# Patient Record
Sex: Male | Born: 1970 | Race: White | Hispanic: No | Marital: Single | Smoking: Current every day smoker
Health system: Southern US, Community
[De-identification: ages and names within clinical notes are randomized; demographics above are authoritative.]

## PROBLEM LIST (undated history)

## (undated) VITALS — BP 95/66 | HR 97 | Temp 97.3°F | Resp 16 | Ht 65.0 in | Wt 171.0 lb

## (undated) VITALS — BP 110/77 | HR 77 | Temp 98.2°F | Resp 16 | Ht 67.0 in | Wt 150.0 lb

## (undated) DIAGNOSIS — E349 Endocrine disorder, unspecified: Secondary | ICD-10-CM

## (undated) DIAGNOSIS — L03119 Cellulitis of unspecified part of limb: Secondary | ICD-10-CM

## (undated) DIAGNOSIS — F329 Major depressive disorder, single episode, unspecified: Secondary | ICD-10-CM

## (undated) DIAGNOSIS — F32A Depression, unspecified: Secondary | ICD-10-CM

## (undated) DIAGNOSIS — B1920 Unspecified viral hepatitis C without hepatic coma: Secondary | ICD-10-CM

## (undated) DIAGNOSIS — F640 Transsexualism: Secondary | ICD-10-CM

## (undated) DIAGNOSIS — L02419 Cutaneous abscess of limb, unspecified: Secondary | ICD-10-CM

---

## 1999-12-03 ENCOUNTER — Emergency Department (HOSPITAL_COMMUNITY): Admission: EM | Admit: 1999-12-03 | Discharge: 1999-12-03 | Payer: Self-pay | Admitting: Emergency Medicine

## 2006-01-12 ENCOUNTER — Ambulatory Visit: Payer: Self-pay | Admitting: *Deleted

## 2006-01-12 ENCOUNTER — Inpatient Hospital Stay (HOSPITAL_COMMUNITY): Admission: EM | Admit: 2006-01-12 | Discharge: 2006-01-18 | Payer: Self-pay | Admitting: *Deleted

## 2006-02-24 ENCOUNTER — Ambulatory Visit (HOSPITAL_COMMUNITY): Payer: Self-pay | Admitting: Psychiatry

## 2006-03-31 ENCOUNTER — Ambulatory Visit (HOSPITAL_COMMUNITY): Payer: Self-pay | Admitting: Psychiatry

## 2006-04-28 ENCOUNTER — Ambulatory Visit (HOSPITAL_COMMUNITY): Payer: Self-pay | Admitting: Psychiatry

## 2006-05-19 ENCOUNTER — Ambulatory Visit (HOSPITAL_COMMUNITY): Payer: Self-pay | Admitting: Psychiatry

## 2006-06-02 ENCOUNTER — Ambulatory Visit (HOSPITAL_COMMUNITY): Payer: Self-pay | Admitting: Psychiatry

## 2006-06-16 ENCOUNTER — Ambulatory Visit (HOSPITAL_COMMUNITY): Payer: Self-pay | Admitting: Psychiatry

## 2006-06-19 ENCOUNTER — Emergency Department (HOSPITAL_COMMUNITY): Admission: EM | Admit: 2006-06-19 | Discharge: 2006-06-19 | Payer: Self-pay | Admitting: Emergency Medicine

## 2006-06-23 ENCOUNTER — Ambulatory Visit (HOSPITAL_COMMUNITY): Payer: Self-pay | Admitting: Psychiatry

## 2006-07-01 ENCOUNTER — Inpatient Hospital Stay (HOSPITAL_COMMUNITY): Admission: AD | Admit: 2006-07-01 | Discharge: 2006-07-09 | Payer: Self-pay | Admitting: Psychiatry

## 2006-07-01 ENCOUNTER — Ambulatory Visit: Payer: Self-pay | Admitting: Psychiatry

## 2006-07-12 ENCOUNTER — Emergency Department (HOSPITAL_COMMUNITY): Admission: EM | Admit: 2006-07-12 | Discharge: 2006-07-13 | Payer: Self-pay | Admitting: Emergency Medicine

## 2006-07-14 ENCOUNTER — Ambulatory Visit (HOSPITAL_COMMUNITY): Payer: Self-pay | Admitting: Psychiatry

## 2006-07-28 ENCOUNTER — Ambulatory Visit (HOSPITAL_COMMUNITY): Payer: Self-pay | Admitting: Psychiatry

## 2006-09-01 ENCOUNTER — Ambulatory Visit (HOSPITAL_COMMUNITY): Payer: Self-pay | Admitting: Psychiatry

## 2006-12-08 ENCOUNTER — Ambulatory Visit (HOSPITAL_COMMUNITY): Payer: Self-pay | Admitting: Psychiatry

## 2007-02-23 ENCOUNTER — Ambulatory Visit (HOSPITAL_COMMUNITY): Payer: Self-pay | Admitting: Psychiatry

## 2007-03-16 ENCOUNTER — Ambulatory Visit (HOSPITAL_COMMUNITY): Payer: Self-pay | Admitting: Psychiatry

## 2007-04-09 ENCOUNTER — Inpatient Hospital Stay (HOSPITAL_COMMUNITY): Admission: EM | Admit: 2007-04-09 | Discharge: 2007-04-14 | Payer: Self-pay | Admitting: Psychiatry

## 2007-04-11 ENCOUNTER — Ambulatory Visit: Payer: Self-pay | Admitting: Psychiatry

## 2007-07-06 ENCOUNTER — Ambulatory Visit (HOSPITAL_COMMUNITY): Payer: Self-pay | Admitting: Psychiatry

## 2007-09-07 ENCOUNTER — Ambulatory Visit (HOSPITAL_COMMUNITY): Payer: Self-pay | Admitting: Psychiatry

## 2007-10-26 ENCOUNTER — Ambulatory Visit (HOSPITAL_COMMUNITY): Payer: Self-pay | Admitting: Psychiatry

## 2007-11-04 ENCOUNTER — Emergency Department (HOSPITAL_COMMUNITY): Admission: EM | Admit: 2007-11-04 | Discharge: 2007-11-05 | Payer: Self-pay | Admitting: Emergency Medicine

## 2008-02-22 ENCOUNTER — Ambulatory Visit (HOSPITAL_COMMUNITY): Payer: Self-pay | Admitting: Psychiatry

## 2008-05-16 ENCOUNTER — Ambulatory Visit (HOSPITAL_COMMUNITY): Payer: Self-pay | Admitting: Psychiatry

## 2008-07-18 ENCOUNTER — Ambulatory Visit (HOSPITAL_COMMUNITY): Payer: Self-pay | Admitting: Psychiatry

## 2008-09-12 ENCOUNTER — Ambulatory Visit (HOSPITAL_COMMUNITY): Payer: Self-pay | Admitting: Psychiatry

## 2008-11-16 ENCOUNTER — Emergency Department (HOSPITAL_COMMUNITY): Admission: EM | Admit: 2008-11-16 | Discharge: 2008-11-17 | Payer: Self-pay | Admitting: Emergency Medicine

## 2008-12-12 ENCOUNTER — Ambulatory Visit (HOSPITAL_COMMUNITY): Payer: Self-pay | Admitting: Psychiatry

## 2009-05-29 ENCOUNTER — Ambulatory Visit (HOSPITAL_COMMUNITY): Payer: Self-pay | Admitting: Psychiatry

## 2009-07-06 ENCOUNTER — Emergency Department (HOSPITAL_COMMUNITY)
Admission: EM | Admit: 2009-07-06 | Discharge: 2009-07-07 | Payer: Self-pay | Source: Home / Self Care | Admitting: Emergency Medicine

## 2009-08-03 ENCOUNTER — Emergency Department (HOSPITAL_COMMUNITY): Admission: EM | Admit: 2009-08-03 | Discharge: 2009-08-03 | Payer: Self-pay | Admitting: Family Medicine

## 2009-09-11 ENCOUNTER — Ambulatory Visit (HOSPITAL_COMMUNITY): Payer: Self-pay | Admitting: Psychiatry

## 2009-09-20 ENCOUNTER — Ambulatory Visit (HOSPITAL_COMMUNITY): Admission: RE | Admit: 2009-09-20 | Discharge: 2009-09-20 | Payer: Self-pay | Admitting: Psychiatry

## 2009-09-20 ENCOUNTER — Other Ambulatory Visit: Payer: Self-pay | Admitting: Emergency Medicine

## 2009-09-23 ENCOUNTER — Ambulatory Visit: Payer: Self-pay | Admitting: Psychiatry

## 2009-09-23 ENCOUNTER — Inpatient Hospital Stay (HOSPITAL_COMMUNITY)
Admission: AD | Admit: 2009-09-23 | Discharge: 2009-09-26 | Payer: Self-pay | Source: Home / Self Care | Admitting: Psychiatry

## 2009-10-09 ENCOUNTER — Ambulatory Visit (HOSPITAL_COMMUNITY): Payer: Self-pay | Admitting: Psychiatry

## 2009-11-14 ENCOUNTER — Other Ambulatory Visit: Payer: Self-pay

## 2009-11-14 ENCOUNTER — Inpatient Hospital Stay (HOSPITAL_COMMUNITY): Admission: RE | Admit: 2009-11-14 | Discharge: 2009-11-18 | Payer: Self-pay | Admitting: Psychiatry

## 2010-07-27 LAB — CBC
MCV: 93.9 fL (ref 78.0–100.0)
Platelets: 177 10*3/uL (ref 150–400)
RBC: 4.41 MIL/uL (ref 3.87–5.11)
RDW: 13.9 % (ref 11.5–15.5)
WBC: 10.3 10*3/uL (ref 4.0–10.5)

## 2010-07-27 LAB — COMPREHENSIVE METABOLIC PANEL
BUN: 7 mg/dL (ref 6–23)
CO2: 24 mEq/L (ref 19–32)
Calcium: 9 mg/dL (ref 8.4–10.5)
Creatinine, Ser: 1.08 mg/dL (ref 0.4–1.2)
GFR calc Af Amer: >60 mL/min (ref >60)
GFR calc non Af Amer: >60 mL/min (ref >60)
Glucose, Bld: 101 mg/dL — ABNORMAL HIGH (ref 70–99)
Sodium: 140 mEq/L (ref 135–145)

## 2010-07-27 LAB — DIFFERENTIAL
Eosinophils Absolute: 0.2 10*3/uL (ref 0.0–0.7)
Lymphocytes Relative: 22 % (ref 12–46)
Lymphs Abs: 2.2 10*3/uL (ref 0.7–4.0)
Neutro Abs: 7.6 10*3/uL (ref 1.7–7.7)
Neutrophils Relative %: 74 % (ref 43–77)

## 2010-07-27 LAB — RAPID URINE DRUG SCREEN, HOSP PERFORMED
Amphetamines: NOT DETECTED
Barbiturates: NOT DETECTED
Tetrahydrocannabinol: NOT DETECTED

## 2010-07-27 LAB — ETHANOL: Alcohol, Ethyl (B): <5 mg/dL (ref 0–10)

## 2010-07-29 LAB — BASIC METABOLIC PANEL
GFR calc non Af Amer: >60 mL/min (ref >60)
Glucose, Bld: 103 mg/dL — ABNORMAL HIGH (ref 70–99)
Potassium: 4.1 mEq/L (ref 3.5–5.1)
Sodium: 142 mEq/L (ref 135–145)

## 2010-07-29 LAB — URINALYSIS, ROUTINE W REFLEX MICROSCOPIC
Bilirubin Urine: NEGATIVE
Hgb urine dipstick: NEGATIVE
Specific Gravity, Urine: 1.016 (ref 1.005–1.030)
Urobilinogen, UA: 0.2 mg/dL (ref 0.0–1.0)

## 2010-07-29 LAB — DIFFERENTIAL
Eosinophils Relative: 2 % (ref 0–5)
Lymphocytes Relative: 26 % (ref 12–46)
Lymphs Abs: 2.1 10*3/uL (ref 0.7–4.0)
Monocytes Absolute: 0.4 10*3/uL (ref 0.1–1.0)
Monocytes Relative: 4 % (ref 3–12)

## 2010-07-29 LAB — ETHANOL: Alcohol, Ethyl (B): <5 mg/dL (ref 0–10)

## 2010-07-29 LAB — URINE MICROSCOPIC-ADD ON

## 2010-07-29 LAB — CBC
HCT: 43.2 % (ref 36.0–46.0)
Hemoglobin: 14.4 g/dL (ref 12.0–15.0)
RBC: 4.57 MIL/uL (ref 3.87–5.11)
WBC: 8.3 10*3/uL (ref 4.0–10.5)

## 2010-07-29 LAB — RAPID URINE DRUG SCREEN, HOSP PERFORMED
Amphetamines: NOT DETECTED
Barbiturates: NOT DETECTED
Opiates: NOT DETECTED

## 2010-08-01 LAB — HERPES SIMPLEX VIRUS CULTURE: Culture: NOT DETECTED

## 2010-08-01 LAB — GC/CHLAMYDIA PROBE AMP, URINE
Chlamydia, Swab/Urine, PCR: NEGATIVE
GC Probe Amp, Urine: NEGATIVE

## 2010-08-17 LAB — POCT I-STAT, CHEM 8
BUN: 8 mg/dL (ref 6–23)
HCT: 49 % — ABNORMAL HIGH (ref 36.0–46.0)
Sodium: 141 mEq/L (ref 135–145)
TCO2: 23 mmol/L (ref 0–100)

## 2010-08-17 LAB — DIFFERENTIAL
Basophils Absolute: 0.3 10*3/uL — ABNORMAL HIGH (ref 0.0–0.1)
Basophils Relative: 2 % — ABNORMAL HIGH (ref 0–1)
Eosinophils Relative: 0 % (ref 0–5)
Lymphocytes Relative: 8 % — ABNORMAL LOW (ref 12–46)
Neutro Abs: 16.4 10*3/uL — ABNORMAL HIGH (ref 1.7–7.7)

## 2010-08-17 LAB — RAPID URINE DRUG SCREEN, HOSP PERFORMED
Amphetamines: NOT DETECTED
Barbiturates: NOT DETECTED
Opiates: NOT DETECTED

## 2010-08-17 LAB — CBC
Platelets: 162 10*3/uL (ref 150–400)
RDW: 13.9 % (ref 11.5–15.5)

## 2010-09-23 NOTE — H&P (Signed)
Randy [name], Randy [name]                 ACCOUNT NO.:  [id number]   MEDICAL RECORD NO.:  [id number]          PATIENT TYPE:  IPS   LOCATION:  0407                          FACILITY:  [facility]   PHYSICIAN:  Anselm Jungling, MD  DATE OF BIRTH:  [birth year]   DATE OF ADMISSION:  04/09/2007  DATE OF DISCHARGE:                       PSYCHIATRIC ADMISSION ASSESSMENT   IDENTIFYING INFORMATION:  This is a voluntary admission to the services  of Dr. Geralyn Flash.  This is a 40 year old transgendered male.  Jurgen  presented as a walk-in yesterday.  Jie reported having come off  medications at least 2 weeks ago, except for Wellbutrin, on her own, due  to financial reasons.  Her father died this past 2022/10/16.  She came back  to [place] last night.  She reported having suicidal ideation with  plans to cut herself.  She had come home from visiting family for the  holiday and then became upset about family conflict.  She states that  she is also off of her estradiol.  She is followed by Dr. Elizabeth Sauer  in [place].  She reports having relapsed on drugs and alcohol after she  came back to town.  There is an upcoming memorial service this coming  Saturday in [place] for her father, however she has not been offered a  plane ticket.  She is pretty sure she will not be attending.  Marland Kitchen   PAST PSYCHIATRIC HISTORY:  The patient began psychiatric care around  40 as an inpatient in [place], [place], also that was her first  time at [facility] on the then-5000 unit.  She was also a patient in  1998 at [facility], 1999 at [facility], and she has had several  admissions this past year at [facility].  She  is followed as an outpatient by Dr. Geoffery Lyons.   SOCIAL HISTORY:  She reports having started SSDI in 2001.  She received  her B.S. from [employer] in 2006.  She has never married, has no children.  She is in a subsidized apartment and is on the Section 8 waiting list.   FAMILY  HISTORY:  She states her father had a lung transplant last year  and unfortunately he has just passed away.   MEDICAL HISTORY AND PRIMARY CARE Jeanne Diefendorf:  She was under hormonal  therapy for transgender with Dr. Elizabeth Sauer.  She states that she  stopped this a couple of months ago, and she also made mention of the  fact that Medicare cannot pay for the surgery.  Medical problems:  There  are no known medical problems.   MEDICATIONS:  She was prescribed Wellbutrin XL 450 mg p.o. daily,  Lexapro 30 mg p.o. daily, Abilify 5 mg p.o. daily, and Klonopin 0.75 mg  p.o. b.i.d.   ALLERGIES:  PENICILLIN.   POSITIVE PHYSICAL FINDINGS:  PHYSICAL EXAMINATION:  She has very thin  hair, although it is long, and her features are losing their  esterification.  Vital signs on admission show she is 66 inches tall and  weighs 192, temperature is 97.6, blood pressure is  117/79 to 116/74.  Pulse is 78-90, respirations are 20.  Unfortunately, labs are pending.  I do not have those results yet.   MENTAL STATUS EXAM:  She was easily aroused and brought down to the exam  room.  She is casually groomed and dressed, appears to be adequately  nourished.  Her speech is slow, it is a normal rate and volume.  Mood is  very depressed, affect is flat.  Thought processes are slow, poor eye  contact, did not seem to be having thought blocking however.  Judgment  and insight are fair.  Concentration and memory are fair.  Intelligence  is at least average.  She  is passively suicidal.  She is not sure if  she would be safe if outside of the current environment.  She is not  homicidal, does not have any auditory or visual hallucinations.   ADMISSION DIAGNOSES:  AXIS I:  Major depressive disorder, recurrent,  severe due to noncompliance with medications, relapse on alcohol and  crack cocaine.  AXIS II:  Deferred.  AXIS III:  Transgender process has been stopped, has discontinued  estradiol.  AXIS IV:  Problems with  primary support group, housing, economic issues.  AXIS V:  Global assessment of function is 30.   PLAN:  Plan is to admit for safety and stabilization, to restart her  medications.  We will get her hooked up with [facility] to ensure compliance, and also to help her with financial  planning, etc.   ESTIMATED LENGTH OF STAY:  3-4 days.      Mickie Leonarda Salon, P.A.-C.      Anselm Jungling, MD  Electronically Signed    MD/MEDQ  D:  04/10/2007  T:  04/10/2007  Job:  [id number]
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
## 2010-09-26 NOTE — Discharge Summary (Signed)
Randy [name], Randy [name]                 ACCOUNT NO.:  [id number]   MEDICAL RECORD NO.:  [id number]          PATIENT TYPE:  IPS   LOCATION:  0505                          FACILITY:  [facility]   PHYSICIAN:  Anselm Jungling, MD  DATE OF BIRTH:  06-21-1970   DATE OF ADMISSION:  04/09/2007  DATE OF DISCHARGE:  04/14/2007                               DISCHARGE SUMMARY   IDENTIFYING DATA AND REASON FOR ADMISSION:  This was an inpatient  psychiatric admission for Randy [name], a 40 year old transgender, male to  male.  She presented as a walk-in, and reported having come off of her  psychotropic medications 2 weeks prior to admission due to financial  reasons.  Also her father had died in the past week.  She reported  suicidal ideation.  Please refer to the admission note for further  details pertaining to the symptoms, circumstances and history that led  to her hospitalization.  She was given initial Axis I diagnoses of major  depressive disorder, recurrent, severe, alcohol and cocaine abuse, and  bereavement.   MEDICAL AND LABORATORY:  The patient came to [place] with a history of no  known medical problems.  She was undergoing hormonal therapy for  transgender transformation, under the care of Dr. Yetta Barre.   HOSPITAL COURSE:  The patient was admitted to the adult inpatient  psychiatric service.  She presented as a well-nourished, well-developed  individual, with overall male gestalt.  She was alert, fully oriented,  pleasant and cooperative.  Her mood was depressed with sad affect.  Her  thoughts and speech were normally organized.  There was nothing to  suggest any underlying formal thought disorder or psychosis.   The patient was initially seen by Dr. Milford Cage.  The undersigned  assumed her care on the fourth hospital day.  On that date, I met with  the patient individually and reviewed the chart.  At time she appeared  tired, disheveled, but had no new or specific complaints.  She was still  quite depressed, but made no suicidal statements.  She had not been  participating very well in the program.   The following day, however, she reported that she was feeling a lot  better.  She was being treated with a regimen of Lexapro, Abilify,  Wellbutrin XL, and Klonopin.  As referenced above, she had stopped her  psychotropic regimen  approximately 2 weeks prior to admission due to  financial reasons.   On the sixth hospital day, the patient requested discharge.  She was  visibly less depressed, was smiling appropriately, and much more  hopeful.  She had indicated that she did not have anyone in her life to  invite in for a family support meeting.  She agreed to the following  aftercare plan.   AFTERCARE:  The patient was to follow-up at the [facility] outpatient  clinic with an appointment on January 7.   DISCHARGE MEDICATIONS:  Lexapro 30 mg daily, Abilify 5 mg daily,  Wellbutrin XL 450 mg daily, Klonopin 0.5 mg, 1-1/2 tablets b.i.d., and  Tylenol 650 mg q.4 h as needed  for pain.  The patient was also referred  to [facility] for individual psychotherapy and given the number of the  [facility] for an additional mental health services.   DISCHARGE DIAGNOSES:  AXIS I: Bipolar disorder, type 2, most recently  depressed without psychotic features, history of alcohol and cocaine  abuse, and bereavement.  AXIS II: Deferred.  AXIS III: No acute or chronic illnesses, transgender transformative  process, medical in progress.  AXIS IV: Stressors severe.  AXIS V: GAF on discharge 55.      Anselm Jungling, MD  Electronically Signed     SPB/MEDQ  D:  04/28/2007  T:  04/29/2007  Job:  [id number]

## 2011-02-17 LAB — CBC
HCT: 46.6 — ABNORMAL HIGH
Hemoglobin: 16.3 — ABNORMAL HIGH
MCHC: 35
MCV: 93.9
RDW: 13.9

## 2011-02-17 LAB — COMPREHENSIVE METABOLIC PANEL
BUN: 16
CO2: 23
Calcium: 9.2
Chloride: 108
Creatinine, Ser: 1.17
GFR calc Af Amer: >60
GFR calc non Af Amer: 53 — ABNORMAL LOW
Glucose, Bld: 90
Total Bilirubin: 0.8

## 2011-05-24 ENCOUNTER — Ambulatory Visit (HOSPITAL_COMMUNITY)
Admission: RE | Admit: 2011-05-24 | Discharge: 2011-05-24 | Disposition: A | Discharge status: Home / Self Care | Payer: Medicare Other | Source: Ambulatory Visit | Attending: Psychiatry | Admitting: Psychiatry

## 2011-05-24 ENCOUNTER — Encounter (HOSPITAL_COMMUNITY): Payer: Self-pay | Admitting: Licensed Clinical Social Worker

## 2011-05-24 ENCOUNTER — Emergency Department (HOSPITAL_COMMUNITY)
Admission: EM | Admit: 2011-05-24 | Discharge: 2011-05-26 | Disposition: A | Discharge status: Other Acute Inpatient Hospital | Payer: Medicare Other | Attending: Internal Medicine | Admitting: Internal Medicine

## 2011-05-24 DIAGNOSIS — M7989 Other specified soft tissue disorders: Secondary | ICD-10-CM | POA: Diagnosis not present

## 2011-05-24 DIAGNOSIS — R45851 Suicidal ideations: Secondary | ICD-10-CM | POA: Diagnosis not present

## 2011-05-24 DIAGNOSIS — F1210 Cannabis abuse, uncomplicated: Secondary | ICD-10-CM | POA: Insufficient documentation

## 2011-05-24 DIAGNOSIS — F1010 Alcohol abuse, uncomplicated: Secondary | ICD-10-CM | POA: Insufficient documentation

## 2011-05-24 DIAGNOSIS — F329 Major depressive disorder, single episode, unspecified: Secondary | ICD-10-CM | POA: Insufficient documentation

## 2011-05-24 DIAGNOSIS — Z79899 Other long term (current) drug therapy: Secondary | ICD-10-CM | POA: Insufficient documentation

## 2011-05-24 DIAGNOSIS — F1410 Cocaine abuse, uncomplicated: Secondary | ICD-10-CM | POA: Insufficient documentation

## 2011-05-24 DIAGNOSIS — M79609 Pain in unspecified limb: Secondary | ICD-10-CM | POA: Diagnosis not present

## 2011-05-24 DIAGNOSIS — S9030XA Contusion of unspecified foot, initial encounter: Secondary | ICD-10-CM | POA: Diagnosis not present

## 2011-05-24 DIAGNOSIS — F3289 Other specified depressive episodes: Secondary | ICD-10-CM | POA: Insufficient documentation

## 2011-05-24 DIAGNOSIS — F17200 Nicotine dependence, unspecified, uncomplicated: Secondary | ICD-10-CM | POA: Insufficient documentation

## 2011-05-24 DIAGNOSIS — F332 Major depressive disorder, recurrent severe without psychotic features: Secondary | ICD-10-CM | POA: Insufficient documentation

## 2011-05-24 DIAGNOSIS — S9031XA Contusion of right foot, initial encounter: Secondary | ICD-10-CM

## 2011-05-24 DIAGNOSIS — Z7721 Contact with and (suspected) exposure to potentially hazardous body fluids: Secondary | ICD-10-CM | POA: Insufficient documentation

## 2011-05-24 HISTORY — DX: Major depressive disorder, single episode, unspecified: F32.9

## 2011-05-24 HISTORY — DX: Depression, unspecified: F32.A
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
## 2011-05-25 ENCOUNTER — Emergency Department (HOSPITAL_COMMUNITY): Payer: Medicare Other

## 2011-05-25 ENCOUNTER — Encounter (HOSPITAL_COMMUNITY): Payer: Self-pay

## 2011-05-25 DIAGNOSIS — M79609 Pain in unspecified limb: Secondary | ICD-10-CM | POA: Diagnosis not present

## 2011-05-25 DIAGNOSIS — M7989 Other specified soft tissue disorders: Secondary | ICD-10-CM | POA: Diagnosis not present

## 2011-05-25 LAB — COMPREHENSIVE METABOLIC PANEL
Albumin: 3.5 g/dL (ref 3.5–5.2)
Alkaline Phosphatase: 101 U/L (ref 39–117)
BUN: 10 mg/dL (ref 6–23)
CO2: 25 mEq/L (ref 19–32)
Chloride: 102 mEq/L (ref 96–112)
Creatinine, Ser: 1.28 mg/dL (ref 0.50–1.35)
GFR calc Af Amer: 80 mL/min — ABNORMAL LOW (ref >90)
GFR calc non Af Amer: 69 mL/min — ABNORMAL LOW (ref >90)
Glucose, Bld: 88 mg/dL (ref 70–99)
Potassium: 4.1 mEq/L (ref 3.5–5.1)
Total Bilirubin: 0.4 mg/dL (ref 0.3–1.2)

## 2011-05-25 LAB — CBC
HCT: 49.5 % (ref 39.0–52.0)
Hemoglobin: 16.6 g/dL (ref 13.0–17.0)
MCV: 94.1 fL (ref 78.0–100.0)
RDW: 15.1 % (ref 11.5–15.5)
WBC: 8.2 10*3/uL (ref 4.0–10.5)

## 2011-05-25 LAB — PROTIME-INR
INR: 1 (ref 0.00–1.49)
Prothrombin Time: 13.4 seconds (ref 11.6–15.2)

## 2011-05-25 LAB — ETHANOL: Alcohol, Ethyl (B): <11 mg/dL (ref 0–11)

## 2011-05-25 LAB — HEPATITIS PANEL, ACUTE
HCV Ab: REACTIVE — AB
Hep A IgM: NEGATIVE
Hep B C IgM: NEGATIVE
Hepatitis B Surface Ag: NEGATIVE

## 2011-05-25 LAB — RAPID HIV SCREEN (WH-MAU): Rapid HIV Screen: NONREACTIVE

## 2011-05-25 LAB — APTT: aPTT: 32 seconds (ref 24–37)

## 2011-05-25 MED ORDER — ACETAMINOPHEN 325 MG PO TABS: 650.0000 mg | ORAL_TABLET | ORAL | Status: DC | PRN

## 2011-05-25 MED ORDER — ONDANSETRON HCL 4 MG PO TABS: 4.0000 mg | ORAL_TABLET | Freq: Three times a day (TID) | ORAL | Status: DC | PRN

## 2011-05-25 MED ORDER — ALUM & MAG HYDROXIDE-SIMETH 200-200-20 MG/5ML PO SUSP: 30.0000 mL | ORAL | Status: DC | PRN

## 2011-05-25 MED ORDER — NICOTINE 21 MG/24HR TD PT24
21.0000 mg | MEDICATED_PATCH | Freq: Every day | TRANSDERMAL | Status: DC
  Administered 2011-05-25 – 2011-05-26 (×2): 21 mg via TRANSDERMAL
  Filled 2011-05-25 (×2): qty 1

## 2011-05-25 MED ORDER — IBUPROFEN 600 MG PO TABS: 600.0000 mg | ORAL_TABLET | Freq: Three times a day (TID) | ORAL | Status: DC | PRN

## 2011-05-25 NOTE — ED Provider Notes (Signed)
Patient's LFTs noted to be significantly elevated. Last LFTs documented for July 2011 and were normal. Discussed this with patient admits to drinking daily alcohol and using cocaine and marijuana. Also states that he recently ended a relationship with someone but admitted to being hepatitis C positive, so patient is certain he's been at least exposed to hepatitis C. Discussed these findings with patient and the fact that we would be doing further blood tests to further evaluate this finding. Patient request HIV screening as well. Will transfer patient to TCU for further lab testing and ultimate evaluation back pain for his suicidal ideations.  Randy Kayser [name], NP 05/25/11 [phone]
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
## 2011-05-25 NOTE — ED Provider Notes (Signed)
I have discussed this patient with "PAige" w/ Act Team who is aware the patient will need evaluation for suicidal ideations. I have also discussed patient with Dr. Hyacinth Meeker who is aware of plan and is agreeable.  Roma Kayser Jakiah Bienaime, NP 05/25/11 [phone]

## 2011-05-25 NOTE — ED Provider Notes (Addendum)
History     CSN: [id number]  Arrival date & time 05/24/11  2330   First MD Initiated Contact with Patient 05/25/11 0011      Chief Complaint  Patient presents with  . Foot Pain    right  . Medical Clearance  . Suicidal    (Consider location/radiation/quality/duration/timing/severity/associated sxs/prior treatment) Patient is a 41 y.o. male presenting with lower extremity pain. The history is provided by the patient.  Foot Pain This is a new problem. The current episode started in the past 7 days. The problem occurs constantly. The problem has been unchanged. Associated symptoms include joint swelling. Pertinent negatives include no abdominal pain, chest pain, chills, coughing, fever, headaches, nausea, neck pain, numbness, rash, sore throat, vomiting or weakness. Associated symptoms comments: Swelling. The symptoms are aggravated by walking. He has tried nothing for the symptoms.  Kicked a sofa on Friday, pain to right mid and hindfoot since that time with reported swelling and pain with ambulation.   Pt also reports hx of depression with SI for the last few weeks after ending an intimate relationship. Does have a plan to cut himself. Denies HI or hallucinations. Denies any overdose attempt. Admits to use of multiple recreational drugs. Admits to alcohol use of approx 4-5 beers daily recently- denies any hx of alcohol withdrawal or tremors. Last ETOH yesterday afternoon. Reports taking all medications (including psych meds) as directed.  Past Medical History  Diagnosis Date  . Depression     No past surgical history on file.  No family history on file.  History  Substance Use Topics  . Smoking status: Current Everyday Smoker -- 1.5 packs/day for 20 years  . Smokeless tobacco: Not on file  . Alcohol Use: 24.0 oz/week    40 Cans of beer per week      Review of Systems  Constitutional: Negative for fever and chills.  HENT: Negative for ear pain, sore throat, neck pain and  neck stiffness.   Eyes: Negative for pain and visual disturbance.  Respiratory: Negative for cough and shortness of breath.   Cardiovascular: Negative for chest pain.  Gastrointestinal: Negative for nausea, vomiting and abdominal pain.  Genitourinary: Negative for dysuria, hematuria and flank pain.  Musculoskeletal: Positive for joint swelling and gait problem. Negative for back pain.  Skin: Negative for rash and wound.  Neurological: Negative for dizziness, syncope, weakness, numbness and headaches.  Psychiatric/Behavioral: Positive for suicidal ideas and sleep disturbance. Negative for hallucinations, self-injury and agitation.    Allergies  Penicillins  Home Medications   Current Outpatient Rx  Name Route Sig Dispense Refill  . ESTRADIOL 2 MG PO TABS Oral Take 2 mg by mouth 2 (two) times daily.    . ARIPIPRAZOLE 5 MG PO TABS Oral Take 5 mg by mouth daily.    . BUPROPION HCL ER (XL) 300 MG PO TB24 Oral Take 300 mg by mouth daily.    Marland Kitchen CLONAZEPAM 1 MG PO TABS Oral Take 1 mg by mouth 2 (two) times daily as needed.    Marland Kitchen ESCITALOPRAM OXALATE 20 MG PO TABS Oral Take 20 mg by mouth daily.    Marland Kitchen FINASTERIDE 5 MG PO TABS Oral Take 5 mg by mouth daily.    Marland Kitchen SPIRONOLACTONE 50 MG PO TABS Oral Take 50 mg by mouth daily.      BP 104/62  Pulse 86  Temp(Src) 99 F (37.2 C) (Oral)  Resp 16  Ht 5\' 6"  (1.676 m)  Wt 150 lb 11 oz (68.351  kg)  BMI 24.32 kg/m2  SpO2 99%  Physical Exam  Nursing note and vitals reviewed. Constitutional: He is oriented to person, place, and time. He appears well-developed and well-nourished.       Disheveled   HENT:  Head: Normocephalic and atraumatic.  Right Ear: External ear normal.  Left Ear: External ear normal.  Nose: Nose normal.       Mucous membranes moist  Eyes: Pupils are equal, round, and reactive to light.  Neck: Normal range of motion. Neck supple.  Cardiovascular: Normal rate and regular rhythm.   Pulmonary/Chest: Effort normal and breath  sounds normal. No respiratory distress.  Abdominal: Soft. Bowel sounds are normal. He exhibits no distension. There is no tenderness.  Musculoskeletal: Normal range of motion. He exhibits no tenderness.       Mild swelling to right medial mid-foot without TTP.   Neurological: He is alert and oriented to person, place, and time. No cranial nerve deficit.  Skin: Skin is warm and dry. No rash noted.  Psychiatric: He is withdrawn. Thought content is not paranoid. He exhibits a depressed mood. He expresses suicidal ideation. He expresses no homicidal ideation. He expresses suicidal plans. He expresses no homicidal plans.    ED Course  Procedures (including critical care time)   Labs Reviewed  CBC  COMPREHENSIVE METABOLIC PANEL  ETHANOL  URINE RAPID DRUG SCREEN (HOSP PERFORMED)   No results found.     MDM  SI with plan. Awaiting Med clearance labs.   Right foot pain. Awaiting x-ray.  Care discussed with NP Schorr who will follow-up with x-ray and lab results and consult ACT team.        Shaaron Adler, PA 05/25/11 0131  Shaaron Adler, [place] 05/27/11 [phone]

## 2011-05-25 NOTE — ED Notes (Signed)
CSW spoke with CeCe at [facility] who confirmed pt information received, however there are no beds available at this time. Pt information to be reviewed again at [facility] on 05/26/11. 

## 2011-05-25 NOTE — Discharge Planning (Signed)
CSW faxed patient's information to [facility] for review. Pending disposition.  Ileene Hutchinson , MSW, LCSWA 05/25/2011 1:19 PM [phone]

## 2011-05-25 NOTE — ED Notes (Signed)
Report received from night RN. First contact with patient. Pt is sleeping in room, woke up to meet this RN and went back to sleep. Pt reports the SI still present, no specific plan at the moment. Denied previous attempt. Pt is waiting to be seem by ACT team.

## 2011-05-25 NOTE — ED Notes (Signed)
IVC PAPERS SERVED BY [employer] OFFICER, [facility]

## 2011-05-25 NOTE — ED Notes (Signed)
Offered patient a shower. Patient declining at this time. Encouraged patient to let me know when he changed his mind.

## 2011-05-25 NOTE — ED Notes (Signed)
Pt was declined at [facility] by Dr. Les Pou due to acuity.

## 2011-05-26 LAB — RAPID URINE DRUG SCREEN, HOSP PERFORMED
Amphetamines: NOT DETECTED
Benzodiazepines: NOT DETECTED
Opiates: NOT DETECTED

## 2011-05-26 NOTE — ED Notes (Signed)
Up to the desk on the phone 

## 2011-05-26 NOTE — ED Provider Notes (Signed)
Medical screening examination/treatment/procedure(s) were performed by non-physician practitioner and as supervising physician I was immediately available for consultation/collaboration.   Daisi Kentner D Pasqualina Colasurdo, MD 05/26/11 0447 

## 2011-05-26 NOTE — ED Notes (Signed)
Pt is aware that we need  A urine specimen

## 2011-05-26 NOTE — BH Assessment (Signed)
Pt referred to [facility] for in-pt treatment. Patient accepted to [facility] per Lynn-staff. The accepting physician is Dr. Chauncey Cruel. The call report # is [phone]. Pt's nurse-Janie was made aware of patients disposition and called report. Patient will need transport by [employer] and Wille Celeste will make the appropriate arrangements.   **Prior to patient discharging from [facility] she needs a UDS completed. The results must be faxed to [phone] prior to patient discharging from [facility]. Also, a phone call needs to be made to [facility] making them aware that the results have been completed and faxed. Their contact number is [phone] opt1 and extension [phone].

## 2011-05-26 NOTE — ED Provider Notes (Signed)
The patient has been accepted at [facility] by Dr. Jeannine Kitten.  Dione Booze, MD 05/26/11 1325

## 2011-05-26 NOTE — ED Provider Notes (Signed)
Medical screening examination/treatment/procedure(s) were performed by non-physician practitioner and as supervising physician I was immediately available for consultation/collaboration.   Willella Harding D Keia Rask, MD 05/26/11 0447 

## 2011-05-26 NOTE — BH Assessment (Signed)
Writer contacted [facility] and made them aware that pt's UDS was completed and a copy was faxed to their facility.

## 2011-05-26 NOTE — ED Provider Notes (Signed)
Currently sleeping and without complaints. Awaiting psychiatric placement.  Dione Booze, MD 05/26/11 [phone]

## 2011-05-26 NOTE — ED Notes (Signed)
High pt unable to take report yet- will call back

## 2011-05-26 NOTE — ED Provider Notes (Signed)
Medical screening examination/treatment/procedure(s) were performed by non-physician practitioner and as supervising physician I was immediately available for consultation/collaboration.   Vida Roller, MD 05/26/11 [phone]

## 2011-05-26 NOTE — ED Notes (Signed)
Patient up to restroom at this time

## 2011-05-26 NOTE — ED Notes (Signed)
[employer] here to transport-Ruthie RN at [facility] updated w/ results and pending transfer.  1 Off white shoulder bag, 1 pt belongings bag sent w/ pt

## 2011-05-26 NOTE — ED Notes (Signed)
[employer] contacted for transport 

## 2011-05-27 DIAGNOSIS — F332 Major depressive disorder, recurrent severe without psychotic features: Secondary | ICD-10-CM | POA: Diagnosis not present

## 2011-05-27 NOTE — ED Notes (Signed)
+   Hep AB. [employer] faxed.

## 2011-05-28 DIAGNOSIS — F332 Major depressive disorder, recurrent severe without psychotic features: Secondary | ICD-10-CM | POA: Diagnosis not present

## 2011-05-29 DIAGNOSIS — F332 Major depressive disorder, recurrent severe without psychotic features: Secondary | ICD-10-CM | POA: Diagnosis not present

## 2011-06-01 NOTE — ED Provider Notes (Signed)
Medical screening examination/treatment/procedure(s) were performed by non-physician practitioner and as supervising physician I was immediately available for consultation/collaboration.   Vida Roller, MD 06/01/11 0000

## 2011-06-23 DIAGNOSIS — Z23 Encounter for immunization: Secondary | ICD-10-CM | POA: Diagnosis not present

## 2011-06-23 DIAGNOSIS — B1710 Acute hepatitis C without hepatic coma: Secondary | ICD-10-CM | POA: Diagnosis not present

## 2011-07-14 DIAGNOSIS — F0630 Mood disorder due to known physiological condition, unspecified: Secondary | ICD-10-CM | POA: Diagnosis not present

## 2011-07-21 DIAGNOSIS — Z23 Encounter for immunization: Secondary | ICD-10-CM | POA: Diagnosis not present

## 2011-07-21 DIAGNOSIS — B1920 Unspecified viral hepatitis C without hepatic coma: Secondary | ICD-10-CM | POA: Diagnosis not present

## 2011-08-13 ENCOUNTER — Emergency Department (HOSPITAL_COMMUNITY)
Admission: EM | Admit: 2011-08-13 | Discharge: 2011-08-13 | Disposition: A | Discharge status: Other Acute Inpatient Hospital | Payer: Medicare Other | Source: Home / Self Care | Attending: Emergency Medicine | Admitting: Emergency Medicine

## 2011-08-13 ENCOUNTER — Encounter (HOSPITAL_COMMUNITY): Payer: Self-pay

## 2011-08-13 ENCOUNTER — Inpatient Hospital Stay (HOSPITAL_COMMUNITY)
Admission: RE | Admit: 2011-08-13 | Discharge: 2011-08-17 | DRG: 885 | Disposition: A | Discharge status: Home / Self Care | Payer: Medicare Other | Attending: Psychiatry | Admitting: Psychiatry

## 2011-08-13 DIAGNOSIS — F419 Anxiety disorder, unspecified: Secondary | ICD-10-CM | POA: Diagnosis present

## 2011-08-13 DIAGNOSIS — F3289 Other specified depressive episodes: Secondary | ICD-10-CM | POA: Insufficient documentation

## 2011-08-13 DIAGNOSIS — F1410 Cocaine abuse, uncomplicated: Secondary | ICD-10-CM | POA: Diagnosis present

## 2011-08-13 DIAGNOSIS — B1920 Unspecified viral hepatitis C without hepatic coma: Secondary | ICD-10-CM | POA: Diagnosis present

## 2011-08-13 DIAGNOSIS — F329 Major depressive disorder, single episode, unspecified: Secondary | ICD-10-CM | POA: Insufficient documentation

## 2011-08-13 DIAGNOSIS — F332 Major depressive disorder, recurrent severe without psychotic features: Principal | ICD-10-CM | POA: Diagnosis present

## 2011-08-13 DIAGNOSIS — F32A Depression, unspecified: Secondary | ICD-10-CM

## 2011-08-13 DIAGNOSIS — G4710 Hypersomnia, unspecified: Secondary | ICD-10-CM | POA: Diagnosis present

## 2011-08-13 DIAGNOSIS — R45851 Suicidal ideations: Secondary | ICD-10-CM

## 2011-08-13 DIAGNOSIS — F411 Generalized anxiety disorder: Secondary | ICD-10-CM | POA: Diagnosis present

## 2011-08-13 DIAGNOSIS — Z8619 Personal history of other infectious and parasitic diseases: Secondary | ICD-10-CM | POA: Insufficient documentation

## 2011-08-13 HISTORY — DX: Unspecified viral hepatitis C without hepatic coma: B19.20

## 2011-08-13 LAB — COMPREHENSIVE METABOLIC PANEL
ALT: 275 U/L — ABNORMAL HIGH (ref 0–53)
Albumin: 3.8 g/dL (ref 3.5–5.2)
Alkaline Phosphatase: 96 U/L (ref 39–117)
Calcium: 9.8 mg/dL (ref 8.4–10.5)
GFR calc Af Amer: >90 mL/min (ref >90)
Glucose, Bld: 110 mg/dL — ABNORMAL HIGH (ref 70–99)
Potassium: 3.5 mEq/L (ref 3.5–5.1)
Sodium: 138 mEq/L (ref 135–145)
Total Protein: 7.2 g/dL (ref 6.0–8.3)

## 2011-08-13 LAB — RAPID URINE DRUG SCREEN, HOSP PERFORMED
Amphetamines: NOT DETECTED
Barbiturates: NOT DETECTED
Benzodiazepines: NOT DETECTED
Cocaine: POSITIVE — AB

## 2011-08-13 LAB — CBC
Hemoglobin: 16.1 g/dL (ref 13.0–17.0)
MCH: 31.8 pg (ref 26.0–34.0)
MCHC: 33.8 g/dL (ref 30.0–36.0)
Platelets: 234 10*3/uL (ref 150–400)
RDW: 13.2 % (ref 11.5–15.5)

## 2011-08-13 MED ORDER — ESCITALOPRAM OXALATE 20 MG PO TABS
20.0000 mg | ORAL_TABLET | Freq: Every day | ORAL | Status: DC
  Administered 2011-08-14 – 2011-08-17 (×4): 20 mg via ORAL
  Filled 2011-08-13: qty 14
  Filled 2011-08-13 (×5): qty 1

## 2011-08-13 MED ORDER — LORAZEPAM 1 MG PO TABS: 1.0000 mg | ORAL_TABLET | Freq: Three times a day (TID) | ORAL | Status: DC | PRN

## 2011-08-13 MED ORDER — ESTRADIOL 1 MG PO TABS
2.0000 mg | ORAL_TABLET | Freq: Two times a day (BID) | ORAL | Status: DC
  Administered 2011-08-14 – 2011-08-17 (×8): 2 mg via ORAL
  Filled 2011-08-13 (×5): qty 1
  Filled 2011-08-13 (×2): qty 14
  Filled 2011-08-13 (×4): qty 1

## 2011-08-13 MED ORDER — SPIRONOLACTONE 100 MG PO TABS
50.0000 mg | ORAL_TABLET | Freq: Every day | ORAL | Status: DC
  Administered 2011-08-14 – 2011-08-17 (×4): 50 mg via ORAL
  Filled 2011-08-13: qty 4
  Filled 2011-08-13 (×5): qty 1

## 2011-08-13 MED ORDER — FINASTERIDE 5 MG PO TABS
5.0000 mg | ORAL_TABLET | Freq: Every day | ORAL | Status: DC
  Administered 2011-08-14 – 2011-08-17 (×4): 5 mg via ORAL
  Filled 2011-08-13: qty 7
  Filled 2011-08-13 (×5): qty 1

## 2011-08-13 MED ORDER — BUPROPION HCL ER (XL) 300 MG PO TB24
300.0000 mg | ORAL_TABLET | Freq: Every day | ORAL | Status: DC
  Administered 2011-08-14 – 2011-08-17 (×4): 300 mg via ORAL
  Filled 2011-08-13: qty 1
  Filled 2011-08-13: qty 14
  Filled 2011-08-13 (×4): qty 1

## 2011-08-13 MED ORDER — ARIPIPRAZOLE 5 MG PO TABS
5.0000 mg | ORAL_TABLET | Freq: Every day | ORAL | Status: DC
  Administered 2011-08-14 – 2011-08-17 (×4): 5 mg via ORAL
  Filled 2011-08-13 (×3): qty 1
  Filled 2011-08-13: qty 14
  Filled 2011-08-13 (×2): qty 1

## 2011-08-13 MED ORDER — ALUM & MAG HYDROXIDE-SIMETH 200-200-20 MG/5ML PO SUSP: 30.0000 mL | ORAL | Status: DC | PRN

## 2011-08-13 MED ORDER — HYDROXYZINE HCL 25 MG PO TABS: 25.0000 mg | ORAL_TABLET | Freq: Three times a day (TID) | ORAL | Status: DC | PRN

## 2011-08-13 MED ORDER — ACETAMINOPHEN 325 MG PO TABS
650.0000 mg | ORAL_TABLET | Freq: Four times a day (QID) | ORAL | Status: DC | PRN
  Administered 2011-08-15: 650 mg via ORAL

## 2011-08-13 MED ORDER — IBUPROFEN 600 MG PO TABS: 600.0000 mg | ORAL_TABLET | Freq: Three times a day (TID) | ORAL | Status: DC | PRN

## 2011-08-13 MED ORDER — HYDROXYZINE HCL 50 MG PO TABS: 50.0000 mg | ORAL_TABLET | Freq: Every evening | ORAL | Status: DC | PRN

## 2011-08-13 MED ORDER — MAGNESIUM HYDROXIDE 400 MG/5ML PO SUSP: 30.0000 mL | Freq: Every day | ORAL | Status: DC | PRN

## 2011-08-13 MED ORDER — HYDROXYZINE HCL 25 MG PO TABS
25.0000 mg | ORAL_TABLET | ORAL | Status: DC | PRN
  Administered 2011-08-15: 25 mg via ORAL

## 2011-08-13 MED ORDER — NICOTINE 14 MG/24HR TD PT24
14.0000 mg | MEDICATED_PATCH | Freq: Every day | TRANSDERMAL | Status: DC
  Administered 2011-08-14 – 2011-08-16 (×3): 14 mg via TRANSDERMAL
  Filled 2011-08-13 (×7): qty 1

## 2011-08-13 NOTE — ED Provider Notes (Addendum)
History     CSN: [id number]  Arrival date & time 08/13/11  0506   First MD Initiated Contact with Patient 08/13/11 [phone]      Chief Complaint  Patient presents with  . Depression    (Consider location/radiation/quality/duration/timing/severity/associated sxs/prior treatment) HPI Comments: Patient reports a long-standing history of depression. He reports some as well as worsening of his depression about one week ago which coincides with him and cocaine again yesterday. Reports he is having increased thoughts of self-harm as well as suicide without any concrete plan. He has no prior history of suicide. Patient is also transgender but has not yet had any surgical procedures. He reports that his significant other is currently in rehabilitation but has not seen him in a while. He reports he went to behavioral health earlier this morning and they sent him here for medical clearance. The patient's nurse here in emergency Department who informs me that behavioral health and had informs the triage nurse that there were no beds available over there currently. Denies any physical symptoms, no chest pain, shortness of breath, fevers, vomiting or diarrhea. He denies any abdominal or back pain. He apparently decreased appetite over the last week.  The history is provided by the patient and medical records.    Past Medical History  Diagnosis Date  . Depression   . Hepatitis C     History reviewed. No pertinent past surgical history.  History reviewed. No pertinent family history.  History  Substance Use Topics  . Smoking status: Current Everyday Smoker -- 1.5 packs/day for 20 years  . Smokeless tobacco: Not on file  . Alcohol Use: 24.0 oz/week    40 Cans of beer per week      Review of Systems  Constitutional: Positive for appetite change.  Respiratory: Negative for cough and shortness of breath.   Cardiovascular: Negative for chest pain.  Gastrointestinal: Negative for nausea, vomiting,  abdominal pain and diarrhea.  Psychiatric/Behavioral: Positive for behavioral problems and self-injury.  All other systems reviewed and are negative.    Allergies  Penicillins  Home Medications   Current Outpatient Rx  Name Route Sig Dispense Refill  . ARIPIPRAZOLE 5 MG PO TABS Oral Take 5 mg by mouth daily.    . BUPROPION HCL ER (XL) 150 MG PO TB24 Oral Take 300 mg by mouth daily.    Marland Kitchen CLONAZEPAM 0.5 MG PO TABS Oral Take 1.5 mg by mouth 2 (two) times daily.    Marland Kitchen ESCITALOPRAM OXALATE 20 MG PO TABS Oral Take 20 mg by mouth daily.    Marland Kitchen ESTRADIOL 2 MG PO TABS Oral Take 2 mg by mouth 2 (two) times daily.    Marland Kitchen FINASTERIDE 5 MG PO TABS Oral Take 5 mg by mouth daily.    Marland Kitchen SPIRONOLACTONE 100 MG PO TABS Oral Take 50 mg by mouth daily.      BP 126/78  Pulse 86  Temp(Src) 98.4 F (36.9 C) (Oral)  Resp 18  SpO2 99%  Physical Exam  Nursing note and vitals reviewed. Constitutional: He is oriented to person, place, and time. He appears well-developed and well-nourished.  HENT:  Head: Normocephalic and atraumatic.  Eyes: Conjunctivae are normal. Pupils are equal, round, and reactive to light.  Abdominal: Soft. Normal appearance and bowel sounds are normal. He exhibits no distension. There is no tenderness.  Neurological: He is alert and oriented to person, place, and time.  Skin: Skin is warm and dry.  Psychiatric: His speech is delayed. He is  slowed. He is not agitated, not aggressive, not actively hallucinating and not combative. Cognition and memory are not impaired. He does not express impulsivity or inappropriate judgment. He exhibits a depressed mood. He expresses suicidal ideation. He expresses no suicidal plans. He is attentive.    ED Course  Procedures (including critical care time)  Labs Reviewed  CBC - Abnormal; Notable for the following:    WBC 10.6 (*)    All other components within normal limits  COMPREHENSIVE METABOLIC PANEL - Abnormal; Notable for the following:     Glucose, Bld 110 (*)    AST 113 (*)    ALT 275 (*)    GFR calc non Af Amer 79 (*)    All other components within normal limits  URINE RAPID DRUG SCREEN (HOSP PERFORMED) - Abnormal; Notable for the following:    Cocaine POSITIVE (*)    All other components within normal limits  ETHANOL   No results found.   1. Cocaine abuse   2. Depression     8:12 AM Spoke to [facility] with ACT who will see pt.  9:26 AM Pt accepted at [facility] by Dr. Bryson Corona  MDM  Will get telepsych since no beds available at [facility].  Will ask for medication recommendations to get him started.  Will also consult ACT for follow up recommendations.  Medically cleared.  Depression, drug abuse, some SI without discrete plan.  He came here voluntarily.          Gavin Pound. Oletta Lamas, MD 08/13/11 1610  Gavin Pound. Oletta Lamas, MD 08/13/11 [phone]

## 2011-08-13 NOTE — BH Assessment (Signed)
Assessment Note   Randy [name] is an 41 y.o. male who presented to this facility as a walk-in complaining of depression and suicidal thoughts along with substance abuse. Patient reports that he has been seeking treatment for his depression, was here several times in the past and was recently discharged from [facility] for the same problem. Patient feels like hurting himself. Patient reports that his main stressor is the fact that her boyfriend left her and told her that he does not want to be with her anymore. 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  Patient admits that he has not been compliant with medications. Was supposed to be on Abilify but never took it. Pt is also on Welbutrin and Lexapro. This is  a male-looking patient who has gone through trans-gender process and prefers to be considered as a lady. Randy [name] was referred to [facility] for medical clearance per Jorje Guild, PA. Will potentially be admitted to [facility] after clearance.   Axis I: Major Depression, Recurrent severe Axis II: Deferred Axis III: Hepatitis C  Axis IV: economic problems, occupational problems, problems related to social environment and problems with primary support group Axis V: 11-20 some danger of hurting self or others possible OR occasionally fails to maintain minimal personal hygiene OR gross impairment in communication  Past Medical History:  Past Medical History  Diagnosis Date  . Depression     History reviewed. No pertinent past surgical history.  Family History: History reviewed. No pertinent family history.  Social History:  reports that he has been smoking.  He does not have any smokeless tobacco history on file. He reports that he drinks about 24 ounces of alcohol per week. He reports that he uses illicit drugs (Cocaine and Marijuana) about 7 times per week.  Additional Social History:  Patient has a history of  drug abuse particularly smoking marijuana Allergies:  Allergies  Allergen Reactions  . Penicillins     Swollen joints     Home Medications:  No current facility-administered medications on file as of 08/13/2011.   Medications Prior to Admission  Medication Sig Dispense Refill  . ARIPiprazole (ABILIFY) 5 MG tablet Take 5 mg by mouth daily.      Marland Kitchen buPROPion (WELLBUTRIN XL) 150 MG 24 hr tablet Take 300 mg by mouth daily.      . clonazePAM (KLONOPIN) 0.5 MG tablet Take 1.5 mg by mouth 2 (two) times daily.      Marland Kitchen escitalopram (LEXAPRO) 20 MG tablet Take 20 mg by mouth daily.      Marland Kitchen estradiol (ESTRACE) 2 MG tablet Take 2 mg by mouth 2 (two) times daily.      . finasteride (PROSCAR) 5 MG tablet Take 5 mg by mouth daily.      Marland Kitchen spironolactone (ALDACTONE) 100 MG tablet Take 50 mg by mouth daily.        OB/GYN Status:  No LMP for male patient.  General Assessment Data Location of Assessment: [facility] Living Arrangements: Alone Can pt return to current living arrangement?: Yes Admission Status: Voluntary Is patient capable of signing voluntary admission?: Yes Transfer from: Home Referral Source: Self/Family/Friend  Education Status Is patient currently in school?: No Current Grade: na Highest grade of school patient has completed: unknown Name of school: na Contact person: Not listed  Risk to self Suicidal Ideation: Yes-Currently Present Suicidal Intent: Yes-Currently Present Is patient at risk for suicide?:  Yes Suicidal Plan?: Yes-Currently Present Specify Current Suicidal Plan: see comment (pt currently does not know how to kill self but wants to) Access to Means: No What has been your use of drugs/alcohol within the last 12 months?: actively use (pt admits usind drugs) Previous Attempts/Gestures: Yes How many times?: 3  Other Self Harm Risks: no Triggers for Past Attempts: Other (Comment) (conflicts) Intentional Self Injurious Behavior: None Family Suicide  History: Unknown Recent stressful life event(s): Conflict (Comment) Persecutory voices/beliefs?: No Depression: Yes Depression Symptoms: Isolating;Fatigue;Loss of interest in usual pleasures;Feeling worthless/self pity Substance abuse history and/or treatment for substance abuse?: Yes Suicide prevention information given to non-admitted patients: Not applicable  Risk to Others Homicidal Ideation: No Thoughts of Harm to Others: No Current Homicidal Intent: No Current Homicidal Plan: No Access to Homicidal Means: No Identified Victim: na History of harm to others?: No Assessment of Violence: None Noted Violent Behavior Description: na Does patient have access to weapons?: No Criminal Charges Pending?: No Does patient have a court date: No  Psychosis Hallucinations: None noted Delusions: None noted  Mental Status Report Appear/Hygiene: Body odor Eye Contact: Poor Motor Activity: Unsteady Speech: Soft;Slow Level of Consciousness: Alert Mood: Depressed;Sad Affect: Anxious;Depressed Anxiety Level: Moderate Thought Processes: Coherent Judgement: Impaired Orientation: Person;Place;Time;Situation Obsessive Compulsive Thoughts/Behaviors: Minimal  Cognitive Functioning Concentration: Decreased Memory: Recent Intact;Remote Intact IQ: Average Insight: Fair Impulse Control: Fair Appetite: Fair Weight Loss: 0  Weight Gain: 0  Sleep: Decreased Total Hours of Sleep: 3  Vegetative Symptoms: Not bathing;Decreased grooming  Prior Inpatient Therapy Prior Inpatient Therapy: Yes Prior Therapy Dates: 2011 Prior Therapy Facilty/Provider(s): [facility] Reason for Treatment: depression, SI and substance abuse  Prior Outpatient Therapy Prior Outpatient Therapy: Yes Prior Therapy Dates: unknown Prior Therapy Facilty/Provider(s): unknown Reason for Treatment: depression/anxiety            Values / Beliefs Cultural Requests During Hospitalization: None Spiritual Requests During  Hospitalization: None        Additional Information 1:1 In Past 12 Months?: No CIRT Risk: No Elopement Risk: No Does patient have medical clearance?: No     Disposition:  Disposition Disposition of Patient: Referred to Patient referred to: Other (Comment) (sent to ED for med clearance pr MD order.)  On Site Evaluation by:   Reviewed with Physician:     Olin Pia 08/13/2011 6:48 AM

## 2011-08-13 NOTE — [facility] Suicide Risk Assessment (Signed)
Suicide Risk Assessment  Admission Assessment     Demographic factors:  Assessment Details Time of Assessment: Admission Information Obtained From: Patient Current Mental Status:  Current Mental Status:  (Denies SI/HI) Loss Factors:  Loss Factors: Loss of significant relationship Historical Factors:  Historical Factors:  (N/A) Risk Reduction Factors:  Risk Reduction Factors: Positive social support  CLINICAL FACTORS:   Depression:   Anhedonia Hopelessness  COGNITIVE FEATURES THAT CONTRIBUTE TO RISK:  Thought constriction (tunnel vision)    SUICIDE RISK:   Moderate:  Frequent suicidal ideation with limited intensity, and duration, some specificity in terms of plans, no associated intent, good self-control, limited dysphoria/symptomatology, some risk factors present, and identifiable protective factors, including available and accessible social support.  Reason for hospitalization: .Just got worn out off Abilify [facility] MD Progress Note  08/13/2011 10:00 PM  Diagnosis:  Axis I: Major Depression, Recurrent severe  ADL's:  Intact  Sleep: Good  Appetite:  Fair  Suicidal Ideation:  Pt has suicidal thoughts, but contracts for safety  Homicidal Ideation:  Denies adamantly any homicidal thoughts.  Mental Status Examination/Evaluation: Objective:  Appearance: Casual, some beard growth, with pierced earrings and dress on while reclining in bed  Eye Contact::  Fair  Speech:  Clear and Coherent  Volume:  Normal  Mood:  Depressed  Affect:  Congruent  Thought Process:  Coherent  Orientation:  Full  Thought Content:  WDL  Suicidal Thoughts:  Yes.  without intent/plan  Homicidal Thoughts:  No  Memory:  Immediate;   Good  Judgement:  Fair  Insight:  Fair  Psychomotor Activity:  Normal  Concentration:  Fair  Recall:  Fair  Akathisia:  No  Handed:  Right  AIMS (if indicated):     Assets:  Communication Skills Desire for Improvement  Sleep:       Vital Signs: Blood pressure  109/72, pulse 92, temperature 98.1 F (36.7 C), temperature source Oral, resp. rate 20, height 5\' 5"  (1.651 m), weight 77.565 kg (171 lb). Current Medications:  Current Facility-Administered Medications  Medication Dose Route Frequency Provider Last Rate Last Dose  . acetaminophen (TYLENOL) tablet 650 mg  650 mg Oral Q6H PRN Sanjuana Kava, NP      . alum & mag hydroxide-simeth (MAALOX/MYLANTA) 200-200-20 MG/5ML suspension 30 mL  30 mL Oral Q4H PRN Sanjuana Kava, NP      . hydrOXYzine (ATARAX/VISTARIL) tablet 25 mg  25 mg Oral Q4H PRN Sanjuana Kava, NP      . magnesium hydroxide (MILK OF MAGNESIA) suspension 30 mL  30 mL Oral Daily PRN Sanjuana Kava, NP      . nicotine (NICODERM CQ - dosed in mg/24 hours) patch 14 mg  14 mg Transdermal Q0600 Sanjuana Kava, NP       Facility-Administered Medications Ordered in Other Encounters  Medication Dose Route Frequency Provider Last Rate Last Dose  . DISCONTD: ibuprofen (ADVIL,MOTRIN) tablet 600 mg  600 mg Oral Q8H PRN Gavin Pound. Ghim, MD      . DISCONTD: LORazepam (ATIVAN) tablet 1 mg  1 mg Oral Q8H PRN Gavin Pound. Ghim, MD        Lab Results:  Results for orders placed during the hospital encounter of 08/13/11 (from the past 48 hour(s))  CBC     Status: Abnormal   Collection Time   08/13/11  5:22 AM      Component Value Range Comment   WBC 10.6 (*) 4.0 - 10.5 (K/uL)    RBC  5.07  4.22 - 5.81 (MIL/uL)    Hemoglobin 16.1  13.0 - 17.0 (g/dL)    HCT 16.1  09.6 - 04.5 (%)    MCV 93.9  78.0 - 100.0 (fL)    MCH 31.8  26.0 - 34.0 (pg)    MCHC 33.8  30.0 - 36.0 (g/dL)    RDW 40.9  81.1 - 91.4 (%)    Platelets 234  150 - 400 (K/uL)   COMPREHENSIVE METABOLIC PANEL     Status: Abnormal   Collection Time   08/13/11  5:22 AM      Component Value Range Comment   Sodium 138  135 - 145 (mEq/L)    Potassium 3.5  3.5 - 5.1 (mEq/L)    Chloride 102  96 - 112 (mEq/L)    CO2 26  19 - 32 (mEq/L)    Glucose, Bld 110 (*) 70 - 99 (mg/dL)    BUN 13  6 - 23 (mg/dL)     Creatinine, Ser 7.82  0.50 - 1.35 (mg/dL)    Calcium 9.8  8.4 - 10.5 (mg/dL)    Total Protein 7.2  6.0 - 8.3 (g/dL)    Albumin 3.8  3.5 - 5.2 (g/dL)    AST 956 (*) 0 - 37 (U/L)    ALT 275 (*) 0 - 53 (U/L)    Alkaline Phosphatase 96  39 - 117 (U/L)    Total Bilirubin 0.3  0.3 - 1.2 (mg/dL)    GFR calc non Af Amer 79 (*) >90 (mL/min)    GFR calc Af Amer >90  >90 (mL/min)   ETHANOL     Status: Normal   Collection Time   08/13/11  5:22 AM      Component Value Range Comment   Alcohol, Ethyl (B) <11  0 - 11 (mg/dL)   URINE RAPID DRUG SCREEN (HOSP PERFORMED)     Status: Abnormal   Collection Time   08/13/11  6:08 AM      Component Value Range Comment   Opiates NONE DETECTED  NONE DETECTED     Cocaine POSITIVE (*) NONE DETECTED     Benzodiazepines NONE DETECTED  NONE DETECTED     Amphetamines NONE DETECTED  NONE DETECTED     Tetrahydrocannabinol NONE DETECTED  NONE DETECTED     Barbiturates NONE DETECTED  NONE DETECTED     Physical Findings: AIMS:   CIWA:     COWS:      Treatment Plan Summary: Daily contact with patient to assess and evaluate symptoms and progress in treatment Medication management  Risk of harm to self is elevated by his/her thoughts and past attempts and diagnosis of depression as well as gender issues, contracts for safety.  Risk of harm to others is minimal in that he/she has not been involved in fights or had any legal charges filed on him/her.  Plan: We will admit the patient for crisis stabilization and treatment. I talked to pt about restarting Abilify.  Pt was agreeable with that. I explained the risks and benefits of medication in detail.  We will continue on q. 15 checks the unit protocol. At this time there is no clinical indication for one-to-one observation as patient contract for safety and presents little risk to harm themself and others.  We will increase collateral information. I encourage patient to participate in group milieu therapy. Pt will be  seen in treatment team meeting tomorrow morning for further treatment and appropriate discharge planning. Please see history and  physical note for more detailed information ELOS: 3 to 5 days.   [name] 08/13/2011, 9:59 PM

## 2011-08-13 NOTE — ED Notes (Signed)
Pt was a walk in at [facility] and sent here for medical clearance, hx of depression. Pt is also a transgender, already had surgery and prefers to be called a woman. Pt states that she's not suicidal at this time but needs to get back on her abilify

## 2011-08-13 NOTE — BH Assessment (Signed)
Patient accepted to [facility] by Dr. Wallis Mart to Dr. Dan Humphreys Room 507-1. Informed EDP-Dr. Oletta Lamas and made him aware of patients disposition. He agreed to discharge patient to [facility]. Patient nurse-Mike made aware of disposition and will make the appropriate arrangements to transport patient to [facility] for in-pt treatment.

## 2011-08-13 NOTE — Progress Notes (Addendum)
Patient ID: Randy [name], male   DOB: 1970-10-28, 41 y.o.   MRN: [id number] Pt voluntary admit. Pt came to facility as a walk in due to increasing depression and suicidal thoughts. Then this last week started doing drugs again and it made the depression worse. Pt also admits to being off of his medications. Pt came in because he felt he would not be safe if left by himself and knew he needed help. Pt stressors are related to his recent brake-up with his boyfriend and adjusting to being single again. Pt stated he has just been doing a lot of stupid stuff since the brake up and just knows he needs help. Pt is trans-gender. Pt denies SI/HI. Pt is pleasant and cooperative. Pt was given red socks due to at admission he seemed a little unstable when walking.

## 2011-08-13 NOTE — ED Notes (Signed)
Pt states he has been off his abilify for a couple of months and feels depressed and needs to go back on it.  Feels suicidal with no plan.  Denies HI.

## 2011-08-14 DIAGNOSIS — F419 Anxiety disorder, unspecified: Secondary | ICD-10-CM | POA: Diagnosis present

## 2011-08-14 DIAGNOSIS — F332 Major depressive disorder, recurrent severe without psychotic features: Principal | ICD-10-CM

## 2011-08-14 DIAGNOSIS — F1410 Cocaine abuse, uncomplicated: Secondary | ICD-10-CM

## 2011-08-14 MED ORDER — CLONAZEPAM 0.5 MG PO TABS
0.7500 mg | ORAL_TABLET | Freq: Two times a day (BID) | ORAL | Status: DC
  Administered 2011-08-14 – 2011-08-17 (×7): 0.75 mg via ORAL
  Filled 2011-08-14 (×7): qty 2

## 2011-08-14 NOTE — Progress Notes (Signed)
Pt is asleep at this time. Pt appears to be in no signs of distress at this time. Writer will continue to monitor pt. Pt safety remains with q51min checks.

## 2011-08-14 NOTE — Progress Notes (Signed)
Patient ID: Randy [name], male   DOB: [birth year], 41 y.o.   MRN: [id number]   Patient has a flat affect on approach today. Continues to report some depression with a "8" on scale and "7" for hopelessness. Still has SI on and off. Contracts here at hospital. Taking meds without issue today. Staff will monitor and encourage group attendance.

## 2011-08-14 NOTE — Progress Notes (Signed)
[facility] Group Notes: (Counselor/Nursing/MHT/Case Management/Adjunct) 08/14/2011   @11 :00am Preventing Relapse  Type of Therapy:  Group Therapy  Participation Level:  Minimal  Participation Quality: Attentive, Appropriate    Affect:  Flat  Cognitive:  Appropriate  Insight:  None  Engagement in Group: Minimal  Engagement in Therapy:  None  Modes of Intervention:  Support and Exploration  Summary of Progress/Problems: Franco was attentive but did not share personally.  Billie Lade 08/14/2011 12:23 PM

## 2011-08-14 NOTE — Progress Notes (Signed)
[facility] Group Notes: (Counselor/Nursing/MHT/Case Management/Adjunct) 08/14/2011   @1 :15pm [facility]  Type of Therapy:  Group Therapy  Participation Level:  Active  Participation Quality:  Attentive, Sharing, Appropriate   Affect:  Appropriate  Cognitive:  Appropriate  Insight:  Good  Engagement in Group: Good  Engagement in Therapy:  Good  Modes of Intervention:  Support and Exploration  Summary of Progress/Problems: Tirrell was very engaged in discussion of programs and services provided by [facility]. She expressed interest in the services.  Billie Lade 08/14/2011 3:00 PM

## 2011-08-14 NOTE — Progress Notes (Addendum)
Patient seen during during d/c planning group.  She reports being depressed, rated at eight, and SI.  Patient shared his boyfriend went into rehab and he has not heard from him.  Patient has home, but uncertain she will return to the residence due to several break-ins.  Patient states she has money for a motel until she can find out living arrangements.  Patient contracts for safety.  She is followed for medication management by [facility].  Patient shared she is not interested in therapy at this time.     Per State Regulation 482.30 This chart was reviewed for medical necessity with respect to the patient's  Admission/Duration of Stay  [facility], LCSW @4 /09/2011    Next Review Date 08/17/11

## 2011-08-14 NOTE — H&P (Signed)
Medical/psychiatric screening examination/treatment/procedure(s) were performed by non-physician practitioner and as supervising physician I was immediately available for consultation/collaboration.   I have seen and examined this patient and agree with this evaluation.  
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
## 2011-08-15 DIAGNOSIS — F411 Generalized anxiety disorder: Secondary | ICD-10-CM

## 2011-08-15 NOTE — Progress Notes (Signed)
[facility] Adult Inpatient Family/Significant Other Suicide Prevention Education  Suicide Prevention Education:  Patient Refusal for Family/Significant Other Suicide Prevention Education: The patient Randy [name] has refused to provide written consent for family/significant other to be provided Family/Significant Other Suicide Prevention Education during admission and/or prior to discharge.  Physician notified.  Neila Gear 08/15/2011, 4:59 PM

## 2011-08-15 NOTE — Progress Notes (Signed)
Pt presents with minimal interactions this evening. Pt was encouraged but did not get up for group. Pt was scheduled for no medications this evening. Pt also denied having any pain or needing anything for sleep. Pt was asleep throughout the night. Pt rated his depression at a 7 out of 10. Pt is denying any SI/HI this evening. Continued support and availability as needed has been extended to this pt. Pt safety remains with q37min checks.

## 2011-08-15 NOTE — Progress Notes (Signed)
Pt has stayed in her room most of the day. Did come out and join the morning group. Waiting in line for dinner did interact with one of her peers.  Will smile occasionally, otherwise affect is flat and sad.  Rates her depression  As a 7 and her hopelessness as a 6. States that her suicidal thoughts are on and off. Given support, reassurance and praise.

## 2011-08-15 NOTE — Progress Notes (Signed)
[facility] Group Notes:  (Counselor/Nursing/MHT/Case Management/Adjunct)  08/15/2011 1315  Type of Therapy:  Group Therapy  Participation Level:  Did Not Attend  Randy [name] 08/15/2011, 3:46 PM

## 2011-08-15 NOTE — Progress Notes (Signed)
  Randy [name] is a 41 y.o. male [id number] 1970/05/19  08/13/2011 Principal Problem:  *Recurrent major depression-severe Active Problems:  Anxiety disorder  Cocaine abuse   Mental Status: Alert & oriented mood slightly improved from having gotten some sleep. Denies SI/HI/AVH.    Subjective/Objective: Not having withdrawal. Sad because her boyfriend broke off their relationship before  he went to rehab. 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    Filed Vitals:   08/15/11 0701  BP: 111/68  Pulse: 102  Temp:   Resp: 18    Lab Results:   BMET    Component Value Date/Time   NA 138 08/13/2011 0522   K 3.5 08/13/2011 0522   CL 102 08/13/2011 0522   CO2 26 08/13/2011 0522   GLUCOSE 110* 08/13/2011 0522   BUN 13 08/13/2011 0522   CREATININE 1.14 08/13/2011 0522   CALCIUM 9.8 08/13/2011 0522   GFRNONAA 79* 08/13/2011 0522   GFRAA >90 08/13/2011 0522    Medications:  Scheduled:     . ARIPiprazole  5 mg Oral Daily  . buPROPion  300 mg Oral Daily  . clonazePAM  0.75 mg Oral BID  . escitalopram  20 mg Oral Daily  . estradiol  2 mg Oral BID  . finasteride  5 mg Oral Daily  . nicotine  14 mg Transdermal Q0600  . spironolactone  50 mg Oral Daily     PRN Meds acetaminophen, alum & mag hydroxide-simeth, hydrOXYzine, magnesium hydroxide, DISCONTD: hydrOXYzine P. Continue current plan of care.   Rilyn Upshaw,MICKIE D. 08/15/2011

## 2011-08-16 MED ORDER — IBUPROFEN 800 MG PO TABS: 800.0000 mg | ORAL_TABLET | Freq: Four times a day (QID) | ORAL | Status: DC | PRN

## 2011-08-16 NOTE — Progress Notes (Signed)
  Randy [name] is a 41 y.o. male [id number] [birth year]  08/13/2011 Principal Problem:  *Recurrent major depression-severe Active Problems:  Anxiety disorder  Cocaine abuse   Mental Status: Mood is allright denies SI/HI/AVH .    Subjective/Objective: Asks for Motrin 800 mg for carpal tunnel pain. Sees Dr. Elizabeth Sauer [place] re: transgender meds    Filed Vitals:   08/16/11 0731  BP: 121/77  Pulse: 89  Temp:   Resp:     Lab Results:   BMET    Component Value Date/Time   NA 138 08/13/2011 0522   K 3.5 08/13/2011 0522   CL 102 08/13/2011 0522   CO2 26 08/13/2011 0522   GLUCOSE 110* 08/13/2011 0522   BUN 13 08/13/2011 0522   CREATININE 1.14 08/13/2011 0522   CALCIUM 9.8 08/13/2011 0522   GFRNONAA 79* 08/13/2011 0522   GFRAA >90 08/13/2011 0522    Medications:  Scheduled:     . ARIPiprazole  5 mg Oral Daily  . buPROPion  300 mg Oral Daily  . clonazePAM  0.75 mg Oral BID  . escitalopram  20 mg Oral Daily  . estradiol  2 mg Oral BID  . finasteride  5 mg Oral Daily  . nicotine  14 mg Transdermal Q0600  . spironolactone  50 mg Oral Daily     PRN Meds acetaminophen, alum & mag hydroxide-simeth, hydrOXYzine, ibuprofen, magnesium hydroxide  Plan: will order motrin as requested and continue rest of plan. Yohana Bartha,MICKIE D. 08/16/2011

## 2011-08-16 NOTE — Progress Notes (Signed)
[facility] Group Notes:  (Counselor/Nursing/MHT/Case Management/Adjunct)  08/16/2011 1315  Type of Therapy:  Group Therapy  Participation Level:  Active  Participation Quality:  Appropriate  Affect:  Appropriate  Cognitive:  Appropriate  Insight:  Good  Engagement in Group:  Good  Engagement in Therapy:  Good  Modes of Intervention:  Clarification, Problem-solving, Socialization and Support  Summary of Progress/Problems:  Pt. participated in group session on supports. Pt. was asked what support means to them, what is the difference between unhealthy and healthy supports and what they can do when their support is not there. Pt stated in group that she would not want her supports to have her all figured out. Pt stated that if she wanted to change then she would want her supports to change with her instead of think about how she "used to be".   [name], Randy 08/16/2011, 3:19 PM

## 2011-08-16 NOTE — Progress Notes (Signed)
Pt attending the groups and interacting with select peers. Opened herself up in group and shared details about herself. Stated that she had a low self esteem and feels terrible about herself. Was then able to use her 'head' and was able to state numerous positive attributes about herself. Given support, reassurance and praise. Encouraged to continue to use her gifts to express her feelings. Pt is an Tree surgeon. Feels hurt over her boyfriend and has internalized their separation. Encouraged to think about the options of risking a new relationship as opposed to being alone.

## 2011-08-16 NOTE — Progress Notes (Signed)
[facility] Group Notes:  (Counselor/Nursing/MHT/Case Management/Adjunct)  08/16/2011 0830  Type of Therapy:  Discharge Planning  Participation Level:  Active  Summary of Progress/Problems:  Pt. attended and participated in aftercare planning group. [facility] support group information was given as well as information on suicide prevention information, warning signs to look for with suicide and crisis line numbers to use. Pt stated that she is having SI on and off and contracted for safety. Pt states she is at [facility] for SI.   Cross[place], Desiree 08/16/2011, 11:41 AM

## 2011-08-16 NOTE — Progress Notes (Signed)
Patient ID: Randy [name], male   DOB: Dec 05, 1970, 41 y.o.   MRN: [id number] Came out to the dayroom and was watching tv, has been quiet, little interaction noted with peers.  Did attend group this evening, came to med window afterward for hs meds and c/o headache.  Tends to be rather isolative , but compliant with meds, cooperative, soft spoken, contracts for safety.  Will continue to monitor.

## 2011-08-17 MED ORDER — ARIPIPRAZOLE 5 MG PO TABS: 5.0000 mg | ORAL_TABLET | Freq: Every day | ORAL | Status: DC

## 2011-08-17 MED ORDER — CLONAZEPAM 0.5 MG PO TABS: 0.7500 mg | ORAL_TABLET | Freq: Two times a day (BID) | ORAL | Status: DC

## 2011-08-17 MED ORDER — ESCITALOPRAM OXALATE 20 MG PO TABS: 20.0000 mg | ORAL_TABLET | Freq: Every day | ORAL | Status: DC

## 2011-08-17 MED ORDER — ESTRADIOL 2 MG PO TABS: 2.0000 mg | ORAL_TABLET | Freq: Two times a day (BID) | ORAL | Status: DC

## 2011-08-17 MED ORDER — BUPROPION HCL ER (XL) 150 MG PO TB24: 300.0000 mg | ORAL_TABLET | Freq: Every day | ORAL | Status: DC

## 2011-08-17 MED ORDER — FINASTERIDE 5 MG PO TABS: 5.0000 mg | ORAL_TABLET | Freq: Every day | ORAL | Status: DC

## 2011-08-17 MED ORDER — SPIRONOLACTONE 100 MG PO TABS: 50.0000 mg | ORAL_TABLET | Freq: Every day | ORAL | Status: DC

## 2011-08-17 NOTE — Progress Notes (Signed)
Patient's self inventory, poor sleep, improving appetite, low energy level, improving attention span.  Rated depression #6, hopelessness #5.  SI off/on, contracts for safety.  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  No problems taking meds after discharge.

## 2011-08-17 NOTE — Discharge Summary (Signed)
I agree with this D/C Summary.  

## 2011-08-17 NOTE — Progress Notes (Signed)
Patient ID: Randy [name], male   DOB: [birth year], 41 y.o.   MRN: [id number] Remains rather quiet and isolative, had been in his room and slept through group tonight.  Has been pleasant,shy, didn't want to knock on the phone room door even though the person inside was long overdue on the time limit, was sitting on the floor waiting for a turn.  Eye contact is limited, looks at the floor frequently.  Voices no c/o's, passive SI but contracts. Will continue to monitor.

## 2011-08-17 NOTE — Progress Notes (Signed)
Discharge Note:   Patient was given suicide prevention information, which was reviewed with patient.   Stated she understood and had no questions.   Denied SI and HI.   Denied A/V hallucinations.   Denied pain.   Stated she appreciated all the staff has done to assist her while at [facility].

## 2011-08-17 NOTE — Discharge Instructions (Signed)
Attend 90 meetings in 90 days. Get trusted sponsor from the advise of others or from whomever in meetings seems to make sense, has a proven track record, will hold you responsible for your sobriety, and both expects and insists on total abstinence.  Work the steps HONESTLY with the trusted sponsor. Get obsessed with your recovery by often reminding yourself of how DEADLY this dredged horrible disease of addiction JUST IS. Focus the first month on speaker meetings where you specifically look at how your life has been wrecked by drugs/alcohol and how your life has been similar to that of the speakers.   

## 2011-08-17 NOTE — [facility] Suicide Risk Assessment (Signed)
Suicide Risk Assessment  Discharge Assessment     Demographic factors:  Male;Gay, lesbian, or bisexual orientation;Living alone;Unemployed;Caucasian    Current Mental Status Per Nursing Assessment::   On Admission:   (Denies SI/HI) At Discharge:     Current Mental Status Per Physician:  Loss Factors: Loss of significant relationship  Historical Factors:  (N/A)  Risk Reduction Factors:      Continued Clinical Symptoms:  Dysthymia Previous Psychiatric Diagnoses and Treatments  Discharge Diagnoses:   AXIS I:  Anxiety Disorder NOS, Major Depression, Recurrent severe and Cocaine Abuse AXIS II:  Deferred AXIS III:   Past Medical History  Diagnosis Date  . Depression   . Hepatitis C    AXIS IV:  other psychosocial or environmental problems AXIS V:  51-60 moderate symptoms  Cognitive Features That Contribute To Risk:  Thought constriction (tunnel vision)    Suicide Risk:  Minimal: No identifiable suicidal ideation.  Patients presenting with no risk factors but with morbid ruminations; may be classified as minimal risk based on the severity of the depressive symptoms  Current Mental Status Per Physician: ADL's:  Intact  Sleep: Good  Appetite:  Good  Suicidal Ideation:  Denies adamantly any suicidal thoughts. Homicidal Ideation:  Denies adamantly any homicidal thoughts.  Mental Status Examination/Evaluation: Objective:  Appearance: Casual  Eye Contact::  Good  Speech:  Clear and Coherent  Volume:  Normal  Mood:  Euthymic  Affect:  Congruent  Thought Process:  Coherent  Orientation:  Full  Thought Content:  WDL  Suicidal Thoughts:  No  Homicidal Thoughts:  No  Memory:  Immediate;   Good  Judgement:  Good  Insight:  Good  Psychomotor Activity:  Normal  Concentration:  Good  Recall:  Good  Akathisia:  No  AIMS (if indicated):     Assets:  Communication Skills Desire for Improvement Intimacy Physical Health Talents/Skills  Sleep: Number of Hours:  5.25    Vital Signs: Blood pressure 95/66, pulse 97, temperature 97.3 F (36.3 C), temperature source Oral, resp. rate 16, height 5\' 5"  (1.651 m), weight 77.565 kg (171 lb).  Labs No results found for this or any previous visit (from the past 72 hour(s)).  RISK REDUCTION FACTORS: What pt has learned from hospital stay is to keep life in balance and look into the mental health association groups available  Risk of self harm is elevated by her diagnoses, but is excited about hanging out in the Library and researching Art, what he went to school for.  Risk of harm to others is minimal in that she has not been involved in fights or had any legal charges filed on her.  PLAN: Discharge home Continue Medication List  As of 08/17/2011  3:53 PM   TAKE these medications         ARIPiprazole 5 MG tablet   Commonly known as: ABILIFY   Take 1 tablet (5 mg total) by mouth daily. thoughts      buPROPion 150 MG 24 hr tablet   Commonly known as: WELLBUTRIN XL   Take 2 tablets (300 mg total) by mouth daily. For depression      clonazePAM 0.5 MG tablet   Commonly known as: KLONOPIN   Take 1.5 tablets (0.75 mg total) by mouth 2 (two) times daily. For anxiety      escitalopram 20 MG tablet   Commonly known as: LEXAPRO   Take 1 tablet (20 mg total) by mouth daily. For depression/anxiety      estradiol  2 MG tablet   Commonly known as: ESTRACE   Take 1 tablet (2 mg total) by mouth 2 (two) times daily. For hormone supplement      finasteride 5 MG tablet   Commonly known as: PROSCAR   Take 1 tablet (5 mg total) by mouth daily. For hair supplementation      spironolactone 100 MG tablet   Commonly known as: ALDACTONE   Take 0.5 tablets (50 mg total) by mouth daily. For hormone supplement           Follow-up recommendations:  Activities: Resume typical activities Diet: Resume typical diet Other: Follow up with outpatient provider and report any side effects to out patient  prescriber.  Leianne Callins 08/17/2011, 3:36 PM

## 2011-08-17 NOTE — Progress Notes (Signed)
[facility] Case Management Discharge Plan:  Will you be returning to the same living situation after discharge: Yes,  Anthoni will return to her appointment At discharge, do you have transportation home?:Yes,  Patient will arrange her own transportation Do you have the ability to pay for your medications:  Yes, patient has Medicare  Interagency Information:     Release of information consent forms completed and in the chart;  Patient's signature needed at discharge.  Patient to Follow up at:  Follow-up Information    Follow up with [facility].   Contact information:   [address] [place], [place]  [address]  [phone]         Patient denies SI/HI:   Yes,  Jearl is denying SI    Safety Planning and Suicide Prevention discussed:  Yes,  Reviewed during dischare planning groups  Barrier to discharge identified:Yes,  Limited support system  Summary and Recommendatio   Wynn Banker 08/17/2011, 11:38 AM

## 2011-08-17 NOTE — Discharge Summary (Signed)
Physician Discharge Summary Note  Patient:  Randy [name] is an 41 y.o., male MRN:  [id number] DOB:  Jun 21, 1970 Patient phone:  [phone] (home)  Patient address:   [address] [place] [address],   Date of Admission:  08/13/2011  Date of Discharge: 08/17/11  Reason for Admission: Suicidal thoughts.  Discharge Diagnoses: Principal Problem:  *Recurrent major depression-severe Active Problems:  Anxiety disorder  Cocaine abuse   Axis Diagnosis:   AXIS I:  Major depressive diorder, recurrent episode, severe, Cocaine abuse AXIS II:  Deferred AXIS III:   Past Medical History  Diagnosis Date  . Depression   . Hepatitis C    AXIS IV:  other psychosocial or environmental problems, problems related to social environment and problems with primary support group AXIS V:  67  Level of Care:  OP  Hospital Course: This is one of several admissions for Randy [name], a biological male going through transgender process. She presents with suicidal thoughts in about one month and gradually increasing depression after some recent relationship conflict and rejection. She was feeling she couldn't be safe. Beginning to get suicidal thoughts. She also complained of increased problems with concentration, restless unable to sleep, feeling agitated from time to time. And feeling very depressed. Depression was increasing, and appetite was poor. Mood is low and she was feeling that her thoughts were slowing down. She has a history of poverty of thought with severe depression and she felt she was headed in that direction. She been taking Wellbutrin and Lexapro recently but stopped taking Abilify which helped stabilize her mood in the past.  She presents today disheveled, with poor hygiene, hypersomnia with non-for sleep, positive for suicidal thoughts with no plan, with depressed mood and blunt affect. Reporting suicidal thoughts without plan. Cooperative and asking for help with depression.  While a  patient in this hospital, patient received medication management as well as enrolled in group counseling and activities. She also received medication management for her other medical issues as well her hormone replacement therapy. She tolerated her treatment regimen well without any significant adverse effects and reactions presented. She participated actively in group counseling and was able to express her herself within the group.  The group counseling together with her medication regimen proved effective in improving patient's mood as evidenced by patient's reports of decreased symptoms.  Patient attended treatment team meeting this am and stated that he is stable for discharge. Patient will continue psychiatric care on an outpatient basis at [facility] with Johnell Comings on 08/26/11. The address, date and time for this appointment provided for patient. Patient is currently being discharged to his home. He adamantly denies suicidal, homicidal ideations, auditory/visual hallucinations. He left [facility] with all personal belongings in no apparent distress via personal arranged transportation. He was in no apparent distress.   Consults:  None  Significant Diagnostic Studies:  None  Discharge Vitals:   Blood pressure 95/66, pulse 97, temperature 97.3 F (36.3 C), temperature source Oral, resp. rate 16, height 5\' 5"  (1.651 m), weight 77.565 kg (171 lb).  Mental Status Exam: See Mental Status Examination and Suicide Risk Assessment completed by Attending Physician prior to discharge.  Discharge destination:  Home  Is patient on multiple antipsychotic therapies at discharge:  No   Has Patient had three or more failed trials of antipsychotic monotherapy by history:  No  Recommended Plan for Multiple Antipsychotic Therapies: NA   Medication List  As of 08/17/2011  3:19 PM   TAKE these  medications      Indication    ARIPiprazole 5 MG tablet   Commonly known as: ABILIFY   Take 1 tablet (5  mg total) by mouth daily. thoughts       buPROPion 150 MG 24 hr tablet   Commonly known as: WELLBUTRIN XL   Take 2 tablets (300 mg total) by mouth daily. For depression       clonazePAM 0.5 MG tablet   Commonly known as: KLONOPIN   Take 1.5 tablets (0.75 mg total) by mouth 2 (two) times daily. For anxiety       escitalopram 20 MG tablet   Commonly known as: LEXAPRO   Take 1 tablet (20 mg total) by mouth daily. For depression/anxiety       estradiol 2 MG tablet   Commonly known as: ESTRACE   Take 1 tablet (2 mg total) by mouth 2 (two) times daily. For hormone supplement       finasteride 5 MG tablet   Commonly known as: PROSCAR   Take 1 tablet (5 mg total) by mouth daily. For hair supplementation       spironolactone 100 MG tablet   Commonly known as: ALDACTONE   Take 0.5 tablets (50 mg total) by mouth daily. For hormone supplement            Follow-up Information    Follow up with Johnell Comings - [facility] on 08/26/2011. (You are scheduled with Johnell Comings on Wednesday, August 26, 2011  @ 2:30 )    Contact information:   [address] [place], [place]  [address]  [phone]         Follow-up recommendations:  Other:  Keep all scheduled follow-up appointments as recommended.                                                      Take all your medications as prescribed.                                                       Report to your outpatient provider any adverse effects from                                                                               medications promptly.                                                       Activities as tolerated.  Comments:  Follow-up with your primary care physician for your other medical issues.   SignedArmandina Stammer I 08/17/2011, 3:19 PM

## 2011-08-17 NOTE — Progress Notes (Addendum)
[facility] Group Notes: (Counselor/Nursing/MHT/Case Management/Adjunct) 08/17/2011   @  11:00am Overcoming Obstacles to Wellness   Type of Therapy:  Group Therapy  Participation Level: Limited    Participation Quality:  Attentive  Affect:  Blunted  Cognitive:  Appropriate  Insight:  None  Engagement in Group: Limited  Engagement in Therapy:  Limited  Modes of Intervention:  Support and Exploration  Summary of Progress/Problems: Randy [name]  was attentive but not engaged in group process. She seemed to relate well to other group members but did not share personally.   Randy [name] 08/17/2011 3:36 PM    [facility] Group Notes: (Counselor/Nursing/MHT/Case Management/Adjunct)  08/17/2011 @ 1:14pm  Music and Mood  Type of Therapy: Group Therapy   Participation Level: Limited   Participation Quality: Attentive   Affect: Blunted   Cognitive: Appropriate   Insight: None   Engagement in Group: Limited   Engagement in Therapy: Limited   Modes of Intervention: Support and Exploration   Summary of Progress/Problems: Randy [name] was attentive but not engaged in group process.    Randy [name]  08/17/2011  3:36 PM

## 2011-08-19 NOTE — Progress Notes (Signed)
Patient Discharge Instructions:  Psychiatric Admission Assessment Note Provided,  08/19/2011 After Visit Summary (AVS) Provided,  08/19/2011 Face Sheet Provided, 08/19/2011 Faxed/Sent to the Next Level Care provider:  08/19/2011 Sent Suicide Risk Assessment - Discharge Assessment  08/19/2011  [facility] - Misty Stanley [place] @ [phone]  [name], 08/19/2011, 3:01 PM

## 2011-08-26 DIAGNOSIS — F0630 Mood disorder due to known physiological condition, unspecified: Secondary | ICD-10-CM | POA: Diagnosis not present

## 2011-12-15 DIAGNOSIS — F640 Transsexualism: Secondary | ICD-10-CM | POA: Diagnosis not present

## 2011-12-15 DIAGNOSIS — F329 Major depressive disorder, single episode, unspecified: Secondary | ICD-10-CM | POA: Diagnosis not present

## 2011-12-15 DIAGNOSIS — Z136 Encounter for screening for cardiovascular disorders: Secondary | ICD-10-CM | POA: Diagnosis not present

## 2011-12-15 DIAGNOSIS — Z131 Encounter for screening for diabetes mellitus: Secondary | ICD-10-CM | POA: Diagnosis not present

## 2011-12-15 DIAGNOSIS — Z13220 Encounter for screening for lipoid disorders: Secondary | ICD-10-CM | POA: Diagnosis not present

## 2012-01-09 ENCOUNTER — Encounter (HOSPITAL_COMMUNITY): Payer: Self-pay | Admitting: Emergency Medicine

## 2012-01-09 ENCOUNTER — Inpatient Hospital Stay (HOSPITAL_COMMUNITY)
Admission: EM | Admit: 2012-01-09 | Discharge: 2012-01-12 | DRG: 603 | Disposition: A | Discharge status: Home / Self Care | Payer: Medicare Other | Attending: Internal Medicine | Admitting: Internal Medicine

## 2012-01-09 DIAGNOSIS — L02419 Cutaneous abscess of limb, unspecified: Secondary | ICD-10-CM | POA: Diagnosis present

## 2012-01-09 DIAGNOSIS — Z720 Tobacco use: Secondary | ICD-10-CM | POA: Diagnosis present

## 2012-01-09 DIAGNOSIS — F640 Transsexualism: Secondary | ICD-10-CM | POA: Diagnosis present

## 2012-01-09 DIAGNOSIS — F332 Major depressive disorder, recurrent severe without psychotic features: Secondary | ICD-10-CM | POA: Diagnosis present

## 2012-01-09 DIAGNOSIS — F419 Anxiety disorder, unspecified: Secondary | ICD-10-CM | POA: Diagnosis present

## 2012-01-09 DIAGNOSIS — F1410 Cocaine abuse, uncomplicated: Secondary | ICD-10-CM | POA: Diagnosis present

## 2012-01-09 DIAGNOSIS — F1210 Cannabis abuse, uncomplicated: Secondary | ICD-10-CM | POA: Diagnosis present

## 2012-01-09 DIAGNOSIS — E349 Endocrine disorder, unspecified: Secondary | ICD-10-CM | POA: Diagnosis present

## 2012-01-09 DIAGNOSIS — R7989 Other specified abnormal findings of blood chemistry: Secondary | ICD-10-CM | POA: Diagnosis present

## 2012-01-09 DIAGNOSIS — IMO0002 Reserved for concepts with insufficient information to code with codable children: Principal | ICD-10-CM | POA: Diagnosis present

## 2012-01-09 DIAGNOSIS — B1920 Unspecified viral hepatitis C without hepatic coma: Secondary | ICD-10-CM | POA: Diagnosis present

## 2012-01-09 DIAGNOSIS — B182 Chronic viral hepatitis C: Secondary | ICD-10-CM | POA: Diagnosis present

## 2012-01-09 DIAGNOSIS — E871 Hypo-osmolality and hyponatremia: Secondary | ICD-10-CM | POA: Diagnosis present

## 2012-01-09 DIAGNOSIS — F411 Generalized anxiety disorder: Secondary | ICD-10-CM | POA: Diagnosis present

## 2012-01-09 DIAGNOSIS — L0390 Cellulitis, unspecified: Secondary | ICD-10-CM

## 2012-01-09 DIAGNOSIS — IMO0001 Reserved for inherently not codable concepts without codable children: Secondary | ICD-10-CM | POA: Diagnosis present

## 2012-01-09 DIAGNOSIS — F17200 Nicotine dependence, unspecified, uncomplicated: Secondary | ICD-10-CM | POA: Diagnosis present

## 2012-01-09 DIAGNOSIS — F1110 Opioid abuse, uncomplicated: Secondary | ICD-10-CM | POA: Diagnosis present

## 2012-01-09 DIAGNOSIS — F1910 Other psychoactive substance abuse, uncomplicated: Secondary | ICD-10-CM | POA: Diagnosis present

## 2012-01-09 HISTORY — DX: Endocrine disorder, unspecified: E34.9

## 2012-01-09 HISTORY — DX: Transsexualism: F64.0

## 2012-01-09 HISTORY — DX: Cellulitis of unspecified part of limb: L03.119

## 2012-01-09 HISTORY — DX: Cutaneous abscess of limb, unspecified: L02.419
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
## 2012-01-10 ENCOUNTER — Encounter (HOSPITAL_COMMUNITY): Payer: Self-pay | Admitting: Internal Medicine

## 2012-01-10 DIAGNOSIS — Z720 Tobacco use: Secondary | ICD-10-CM | POA: Diagnosis present

## 2012-01-10 DIAGNOSIS — IMO0001 Reserved for inherently not codable concepts without codable children: Secondary | ICD-10-CM

## 2012-01-10 DIAGNOSIS — F640 Transsexualism: Secondary | ICD-10-CM | POA: Diagnosis present

## 2012-01-10 DIAGNOSIS — E871 Hypo-osmolality and hyponatremia: Secondary | ICD-10-CM | POA: Diagnosis present

## 2012-01-10 DIAGNOSIS — L02419 Cutaneous abscess of limb, unspecified: Secondary | ICD-10-CM | POA: Diagnosis present

## 2012-01-10 DIAGNOSIS — F1910 Other psychoactive substance abuse, uncomplicated: Secondary | ICD-10-CM | POA: Diagnosis present

## 2012-01-10 DIAGNOSIS — B182 Chronic viral hepatitis C: Secondary | ICD-10-CM | POA: Diagnosis present

## 2012-01-10 DIAGNOSIS — L0390 Cellulitis, unspecified: Secondary | ICD-10-CM

## 2012-01-10 DIAGNOSIS — E349 Endocrine disorder, unspecified: Secondary | ICD-10-CM

## 2012-01-10 DIAGNOSIS — L0291 Cutaneous abscess, unspecified: Secondary | ICD-10-CM | POA: Diagnosis not present

## 2012-01-10 HISTORY — DX: Endocrine disorder, unspecified: E34.9

## 2012-01-10 HISTORY — DX: Reserved for inherently not codable concepts without codable children: IMO0001

## 2012-01-10 HISTORY — DX: Cellulitis of unspecified part of limb: L02.419

## 2012-01-10 LAB — HIV ANTIBODY (ROUTINE TESTING W REFLEX): HIV: NONREACTIVE

## 2012-01-10 LAB — COMPREHENSIVE METABOLIC PANEL
Alkaline Phosphatase: 62 U/L (ref 39–117)
BUN: 9 mg/dL (ref 6–23)
Chloride: 96 mEq/L (ref 96–112)
Creatinine, Ser: 1.05 mg/dL (ref 0.50–1.35)
GFR calc Af Amer: >90 mL/min (ref >90)
Glucose, Bld: 85 mg/dL (ref 70–99)
Potassium: 3.6 mEq/L (ref 3.5–5.1)
Total Bilirubin: 0.7 mg/dL (ref 0.3–1.2)

## 2012-01-10 LAB — CBC
HCT: 46.3 % (ref 39.0–52.0)
Hemoglobin: 16 g/dL (ref 13.0–17.0)
MCH: 31.5 pg (ref 26.0–34.0)
RBC: 5.08 MIL/uL (ref 4.22–5.81)

## 2012-01-10 LAB — MRSA PCR SCREENING: MRSA by PCR: NEGATIVE

## 2012-01-10 MED ORDER — SODIUM CHLORIDE 0.9 % IV SOLN
500.0000 mg | Freq: Once | INTRAVENOUS | Status: AC
  Administered 2012-01-10: 500 mg via INTRAVENOUS
  Filled 2012-01-10: qty 500

## 2012-01-10 MED ORDER — CLONAZEPAM 0.5 MG PO TABS
0.7500 mg | ORAL_TABLET | Freq: Two times a day (BID) | ORAL | Status: DC
  Administered 2012-01-10 – 2012-01-12 (×6): 0.75 mg via ORAL
  Filled 2012-01-10: qty 1
  Filled 2012-01-10: qty 2
  Filled 2012-01-10: qty 1
  Filled 2012-01-10 (×3): qty 2
  Filled 2012-01-10: qty 1

## 2012-01-10 MED ORDER — FINASTERIDE 5 MG PO TABS
5.0000 mg | ORAL_TABLET | Freq: Every day | ORAL | Status: DC
  Administered 2012-01-10 – 2012-01-12 (×3): 5 mg via ORAL
  Filled 2012-01-10 (×3): qty 1

## 2012-01-10 MED ORDER — SODIUM CHLORIDE 0.9 % IV SOLN: 250.0000 mL | INTRAVENOUS | Status: DC | PRN

## 2012-01-10 MED ORDER — SODIUM CHLORIDE 0.9 % IJ SOLN: 3.0000 mL | INTRAMUSCULAR | Status: DC | PRN

## 2012-01-10 MED ORDER — ENOXAPARIN SODIUM 40 MG/0.4ML ~~LOC~~ SOLN
40.0000 mg | SUBCUTANEOUS | Status: DC
  Administered 2012-01-10 – 2012-01-12 (×3): 40 mg via SUBCUTANEOUS
  Filled 2012-01-10 (×3): qty 0.4

## 2012-01-10 MED ORDER — ACETAMINOPHEN 325 MG PO TABS: 650.0000 mg | ORAL_TABLET | Freq: Four times a day (QID) | ORAL | Status: DC | PRN

## 2012-01-10 MED ORDER — VANCOMYCIN HCL IN DEXTROSE 1-5 GM/200ML-% IV SOLN
1000.0000 mg | Freq: Two times a day (BID) | INTRAVENOUS | Status: DC
  Administered 2012-01-10 – 2012-01-11 (×3): 1000 mg via INTRAVENOUS
  Filled 2012-01-10 (×4): qty 200

## 2012-01-10 MED ORDER — SODIUM CHLORIDE 0.9 % IV SOLN
500.0000 mg | Freq: Four times a day (QID) | INTRAVENOUS | Status: DC
  Administered 2012-01-10 – 2012-01-11 (×3): 500 mg via INTRAVENOUS
  Filled 2012-01-10 (×6): qty 500

## 2012-01-10 MED ORDER — SODIUM CHLORIDE 0.9 % IV SOLN
Freq: Once | INTRAVENOUS | Status: AC
  Administered 2012-01-10: 03:00:00 via INTRAVENOUS

## 2012-01-10 MED ORDER — ALUM & MAG HYDROXIDE-SIMETH 200-200-20 MG/5ML PO SUSP: 30.0000 mL | Freq: Four times a day (QID) | ORAL | Status: DC | PRN

## 2012-01-10 MED ORDER — SPIRONOLACTONE 50 MG PO TABS
50.0000 mg | ORAL_TABLET | Freq: Every day | ORAL | Status: DC
  Administered 2012-01-10 – 2012-01-12 (×3): 50 mg via ORAL
  Filled 2012-01-10 (×3): qty 1

## 2012-01-10 MED ORDER — VANCOMYCIN HCL IN DEXTROSE 1-5 GM/200ML-% IV SOLN
1000.0000 mg | Freq: Once | INTRAVENOUS | Status: AC
  Administered 2012-01-10: 1000 mg via INTRAVENOUS
  Filled 2012-01-10: qty 200

## 2012-01-10 MED ORDER — ESCITALOPRAM OXALATE 20 MG PO TABS
20.0000 mg | ORAL_TABLET | Freq: Every day | ORAL | Status: DC
  Administered 2012-01-10 – 2012-01-12 (×3): 20 mg via ORAL
  Filled 2012-01-10 (×3): qty 1

## 2012-01-10 MED ORDER — ESTRADIOL 2 MG PO TABS
2.0000 mg | ORAL_TABLET | Freq: Three times a day (TID) | ORAL | Status: DC
  Administered 2012-01-10 – 2012-01-12 (×7): 2 mg via ORAL
  Filled 2012-01-10 (×8): qty 1

## 2012-01-10 MED ORDER — NICOTINE 14 MG/24HR TD PT24
14.0000 mg | MEDICATED_PATCH | Freq: Every day | TRANSDERMAL | Status: DC
  Administered 2012-01-10 – 2012-01-12 (×3): 14 mg via TRANSDERMAL
  Filled 2012-01-10 (×3): qty 1

## 2012-01-10 MED ORDER — BUPROPION HCL ER (XL) 300 MG PO TB24
300.0000 mg | ORAL_TABLET | Freq: Every day | ORAL | Status: DC
  Administered 2012-01-10 – 2012-01-12 (×3): 300 mg via ORAL
  Filled 2012-01-10 (×3): qty 1

## 2012-01-10 MED ORDER — SODIUM CHLORIDE 0.9 % IJ SOLN
3.0000 mL | Freq: Two times a day (BID) | INTRAMUSCULAR | Status: DC
  Administered 2012-01-11 – 2012-01-12 (×4): 3 mL via INTRAVENOUS

## 2012-01-10 MED ORDER — ACETAMINOPHEN 650 MG RE SUPP: 650.0000 mg | Freq: Four times a day (QID) | RECTAL | Status: DC | PRN

## 2012-01-10 MED ORDER — ONDANSETRON HCL 4 MG PO TABS: 4.0000 mg | ORAL_TABLET | Freq: Four times a day (QID) | ORAL | Status: DC | PRN

## 2012-01-10 MED ORDER — POLYETHYLENE GLYCOL 3350 17 G PO PACK
17.0000 g | PACK | Freq: Every day | ORAL | Status: DC | PRN
  Filled 2012-01-10: qty 1

## 2012-01-10 MED ORDER — ONDANSETRON HCL 4 MG/2ML IJ SOLN: 4.0000 mg | Freq: Four times a day (QID) | INTRAMUSCULAR | Status: DC | PRN

## 2012-01-10 MED ORDER — ARIPIPRAZOLE 5 MG PO TABS
5.0000 mg | ORAL_TABLET | Freq: Every day | ORAL | Status: DC
  Administered 2012-01-10 – 2012-01-12 (×3): 5 mg via ORAL
  Filled 2012-01-10 (×3): qty 1

## 2012-01-10 MED ORDER — OXYCODONE HCL 5 MG PO TABS: 5.0000 mg | ORAL_TABLET | ORAL | Status: DC | PRN

## 2012-01-10 NOTE — ED Notes (Signed)
Pt sleeping at this time. Even ,unlabored resp. Will continue to monitor 
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
## 2012-01-10 NOTE — Progress Notes (Signed)
ANTIBIOTIC CONSULT NOTE - INITIAL  Pharmacy Consult for vancomycin/primaxin Indication: cellulitis in IVDA  Allergies  Allergen Reactions  . Penicillins     Swollen joints     Patient Measurements:   Adjusted Body Weight:   Vital Signs: Temp: 100 F (37.8 C) (09/01 0201) Temp src: Oral (09/01 0201) BP: 108/74 mmHg (09/01 0201) Pulse Rate: 90  (09/01 0201) Intake/Output from previous day:   Intake/Output from this shift:    Labs:  Basename 01/10/12 0230  WBC 11.0*  HGB 16.0  PLT 202  LABCREA --  CREATININE 1.05   The CrCl is unknown because both a height and weight (above a minimum accepted value) are required for this calculation. No results found for this basename: VANCOTROUGH:2,VANCOPEAK:2,VANCORANDOM:2,GENTTROUGH:2,GENTPEAK:2,GENTRANDOM:2,TOBRATROUGH:2,TOBRAPEAK:2,TOBRARND:2,AMIKACINPEAK:2,AMIKACINTROU:2,AMIKACIN:2, in the last 72 hours   Microbiology: No results found for this or any previous visit (from the past 720 hour(s)).  Medical History: Past Medical History  Diagnosis Date  . Depression   . Hepatitis C   . 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 Cellulitis  Of  Left Forearm 01/10/2012    From IVDA    Medications:  Scheduled:    . sodium chloride   Intravenous Once  . vancomycin  1,000 mg Intravenous Once   Assessment: 33 YOM (transgender taking hormonal therapy) admitted with fever, chills, erythema/swelling of L forearm.  Admits to Heroin use in same arm ~ 1 wk ago. Has h/o joint swelling to PCN, orders to start broad spectrum abx vancomycin/imipenem.  Vancomycin 1gm given in ED at 0325 9/1.  9/1: Blood cx= Pending  Vancomycin: 9/1 >> Imipenem: 9/1 >>  Goal of Therapy:  Vancomycin trough level 10-15 mcg/ml  Plan:  - Based on est CrCl, start vancomyin 1gm IV q12h (give next dose at 1100am) -Imipenem 500mg  IV q6h -follow renal fx and check Css if remains on vancomcin >=5 days -Follow culture data and clinical improvement,  de-escalate antibiotics as appropriate.   Dannielle Huh 01/10/2012,8:24 AM

## 2012-01-10 NOTE — H&P (Signed)
[facility] History and Physical  Randy [name] NWG:[id number] DOB: April 22, 1971 DOA: 01/09/2012  Referring physician: Earley Favor, NP PCP: No primary provider on file.  Patient confirms he does not have a PCP  Chief Complaint: Left arm pain and swelling.   History of Present Illness: Randy [name] is an 41 y.o. 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  The patient admits to injecting heroin into his left forearm about 1 week ago.  He subsequently developed an area of induration about the injection sight, but denies that it was pustular and did not drain andy liquid.  He also has had some fever and chills.  The patient states he has been HIV tested in the past, and has been negative.    Review of Systems: Constitutional: + fever and chills;  Appetite normal; No weight loss, no weight gain.  HEENT: No blurry vision, no diplopia, no pharyngitis, no dysphagia CV: No chest pain, no palpitations.  Resp: No SOB, no cough. GI: No nausea, no vomiting, no diarrhea, no melena, no hematochezia.  GU: No dysuria, no hematuria.  MSK: no myalgias, no arthralgias.  Neuro:  No headache, no focal neurological deficits, no history of seizures.  Psych: + history of depression and anxiety.  Endo: No thyroid disease, no DM, no heat intolerance, no cold intolerance, no polyuria, no polydipsia  Skin: No rashes, no skin lesions.  Heme: No easy bruising, no history of blood diseases.  Past Medical History Past Medical History  Diagnosis Date  . Depression   . Hepatitis C      Past Surgical History History reviewed. No pertinent past surgical history.   Social History: History   Social History  . Marital Status: Single    Spouse Name: N/A    Number of Children: N/A  . Years of Education: N/A   Occupational History  .  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 Smoking status: Current Everyday Smoker -- 0.5 packs/day for 20 years  . Smokeless tobacco: Never Used  . Alcohol Use: 25.8 oz/week    40 Cans of beer, 3 Glasses of wine per week     1-2 drinks 1-2 times a week.  . Drug Use: 7 per week    Special: Cocaine, Marijuana, Heroin     IVDA   . Sexually Active: Yes -- Male partner(s)     Has been HIV tested in past and has been negative.   Other Topics Concern  . Not on file   Social History Narrative   Transgendered male.  Single.  Lives alone.  Independent.    Family History:  Family History  Problem Relation Age of Onset  . Idiopathic pulmonary fibrosis Father     Allergies: Penicillins  Meds: Prior to Admission medications   Medication Sig Start Date End Date Taking? Authorizing Provider  ARIPiprazole (ABILIFY) 5 MG tablet Take 5 mg by mouth daily. thoughts 08/17/11  Yes Mike Craze, MD  buPROPion (WELLBUTRIN XL) 150 MG 24 hr tablet Take 300 mg by mouth daily. For depression 08/17/11  Yes Mike Craze, MD  clonazePAM (KLONOPIN) 0.5 MG tablet Take 0.75 mg by mouth 2 (two) times daily. For anxiety 08/17/11  Yes Mike Craze, MD  escitalopram (LEXAPRO) 20 MG tablet Take 20 mg by  mouth daily. For depression/anxiety 08/17/11  Yes Mike Craze, MD  estradiol (ESTRACE) 2 MG tablet Take 2 mg by mouth 3 (three) times daily. For hormone supplement 08/17/11  Yes Mike Craze, MD  finasteride (PROSCAR) 5 MG tablet Take 5 mg by mouth daily. For hair supplementation 08/17/11  Yes Mike Craze, MD  spironolactone (ALDACTONE) 100 MG tablet Take 50 mg by mouth daily. For hormone supplement 08/17/11  Yes Mike Craze, MD    Physical Exam: Filed Vitals:   01/09/12 1854 01/10/12 0004 01/10/12 0201  BP: 118/72 199/69 108/74  Pulse: 103 98 90  Temp: 99.4 F (37.4 C) 99.6 F (37.6 C) 100 F (37.8 C)  TempSrc: Oral Oral Oral  Resp: 18 20 16   SpO2: 100% 99% 100%      Physical Exam: Blood pressure 108/74, pulse 90, temperature 100 F (37.8 C), temperature source Oral, resp. rate 16, SpO2 100.00%. BP 108/74  Pulse 90  Temp 100 F (37.8 C) (Oral)  Resp 16  SpO2 100%  General Appearance:    Alert, cooperative, no distress, appears stated age  Head:    Normocephalic, without obvious abnormality, atraumatic  Eyes:    PERRL, conjunctiva/corneas clear, EOM's intact      Ears:    Normal external ear canals, both ears  Nose:   Nares normal, septum midline, mucosa normal, no drainage    or sinus tenderness  Throat:   Lips, mucosa, and tongue normal; teeth in fair condition with some caries.  Neck:   Supple, symmetrical, trachea midline, no adenopathy;       thyroid:  No enlargement/tenderness/nodules; no carotid   bruit or JVD  Back:     Symmetric, no curvature, ROM normal, no CVA tenderness  Lungs:     Clear to auscultation bilaterally, respirations unlabored  Chest wall:    No tenderness or deformity  Heart:    Regular rate and rhythm, S1 and S2 normal, no murmur, rub   or gallop  Abdomen:     Soft, non-tender, bowel sounds active all four quadrants,    no masses, no organomegaly  Extremities:   Left upper extremity with swelling, erythema from hand to elbow.  Pulses:   2+ and symmetric all extremities  Skin:   Skin color, texture, turgor normal, erythema LUE as noted  Lymph nodes:   Cervical, supraclavicular, and axillary nodes normal  Neurologic:   Non-focal    Labs on Admission:  Basic Metabolic Panel:  Lab 01/10/12 1610  NA 133*  K 3.6  CL 96  CO2 26  GLUCOSE 85  BUN 9  CREATININE 1.05  CALCIUM 9.4  MG --  PHOS --   Liver Function Tests:  Lab 01/10/12 0230  AST 49*  ALT 113*  ALKPHOS 62  BILITOT 0.7  PROT 7.4  ALBUMIN 3.7   CBC:  Lab 01/10/12 0230  WBC 11.0*  NEUTROABS --  HGB 16.0  HCT 46.3  MCV 91.1  PLT 202    Radiological Exams on Admission: No results found.   Assessment/Plan Principal Problem:   *Cellulitis  Of  Left Forearm  Admit for IV antibiotics: Vancomycin to cover gram + organisms including MRSA (will check for nasopharyngeal carriage) and Imipenem to cover gram - organisms.  Blood cultures x 2 already sent.   Active Problems:  Recurrent major depression-severe / Anxiety disorder  Continue usual home medications: Abilify, Wellbutrin XL, Klonopin, Lexapro.  Polysubstance abuse including IVDA (heroin), cocaine, marijuana  SW consult for substance  abuse counseling.  Check HIV status given h/o sex with men, IVDA, hepatitis C.  Elevated LFTs / Hepatitis C  LFTs mildly elevated, likely related to history of hepatitis C.  Hyponatremia  May be related to SSRI therapy or Spironolactone treatment.  Mild.  Will monitor.  Hormonal imbalance in transgender patient  Continue Estrace and androgen inhibitors (Proscar, Spironolactone).  Tobacco Abuse  Tobacco cessation counseling.   Code Status: Full Family Communication: [name],Randy Mother [phone] Disposition Plan: Home, when stable  Time spent: 1 hour.  Randy [name] [facility] Pager [phone]  If 7PM-7AM, please contact night-coverage [email or web address] Password [facility] 01/10/2012, 7:43 AM

## 2012-01-10 NOTE — Progress Notes (Signed)
Attempted to call report to floor, however, RN unable to accept at this time and will call back when available. Direct line number given to Diplomatic Services operational officer.

## 2012-01-10 NOTE — ED Provider Notes (Signed)
Medical screening examination/treatment/procedure(s) were performed by non-physician practitioner and as supervising physician I was immediately available for consultation/collaboration.  Amanda Steuart, MD 01/10/12 0723 

## 2012-01-10 NOTE — Progress Notes (Signed)
Clinical Social Work Department BRIEF PSYCHOSOCIAL ASSESSMENT 01/10/2012  Patient:  Randy [name], Randy [name]     Account Number:  [id number]     Admit date:  01/09/2012  Clinical Social Worker:  Doroteo Glassman  Date/Time:  01/10/2012 03:45 PM  Referred by:  Physician  Date Referred:  01/10/2012 Referred for  Substance Abuse   Other Referral:   Interview type:  Patient Other interview type:    PSYCHOSOCIAL DATA Living Status:  FRIEND(S) Admitted from facility:   Level of care:   Primary support name:  Aram Beecham Primary support relationship to patient:  PARENT Degree of support available:   Unknown    CURRENT CONCERNS Current Concerns  Substance Abuse   Other Concerns:    SOCIAL WORK ASSESSMENT / PLAN Met with Pt to discuss SA issues.    Pt minimized her use and stated that she uses infrequently. She stated that she has friends that are addicted to Heroin and she states that she will not allow herself to get to that level.    Pt states that she only uses when she experiences pxs with depression but states that she's not currenlty experiencing significant pxs with depression.  Pt laughs about current hospitalization and recognizes that she's minimizing her behaviors.    Pt declined SA information, stating, "I have resources and enough people around me to help me through this."  CSW offered several times to assist Pt with tx; each time, Pt declined.    CSW thanked Pt for her time.    No further CSW needs.    CSW to sign off.   Assessment/plan status:  No Further Intervention Required Other assessment/ plan:   Information/referral to community resources:   declined    PATIENT'S/FAMILY'S RESPONSE TO PLAN OF CARE: Pt thanked CSW for time and assistance.  CSW to sign off.   Providence Crosby, LCSWA Clinical Social Work [phone]

## 2012-01-11 DIAGNOSIS — L0291 Cutaneous abscess, unspecified: Secondary | ICD-10-CM | POA: Diagnosis not present

## 2012-01-11 DIAGNOSIS — L0390 Cellulitis, unspecified: Secondary | ICD-10-CM | POA: Diagnosis not present

## 2012-01-11 LAB — BASIC METABOLIC PANEL
Chloride: 100 mEq/L (ref 96–112)
Creatinine, Ser: 1.09 mg/dL (ref 0.50–1.35)
GFR calc Af Amer: >90 mL/min (ref >90)
GFR calc non Af Amer: 83 mL/min — ABNORMAL LOW (ref >90)
Potassium: 3.7 mEq/L (ref 3.5–5.1)

## 2012-01-11 LAB — CBC
HCT: 41.6 % (ref 39.0–52.0)
Hemoglobin: 14.3 g/dL (ref 13.0–17.0)
RDW: 13.4 % (ref 11.5–15.5)
WBC: 11.1 10*3/uL — ABNORMAL HIGH (ref 4.0–10.5)

## 2012-01-11 MED ORDER — SODIUM CHLORIDE 0.9 % IV SOLN
500.0000 mg | Freq: Three times a day (TID) | INTRAVENOUS | Status: DC
  Administered 2012-01-11 – 2012-01-12 (×2): 500 mg via INTRAVENOUS
  Filled 2012-01-11 (×4): qty 500

## 2012-01-11 NOTE — Progress Notes (Signed)
[facility] PROGRESS NOTE  Fransisco Messmer ZOX:[id number] DOB: [birth year] DOA: 01/09/2012 PCP: No primary provider on file.  Patient confirms he does not have a PCP.  Brief narrative: Randy [name] is an 41 y.o. 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  Assessment/Plan: Principal Problem:  *Cellulitis Of Left Forearm  Admitted for IV antibiotics: Vancomycin to cover gram + organisms including MRSA (negative for nasopharyngeal carriage) and Imipenem to cover gram - organisms.  Blood cultures x 2 already sent.  Negative to date. HIV non-reactive. Active Problems:  Recurrent major depression-severe / Anxiety disorder  Continue usual home medications: Abilify, Wellbutrin XL, Klonopin, Lexapro. Polysubstance abuse including IVDA (heroin), cocaine, marijuana  SW consulted for substance abuse counseling. Minimizes use, and declines outpatient treatment. HIV non-reactive. Elevated LFTs / Hepatitis C  LFTs mildly elevated, likely related to history of hepatitis C. Hyponatremia  May be related to SSRI therapy or Spironolactone treatment. Mild. Will monitor. Hormonal imbalance in transgender patient  Continue Estrace and androgen inhibitors (Proscar, Spironolactone). Tobacco Abuse  Tobacco cessation counseling. Nicotine patch ordered.   Code Status: Full  Family Communication: Partin,Cynthia Mother [phone] for emergencies.  No family at bedside. Disposition Plan: Home, when stable  Medical Consultants:  None  Other Consultants:  Social work: Decline substance abuse treatment.  Procedures:  None  Antibiotics:  Imipenem 01/10/12--->  Vancomycin 01/10/12--->  HPI/Subjective: Mr. Randy [name] reports some improvement in his left arm swelling and discomfort.  No nausea, vomiting, diarrhea.  Appetite  good.  Objective: Filed Vitals:   01/10/12 1405 01/10/12 2025 01/10/12 2056 01/11/12 0613  BP: 105/64  102/62 112/63  Pulse: 94 97  90  Temp: 100.9 F (38.3 C) 99.7 F (37.6 C)  98.6 F (37 C)  TempSrc: Oral Oral  Oral  Resp: 18 16  18   Height:      Weight:      SpO2: 100% 99%  100%    Intake/Output Summary (Last 24 hours) at 01/11/12 1008 Last data filed at 01/11/12 0900  Gross per 24 hour  Intake    720 ml  Output      0 ml  Net    720 ml    Exam: Gen:  NAD Cardiovascular:  RRR, No M/R/G Respiratory: Lungs CTAB Gastrointestinal: Abdomen soft, NT/ND with normal active bowel sounds. Extremities: Left arm less erythematous, no pustules, abscesses or areas of fluctuance noted.  Data Reviewed: Basic Metabolic Panel:  Lab 01/11/12 5621 01/10/12 0230  NA 132* 133*  K 3.7 3.6  CL 100 96  CO2 24 26  GLUCOSE 96 85  BUN 10 9  CREATININE 1.09 1.05  CALCIUM 8.7 9.4  MG -- --  PHOS -- --   GFR Estimated Creatinine Clearance: 81.3 ml/min (by C-G formula based on Cr of 1.09). Liver Function Tests:  Lab 01/10/12 0230  AST 49*  ALT 113*  ALKPHOS 62  BILITOT 0.7  PROT 7.4  ALBUMIN 3.7   CBC:  Lab 01/11/12 0342 01/10/12 0230  WBC 11.1* 11.0*  NEUTROABS -- --  HGB 14.3 16.0  HCT 41.6 46.3  MCV 91.0 91.1  PLT 174 202   Microbiology Recent Results (from the past 240 hour(s))  CULTURE, BLOOD (ROUTINE X 2)     Status: Normal (Preliminary result)   Collection Time   01/10/12  2:30 AM      Component Value Range  Status Comment   Specimen Description BLOOD RIGHT ANTECUBITAL   Final    Special Requests     Final    Value: BOTTLES DRAWN AEROBIC AND ANAEROBIC 5CC AEROBIC/4CC ANAEROBIC   Culture  Setup Time 01/10/2012 14:57   Final    Culture     Final    Value:        BLOOD CULTURE RECEIVED NO GROWTH TO DATE CULTURE WILL BE HELD FOR 5 DAYS BEFORE ISSUING A FINAL NEGATIVE REPORT   Report Status PENDING   Incomplete   CULTURE, BLOOD (ROUTINE X 2)     Status: Normal  (Preliminary result)   Collection Time   01/10/12  3:05 AM      Component Value Range Status Comment   Specimen Description BLOOD R HAND   Final    Special Requests     Final    Value: BOTTLES DRAWN AEROBIC AND ANAEROBIC 5CC AEROBIC/3CC ANAEROBIC   Culture  Setup Time 01/10/2012 14:57   Final    Culture     Final    Value:        BLOOD CULTURE RECEIVED NO GROWTH TO DATE CULTURE WILL BE HELD FOR 5 DAYS BEFORE ISSUING A FINAL NEGATIVE REPORT   Report Status PENDING   Incomplete   MRSA PCR SCREENING     Status: Normal   Collection Time   01/10/12 10:38 AM      Component Value Range Status Comment   MRSA by PCR NEGATIVE  NEGATIVE Final      Studies: No results found.  Scheduled Meds:   . ARIPiprazole  5 mg Oral Daily  . buPROPion  300 mg Oral Daily  . clonazePAM  0.75 mg Oral BID  . enoxaparin (LOVENOX) injection  40 mg Subcutaneous Q24H  . escitalopram  20 mg Oral Daily  . estradiol  2 mg Oral TID  . finasteride  5 mg Oral Daily  . imipenem-cilastatin  500 mg Intravenous Q6H  . nicotine  14 mg Transdermal Daily  . sodium chloride  3 mL Intravenous Q12H  . spironolactone  50 mg Oral Daily  . vancomycin  1,000 mg Intravenous Once  . vancomycin  1,000 mg Intravenous Q12H   Continuous Infusions:   Time spent: 25 minutes.   LOS: 2 days   RAMA,CHRISTINA  [facility] Pager [phone].  If 8PM-8AM, please contact night-coverage at [email or web address], password [facility] 01/11/2012, 10:08 AM

## 2012-01-11 NOTE — Progress Notes (Signed)
ANTIBIOTIC CONSULT NOTE - INITIAL  Pharmacy Consult for vancomycin/primaxin Indication: cellulitis in IVDA  Allergies  Allergen Reactions  . Penicillins     Swollen joints     Patient Measurements: Height: 5\' 6"  (167.6 cm) Weight: 145 lb (65.772 kg) IBW/kg (Calculated) : 63.8  Adjusted Body Weight:   Vital Signs: Temp: 98.6 F (37 C) (09/02 3086) Temp src: Oral (09/02 0613) BP: 112/63 mmHg (09/02 5784) Pulse Rate: 90  (09/02 0613) Intake/Output from previous day: 09/01 0701 - 09/02 0700 In: 360 [P.O.:360] Out: -  Intake/Output from this shift: Total I/O In: 360 [P.O.:360] Out: -   Labs:  Basename 01/11/12 0342 01/10/12 0230  WBC 11.1* 11.0*  HGB 14.3 16.0  PLT 174 202  LABCREA -- --  CREATININE 1.09 1.05   Estimated Creatinine Clearance: 81.3 ml/min (by C-G formula based on Cr of 1.09). No results found for this basename: VANCOTROUGH:2,VANCOPEAK:2,VANCORANDOM:2,GENTTROUGH:2,GENTPEAK:2,GENTRANDOM:2,TOBRATROUGH:2,TOBRAPEAK:2,TOBRARND:2,AMIKACINPEAK:2,AMIKACINTROU:2,AMIKACIN:2, in the last 72 hours   Microbiology: Recent Results (from the past 720 hour(s))  CULTURE, BLOOD (ROUTINE X 2)     Status: Normal (Preliminary result)   Collection Time   01/10/12  2:30 AM      Component Value Range Status Comment   Specimen Description BLOOD RIGHT ANTECUBITAL   Final    Special Requests     Final    Value: BOTTLES DRAWN AEROBIC AND ANAEROBIC 5CC AEROBIC/4CC ANAEROBIC   Culture  Setup Time 01/10/2012 14:57   Final    Culture     Final    Value:        BLOOD CULTURE RECEIVED NO GROWTH TO DATE CULTURE WILL BE HELD FOR 5 DAYS BEFORE ISSUING A FINAL NEGATIVE REPORT   Report Status PENDING   Incomplete   CULTURE, BLOOD (ROUTINE X 2)     Status: Normal (Preliminary result)   Collection Time   01/10/12  3:05 AM      Component Value Range Status Comment   Specimen Description BLOOD R HAND   Final    Special Requests     Final    Value: BOTTLES DRAWN AEROBIC AND ANAEROBIC 5CC  AEROBIC/3CC ANAEROBIC   Culture  Setup Time 01/10/2012 14:57   Final    Culture     Final    Value:        BLOOD CULTURE RECEIVED NO GROWTH TO DATE CULTURE WILL BE HELD FOR 5 DAYS BEFORE ISSUING A FINAL NEGATIVE REPORT   Report Status PENDING   Incomplete   MRSA PCR SCREENING     Status: Normal   Collection Time   01/10/12 10:38 AM      Component Value Range Status Comment   MRSA by PCR NEGATIVE  NEGATIVE Final     Medical History: Past Medical History  Diagnosis Date  . Depression   . Hepatitis C   . 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 Cellulitis  Of  Left Forearm 01/10/2012    From IVDA    Medications:  Scheduled:     . ARIPiprazole  5 mg Oral Daily  . buPROPion  300 mg Oral Daily  . clonazePAM  0.75 mg Oral BID  . enoxaparin (LOVENOX) injection  40 mg Subcutaneous Q24H  . escitalopram  20 mg Oral Daily  . estradiol  2 mg Oral TID  . finasteride  5 mg Oral Daily  . imipenem-cilastatin  500 mg Intravenous Q8H  . nicotine  14 mg Transdermal Daily  . sodium chloride  3 mL Intravenous Q12H  . spironolactone  50 mg Oral Daily  . vancomycin  1,000 mg Intravenous Once  . vancomycin  1,000 mg Intravenous Q12H  . DISCONTD: imipenem-cilastatin  500 mg Intravenous Q6H   Assessment: 41 YOM (transgender taking hormonal therapy) admitted with fever, chills, erythema/swelling of L forearm.  Admits to Heroin use in same arm ~ 1 wk ago. Has h/o joint swelling to PCN, orders to start broad spectrum abx vancomycin/imipenem.  Vancomycin 1gm given in ED at 0325 9/1.   9/1: Blood cx= Pending  Vancomycin: 9/1 >> Imipenem: 9/1 >>  Goal of Therapy:  Vancomycin trough level 10-15 mcg/ml  Plan:  Change Imipenem to 500 mg IV q 8 hours based on updated weight of 65.8 kg Continue vancomycin at current dose. Vanc trough at steady state Will continue to follow renal function  Kaleisha Bhargava, Lorra Hals 01/11/2012,1:49 PM

## 2012-01-12 ENCOUNTER — Inpatient Hospital Stay (HOSPITAL_COMMUNITY): Payer: Medicare Other

## 2012-01-12 ENCOUNTER — Other Ambulatory Visit: Payer: Self-pay | Admitting: Orthopedic Surgery

## 2012-01-12 DIAGNOSIS — IMO0002 Reserved for concepts with insufficient information to code with codable children: Secondary | ICD-10-CM | POA: Diagnosis not present

## 2012-01-12 DIAGNOSIS — L0390 Cellulitis, unspecified: Secondary | ICD-10-CM | POA: Diagnosis not present

## 2012-01-12 MED ORDER — LEVOFLOXACIN 750 MG PO TABS: 750.0000 mg | ORAL_TABLET | Freq: Every day | ORAL | Status: DC

## 2012-01-12 MED ORDER — DOXYCYCLINE HYCLATE 100 MG PO TABS: 100.0000 mg | ORAL_TABLET | Freq: Two times a day (BID) | ORAL | Status: DC

## 2012-01-12 MED ORDER — LEVOFLOXACIN 750 MG PO TABS
750.0000 mg | ORAL_TABLET | Freq: Every day | ORAL | Status: DC
  Administered 2012-01-12: 750 mg via ORAL
  Filled 2012-01-12: qty 1

## 2012-01-12 MED ORDER — OXYCODONE HCL 5 MG PO TABS: 5.0000 mg | ORAL_TABLET | ORAL | Status: DC | PRN

## 2012-01-12 MED ORDER — DOXYCYCLINE HYCLATE 100 MG PO TABS
100.0000 mg | ORAL_TABLET | Freq: Two times a day (BID) | ORAL | Status: DC
  Administered 2012-01-12 (×2): 100 mg via ORAL
  Filled 2012-01-12 (×2): qty 1

## 2012-01-12 MED ORDER — IOHEXOL 300 MG/ML  SOLN
100.0000 mL | Freq: Once | INTRAMUSCULAR | Status: AC | PRN
  Administered 2012-01-12: 100 mL via INTRAVENOUS

## 2012-01-12 NOTE — Progress Notes (Signed)
[facility] PROGRESS NOTE  Randy [name] ZOX:[id number] DOB: [birth year] DOA: 01/09/2012 PCP: No primary provider on file.  Patient confirms he does not have a PCP.  Brief narrative: Randy [name] is an 41 y.o. 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  Assessment/Plan: Principal Problem:  *Cellulitis Of Left Forearm  Admitted for IV antibiotics: Vancomycin to cover gram + organisms including MRSA (negative for nasopharyngeal carriage) and Imipenem to cover gram - organisms.   Will change to oral doxycycline and Levaquin today. Blood cultures x 2 already sent.  Negative to date. MRSA nasal swab negative. HIV non-reactive. Left forearm erythema regressing but still has an area of tense induration, so will check CT of left forearm to rule out deep abscess. If CT negative and he tolerates change in antibiotics to oral therapy, can d/c home 01/13/12. Active Problems:  Recurrent major depression-severe / Anxiety disorder  Continue usual home medications: Abilify, Wellbutrin XL, Klonopin, Lexapro. Polysubstance abuse including IVDA (heroin), cocaine, marijuana  SW consulted for substance abuse counseling. Minimizes use, and declines outpatient treatment. HIV non-reactive. Elevated LFTs / Hepatitis C  LFTs mildly elevated, likely related to history of hepatitis C. Hyponatremia  May be related to SSRI therapy or Spironolactone treatment. Mild. Will monitor. Hormonal imbalance in transgender patient  Continue Estrace and androgen inhibitors (Proscar, Spironolactone). Tobacco Abuse  Tobacco cessation counseling. Nicotine patch ordered.   Code Status: Full  Family Communication: Rabenold,Cynthia Mother [phone] for emergencies.  No family at bedside. Disposition Plan: Home, when stable  Medical Consultants:  None  Other  Consultants:  Social work: Decline substance abuse treatment.  Procedures:  None  Antibiotics:  Imipenem 01/10/12--->01/12/12  Vancomycin 01/10/12--->01/12/12  Doxycycline 01/12/12--->  Levaquin 01/12/12--->  HPI/Subjective: Randy [name] reports wants to go home.  His left forearm erythema and swelling have regressed considerably around the inked margins, but he still has an area of tense induration and erythema on the volar surface.  No nausea, vomiting, diarrhea.  Appetite good.  Objective: Filed Vitals:   01/11/12 0613 01/11/12 1357 01/11/12 2100 01/12/12 0600  BP: 112/63 97/66 122/72 110/64  Pulse: 90 87 92 83  Temp: 98.6 F (37 C) 98.2 F (36.8 C) 99.5 F (37.5 C) 98.9 F (37.2 C)  TempSrc: Oral Oral Oral Oral  Resp: 18 18 20 20   Height:      Weight:      SpO2: 100% 100% 100% 99%    Intake/Output Summary (Last 24 hours) at 01/12/12 0919 Last data filed at 01/11/12 1900  Gross per 24 hour  Intake    600 ml  Output      0 ml  Net    600 ml    Exam: Gen:  NAD Cardiovascular:  RRR, No M/R/G Respiratory: Lungs CTAB Gastrointestinal: Abdomen soft, NT/ND with normal active bowel sounds. Extremities: Left arm less erythematous, no pustules, abscesses or areas of fluctuance noted, but still has tenseness and significant erythema on the volar surface of his forearm.  Data Reviewed: Basic Metabolic Panel:  Lab 01/11/12 5621 01/10/12 0230  NA 132* 133*  K 3.7 3.6  CL 100 96  CO2 24 26  GLUCOSE 96 85  BUN 10 9  CREATININE 1.09 1.05  CALCIUM 8.7 9.4  MG -- --  PHOS -- --   GFR Estimated Creatinine Clearance: 81.3 ml/min (by C-G formula based on Cr of 1.09).  Liver Function Tests:  Lab 01/10/12 0230  AST 49*  ALT 113*  ALKPHOS 62  BILITOT 0.7  PROT 7.4  ALBUMIN 3.7   CBC:  Lab 01/11/12 0342 01/10/12 0230  WBC 11.1* 11.0*  NEUTROABS -- --  HGB 14.3 16.0  HCT 41.6 46.3  MCV 91.0 91.1  PLT 174 202   Microbiology Recent Results (from the past 240 hour(s))    CULTURE, BLOOD (ROUTINE X 2)     Status: Normal (Preliminary result)   Collection Time   01/10/12  2:30 AM      Component Value Range Status Comment   Specimen Description BLOOD RIGHT ANTECUBITAL   Final    Special Requests     Final    Value: BOTTLES DRAWN AEROBIC AND ANAEROBIC 5CC AEROBIC/4CC ANAEROBIC   Culture  Setup Time 01/10/2012 14:57   Final    Culture     Final    Value:        BLOOD CULTURE RECEIVED NO GROWTH TO DATE CULTURE WILL BE HELD FOR 5 DAYS BEFORE ISSUING A FINAL NEGATIVE REPORT   Report Status PENDING   Incomplete   CULTURE, BLOOD (ROUTINE X 2)     Status: Normal (Preliminary result)   Collection Time   01/10/12  3:05 AM      Component Value Range Status Comment   Specimen Description BLOOD R HAND   Final    Special Requests     Final    Value: BOTTLES DRAWN AEROBIC AND ANAEROBIC 5CC AEROBIC/3CC ANAEROBIC   Culture  Setup Time 01/10/2012 14:57   Final    Culture     Final    Value:        BLOOD CULTURE RECEIVED NO GROWTH TO DATE CULTURE WILL BE HELD FOR 5 DAYS BEFORE ISSUING A FINAL NEGATIVE REPORT   Report Status PENDING   Incomplete   MRSA PCR SCREENING     Status: Normal   Collection Time   01/10/12 10:38 AM      Component Value Range Status Comment   MRSA by PCR NEGATIVE  NEGATIVE Final      Studies: No results found.  Scheduled Meds:    . ARIPiprazole  5 mg Oral Daily  . buPROPion  300 mg Oral Daily  . clonazePAM  0.75 mg Oral BID  . enoxaparin (LOVENOX) injection  40 mg Subcutaneous Q24H  . escitalopram  20 mg Oral Daily  . estradiol  2 mg Oral TID  . finasteride  5 mg Oral Daily  . imipenem-cilastatin  500 mg Intravenous Q8H  . nicotine  14 mg Transdermal Daily  . sodium chloride  3 mL Intravenous Q12H  . spironolactone  50 mg Oral Daily  . vancomycin  1,000 mg Intravenous Q12H  . DISCONTD: imipenem-cilastatin  500 mg Intravenous Q6H   Continuous Infusions:   Time spent: 25 minutes.   LOS: 3 days   Taneia Mealor  [facility] Pager [phone].  If 8PM-8AM, please contact night-coverage at [email or web address], password [email or web address] 01/12/2012, 9:19 AM

## 2012-01-12 NOTE — Progress Notes (Addendum)
Came to patient's room to inform him of the CT scan findings, and need for surgical consultation.  No one in room.  Nursing staff did not see him leave, and he did not receive any prescriptions prior to leaving.  I called his home number, and he answered the phone stating he left because "no one would give him his prescriptions" or tell him the results of the CT scan.  Informed him that the CT scan was positive for an abscess and that it would need to be surgically drained.  Agreed to return to the hospital.  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  Spoke with Dr. Johna Sheriff about a consult, but after reviewing his scans, felt that the patient needed the expertise of an orthopedic surgeon.  Spoke with Dr. Montez Morita who will see the patient when he returns to his room.  Anyeli Hockenbury 01/12/2012 5:16 PM

## 2012-01-12 NOTE — Progress Notes (Signed)
When Dr. Darnelle Catalan came to discuss the CT scan findings with the patient, we realized that the patient had left his room.  Prior to him leaving he kept asking how long he would have to remain here and when were his CT results going to be back so he could leave.  I tried to explain that Dr. Darnelle Catalan wanted to wait until CT results were back before he was discharged.  Patient was very agitated and irritable about the delay.  None of our staff saw patient leave the unit.  Dr. Darnelle Catalan did reach the patient at home and discussed the situation with him and he is supposed to be returning to the hospital to his room.  Patient did leave with a NSL IV in place.  Notified Billee Cashing, Hospital Administrative Coordinator of this event.  Allayne Butcher [facility]  01/12/2012  5:37 PM

## 2012-01-12 NOTE — Progress Notes (Signed)
Patient insisting on being discharged home despite Dr. Darnelle Catalan educating him that he needed to stay at least until tomorrow.  Dr. Darnelle Catalan notified and she will come see pt.  Randy [name] [facility]  01/12/2012

## 2012-01-12 NOTE — Progress Notes (Signed)
Patient returned to his room.  Patient apologized for leaving without informing the nurse or MD.  Patient stated he does not like being in the hospital and has a hard time being here since he dealt with his dad's death a few years ago.  Listened to patient's fears and concerns and offered support.  Patient informed of the plan of care for orthopedic consult and he verbalized understanding.  Allayne Butcher [facility]  01/12/2012

## 2012-01-12 NOTE — Progress Notes (Signed)
Patient ID: Randy [name], male   DOB: 09-25-1970, 41 y.o.   MRN: [id number] This is a 41y/o male scene for abscess to the left forearm intramusculr ,plan is for i&d left forearm tomorrow. Orders have boon placed. Will follow.

## 2012-01-13 ENCOUNTER — Inpatient Hospital Stay (HOSPITAL_COMMUNITY): Payer: Medicare Other | Admitting: *Deleted

## 2012-01-13 ENCOUNTER — Encounter (HOSPITAL_COMMUNITY): Payer: Self-pay | Admitting: *Deleted

## 2012-01-13 ENCOUNTER — Inpatient Hospital Stay (HOSPITAL_COMMUNITY): Payer: Medicare Other

## 2012-01-13 ENCOUNTER — Inpatient Hospital Stay (HOSPITAL_COMMUNITY)
Admission: AD | Admit: 2012-01-13 | Discharge: 2012-01-15 | DRG: 581 | Disposition: A | Discharge status: Home / Self Care | Payer: Medicare Other | Source: Ambulatory Visit | Attending: Internal Medicine | Admitting: Internal Medicine

## 2012-01-13 ENCOUNTER — Encounter (HOSPITAL_COMMUNITY)
Admission: AD | Disposition: A | Discharge status: Home / Self Care | Payer: Self-pay | Source: Ambulatory Visit | Attending: Internal Medicine

## 2012-01-13 ENCOUNTER — Encounter (HOSPITAL_COMMUNITY): Payer: Self-pay

## 2012-01-13 DIAGNOSIS — F329 Major depressive disorder, single episode, unspecified: Secondary | ICD-10-CM | POA: Diagnosis present

## 2012-01-13 DIAGNOSIS — F3289 Other specified depressive episodes: Secondary | ICD-10-CM | POA: Diagnosis not present

## 2012-01-13 DIAGNOSIS — E349 Endocrine disorder, unspecified: Secondary | ICD-10-CM | POA: Diagnosis present

## 2012-01-13 DIAGNOSIS — B1920 Unspecified viral hepatitis C without hepatic coma: Secondary | ICD-10-CM | POA: Diagnosis not present

## 2012-01-13 DIAGNOSIS — F1410 Cocaine abuse, uncomplicated: Secondary | ICD-10-CM

## 2012-01-13 DIAGNOSIS — F419 Anxiety disorder, unspecified: Secondary | ICD-10-CM

## 2012-01-13 DIAGNOSIS — F17200 Nicotine dependence, unspecified, uncomplicated: Secondary | ICD-10-CM | POA: Diagnosis present

## 2012-01-13 DIAGNOSIS — Z79899 Other long term (current) drug therapy: Secondary | ICD-10-CM | POA: Diagnosis not present

## 2012-01-13 DIAGNOSIS — Z01818 Encounter for other preprocedural examination: Secondary | ICD-10-CM | POA: Diagnosis not present

## 2012-01-13 DIAGNOSIS — IMO0002 Reserved for concepts with insufficient information to code with codable children: Secondary | ICD-10-CM | POA: Diagnosis not present

## 2012-01-13 DIAGNOSIS — M7989 Other specified soft tissue disorders: Secondary | ICD-10-CM | POA: Diagnosis not present

## 2012-01-13 DIAGNOSIS — J984 Other disorders of lung: Secondary | ICD-10-CM | POA: Diagnosis not present

## 2012-01-13 DIAGNOSIS — F411 Generalized anxiety disorder: Secondary | ICD-10-CM

## 2012-01-13 DIAGNOSIS — F332 Major depressive disorder, recurrent severe without psychotic features: Secondary | ICD-10-CM

## 2012-01-13 DIAGNOSIS — Z720 Tobacco use: Secondary | ICD-10-CM

## 2012-01-13 DIAGNOSIS — E871 Hypo-osmolality and hyponatremia: Secondary | ICD-10-CM

## 2012-01-13 DIAGNOSIS — F1910 Other psychoactive substance abuse, uncomplicated: Secondary | ICD-10-CM

## 2012-01-13 DIAGNOSIS — L03119 Cellulitis of unspecified part of limb: Secondary | ICD-10-CM

## 2012-01-13 HISTORY — PX: I & D EXTREMITY: SHX5045

## 2012-01-13 LAB — DIFFERENTIAL
Basophils Absolute: 0 10*3/uL (ref 0.0–0.1)
Basophils Relative: 0 % (ref 0–1)
Eosinophils Absolute: 0.1 10*3/uL (ref 0.0–0.7)
Neutro Abs: 7.7 10*3/uL (ref 1.7–7.7)
Neutrophils Relative %: 69 % (ref 43–77)

## 2012-01-13 LAB — CBC
HCT: 40.5 % (ref 39.0–52.0)
Hemoglobin: 13.9 g/dL (ref 13.0–17.0)
MCV: 89.8 fL (ref 78.0–100.0)
Platelets: 224 10*3/uL (ref 150–400)
RBC: 4.51 MIL/uL (ref 4.22–5.81)
RDW: 13 % (ref 11.5–15.5)
WBC: 11.1 10*3/uL — ABNORMAL HIGH (ref 4.0–10.5)

## 2012-01-13 LAB — COMPREHENSIVE METABOLIC PANEL
ALT: 62 U/L — ABNORMAL HIGH (ref 0–53)
AST: 37 U/L (ref 0–37)
CO2: 24 mEq/L (ref 19–32)
Calcium: 9.4 mg/dL (ref 8.4–10.5)
GFR calc non Af Amer: >90 mL/min (ref >90)
Sodium: 133 mEq/L — ABNORMAL LOW (ref 135–145)

## 2012-01-13 LAB — APTT: aPTT: 34 seconds (ref 24–37)

## 2012-01-13 LAB — PROTIME-INR
INR: 1.09 (ref 0.00–1.49)
Prothrombin Time: 14.3 seconds (ref 11.6–15.2)

## 2012-01-13 SURGERY — IRRIGATION AND DEBRIDEMENT EXTREMITY
Anesthesia: General | Site: Arm Lower | Laterality: Left | Wound class: Dirty or Infected

## 2012-01-13 MED ORDER — ACETAMINOPHEN 325 MG PO TABS: 650.0000 mg | ORAL_TABLET | Freq: Four times a day (QID) | ORAL | Status: DC | PRN

## 2012-01-13 MED ORDER — SODIUM CHLORIDE 0.9 % IV SOLN
500.0000 mg | Freq: Three times a day (TID) | INTRAVENOUS | Status: DC
  Administered 2012-01-13 – 2012-01-15 (×6): 500 mg via INTRAVENOUS
  Filled 2012-01-13 (×6): qty 500

## 2012-01-13 MED ORDER — FENTANYL CITRATE 0.05 MG/ML IJ SOLN
INTRAMUSCULAR | Status: AC
  Filled 2012-01-13: qty 2

## 2012-01-13 MED ORDER — SPIRONOLACTONE 50 MG PO TABS
50.0000 mg | ORAL_TABLET | Freq: Every day | ORAL | Status: DC
  Administered 2012-01-13 – 2012-01-15 (×3): 50 mg via ORAL
  Filled 2012-01-13 (×3): qty 1

## 2012-01-13 MED ORDER — LACTATED RINGERS IV SOLN
INTRAVENOUS | Status: DC
  Administered 2012-01-13: 20:00:00 via INTRAVENOUS

## 2012-01-13 MED ORDER — PROPOFOL 10 MG/ML IV BOLUS
INTRAVENOUS | Status: DC | PRN
  Administered 2012-01-13: 200 mg via INTRAVENOUS

## 2012-01-13 MED ORDER — ONDANSETRON HCL 4 MG PO TABS: 4.0000 mg | ORAL_TABLET | Freq: Four times a day (QID) | ORAL | Status: DC | PRN

## 2012-01-13 MED ORDER — ARIPIPRAZOLE 5 MG PO TABS
5.0000 mg | ORAL_TABLET | Freq: Every day | ORAL | Status: DC
  Administered 2012-01-13 – 2012-01-15 (×3): 5 mg via ORAL
  Filled 2012-01-13 (×3): qty 1

## 2012-01-13 MED ORDER — SODIUM CHLORIDE 0.9 % IR SOLN
Status: DC | PRN
  Administered 2012-01-13: 1000 mL

## 2012-01-13 MED ORDER — FENTANYL CITRATE 0.05 MG/ML IJ SOLN
INTRAMUSCULAR | Status: DC | PRN
  Administered 2012-01-13 (×2): 50 ug via INTRAVENOUS

## 2012-01-13 MED ORDER — VANCOMYCIN HCL IN DEXTROSE 1-5 GM/200ML-% IV SOLN
INTRAVENOUS | Status: AC
  Filled 2012-01-13: qty 200

## 2012-01-13 MED ORDER — BUPROPION HCL ER (XL) 300 MG PO TB24
300.0000 mg | ORAL_TABLET | Freq: Every day | ORAL | Status: DC
  Administered 2012-01-13 – 2012-01-15 (×3): 300 mg via ORAL
  Filled 2012-01-13 (×3): qty 1

## 2012-01-13 MED ORDER — ESCITALOPRAM OXALATE 20 MG PO TABS
20.0000 mg | ORAL_TABLET | Freq: Every day | ORAL | Status: DC
  Administered 2012-01-13 – 2012-01-15 (×3): 20 mg via ORAL
  Filled 2012-01-13 (×3): qty 1

## 2012-01-13 MED ORDER — DOXYCYCLINE HYCLATE 100 MG IV SOLR
100.0000 mg | Freq: Two times a day (BID) | INTRAVENOUS | Status: DC
  Filled 2012-01-13 (×2): qty 100

## 2012-01-13 MED ORDER — ONDANSETRON HCL 4 MG/2ML IJ SOLN: 4.0000 mg | Freq: Four times a day (QID) | INTRAMUSCULAR | Status: DC | PRN

## 2012-01-13 MED ORDER — ONDANSETRON HCL 4 MG/2ML IJ SOLN
INTRAMUSCULAR | Status: DC | PRN
  Administered 2012-01-13: 4 mg via INTRAVENOUS

## 2012-01-13 MED ORDER — FENTANYL CITRATE 0.05 MG/ML IJ SOLN
25.0000 ug | INTRAMUSCULAR | Status: DC | PRN
  Administered 2012-01-13 (×3): 50 ug via INTRAVENOUS

## 2012-01-13 MED ORDER — ACETAMINOPHEN 650 MG RE SUPP: 650.0000 mg | Freq: Four times a day (QID) | RECTAL | Status: DC | PRN

## 2012-01-13 MED ORDER — FENTANYL CITRATE 0.05 MG/ML IJ SOLN
50.0000 ug | INTRAMUSCULAR | Status: DC | PRN
  Administered 2012-01-13: 50 ug via INTRAVENOUS

## 2012-01-13 MED ORDER — LIDOCAINE HCL (CARDIAC) 20 MG/ML IV SOLN
INTRAVENOUS | Status: DC | PRN
  Administered 2012-01-13: 100 mg via INTRAVENOUS

## 2012-01-13 MED ORDER — LACTATED RINGERS IV SOLN
INTRAVENOUS | Status: DC | PRN
  Administered 2012-01-13: 17:00:00 via INTRAVENOUS

## 2012-01-13 MED ORDER — VANCOMYCIN HCL IN DEXTROSE 1-5 GM/200ML-% IV SOLN
1000.0000 mg | Freq: Three times a day (TID) | INTRAVENOUS | Status: DC
  Administered 2012-01-13 – 2012-01-15 (×6): 1000 mg via INTRAVENOUS
  Filled 2012-01-13 (×6): qty 200

## 2012-01-13 MED ORDER — MORPHINE SULFATE 2 MG/ML IJ SOLN
1.0000 mg | INTRAMUSCULAR | Status: DC | PRN
  Administered 2012-01-13 – 2012-01-14 (×2): 1 mg via INTRAVENOUS
  Filled 2012-01-13 (×2): qty 1

## 2012-01-13 MED ORDER — SODIUM CHLORIDE 0.9 % IR SOLN
Status: DC | PRN
  Administered 2012-01-13: 18:00:00

## 2012-01-13 MED ORDER — BUPIVACAINE HCL (PF) 0.5 % IJ SOLN
INTRAMUSCULAR | Status: AC
Start: 2012-01-13 — End: 2012-01-13
  Filled 2012-01-13: qty 30

## 2012-01-13 MED ORDER — CHLORHEXIDINE GLUCONATE 4 % EX LIQD
60.0000 mL | Freq: Once | CUTANEOUS | Status: AC
  Administered 2012-01-13: 4 via TOPICAL
  Filled 2012-01-13: qty 60

## 2012-01-13 MED ORDER — LACTATED RINGERS IV SOLN: INTRAVENOUS | Status: DC

## 2012-01-13 MED ORDER — FINASTERIDE 5 MG PO TABS
5.0000 mg | ORAL_TABLET | Freq: Every day | ORAL | Status: DC
  Administered 2012-01-14 – 2012-01-15 (×2): 5 mg via ORAL
  Filled 2012-01-13 (×3): qty 1

## 2012-01-13 MED ORDER — OXYCODONE HCL 5 MG PO TABS
5.0000 mg | ORAL_TABLET | ORAL | Status: DC | PRN
  Administered 2012-01-14 (×2): 5 mg via ORAL
  Filled 2012-01-13 (×3): qty 1

## 2012-01-13 MED ORDER — ESTRADIOL 2 MG PO TABS
2.0000 mg | ORAL_TABLET | Freq: Three times a day (TID) | ORAL | Status: DC
  Administered 2012-01-13 – 2012-01-15 (×6): 2 mg via ORAL
  Filled 2012-01-13 (×9): qty 1

## 2012-01-13 MED ORDER — CLONAZEPAM 0.5 MG PO TABS
0.7500 mg | ORAL_TABLET | Freq: Two times a day (BID) | ORAL | Status: DC
  Administered 2012-01-13 – 2012-01-15 (×5): 0.75 mg via ORAL
  Filled 2012-01-13: qty 1
  Filled 2012-01-13: qty 2
  Filled 2012-01-13: qty 1
  Filled 2012-01-13 (×2): qty 2
  Filled 2012-01-13: qty 1
  Filled 2012-01-13: qty 2

## 2012-01-13 SURGICAL SUPPLY — 35 items
BAG SPEC THK2 15X12 ZIP CLS (MISCELLANEOUS) ×1
BAG ZIPLOCK 12X15 (MISCELLANEOUS) ×2 IMPLANT
BANDAGE ELASTIC 6 VELCRO ST LF (GAUZE/BANDAGES/DRESSINGS) ×1 IMPLANT
BANDAGE ESMARK 6X9 LF (GAUZE/BANDAGES/DRESSINGS) ×1 IMPLANT
BANDAGE GAUZE ELAST BULKY 4 IN (GAUZE/BANDAGES/DRESSINGS) ×4 IMPLANT
BNDG CMPR 9X6 STRL LF SNTH (GAUZE/BANDAGES/DRESSINGS) ×1
BNDG ESMARK 6X9 LF (GAUZE/BANDAGES/DRESSINGS) ×2
CLOTH BEACON ORANGE TIMEOUT ST (SAFETY) ×2 IMPLANT
CUFF TOURN SGL QUICK 18 (TOURNIQUET CUFF) ×2 IMPLANT
CUFF TOURN SGL QUICK 24 (TOURNIQUET CUFF) ×2
CUFF TOURN SGL QUICK 34 (TOURNIQUET CUFF) ×2
CUFF TRNQT CYL 24X4X40X1 (TOURNIQUET CUFF) ×1 IMPLANT
CUFF TRNQT CYL 34X4X40X1 (TOURNIQUET CUFF) ×1 IMPLANT
DRAIN PENROSE 18X1/2 LTX STRL (DRAIN) ×2 IMPLANT
DRSG ADAPTIC 3X8 NADH LF (GAUZE/BANDAGES/DRESSINGS) ×1 IMPLANT
DRSG PAD ABDOMINAL 8X10 ST (GAUZE/BANDAGES/DRESSINGS) ×3 IMPLANT
DURAPREP 26ML APPLICATOR (WOUND CARE) ×2 IMPLANT
ELECT REM PT RETURN 9FT ADLT (ELECTROSURGICAL) ×2
ELECTRODE REM PT RTRN 9FT ADLT (ELECTROSURGICAL) ×1 IMPLANT
GLOVE SURG ORTHO 9.0 STRL STRW (GLOVE) ×2 IMPLANT
HANDPIECE INTERPULSE COAX TIP (DISPOSABLE) ×2
KIT BASIN OR (CUSTOM PROCEDURE TRAY) ×2 IMPLANT
MANIFOLD NEPTUNE II (INSTRUMENTS) ×2 IMPLANT
PACK LOWER EXTREMITY WL (CUSTOM PROCEDURE TRAY) ×2 IMPLANT
PAD CAST 4YDX4 CTTN HI CHSV (CAST SUPPLIES) ×1 IMPLANT
PADDING CAST COTTON 4X4 STRL (CAST SUPPLIES) ×2
POSITIONER SURGICAL ARM (MISCELLANEOUS) ×2 IMPLANT
SET HNDPC FAN SPRY TIP SCT (DISPOSABLE) ×1 IMPLANT
SOL PREP POV-IOD 16OZ 10% (MISCELLANEOUS) ×2 IMPLANT
SOL PREP PROV IODINE SCRUB 4OZ (MISCELLANEOUS) ×2 IMPLANT
SPONGE GAUZE 4X4 12PLY (GAUZE/BANDAGES/DRESSINGS) ×2 IMPLANT
STAPLER VISISTAT (STAPLE) ×1 IMPLANT
SUT VIC AB 2-0 CT1 27 (SUTURE) ×2
SUT VIC AB 2-0 CT1 TAPERPNT 27 (SUTURE) IMPLANT
SYR CONTROL 10ML LL (SYRINGE) ×2 IMPLANT

## 2012-01-13 NOTE — Consult Note (Signed)
Randy [name], EBER                 ACCOUNT NO.:  [id number]  MEDICAL RECORD NO.:  [id number]  LOCATION:                               FACILITY:  [facility]  PHYSICIAN:  Myrtie Neither, MD      DATE OF BIRTH:  [birth year]  DATE OF CONSULTATION:  01/09/2012 DATE OF DISCHARGE:                                CONSULTATION   ADMITTING DOCTOR:  Dr. Salena Saner Rama.  REASON FOR CONSULTATION:  Abscess in the left forearm.  HISTORY OF PRESENT ILLNESS:  This is a 41 year old male who states that last Wednesday he noticed a little spot on the left forearm which progressively worsened with redness, pain, and fever toward which progressively worsened over the past weekend.  The patient came in today to [facility] Emergency Room for treatment.  The patient was admitted for cellulitis of the left forearm and CT scan revealed abscess in the left forearm.  The patient denies any fever, chills, or night sweats. No urinary or bowel symptoms.  ALLERGIES:  PENICILLIN.  PAST MEDICAL HISTORY:  Anxiety disorder, polysubstance abuse, heroin, cocaine, marijuana, major depression, severe.  Elevated __________ hepatitis C, hyponatremia, hormonal imbalance __________.  SOCIAL HISTORY:  Also includes tobacco abuse.  The patient states that he only has occasional use of alcohol.  FAMILY HISTORY:  No history of high blood pressure, diabetes.  No history of cancer.  Father did have polycystic fibrosis of the lung.  PHYSICAL EXAMINATION:  GENERAL:  Alert and oriented.  No acute distress. VITAL SIGNS:  Temperature 98.7, pulse 87, blood pressure 107/67, respirations 18, and O2 saturation 99%.  Patient is thin, approximately 5 feet 7 inches. EXTREMITIES:  Left forearm increased warmth and redness over the volar aspect of the forearm, erythema is encompassing about 90% of the volar aspect of the forearm with palpable from this and __________ of the forearm.  Tender to palpation.  Negative Tinel sign.  Good grip, intrinsics  intact.  The patient also does have history of carpal tunnel syndrome.  LABORATORY DATA:  Sodium is 132.  CBC; white cell count is 11.1, hemoglobin of 14.3, and hematocrit of 41.6.  HIV is nonreactive.  MRSA test is also negative.  IMAGING:  X-ray revealed soft tissue swelling.  CT scan of the left forearm reveals intramuscular abscess measuring 8.3 x 2.6 x 2 cm extends from __________ cm below the elbow to 6 cm above the wrist.  __________ subcutaneous fat consistent with cellulitis also.  Impression was that of a large intramuscular abscess left flexor pollicis longus muscle in the mid forearm.  IMPRESSION:  Abscess, left forearm.  PLAN:  Surgical I and D of the left forearm.  Presently patient is on IV antibiotics, we will continue.  We will place the patient n.p.o. after midnight and we will schedule tomorrow when the space becomes available.     Myrtie Neither, MD     AC/MEDQ  D:  01/12/2012  T:  01/13/2012  Job:  [id number]

## 2012-01-13 NOTE — Progress Notes (Signed)
Pt. Has a difficult time waking up, and does not open his eyes when being spoken to or asked a question. When he does respond he is very slow. He is NPO for I & D of his Lt forearm. It is edematous from wrist to elbow.

## 2012-01-13 NOTE — Progress Notes (Signed)
ANTIBIOTIC CONSULT NOTE - INITIAL  Pharmacy Consult for Vanco and Primaxin Indication: Left Forearm Abscess  Allergies  Allergen Reactions  . Penicillins     Swollen joints     Patient Measurements: Height: 5\' 6"  (167.6 cm) IBW/kg (Calculated) : 63.8   Vital Signs: Temp: 97.3 F (36.3 C) (09/04 0947) BP: 112/68 mmHg (09/04 0947) Pulse Rate: 86  (09/04 0947) Intake/Output from previous day:   Intake/Output from this shift:    Labs:  Basename 01/13/12 1031 01/11/12 0342  WBC 11.1* 11.1*  HGB 13.9 14.3  PLT 224 174  LABCREA -- --  CREATININE 0.97 1.09   The CrCl is unknown because both a height and weight (above a minimum accepted value) are required for this calculation. No results found for this basename: VANCOTROUGH:2,VANCOPEAK:2,VANCORANDOM:2,GENTTROUGH:2,GENTPEAK:2,GENTRANDOM:2,TOBRATROUGH:2,TOBRAPEAK:2,TOBRARND:2,AMIKACINPEAK:2,AMIKACINTROU:2,AMIKACIN:2, in the last 72 hours   Microbiology: Recent Results (from the past 720 hour(s))  CULTURE, BLOOD (ROUTINE X 2)     Status: Normal (Preliminary result)   Collection Time   01/10/12  2:30 AM      Component Value Range Status Comment   Specimen Description BLOOD RIGHT ANTECUBITAL   Final    Special Requests     Final    Value: BOTTLES DRAWN AEROBIC AND ANAEROBIC 5CC AEROBIC/4CC ANAEROBIC   Culture  Setup Time 01/10/2012 14:57   Final    Culture     Final    Value:        BLOOD CULTURE RECEIVED NO GROWTH TO DATE CULTURE WILL BE HELD FOR 5 DAYS BEFORE ISSUING A FINAL NEGATIVE REPORT   Report Status PENDING   Incomplete   CULTURE, BLOOD (ROUTINE X 2)     Status: Normal (Preliminary result)   Collection Time   01/10/12  3:05 AM      Component Value Range Status Comment   Specimen Description BLOOD R HAND   Final    Special Requests     Final    Value: BOTTLES DRAWN AEROBIC AND ANAEROBIC 5CC AEROBIC/3CC ANAEROBIC   Culture  Setup Time 01/10/2012 14:57   Final    Culture     Final    Value:        BLOOD CULTURE  RECEIVED NO GROWTH TO DATE CULTURE WILL BE HELD FOR 5 DAYS BEFORE ISSUING A FINAL NEGATIVE REPORT   Report Status PENDING   Incomplete   MRSA PCR SCREENING     Status: Normal   Collection Time   01/10/12 10:38 AM      Component Value Range Status Comment   MRSA by PCR NEGATIVE  NEGATIVE Final     Medical History: Past Medical History  Diagnosis Date  . Depression   . Hepatitis C   . 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 Cellulitis  Of  Left Forearm 01/10/2012    From IVDA    Medications:  Anti-infectives     Start     Dose/Rate Route Frequency Ordered Stop   01/13/12 1200   doxycycline (VIBRAMYCIN) 100 mg in dextrose 5 % 250 mL IVPB  Status:  Discontinued        100 mg 125 mL/hr over 120 Minutes Intravenous Every 12 hours 01/13/12 1000 01/13/12 1449         Assessment: 41 yo patient admitted for L forearm abscess. The patient admits to injecting heroin into his left forearm about 1 week ago. Pt reportedly left hospital and then came back all on 01/12/12. Plan is to re-start empiric Vanco and Primaxin and plan  an I&D per ortho. Will aim for higher trough for now until extent of abscess is known. Wt=65.8kg, CrCl(n) >180ml/min.  Goal of Therapy:  Vancomycin trough level 15-20 mcg/ml  Plan:  1) Vanco 1g IV q8h 2) Primaxin 500mg  IV q8h 3) Vanco trough at steady state 4) F/U extent of abscess vs goal Vanco trough  Darrol Angel, PharmD Pager: [phone] 01/13/2012,2:59 PM

## 2012-01-13 NOTE — Transfer of Care (Signed)
Immediate Anesthesia Transfer of Care Note  Patient: Randy [name]  Procedure(s) Performed: Procedure(s) (LRB) with comments: IRRIGATION AND DEBRIDEMENT EXTREMITY (Left)  Patient Location: PACU  Anesthesia Type: General  Level of Consciousness: awake, alert  and oriented  Airway & Oxygen Therapy: Patient Spontanous Breathing and Patient connected to face mask oxygen  Post-op Assessment: Report given to PACU RN and Post -op Vital signs reviewed and stable  Post vital signs: Reviewed and stable  Complications: No apparent anesthesia complications

## 2012-01-13 NOTE — H&P (Signed)
[facility] History and Physical  Armstead Heiland NWG:[id number] DOB: 03/06/1971 DOA: 01/13/2012  Referring physician: ER physician PCP: No primary provider on file.   Chief Complaint: left forearm abscess  HPI:  41 year old male who was admitted for left forearm swelling, erythema and redness started over past few days and has progressively gotten worse. Patient reports pain at this site and states pain is worse with movement. Pain is 5-6/10 in intensity. No complains of fever or chills, no abdominal pain, no nausea or vomiting. No apparent tick or insect bites.    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 Negative for diaphoresis.  HENT: Negative for hearing loss, ear pain, nosebleeds, congestion, sore throat, neck pain, tinnitus and ear discharge.   Eyes: Negative for blurred vision, double vision, photophobia, pain, discharge and redness.  Respiratory: Negative for cough, hemoptysis, sputum production, shortness of breath, wheezing and stridor.   Cardiovascular: Negative for chest pain, palpitations, orthopnea, claudication and leg swelling.  Gastrointestinal: Negative for nausea, vomiting and abdominal pain. Negative for heartburn, constipation, blood in stool and melena.  Genitourinary: Negative for dysuria, urgency, frequency, hematuria and flank pain.  Musculoskeletal: Negative for myalgias, back pain, joint pain  and falls.  Skin: per HPI Neurological: Negative for dizziness and weakness. Negative for tingling, tremors, sensory change, speech change, focal weakness, loss of consciousness and headaches.  Endo/Heme/Allergies: Negative for environmental allergies and polydipsia. Does not bruise/bleed easily.  Psychiatric/Behavioral: Negative for suicidal ideas. The patient is not nervous/anxious.      Past Medical History  Diagnosis Date  . Depression   . Hepatitis C   . 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 Cellulitis  Of  Left Forearm 01/10/2012    From IVDA   No past surgical history on file. Social History:  reports that he has been smoking.  He has never used smokeless tobacco. He reports that he drinks about 25.8 ounces of alcohol per week. He reports that he uses illicit drugs (Cocaine, Marijuana, and Heroin) about 7 times per week.  Allergies  Allergen Reactions  . Penicillins     Swollen joints     Family History: heart disease in parents  Prior to Admission medications   Medication Sig Start Date End Date Taking? Authorizing Provider  ARIPiprazole (ABILIFY) 5 MG tablet Take 5 mg by mouth daily. thoughts 08/17/11   Mike Craze, MD  buPROPion (WELLBUTRIN XL) 150 MG 24 hr tablet Take 300 mg by mouth daily. For depression 08/17/11   Mike Craze, MD  clonazePAM (KLONOPIN) 0.5 MG tablet Take 0.75 mg by mouth 2 (two) times daily. For anxiety 08/17/11   Mike Craze, MD  doxycycline (VIBRA-TABS) 100 MG tablet Take 1 tablet (100 mg total) by mouth every 12 (twelve) hours. 01/12/12 01/22/12  Christina P Rama, MD  escitalopram (LEXAPRO) 20 MG tablet Take 20 mg by mouth daily. For depression/anxiety 08/17/11   Mike Craze, MD  estradiol (ESTRACE) 2 MG tablet Take 2 mg by mouth 3 (three)  times daily. For hormone supplement 08/17/11   Mike Craze, MD  finasteride (PROSCAR) 5 MG tablet Take 5 mg by mouth daily. For hair supplementation 08/17/11   Mike Craze, MD  levofloxacin  (LEVAQUIN) 750 MG tablet Take 1 tablet (750 mg total) by mouth daily. 01/12/12 01/22/12  Christina P Rama, MD  oxyCODONE (OXY IR/ROXICODONE) 5 MG immediate release tablet Take 1 tablet (5 mg total) by mouth every 4 (four) hours as needed. 01/12/12 01/22/12  Maryruth Bun Rama, MD  spironolactone (ALDACTONE) 100 MG tablet Take 50 mg by mouth daily. For hormone supplement 08/17/11   Mike Craze, MD   Physical Exam: Filed Vitals:   01/13/12 0947  BP: 112/68  Pulse: 86  Temp: 97.3 F (36.3 C)  Resp: 18  Height: 5\' 6"  (1.676 m)  SpO2: 99%    Physical Exam  Constitutional: Appears well-developed and well-nourished. No distress.  HENT: Normocephalic. External right and left ear normal. Oropharynx is clear and moist.  Eyes: Conjunctivae and EOM are normal. PERRLA, no scleral icterus.  Neck: Normal ROM. Neck supple. No JVD. No tracheal deviation. No thyromegaly.  CVS: RRR, S1/S2 +, no murmurs, no gallops, no carotid bruit.  Pulmonary: Effort and breath sounds normal, no stridor, rhonchi, wheezes, rales.  Abdominal: Soft. BS +,  no distension, tenderness, rebound or guarding.  Musculoskeletal: Normal range of motion. No edema and no tenderness.  Lymphadenopathy: No lymphadenopathy noted, cervical, inguinal. Neuro: Alert. Normal reflexes, muscle tone coordination. No cranial nerve deficit. Skin: Skin is warm and dry. Left forearm erythema, swelling, tenderness Psychiatric: Normal mood and affect. Behavior, judgment, thought content normal.   Labs on Admission:  Basic Metabolic Panel:  Lab 01/11/12 1610 01/10/12 0230  NA 132* 133*  K 3.7 3.6  CL 100 96  CO2 24 26  GLUCOSE 96 85  BUN 10 9  CREATININE 1.09 1.05  CALCIUM 8.7 9.4  MG -- --  PHOS -- --   Liver Function Tests:  Lab 01/10/12 0230  AST 49*  ALT 113*  ALKPHOS 62  BILITOT 0.7  PROT 7.4  ALBUMIN 3.7   No results found for this basename: LIPASE:5,AMYLASE:5 in the last 168 hours No results found for this basename: AMMONIA:5  in the last 168 hours CBC:  Lab 01/11/12 0342 01/10/12 0230  WBC 11.1* 11.0*  NEUTROABS -- --  HGB 14.3 16.0  HCT 41.6 46.3  MCV 91.0 91.1  PLT 174 202   Cardiac Enzymes: No results found for this basename: CKTOTAL:5,CKMB:5,CKMBINDEX:5,TROPONINI:5 in the last 168 hours BNP: No components found with this basename: POCBNP:5 CBG: No results found for this basename: GLUCAP:5 in the last 168 hours  Radiological Exams on Admission: Ct Forearm Left W Contrast  01/12/2012  *RADIOLOGY REPORT*  Clinical Data: Cellulitis of the left forearm.  CT OF THE LEFT FOREARM WITH CONTRAST  Technique:  Multidetector CT imaging was performed following the standard protocol during bolus administration of intravenous contrast.  Contrast: OMNIPAQUE IOHEXOL 300 MG/ML  SOLN  Comparison: None.  Findings: There is an intramuscular abscess in the flexor pollicis longus muscle of the right forearm.  The intramuscular abscess measures 8.3 x 2.6 x 2.0 cm. The abscess extends from 9 cm below the elbow joint to 6 cm above the wrist joint.  The patient has a circumferential edema in the subcutaneous fat of the forearm consistent with cellulitis.  No visible osteomyelitis.  IMPRESSION: Large intramuscular abscess in the flexor pollicis longus muscle of the mid forearm.  Original Report Authenticated By: Gwynn Burly, M.D.

## 2012-01-13 NOTE — Preoperative (Signed)
Beta Blockers   Reason not to administer Beta Blockers:Not Applicable 

## 2012-01-13 NOTE — Anesthesia Preprocedure Evaluation (Addendum)
Anesthesia Evaluation  Patient identified by MRN, date of birth, ID band Patient awake    Reviewed: Allergy & Precautions, H&P , NPO status , Patient's Chart, lab work & pertinent test results  Airway Mallampati: II TM Distance: >3 FB Neck ROM: full    Dental   Pulmonary neg pulmonary ROS, Current Smoker,  breath sounds clear to auscultation  Pulmonary exam normal       Cardiovascular Exercise Tolerance: Good negative cardio ROS  Rhythm:regular Rate:Normal     Neuro/Psych Depression negative neurological ROS  negative psych ROS   GI/Hepatic negative GI ROS, Neg liver ROS, (+)     substance abuse  cocaine use and IV drug use, Hepatitis -, CIncreased LFTs   Endo/Other  negative endocrine ROS  Renal/GU negative Renal ROS  negative genitourinary   Musculoskeletal   Abdominal   Peds  Hematology negative hematology ROS (+)   Anesthesia Other Findings   Reproductive/Obstetrics negative OB ROS                          Anesthesia Physical Anesthesia Plan  ASA: III  Anesthesia Plan: General   Post-op Pain Management:    Induction: Intravenous  Airway Management Planned: LMA  Additional Equipment:   Intra-op Plan:   Post-operative Plan:   Informed Consent: I have reviewed the patients History and Physical, chart, labs and discussed the procedure including the risks, benefits and alternatives for the proposed anesthesia with the patient or authorized representative who has indicated his/her understanding and acceptance.   Dental Advisory Given  Plan Discussed with: CRNA and Surgeon  Anesthesia Plan Comments:         Anesthesia Quick Evaluation

## 2012-01-13 NOTE — Anesthesia Postprocedure Evaluation (Signed)
  Anesthesia Post-op Note  Patient: Randy [name]  Procedure(s) Performed: Procedure(s) (LRB): IRRIGATION AND DEBRIDEMENT EXTREMITY (Left)  Patient Location: PACU  Anesthesia Type: General  Level of Consciousness: awake and alert   Airway and Oxygen Therapy: Patient Spontanous Breathing  Post-op Pain: mild  Post-op Assessment: Post-op Vital signs reviewed, Patient's Cardiovascular Status Stable, Respiratory Function Stable, Patent Airway and No signs of Nausea or vomiting  Post-op Vital Signs: stable  Complications: No apparent anesthesia complications

## 2012-01-13 NOTE — Brief Op Note (Signed)
01/13/2012  6:42 PM  PATIENT:  Randy [name]  41 y.o. male  PRE-OPERATIVE DIAGNOSIS:  ABSCESS LEFT FOREARM  POST-OPERATIVE DIAGNOSIS:  ABSCESS LEFT FOREARM  PROCEDURE:  Procedure(s) (LRB) with comments: IRRIGATION AND DEBRIDEMENT EXTREMITY (Left)  SURGEON:  Surgeon(s) and Role:    * Kennieth Rad, MD - Primary  PHYSICIAN ASSISTANT:   ASSISTANTS: none   ANESTHESIA:   general  EBL:  Total I/O In: 900 [I.V.:900] Out: -   BLOOD ADMINISTERED:none  DRAINS: Penrose drain in the quarter inch in left forearm   LOCAL MEDICATIONS USED:  NONE  SPECIMEN:  No Specimen  DISPOSITION OF SPECIMEN:  N/A  COUNTS:  YES  TOURNIQUET:   Total Tourniquet Time Documented: Upper Arm (Left) - 755 minutes  DICTATION: .Other Dictation: Dictation Number report #[id number]  PLAN OF CARE: Admit to inpatient   PATIENT DISPOSITION:  PACU - hemodynamically stable.   Delay start of Pharmacological VTE agent (>24hrs) due to surgical blood loss or risk of bleeding: not applicable

## 2012-01-14 ENCOUNTER — Encounter (HOSPITAL_COMMUNITY): Payer: Self-pay | Admitting: Orthopedic Surgery

## 2012-01-14 LAB — CBC
Hemoglobin: 13.3 g/dL (ref 13.0–17.0)
MCH: 30.4 pg (ref 26.0–34.0)
RBC: 4.37 MIL/uL (ref 4.22–5.81)

## 2012-01-14 LAB — GLUCOSE, CAPILLARY: Glucose-Capillary: 113 mg/dL — ABNORMAL HIGH (ref 70–99)

## 2012-01-14 LAB — COMPREHENSIVE METABOLIC PANEL
AST: 37 U/L (ref 0–37)
Albumin: 2.7 g/dL — ABNORMAL LOW (ref 3.5–5.2)
CO2: 26 mEq/L (ref 19–32)
Calcium: 8.4 mg/dL (ref 8.4–10.5)
Creatinine, Ser: 0.96 mg/dL (ref 0.50–1.35)
GFR calc non Af Amer: >90 mL/min (ref >90)

## 2012-01-14 MED ORDER — NICOTINE 21 MG/24HR TD PT24
21.0000 mg | MEDICATED_PATCH | Freq: Every day | TRANSDERMAL | Status: DC
  Administered 2012-01-14 – 2012-01-15 (×2): 21 mg via TRANSDERMAL
  Filled 2012-01-14 (×2): qty 1

## 2012-01-14 MED ORDER — MORPHINE SULFATE 2 MG/ML IJ SOLN
2.0000 mg | INTRAMUSCULAR | Status: DC | PRN
  Administered 2012-01-14 – 2012-01-15 (×6): 2 mg via INTRAVENOUS
  Filled 2012-01-14 (×6): qty 1

## 2012-01-14 NOTE — Progress Notes (Signed)
Subjective: 1 Day Post-Op Procedure(s) (LRB): IRRIGATION AND DEBRIDEMENT EXTREMITY (Left) Patient reports pain as 5 on 0-10 scale.    Objective: Vital signs in last 24 hours: Temp:  [97.4 F (36.3 C)-98.9 F (37.2 C)] 98.9 F (37.2 C) (09/05 1325) Pulse Rate:  [74-92] 85  (09/05 1325) Resp:  [9-19] 18  (09/05 1325) BP: (99-132)/(65-92) 110/71 mmHg (09/05 1325) SpO2:  [99 %-100 %] 99 % (09/05 1325) Weight:  [65.772 kg (145 lb)] 65.772 kg (145 lb) (09/04 2249)  Intake/Output from previous day: 09/04 0701 - 09/05 0700 In: 2045 [I.V.:1945; IV Piggyback:100] Out: -  Intake/Output this shift: Total I/O In: 360 [P.O.:360] Out: -    Basename 01/14/12 0346 01/13/12 1031  HGB 13.3 13.9    Basename 01/14/12 0346 01/13/12 1031  WBC 8.5 11.1*  RBC 4.37 4.51  HCT 40.3 40.5  PLT 220 224    Basename 01/14/12 0346 01/13/12 1031  NA 135 133*  K 3.9 3.8  CL 101 98  CO2 26 24  BUN 12 13  CREATININE 0.96 0.97  GLUCOSE 117* 92  CALCIUM 8.4 9.4    Basename 01/13/12 1031  LABPT --  INR 1.09    Neurologically intact  Assessment/Plan: 1 Day Post-Op Procedure(s) (LRB): IRRIGATION AND DEBRIDEMENT EXTREMITY (Left) Advance diet  Teejay Meader F 01/14/2012, 5:24 PM

## 2012-01-14 NOTE — Progress Notes (Signed)
Patient ID: Randy [name], male   DOB: Apr 16, 1971, 41 y.o.   MRN: [id number] [facility] PROGRESS NOTE  Randy [name] WUJ:[id number] DOB: 1970/07/15 DOA: 01/13/2012 PCP: No primary provider on file.  Brief narrative: 41 year old male who was admitted for left forearm swelling, erythema and redness concerning for left arm abscess. Patient is now status post incision and drainage done 01/13/2012.  Assessment and Plan:  Principal Problem:  * Left forearm abscess  - status post I&D 01/13/2012 - while in patient continue current antibiotics; will transition to PO at the time of discharrge  - appreciate ortho following   Active Problems: Trans-gender  - on hormone replacement therapy  - continue current home therapies   Code Status: Full  Family Communication: Pt at bedside  Disposition Plan: perhaps in next 2 days   Manson Passey, MD  [facility] Pager [phone]  If 7PM-7AM, please contact night-coverage [email or web address] Password [email or web address] 01/14/2012, 2:55 PM   LOS: 1 day   HPI/Subjective: No acute events overnight. Reports pain at the left forearm but controlled with current analgesics.   Objective: Filed Vitals:   01/13/12 2102 01/13/12 2249 01/14/12 0704 01/14/12 1325  BP: 126/70 124/78 99/65 110/71  Pulse: 87 81 92 85  Temp: 97.6 F (36.4 C) 97.6 F (36.4 C) 97.7 F (36.5 C) 98.9 F (37.2 C)  TempSrc:  Oral Oral Oral  Resp: 16 16 16 18   Height:  5\' 6"  (1.676 m)    Weight:  65.772 kg (145 lb)    SpO2: 100% 100% 100% 99%    Intake/Output Summary (Last 24 hours) at 01/14/12 1455 Last data filed at 01/14/12 1326  Gross per 24 hour  Intake   2405 ml  Output      0 ml  Net   2405 ml    Exam:   General:  Pt is alert, follows commands appropriately, not in acute distress  Cardiovascular: Regular rate and rhythm, S1/S2, no murmurs, no rubs, no gallops  Respiratory: Clear to auscultation bilaterally, no wheezing, no crackles, no rhonchi  Abdomen: Soft,  non tender, non distended, bowel sounds present, no guarding  Extremities: No edema, pulses DP and PT palpable bilaterally; left forearm wrapped  Neuro: Grossly nonfocal  Data Reviewed: Basic Metabolic Panel:  Lab 01/14/12 8657 01/13/12 1031 01/11/12 0342 01/10/12 0230  NA 135 133* 132* 133*  K 3.9 3.8 3.7 3.6  CL 101 98 100 96  CO2 26 24 24 26   GLUCOSE 117* 92 96 85  BUN 12 13 10 9   CREATININE 0.96 0.97 1.09 1.05  CALCIUM 8.4 9.4 8.7 9.4  MG -- 1.9 -- --  PHOS -- 4.0 -- --   Liver Function Tests:  Lab 01/14/12 0346 01/13/12 1031 01/10/12 0230  AST 37 37 49*  ALT 55* 62* 113*  ALKPHOS 54 49 62  BILITOT 0.3 0.5 0.7  PROT 6.2 6.9 7.4  ALBUMIN 2.7* 3.1* 3.7   CBC:  Lab 01/14/12 0346 01/13/12 1031 01/11/12 0342 01/10/12 0230  WBC 8.5 11.1* 11.1* 11.0*  HGB 13.3 13.9 14.3 16.0  HCT 40.3 40.5 41.6 46.3  MCV 92.2 89.8 91.0 91.1  PLT 220 224 174 202   CBG:  Lab 01/14/12 0743  GLUCAP 113*    CULTURE, BLOOD (ROUTINE X 2)     Status: Normal (Preliminary result)   Collection Time   01/10/12  2:30 AM      Component Value Range Status Comment   Value:        BLOOD  CULTURE RECEIVED NO GROWTH TO DATE    Report Status PENDING   Incomplete   CULTURE, BLOOD (ROUTINE X 2)     Status: Normal (Preliminary result)   Collection Time   01/10/12  3:05 AM      Component Value Range Status Comment   Culture     Final    Value:        BLOOD CULTURE RECEIVED NO GROWTH TO DATE    Report Status PENDING   Incomplete   MRSA PCR SCREENING     Status: Normal   Collection Time   01/10/12 10:38 AM      Component Value Range Status Comment   MRSA by PCR NEGATIVE  NEGATIVE Final   WOUND CULTURE     Status: Normal (Preliminary result)   Collection Time   01/13/12  6:15 PM      Component Value Range Status Comment   Specimen Description ABSCESS FOREARM   Final    Special Requests NONE   Final    Gram Stain     Final    Value: ABUNDANT WBC PRESENT,BOTH PMN AND MONONUCLEAR     NO SQUAMOUS  EPITHELIAL CELLS SEEN     NO ORGANISMS SEEN   Culture NO GROWTH   Final    Report Status PENDING   Incomplete   ANAEROBIC CULTURE     Status: Normal (Preliminary result)   Collection Time   01/13/12  6:15 PM      Component Value Range Status Comment   Specimen Description ABSCESS FOREARM   Final    Special Requests A   Final    Gram Stain     Final    Value: ABUNDANT WBC PRESENT,BOTH PMN AND MONONUCLEAR     NO SQUAMOUS EPITHELIAL CELLS SEEN     NO ORGANISMS SEEN   Culture     Final    Value: NO ANAEROBES ISOLATED; CULTURE IN PROGRESS FOR 5 DAYS   Report Status PENDING   Incomplete      Studies: Dg Chest 2 View 01/13/2012  *  IMPRESSION: Mild hyperinflation.  No active disease.There is a nodular density in the right lower hemithorax laterally which may represent nipple shadow.  Repeat frontal view of the chest with nipple marker is recommended for confirmation.     Ct Forearm Left W Contrast 01/12/2012  * IMPRESSION: Large intramuscular abscess in the flexor pollicis longus muscle of the mid forearm.      Scheduled Meds:   . ARIPiprazole  5 mg Oral Daily  . buPROPion  300 mg Oral Daily  . chlorhexidine  60 mL Topical Once  . clonazePAM  0.75 mg Oral BID  . escitalopram  20 mg Oral Daily  . estradiol  2 mg Oral TID  . fentaNYL      . fentaNYL      . finasteride  5 mg Oral Daily  . imipenem-cilastatin  500 mg Intravenous Q8H  . nicotine  21 mg Transdermal Daily  . spironolactone  50 mg Oral Daily  . vancomycin  1,000 mg Intravenous Q8H   Continuous Infusions:   . lactated ringers 100 mL/hr at 01/13/12 1900  . DISCONTD: lactated ringers 125 mL/hr at 01/13/12 1610

## 2012-01-14 NOTE — Op Note (Signed)
NAMEDIAMOND, MARTUCCI                 ACCOUNT NO.:  [id number]  MEDICAL RECORD NO.:  [id number]  LOCATION:  1332                         FACILITY:  [facility]  PHYSICIAN:  Myrtie Neither, MD      DATE OF BIRTH:  [birth year]  DATE OF PROCEDURE:  01/13/2012 DATE OF DISCHARGE:                              OPERATIVE REPORT   PREOPERATIVE DIAGNOSIS:  Abscess, left forearm volar aspect.  POSTOPERATIVE DIAGNOSIS:  Abscess, left forearm, volar compartment.  PROCEDURE:  Incision and drainage, and excision and debridement; abscess, left forearm.  ANESTHESIA:  General.  DESCRIPTION OF PROCEDURE:  The patient was taken to the operating room. After given adequate preop medications, given general anesthesia and intubated, left forearm was prepped with DuraPrep and draped in usual sterile manner.  Tourniquet used for hemostasis.  Palpation of the abscess measures approximately 6 inches in width, the volar compartment of the forearm and 3 inches in width, firm erythematous area.  Incision was made through the skin, down to the fascia of the volar compartment of the forearm.  Once entering the fascia, abundant purulent material was evacuated.  Cultures for aerobic and anaerobic cultures were done. Some of the forearm muscle had showed evidence of necrosis, which was resected.  Further inspection distally showed no separate small pocket, which was also evacuated.  Copious irrigation with antibiotic solution was done and also with a Simpulse irrigator.  After adequate debridement and evacuation of the muscle itself, began to look pink and red and much held.  A 0.25-inch Penrose drain was placed into the wound. Subcutaneous tissue was approximated with 2-0 Vicryl, and skin approximated with skin staples.  Bulky compressive dressing was applied. The patient tolerated the procedure quite well, went to recovery room in stable and satisfactory condition.     Myrtie Neither, MD     AC/MEDQ  D:   01/13/2012  T:  01/14/2012  Job:  [id number]

## 2012-01-15 MED ORDER — DOXYCYCLINE HYCLATE 100 MG PO TABS: 100.0000 mg | ORAL_TABLET | Freq: Two times a day (BID) | ORAL | Status: AC

## 2012-01-15 MED ORDER — HYDROMORPHONE HCL 4 MG PO TABS: 4.0000 mg | ORAL_TABLET | ORAL | Status: AC | PRN

## 2012-01-15 NOTE — Progress Notes (Signed)
Patient ID: Randy [name], male   DOB: 1971/03/23, 41 y.o.   MRN: [id number] PATIENT IS STABLE FROM ORTHOPEDIC STAND POINT. RETURN TO OFFICE IN 10 DAYS, KEEP DRESSING DRY, USE SLING FOR ELEVATION,\.

## 2012-01-15 NOTE — Plan of Care (Signed)
Problem: Phase III Progression Outcomes Goal: Wound care performed by pt/family Outcome: Not Applicable Date Met:  01/15/12 Teaching done.

## 2012-01-15 NOTE — Progress Notes (Signed)
Patient discharged home. Pt in stable condition. No changes from assessment. Walked to car from floor. No complaints. Pt stated she was happy to be going home. Maralyn Sago, RN  11:30am 01/15/2012

## 2012-01-15 NOTE — Discharge Summary (Signed)
Physician Discharge Summary  Randy [name] ZOX:[id number] DOB: [birth year] DOA: 01/13/2012  PCP: No primary provider on file.  Admit date: 01/13/2012 Discharge date: 01/15/2012  Discharge Condition: medically stable to be discharged home today  Diet recommendation: regular   History of present illness:  41 year old male who was admitted for left forearm swelling, erythema and redness concerning for left arm abscess. Patient is now status post incision and drainage done 01/13/2012.    Discharge Diagnoses:  * Left forearm abscess  - status post I&D 01/13/2012  - will be discharged with doxycycline only, 10 days; does not need to be on levaquin.   Trans-gender  - on hormone replacement therapy  - continue current home therapies   Code Status: Full  Family Communication: Pt at bedside  Disposition Plan: discharge today; per ortho patient will follow in their office in 10 days after discharge  Discharge Exam: Filed Vitals:   01/15/12 0616  BP: 100/63  Pulse: 76  Temp: 98.1 F (36.7 C)  Resp: 18   Filed Vitals:   01/14/12 1325 01/14/12 2037 01/15/12 0500 01/15/12 0616  BP: 110/71 109/67  100/63  Pulse: 85 79  76  Temp: 98.9 F (37.2 C) 98.4 F (36.9 C)  98.1 F (36.7 C)  TempSrc: Oral Oral  Oral  Resp: 18 18  18   Height:      Weight:   68.675 kg (151 lb 6.4 oz)   SpO2: 99% 99%  100%    General: Pt is alert, follows commands appropriately, not in acute distress Cardiovascular: Regular rate and rhythm, S1/S2 +, no murmurs, no rubs, no gallops Respiratory: Clear to auscultation bilaterally, no wheezing, no crackles, no rhonchi Abdominal: Soft, non tender, non distended, bowel sounds +, no guarding Extremities: left arm wrapped; no LE edema, pulses palpable Neuro: Grossly nonfocal  Discharge Instructions  Discharge Orders    Future Orders Please Complete By Expires   Diet - low sodium heart healthy      Increase activity slowly      Call MD for:  persistant nausea and  vomiting      Call MD for:  redness, tenderness, or signs of infection (pain, swelling, redness, odor or green/yellow discharge around incision site)      Call MD for:  difficulty breathing, headache or visual disturbances      Call MD for:  severe uncontrolled pain      Call MD for:  persistant dizziness or light-headedness        Medication List  As of 01/15/2012 10:13 AM   STOP taking these medications         levofloxacin 750 MG tablet      oxyCODONE 5 MG immediate release tablet         TAKE these medications         ARIPiprazole 5 MG tablet   Commonly known as: ABILIFY   Take 5 mg by mouth daily. thoughts      buPROPion 150 MG 24 hr tablet   Commonly known as: WELLBUTRIN XL   Take 300 mg by mouth daily. For depression      clonazePAM 0.5 MG tablet   Commonly known as: KLONOPIN   Take 0.75 mg by mouth 2 (two) times daily. For anxiety      doxycycline 100 MG tablet   Commonly known as: VIBRA-TABS   Take 1 tablet (100 mg total) by mouth every 12 (twelve) hours.      escitalopram 20 MG tablet   Commonly  known as: LEXAPRO   Take 20 mg by mouth daily. For depression/anxiety      estradiol 2 MG tablet   Commonly known as: ESTRACE   Take 2 mg by mouth 3 (three) times daily. For hormone supplement      finasteride 5 MG tablet   Commonly known as: PROSCAR   Take 5 mg by mouth daily. For hair supplementation      HYDROmorphone 4 MG tablet   Commonly known as: DILAUDID   Take 1 tablet (4 mg total) by mouth every 4 (four) hours as needed for pain.      spironolactone 100 MG tablet   Commonly known as: ALDACTONE   Take 50 mg by mouth daily. For hormone supplement           Follow-up Information    Follow up with Kennieth Rad, MD. Schedule an appointment as soon as possible for a visit on 01/25/2012.   Contact information:   [facility] [address] [place] [address] [phone]           The results of significant  diagnostics from this hospitalization (including imaging, microbiology, ancillary and laboratory) are listed below for reference.    Significant Diagnostic Studies: Dg Chest 2 View 01/13/2012  * IMPRESSION: Mild hyperinflation.  No active disease.There is a nodular density in the right lower hemithorax laterally which may represent nipple shadow.  Repeat frontal view of the chest with nipple marker is recommended for confirmation.     Ct Forearm Left W Contrast 01/12/2012  * IMPRESSION: Large intramuscular abscess in the flexor pollicis longus muscle of the mid forearm.      Microbiology: CULTURE, BLOOD (ROUTINE X 2)     Status: Normal (Preliminary result)   Collection Time   01/10/12  2:30 AM      Component Value Range Status Comment   Culture     Final    Value:        BLOOD CULTURE RECEIVED NO GROWTH TO DATE    Report Status PENDING   Incomplete   CULTURE, BLOOD (ROUTINE X 2)     Status: Normal (Preliminary result)   Collection Time   01/10/12  3:05 AM      Component Value Range Status Comment   Culture     Final    Value:        BLOOD CULTURE RECEIVED NO GROWTH TO DATE   Report Status PENDING   Incomplete   MRSA PCR SCREENING     Status: Normal   Collection Time   01/10/12 10:38 AM      Component Value Range Status Comment   MRSA by PCR NEGATIVE  NEGATIVE Final   WOUND CULTURE     Status: Normal (Preliminary result)   01/13/12  6:15 PM      Component Value Range Status Comment   Specimen Description ABSCESS FOREARM   Final      NO ORGANISMS SEEN   Culture NO GROWTH   Final    Report Status PENDING   Incomplete   ANAEROBIC CULTURE     Status: Normal (Preliminary result)   Collection Time   01/13/12  6:15 PM      Component Value Range Status Comment   Specimen Description ABSCESS FOREARM   Final      NO ORGANISMS SEEN     Labs: Basic Metabolic Panel:  Lab 01/14/12 5284 01/13/12 1031 01/11/12 0342 01/10/12 0230  NA 135 133* 132* 133*  K 3.9 3.8 3.7 3.6  CL 101 98 100 96  CO2 26  24 24 26   GLUCOSE 117* 92 96 85  BUN 12 13 10 9   CREATININE 0.96 0.97 1.09 1.05  CALCIUM 8.4 9.4 8.7 9.4   Liver Function Tests:  Lab 01/14/12 0346 01/13/12 1031 01/10/12 0230  AST 37 37 49*  ALT 55* 62* 113*  ALKPHOS 54 49 62  BILITOT 0.3 0.5 0.7  PROT 6.2 6.9 7.4  ALBUMIN 2.7* 3.1* 3.7   CBC:  Lab 01/14/12 0346 01/13/12 1031 01/11/12 0342 01/10/12 0230  WBC 8.5 11.1* 11.1* 11.0*  HGB 13.3 13.9 14.3 16.0  HCT 40.3 40.5 41.6 46.3  MCV 92.2 89.8 91.0 91.1  PLT 220 224 174 202   CBG:  Lab 01/15/12 0744 01/14/12 0743  GLUCAP 95 113*    Time coordinating discharge: Over 30 minutes  Signed:  Manson Passey, MD  [facility] 01/15/2012, 10:13 AM  Pager #: [phone]

## 2012-01-16 LAB — CULTURE, BLOOD (ROUTINE X 2): Culture: NO GROWTH

## 2012-01-16 LAB — WOUND CULTURE: Culture: NO GROWTH

## 2012-01-18 LAB — ANAEROBIC CULTURE

## 2012-01-18 NOTE — Discharge Summary (Signed)
Physician Discharge Summary  Randy [name] WUJ:[id number] DOB: April 27, 1971 DOA: 01/09/2012  PCP: No primary provider on file.  Admit date: 01/09/2012 Discharge date: 01/12/2012  Recommendations for Outpatient Follow-up:  1. Patient called at home and instructed to return to the hospital immediately given CT scan findings of an intramuscular abscess.  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  Diet recommendation: Regular  History of present illness:  Randy [name] is an 41 y.o. 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  Hospital Course by problem:  Principal Problem:  *Cellulitis Of Left Forearm  Admitted for IV antibiotics: Vancomycin to cover gram + organisms including MRSA (negative for nasopharyngeal carriage) and Imipenem to cover gram - organisms. Will change to oral doxycycline and Levaquin today.  Blood cultures x 2 already sent. Negative to date. MRSA nasal swab negative.  HIV non-reactive.  Left forearm erythema regressing but still has an area of tense induration, so we checked CT of left forearm to rule out deep abscess which did, in fact, show an intramuscular abscess.  The patient left AMA after the CT scan without informing the nursing staff of his intent to leave.  His room was empty when I went to discuss the CT scan findings with him.  I was able to call him at home and told him he needed to return to the hospital immediately. Active Problems:  Recurrent major depression-severe /  Anxiety disorder  He was continued on his usual home medications: Abilify, Wellbutrin XL, Klonopin, Lexapro. Polysubstance abuse including IVDA (heroin), cocaine, marijuana  SW consulted for substance abuse counseling. Minimized use, and declined outpatient treatment.  HIV was checked and found to be non-reactive. Elevated LFTs / Hepatitis C  LFTs mildly elevated, felt to be related to history of hepatitis C. Hyponatremia  May be related to SSRI therapy or Spironolactone treatment. Mild.  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 Tobacco Abuse  Tobacco cessation counseling.  Nicotine patch ordered.  Procedures:  None.  Consultations:  None.  Discharge Exam: Filed Vitals:   01/12/12 1413  BP: 107/67  Pulse: 87  Temp: 97.9 F (36.6 C)  Resp: 18   Filed Vitals:   01/11/12 1357 01/11/12 2100 01/12/12 0600 01/12/12 1413  BP: 97/66 122/72 110/64 107/67  Pulse: 87 92 83 87  Temp: 98.2 F (36.8 C) 99.5 F (37.5 C) 98.9 F (37.2 C) 97.9 F (36.6 C)  TempSrc: Oral Oral Oral   Resp: 18 20 20 18   Height:      Weight:      SpO2: 100% 100% 99% 99%    Gen: NAD  Cardiovascular: RRR, No M/R/G  Respiratory: Lungs CTAB  Gastrointestinal: Abdomen soft, NT/ND with normal active bowel sounds.  Extremities: Left arm less erythematous, no pustules, abscesses or areas of fluctuance noted, but still has tenseness and significant erythema on the volar surface of his forearm.  Discharge Instructions  Discharge Orders    Future Orders Please Complete By Expires   Diet general  Increase activity slowly      Call MD for:  temperature >100.4      Call MD for:      Scheduling Instructions:   Worsening redness, pain or swelling of left arm, development of drainage.     Medication List  As of 01/18/2012  4:46 PM   TAKE these medications         ARIPiprazole 5 MG tablet   Commonly known as: ABILIFY    Take 5 mg by mouth daily. thoughts      buPROPion 150 MG 24 hr tablet   Commonly known as: WELLBUTRIN XL   Take 300 mg by mouth daily. For depression      clonazePAM 0.5 MG tablet   Commonly known as: KLONOPIN   Take 0.75 mg by mouth 2 (two) times daily. For anxiety      escitalopram 20 MG tablet   Commonly known as: LEXAPRO   Take 20 mg by mouth daily. For depression/anxiety      estradiol 2 MG tablet   Commonly known as: ESTRACE   Take 2 mg by mouth 3 (three) times daily. For hormone supplement      finasteride 5 MG tablet   Commonly known as: PROSCAR   Take 5 mg by mouth daily. For hair supplementation      spironolactone 100 MG tablet   Commonly known as: ALDACTONE   Take 50 mg by mouth daily. For hormone supplement           Follow-up Information    Follow up with  A PCP of your choice. (As needed)           The results of significant diagnostics from this hospitalization (including imaging, microbiology, ancillary and laboratory) are listed below for reference.    Significant Diagnostic Studies: Dg Chest 2 View  01/13/2012  *RADIOLOGY REPORT*  Clinical Data: Preop for left arm surgery  CHEST - 2 VIEW  Comparison: 11/17/2008  Findings: Cardiomediastinal silhouette is stable.  Mild hyperinflation again noted.  No acute infiltrate or pulmonary edema.  Bony thorax is stable. There is a nodular density in the right lower hemithorax laterally which may represent nipple shadow.  Repeat frontal view of the chest with nipple marker is recommended for confirmation.  IMPRESSION: Mild hyperinflation.  No active disease.There is a nodular density in the right lower hemithorax laterally which may represent nipple shadow.  Repeat frontal view of the chest with nipple marker is recommended for confirmation.   Original Report Authenticated By: Natasha Mead, M.D.    Ct Forearm Left W Contrast  01/12/2012  *RADIOLOGY REPORT*  Clinical Data: Cellulitis of the left forearm.  CT OF THE LEFT  FOREARM WITH CONTRAST  Technique:  Multidetector CT imaging was performed following the standard protocol during bolus administration of intravenous contrast.  Contrast: OMNIPAQUE IOHEXOL 300 MG/ML  SOLN  Comparison: None.  Findings: There is an intramuscular abscess in the flexor pollicis longus muscle of the right forearm.  The intramuscular abscess measures 8.3 x 2.6 x 2.0 cm. The abscess extends from 9 cm below the elbow joint to 6 cm above the wrist joint.  The patient has a circumferential edema in the subcutaneous fat of the forearm consistent with cellulitis.  No visible osteomyelitis.  IMPRESSION: Large intramuscular abscess in the flexor pollicis longus muscle of the mid forearm.   Original Report Authenticated By: Gwynn Burly, M.D.     Microbiology: Recent Results (from the past 240 hour(s))  CULTURE,  BLOOD (ROUTINE X 2)     Status: Normal   Collection Time   01/10/12  2:30 AM      Component Value Range Status Comment   Specimen Description BLOOD RIGHT ANTECUBITAL   Final    Special Requests     Final    Value: BOTTLES DRAWN AEROBIC AND ANAEROBIC 5CC AEROBIC/4CC ANAEROBIC   Culture  Setup Time 01/10/2012 14:57   Final    Culture NO GROWTH 5 DAYS   Final    Report Status 01/16/2012 FINAL   Final   CULTURE, BLOOD (ROUTINE X 2)     Status: Normal   Collection Time   01/10/12  3:05 AM      Component Value Range Status Comment   Specimen Description BLOOD R HAND   Final    Special Requests     Final    Value: BOTTLES DRAWN AEROBIC AND ANAEROBIC 5CC AEROBIC/3CC ANAEROBIC   Culture  Setup Time 01/10/2012 14:57   Final    Culture NO GROWTH 5 DAYS   Final    Report Status 01/16/2012 FINAL   Final   MRSA PCR SCREENING     Status: Normal   Collection Time   01/10/12 10:38 AM      Component Value Range Status Comment   MRSA by PCR NEGATIVE  NEGATIVE Final   WOUND CULTURE     Status: Normal   Collection Time   01/13/12  6:15 PM      Component Value Range Status Comment   Specimen  Description ABSCESS FOREARM   Final    Special Requests NONE   Final    Gram Stain     Final    Value: ABUNDANT WBC PRESENT,BOTH PMN AND MONONUCLEAR     NO SQUAMOUS EPITHELIAL CELLS SEEN     NO ORGANISMS SEEN   Culture NO GROWTH 2 DAYS   Final    Report Status 01/16/2012 FINAL   Final   ANAEROBIC CULTURE     Status: Normal   Collection Time   01/13/12  6:15 PM      Component Value Range Status Comment   Specimen Description ABSCESS FOREARM   Final    Special Requests A   Final    Gram Stain     Final    Value: ABUNDANT WBC PRESENT,BOTH PMN AND MONONUCLEAR     NO SQUAMOUS EPITHELIAL CELLS SEEN     NO ORGANISMS SEEN   Culture NO ANAEROBES ISOLATED   Final    Report Status 01/18/2012 FINAL   Final      Labs: Basic Metabolic Panel:  Lab 01/14/12 1610 01/13/12 1031  NA 135 133*  K 3.9 3.8  CL 101 98  CO2 26 24  GLUCOSE 117* 92  BUN 12 13  CREATININE 0.96 0.97  CALCIUM 8.4 9.4  MG -- 1.9  PHOS -- 4.0   Liver Function Tests:  Lab 01/14/12 0346 01/13/12 1031  AST 37 37  ALT 55* 62*  ALKPHOS 54 49  BILITOT 0.3 0.5  PROT 6.2 6.9  ALBUMIN 2.7* 3.1*   CBC:  Lab 01/14/12 0346 01/13/12 1031  WBC 8.5 11.1*  NEUTROABS -- 7.7  HGB 13.3 13.9  HCT 40.3 40.5  MCV 92.2 89.8  PLT 220 224   CBG:  Lab 01/15/12 0744 01/14/12 0743  GLUCAP 95 113*    Time coordinating discharge: Left AMA.  D/C note took 20 minutes.  Signed:  Tashema Tiller  Pager [phone] [facility] 01/18/2012, 4:46 PM

## 2012-01-23 ENCOUNTER — Emergency Department (HOSPITAL_COMMUNITY)
Admission: EM | Admit: 2012-01-23 | Discharge: 2012-01-24 | Disposition: A | Discharge status: Home / Self Care | Payer: Medicare Other | Attending: Emergency Medicine | Admitting: Emergency Medicine

## 2012-01-23 ENCOUNTER — Encounter (HOSPITAL_COMMUNITY): Payer: Self-pay | Admitting: Emergency Medicine

## 2012-01-23 DIAGNOSIS — Z9889 Other specified postprocedural states: Secondary | ICD-10-CM | POA: Insufficient documentation

## 2012-01-23 DIAGNOSIS — F17200 Nicotine dependence, unspecified, uncomplicated: Secondary | ICD-10-CM | POA: Insufficient documentation

## 2012-01-23 DIAGNOSIS — B1920 Unspecified viral hepatitis C without hepatic coma: Secondary | ICD-10-CM | POA: Insufficient documentation

## 2012-01-23 DIAGNOSIS — M79609 Pain in unspecified limb: Secondary | ICD-10-CM | POA: Diagnosis not present

## 2012-01-23 DIAGNOSIS — L7682 Other postprocedural complications of skin and subcutaneous tissue: Secondary | ICD-10-CM

## 2012-01-23 DIAGNOSIS — G8918 Other acute postprocedural pain: Secondary | ICD-10-CM | POA: Diagnosis not present

## 2012-01-23 NOTE — ED Notes (Signed)
Pt c/o increasing L arm pain. Pt currently has ace wrap to L arm, recently tx for cellulitis. Pt denies any further drug use, pt now c/o shooting pain to fingers.

## 2012-01-24 DIAGNOSIS — F17200 Nicotine dependence, unspecified, uncomplicated: Secondary | ICD-10-CM | POA: Diagnosis not present

## 2012-01-24 DIAGNOSIS — M79609 Pain in unspecified limb: Secondary | ICD-10-CM | POA: Diagnosis not present

## 2012-01-24 DIAGNOSIS — Z9889 Other specified postprocedural states: Secondary | ICD-10-CM | POA: Diagnosis not present

## 2012-01-24 DIAGNOSIS — B1920 Unspecified viral hepatitis C without hepatic coma: Secondary | ICD-10-CM | POA: Diagnosis not present

## 2012-01-24 MED ORDER — NAPROXEN 500 MG PO TABS: 500.0000 mg | ORAL_TABLET | Freq: Two times a day (BID) | ORAL | Status: DC

## 2012-01-24 MED ORDER — OXYCODONE-ACETAMINOPHEN 5-325 MG PO TABS
1.0000 | ORAL_TABLET | Freq: Once | ORAL | Status: AC
  Administered 2012-01-24: 1 via ORAL
  Filled 2012-01-24: qty 1

## 2012-01-24 NOTE — ED Provider Notes (Signed)
History     CSN: [id number]  Arrival date & time 01/23/12  2226   First MD Initiated Contact with Patient 01/24/12 0155      Chief Complaint  Patient presents with  . Arm Pain    (Consider location/radiation/quality/duration/timing/severity/associated sxs/prior treatment) The history is provided by the patient and medical records.   the patient had operative debridement of his left anterior forearm 10 days ago for cellulitis and abscess to his left anterior forearm.  He reports she's had some increasing pain to his left arm and thus presents for evaluation today.  He's had his Ace bandage on since discharge from the hospital has not changed his bandages since then.  He is supposed to have followup with his orthopedic surgeon next week.  He status staples in place in his left forearm.  He denies fevers or chills.  His had no spreading redness.  He denies drainage from his surgical incision.  He reports his erythema significantly improved.  He was discharged home on doxycycline and reports he has finished his course of antibiotics.  His pain is mild to moderate at this time.  Is not worsened by anything.  Past Medical History  Diagnosis Date  . Depression   . Hepatitis C   . 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 Cellulitis  Of  Left Forearm 01/10/2012    From IVDA    Past Surgical History  Procedure Date  . I&d extremity 01/13/2012    Procedure: IRRIGATION AND DEBRIDEMENT EXTREMITY;  Surgeon: Kennieth Rad, MD;  Location: [facility];  Service: Orthopedics;  Laterality: Left;    Family History  Problem Relation Age of Onset  . Idiopathic pulmonary fibrosis Father     History  Substance Use Topics  . Smoking status: Current Every Day Smoker -- 0.5 packs/day for 20 years  . Smokeless tobacco: Never Used  . Alcohol Use: 25.8 oz/week    40 Cans of beer, 3 Glasses of wine per week     1-2 drinks 1-2 times a week.      Review of Systems  All other systems reviewed  and are negative.    Allergies  Penicillins  Home Medications   Current Outpatient Rx  Name Route Sig Dispense Refill  . ARIPIPRAZOLE 5 MG PO TABS Oral Take 5 mg by mouth at bedtime. thoughts    . BUPROPION HCL ER (XL) 150 MG PO TB24 Oral Take 300 mg by mouth daily. For depression    . CLONAZEPAM 0.5 MG PO TABS Oral Take 0.75 mg by mouth 2 (two) times daily. For anxiety    . DOXYCYCLINE HYCLATE 100 MG PO TABS Oral Take 1 tablet (100 mg total) by mouth every 12 (twelve) hours. 20 tablet 0  . ESCITALOPRAM OXALATE 20 MG PO TABS Oral Take 20 mg by mouth daily. For depression/anxiety    . ESTRADIOL 2 MG PO TABS Oral Take 2 mg by mouth 3 (three) times daily. For hormone supplement    . FINASTERIDE 5 MG PO TABS Oral Take 5 mg by mouth daily. For hair supplementation    . HYDROMORPHONE HCL 4 MG PO TABS Oral Take 1 tablet (4 mg total) by mouth every 4 (four) hours as needed for pain. 45 tablet 0  . SPIRONOLACTONE 100 MG PO TABS Oral Take 50 mg by mouth daily. For hormone supplement      BP 100/58  Pulse 73  Temp 97.9 F (36.6 C) (Oral)  Resp 18  SpO2 100%  Physical Exam  Constitutional: He is oriented to person, place, and time. He appears well-developed and well-nourished.  HENT:  Head: Normocephalic.  Eyes: EOM are normal.  Neck: Normal range of motion.  Pulmonary/Chest: Effort normal.  Abdominal: He exhibits no distension.  Musculoskeletal: Normal range of motion.       Left anterior forearm with vertical incision on the anterior aspect of his left forearm.  Staples are in place.  There is no surrounding erythema or drainage.  There is no signs of wound dehiscence.  His compartments are soft his left forearm.  His left radial pulse is normal.  Has normal strength and sensation in his left hand.  Neurological: He is alert and oriented to person, place, and time.  Psychiatric: He has a normal mood and affect.    ED Course  Procedures (including critical care time)  Labs Reviewed  - No data to display No results found.   No diagnosis found.    MDM  The patient was discharged in the hospital with a prescription for Dilaudid.  He reports he no longer has this.  He's given Percocet for pain here.  I think this is more pain related.  I see no signs of infection at this time.  Recommended call his orthopedic surgeon for followup on Monday.  At this time he does not need imaging or lab work.  I do not believe him to have a cellulitis or recurrence of his deep abscess.  Discharge home in good condition.  The patient understands return the emergency apartment for new or worsening symptoms.  Neurovascularly he is intact.        Lyanne Co, MD 01/24/12 [phone]

## 2012-02-23 DIAGNOSIS — E288 Other ovarian dysfunction: Secondary | ICD-10-CM | POA: Diagnosis not present

## 2012-04-09 ENCOUNTER — Encounter (HOSPITAL_COMMUNITY): Payer: Self-pay | Admitting: *Deleted

## 2012-04-09 ENCOUNTER — Inpatient Hospital Stay (HOSPITAL_COMMUNITY)
Admission: RE | Admit: 2012-04-09 | Discharge: 2012-04-14 | DRG: 885 | Disposition: A | Discharge status: Other Acute Inpatient Hospital | Payer: Medicare Other | Attending: Emergency Medicine | Admitting: Emergency Medicine

## 2012-04-09 DIAGNOSIS — F1420 Cocaine dependence, uncomplicated: Secondary | ICD-10-CM

## 2012-04-09 DIAGNOSIS — R45851 Suicidal ideations: Secondary | ICD-10-CM | POA: Diagnosis not present

## 2012-04-09 DIAGNOSIS — B1920 Unspecified viral hepatitis C without hepatic coma: Secondary | ICD-10-CM | POA: Diagnosis present

## 2012-04-09 DIAGNOSIS — Z79899 Other long term (current) drug therapy: Secondary | ICD-10-CM

## 2012-04-09 DIAGNOSIS — F411 Generalized anxiety disorder: Secondary | ICD-10-CM | POA: Diagnosis present

## 2012-04-09 DIAGNOSIS — IMO0001 Reserved for inherently not codable concepts without codable children: Secondary | ICD-10-CM

## 2012-04-09 DIAGNOSIS — E349 Endocrine disorder, unspecified: Secondary | ICD-10-CM | POA: Diagnosis present

## 2012-04-09 DIAGNOSIS — F332 Major depressive disorder, recurrent severe without psychotic features: Principal | ICD-10-CM | POA: Diagnosis present

## 2012-04-09 DIAGNOSIS — F419 Anxiety disorder, unspecified: Secondary | ICD-10-CM | POA: Diagnosis present

## 2012-04-09 DIAGNOSIS — F17200 Nicotine dependence, unspecified, uncomplicated: Secondary | ICD-10-CM | POA: Diagnosis present

## 2012-04-09 DIAGNOSIS — F339 Major depressive disorder, recurrent, unspecified: Secondary | ICD-10-CM

## 2012-04-09 DIAGNOSIS — Z8619 Personal history of other infectious and parasitic diseases: Secondary | ICD-10-CM

## 2012-04-09 DIAGNOSIS — Z872 Personal history of diseases of the skin and subcutaneous tissue: Secondary | ICD-10-CM

## 2012-04-09 DIAGNOSIS — F19939 Other psychoactive substance use, unspecified with withdrawal, unspecified: Secondary | ICD-10-CM | POA: Diagnosis present

## 2012-04-09 DIAGNOSIS — F1410 Cocaine abuse, uncomplicated: Secondary | ICD-10-CM

## 2012-04-09 LAB — COMPREHENSIVE METABOLIC PANEL
ALT: 86 U/L — ABNORMAL HIGH (ref 0–53)
AST: 41 U/L — ABNORMAL HIGH (ref 0–37)
CO2: 28 mEq/L (ref 19–32)
Calcium: 9.8 mg/dL (ref 8.4–10.5)
Chloride: 98 mEq/L (ref 96–112)
Creatinine, Ser: 1.13 mg/dL (ref 0.50–1.35)
GFR calc Af Amer: >90 mL/min (ref >90)
GFR calc non Af Amer: 80 mL/min — ABNORMAL LOW (ref >90)
Glucose, Bld: 80 mg/dL (ref 70–99)
Sodium: 133 mEq/L — ABNORMAL LOW (ref 135–145)
Total Bilirubin: 0.3 mg/dL (ref 0.3–1.2)

## 2012-04-09 LAB — RAPID URINE DRUG SCREEN, HOSP PERFORMED
Amphetamines: NOT DETECTED
Tetrahydrocannabinol: NOT DETECTED

## 2012-04-09 LAB — CBC
HCT: 45.8 % (ref 39.0–52.0)
Hemoglobin: 15.1 g/dL (ref 13.0–17.0)
MCHC: 33 g/dL (ref 30.0–36.0)
MCV: 92 fL (ref 78.0–100.0)
RDW: 13.6 % (ref 11.5–15.5)

## 2012-04-09 LAB — ACETAMINOPHEN LEVEL: Acetaminophen (Tylenol), Serum: <15 ug/mL (ref 10–30)

## 2012-04-09 MED ORDER — ESTRADIOL 1 MG PO TABS
2.0000 mg | ORAL_TABLET | Freq: Three times a day (TID) | ORAL | Status: DC
  Administered 2012-04-10 – 2012-04-14 (×14): 2 mg via ORAL
  Filled 2012-04-09 (×18): qty 1

## 2012-04-09 MED ORDER — ACETAMINOPHEN 325 MG PO TABS
650.0000 mg | ORAL_TABLET | Freq: Four times a day (QID) | ORAL | Status: DC | PRN
Start: 2012-04-09 — End: 2012-04-14

## 2012-04-09 MED ORDER — ESCITALOPRAM OXALATE 20 MG PO TABS
20.0000 mg | ORAL_TABLET | Freq: Every day | ORAL | Status: DC
  Administered 2012-04-09 – 2012-04-14 (×6): 20 mg via ORAL
  Filled 2012-04-09 (×8): qty 1

## 2012-04-09 MED ORDER — ALUM & MAG HYDROXIDE-SIMETH 200-200-20 MG/5ML PO SUSP: 30.0000 mL | ORAL | Status: DC | PRN

## 2012-04-09 MED ORDER — SPIRONOLACTONE 100 MG PO TABS
50.0000 mg | ORAL_TABLET | Freq: Every day | ORAL | Status: DC
  Administered 2012-04-10 – 2012-04-14 (×5): 50 mg via ORAL
  Filled 2012-04-09 (×7): qty 1

## 2012-04-09 MED ORDER — ARIPIPRAZOLE 5 MG PO TABS
5.0000 mg | ORAL_TABLET | Freq: Every day | ORAL | Status: DC
  Administered 2012-04-09 – 2012-04-13 (×5): 5 mg via ORAL
  Filled 2012-04-09 (×9): qty 1

## 2012-04-09 MED ORDER — MAGNESIUM HYDROXIDE 400 MG/5ML PO SUSP: 30.0000 mL | Freq: Every day | ORAL | Status: DC | PRN

## 2012-04-09 NOTE — ED Notes (Signed)
[id number]> Expected date:<BR> Expected time:<BR> Means of arrival:<BR> Comments:<BR> [facility] pt
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
## 2012-04-09 NOTE — Progress Notes (Signed)
D   Pt is a 41 year old transgender patient admitted with depression with suicidal ideation   She contracts for safety on the unit   She reports increased depression and was unable to go to her mental health appointment so she was stretching her medications out and were not taking them as prescribed   She is homeless and recently seperated from her boyfriend who is now in jail  Pt reports feeling very depressed and suicidal but does not have a plan   She is positive for hep c with no other medical problems noted except for a recent surgery of her arm where she had cellulitis  A   Admission and orientation completed  Nourishment offered  Fall precautions sheet compleeted   Verbal support given   Medications as ordered and monitored for effectiveness  R   Pt safe at present and adjusting well

## 2012-04-09 NOTE — Clinical Social Work Note (Signed)
[facility] Group Notes: (Clinical Social Work)   04/09/2012   5:00 PM    Type of Therapy:  Group Therapy   Participation Level:  Did Not Attend    Ambrose Mantle, LCSW 04/09/2012, 5:00 PM

## 2012-04-09 NOTE — ED Provider Notes (Signed)
History     CSN: [id number]  Arrival date & time 04/09/12  1327   First MD Initiated Contact with Patient 04/09/12 1342      Chief Complaint  Patient presents with  . Medical Clearance     The history is provided by the patient.   patient reports increasing suicidal thoughts over the past several days.  No specific plan at this time.  Also admits to cocaine abuse.  The patient was sent to the emergency department from the behavioral health hospital for medical clearance.  The patient is without any medical complaints.  No chest pain or shortness of breath.  No abdominal pain.  No nausea vomiting or diarrhea.  Past Medical History  Diagnosis Date  . Depression   . Hepatitis C   . 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 Cellulitis  Of  Left Forearm 01/10/2012    From IVDA    Past Surgical History  Procedure Date  . I&d extremity 01/13/2012    Procedure: IRRIGATION AND DEBRIDEMENT EXTREMITY;  Surgeon: Kennieth Rad, MD;  Location: [facility];  Service: Orthopedics;  Laterality: Left;    Family History  Problem Relation Age of Onset  . Idiopathic pulmonary fibrosis Father     History  Substance Use Topics  . Smoking status: Current Every Day Smoker -- 1.5 packs/day for 20 years    Types: Cigarettes  . Smokeless tobacco: Never Used  . Alcohol Use: 25.8 oz/week    3 Glasses of wine, 40 Cans of beer per week     Comment: 1-2 drinks 1-2 times a week.      Review of Systems  All other systems reviewed and are negative.    Allergies  Penicillins  Home Medications   Current Outpatient Rx  Name  Route  Sig  Dispense  Refill  . ARIPIPRAZOLE 5 MG PO TABS   Oral   Take 5 mg by mouth at bedtime. thoughts         . BUPROPION HCL ER (XL) 150 MG PO TB24   Oral   Take 300 mg by mouth daily. For depression         . CLONAZEPAM 0.5 MG PO TABS   Oral   Take 0.75 mg by mouth 2 (two) times daily. For anxiety         . ESCITALOPRAM OXALATE 20 MG PO TABS   Oral   Take 20 mg by mouth daily. For depression/anxiety         . ESTRADIOL 2 MG PO TABS   Oral   Take 2 mg by mouth 3 (three) times daily. For hormone supplement         . FINASTERIDE 5 MG PO TABS   Oral   Take 5 mg by mouth daily. For hair supplementation         . SPIRONOLACTONE 100 MG PO TABS   Oral   Take 50 mg by mouth daily. For hormone supplement           BP 115/73  Pulse 78  Temp 97.7 F (36.5 C) (Oral)  Resp 16  SpO2 100%  Physical Exam  Nursing note and vitals reviewed. Constitutional: He is oriented to person, place, and time. He appears well-developed and well-nourished.  HENT:  Head: Normocephalic and atraumatic.  Eyes: EOM are normal.  Neck: Normal range of motion.  Cardiovascular: Normal rate, regular rhythm, normal heart sounds and intact distal pulses.   Pulmonary/Chest: Effort normal and breath  sounds normal. No respiratory distress.  Abdominal: Soft. He exhibits no distension. There is no tenderness.  Musculoskeletal: Normal range of motion.  Neurological: He is alert and oriented to person, place, and time.  Skin: Skin is warm and dry.  Psychiatric:       Flat affect.  Suicidal thoughts    ED Course  Procedures (including critical care time)   Labs Reviewed  ACETAMINOPHEN LEVEL  CBC  COMPREHENSIVE METABOLIC PANEL  ETHANOL  SALICYLATE LEVEL  URINE RAPID DRUG SCREEN (HOSP PERFORMED)   No results found.   No diagnosis found.    MDM  Sent to emergency department for medical clearance for suicidal thoughts.  Patient without any medical complaints at this time.  Vital signs normal.        Lyanne Co, MD 04/09/12 1350

## 2012-04-09 NOTE — Tx Team (Signed)
Initial Interdisciplinary Treatment Plan  PATIENT STRENGTHS: (choose at least two) Ability for insight Capable of independent living General fund of knowledge  PATIENT STRESSORS: Financial difficulties Marital or family conflict Medication change or noncompliance Occupational concerns   PROBLEM LIST: Problem List/Patient Goals Date to be addressed Date deferred Reason deferred Estimated date of resolution  Depression                                                       DISCHARGE CRITERIA:  Ability to meet basic life and health needs Adequate post-discharge living arrangements Motivation to continue treatment in a less acute level of care Reduction of life-threatening or endangering symptoms to within safe limits Verbal commitment to aftercare and medication compliance  PRELIMINARY DISCHARGE PLAN: Attend aftercare/continuing care group Outpatient therapy Placement in alternative living arrangements  PATIENT/FAMIILY INVOLVEMENT: This treatment plan has been presented to and reviewed with the patient, Randy [name], and/or family member, .  The patient and family have been given the opportunity to ask questions and make suggestions.  [name] 04/09/2012, 5:36 PM

## 2012-04-09 NOTE — ED Notes (Signed)
Pt went to [facility] for voluntary admission for SI without plan and detox from cocaine. Pt has been accepted at [facility] but must be medically cleared first.

## 2012-04-09 NOTE — ED Notes (Signed)
Pt presenting to ed with c/o "I'm depressed and this has been going on for years and yes I do want to hurt myself but I don't have a plan at this time. Pt sent from behavioral health. Pt states positive SI pt denies HI at this time

## 2012-04-10 ENCOUNTER — Encounter (HOSPITAL_COMMUNITY): Payer: Self-pay | Admitting: Psychiatry

## 2012-04-10 DIAGNOSIS — F339 Major depressive disorder, recurrent, unspecified: Secondary | ICD-10-CM

## 2012-04-10 DIAGNOSIS — F411 Generalized anxiety disorder: Secondary | ICD-10-CM

## 2012-04-10 DIAGNOSIS — F1420 Cocaine dependence, uncomplicated: Secondary | ICD-10-CM | POA: Diagnosis present

## 2012-04-10 DIAGNOSIS — F1410 Cocaine abuse, uncomplicated: Secondary | ICD-10-CM

## 2012-04-10 MED ORDER — BUPROPION HCL ER (XL) 150 MG PO TB24
150.0000 mg | ORAL_TABLET | Freq: Every day | ORAL | Status: DC
  Administered 2012-04-10 – 2012-04-14 (×5): 150 mg via ORAL
  Filled 2012-04-10 (×8): qty 1

## 2012-04-10 MED ORDER — TRAZODONE HCL 50 MG PO TABS
50.0000 mg | ORAL_TABLET | Freq: Every evening | ORAL | Status: DC | PRN
  Filled 2012-04-10: qty 14

## 2012-04-10 NOTE — Progress Notes (Signed)
D: Pt slept throughout shift, waking only to take scheduled medication when delivered by RN @2255 .  Gross assessment performed at that time.  Pt has no complaints.  Appears anxious- facial expression, affect, and mood- during 4-5 minute interview.  Speech logical and coherent; thought process and content unremarkable.  Cooperative with assessment and medication administration.  Denies SI/HI, AVH, and acute pain.  Contracting for safety.  A: Pt assessed to ensure comfort and safety.  Medication administered according to med orders and initial POC.  Q15 minute safety checks maintained as per unit protocol.  R: No immediate needs identified by Pt.  Safety maintained. Dion Saucier RN

## 2012-04-10 NOTE — Progress Notes (Signed)
Psychoeducational Group Note  Date:  04/10/2012 Time:  2000  Group Topic/Focus:  Wrap-Up Group:   The focus of this group is to help patients review their daily goal of treatment and discuss progress on daily workbooks.  Participation Level: Did Not Attend  Participation Quality:  Not Applicable  Affect:  Not Applicable  Cognitive:  Not Applicable  Insight:  Not Applicable  Engagement in Group: Not Applicable  Additional Comments:  The patient was asleep during group.   Hazle Coca S 04/10/2012, 12:43 AM
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
## 2012-04-10 NOTE — Progress Notes (Signed)
D) Pt has come out of her room for groups and meals, otherwise will go to her room and sleep. Pt is disheveled. Will come when she is called for med's, but has little eye contact. Does however express gratitude with always saying thank you. Other than this, Pt does not initiate conversation. Rates her depression and hopelessness both at an 8 and reports that she is having thoughts of SI on and off.  A) Verbal contract made with Pt for her safety. Given support, reassurance and appropriate praise. Encouraged Pt to take a bath, but did not force the issue. Given praise for coming to the hospital. R) Pt admits to thoughts of SI on and off. Withdraws and will come out of her room when necessary.

## 2012-04-10 NOTE — Progress Notes (Signed)
Psychoeducational Group Note  Date:  04/10/2012 Time:  1315 Group Topic/Focus:  Conflict Resolution:   The focus of this group is to discuss the conflict resolution process and how it may be used upon discharge.  Participation Level:  Did Not Attend  Participation Quality:    Affect:    Cognitive:    Insight:    Engagement in Group:    Additional Comments:  Pt was sleeping, refused to attend.  Isla Pence M 04/10/2012, 2:08 PM

## 2012-04-10 NOTE — Clinical Social Work Note (Signed)
[facility] Group Notes:  (Clinical Social Work)  04/10/2012   3:00-4:00PM  Summary of Progress/Problems:   The main focus of today's process group was for the patient to define "support" and describe what healthy supports are, then to identify the patient's current support system and decide on other supports that can be put in place to prevent future hospitalizations.  An emphasis was placed on using therapist, doctor and problem-specific support groups to expand supports. The patient expressed that she has fewer supports than she used to have.  Her speech was very delayed.  She does want to have a doctor and therapist as supports.  She is currently trying to switch psychiatrists from Ellis Savage to Dr. Milagros Evener, and has been in therapy so long she is just getting to the point of being willing to try again.  Type of Therapy:  Process Group  Participation Level:  Minimal  Participation Quality:  Attentive  Affect:  Depressed and Flat  Cognitive:  Oriented  Insight:  Good  Engagement in Group:  Limited  Engagement in Therapy:  Good  Modes of Intervention:  Clarification, Education, Limit-setting, Problem-solving, Socialization, Support and Processing   Ambrose Mantle, LCSW 04/10/2012,

## 2012-04-10 NOTE — Progress Notes (Signed)
Psychoeducational Group Note  Date:  04/10/2012 Time:  1015  Group Topic/Focus:  Making Healthy Choices:   The focus of this group is to help patients identify negative/unhealthy choices they were using prior to admission and identify positive/healthier coping strategies to replace them upon discharge.  Participation Level:  Active  Participation Quality:  Appropriate  Affect:  Appropriate  Cognitive:  Appropriate  Insight:  Good  Engagement in Group:  Good  Additional Comments:    [name] A 04/10/2012  
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
## 2012-04-11 DIAGNOSIS — F332 Major depressive disorder, recurrent severe without psychotic features: Principal | ICD-10-CM

## 2012-04-11 NOTE — Social Work (Signed)
Interdisciplinary Treatment Plan Update (Adult)  Date:  04/11/2012  Time Reviewed:  7:06 AM    Progress in Treatment: Attending groups:   No   Participating in groups:  No Taking medication as prescribed:  Yes Tolerating medication:  Yes Family/Significant othe contact made: Pending Patient understands diagnosis:  Yes Discussing patient identified problems/goals with staff: Yes Medical problems stabilized or resolved: No Denies suicidal/homicidal ideation: No -  Patient able to contract for safety Issues/concerns per patient self-inventory:  Other:  New problem(s) identified:  Reason for Continuation of Hospitalization: Anxiety Depression Medication stabilization Suicidal ideation  Interventions implemented related to continuation of hospitalization:  Medication mgement; safety checks q 15 mins; coping skills development  Additional comments: Patient remained suicidal and rates herself and 8 for depression and hopelessness. Patient faces impending eviction from current home due to being unable to pay rent. Patient agrees the death of a former boyfriend who recently died and patient suspects it was a suicide. Current boyfriend is incarcerated for kidnapping.  Estimated length of stay: Friday, December 6  Discharge Plan:  Outpatient follow up to be scheduled  New goal(s):  Review of initial/current patient goals per problem list:    1.  Goal(s): Eliminate SI/other thoughts of self harm   Met:  No  Target date: d/c  As evidenced by: Patient will no longer endorse SI/HI or other thoughts of self harm.    2.  Goal (s):Reduce depression/anxiety  Met no  Target date: d/c  As evidenced by: Patient will rate symptoms at four or below    3.  Goal(s):.stabilize on meds   Met:  No  Target date: d/c  As evidenced by: Patient will report being stabilized on medications - less symptomatic    4.  Goal(s): Refer for outpatient follow up   Met:  No  Target  date: d/c  As evidenced by: Follow up appointment will be scheduled    Attendees: Patient:   @TD  7:06 AM  Physican:  Patrick North, MD @TD  7:06 AM  Nursing:  Neill Loft, RN  04/11/2012 7:06 AM  Nursing:    @TD  7:06 AM  Clinical Social Worker:  Patton Salles, LCSW @TD  7:06 AM  Other: Joanell Rising 04/11/2012 7:06 AM   Other:         04/11/2012 7:06 AM Other:

## 2012-04-11 NOTE — Progress Notes (Signed)
Nutrition Brief Note  Patient identified on the Malnutrition Screening Tool (MST) Report due to report of unsure re: wt loss.  Body mass index is 23.49 kg/(m^2). Pt meets criteria for wnl based on current BMI.   Current diet order is Regular. Labs and medications reviewed.   Pt sleeping at time of visit.  Awakens to voice, but declines visit from RD stating "can this wait, I'm tired."  Pt with variable wt hx, however weighed 150 lbs in January 2013.  No nutrition interventions warranted at this time. If nutrition issues arise, please consult RD.   Loyce Dys, MS RD LDN Clinical Inpatient Dietitian Pager: [phone] Weekend/After hours pager: [phone]

## 2012-04-11 NOTE — Progress Notes (Signed)
D: Pt isolative and withdrawn, stays in bedroom except for meals and medications. However, Pt did attend wrapup group tonight- stated "ok" day and that she took a shower (said this was "monumental").  Expresses no complaints, affect and mood continue to be anxious and depressed with poor eye contact.  Speech is logical and coherent; thought process difficult to assess due to no initiation of interaction on Pt's part and minimal interaction when approached.  SI reported to be "frequent but fleeting"; denies HI, AVH, and acute pain.  Contracting for safety on unit.   A: Pt casually approached in dayroom after group for check-in, but tolerated 1:1 for less than 5 minutes.  Encouraged to approach RN with any needs or questions; praised for attending group and performing ADLs.  No PRNs given.  Medication administered according to med orders and POC.  Q15 minute safety checks maintained as per unit protocol.  R: Pt polite, cooperative, reclusive.  Slightly more engaged than yesterday.  Safety maintained. Dion Saucier RN

## 2012-04-11 NOTE — Progress Notes (Signed)
[facility] Group Notes:  (Counselor/Nursing/MHT/Case Management/Adjunct)  04/11/2012 1:57 AM  Type of Therapy:  Psychoeducational Skills  Participation Level:  Active  Participation Quality:  Attentive  Affect:  Appropriate  Cognitive:  Appropriate  Insight:  Good  Engagement in Group:  Good  Engagement in Therapy:  Good  Modes of Intervention:  Education  Summary of Progress/Problems: The patient was minimally con versive in group this evening. She stated that the highlight of her day was that she bathed. She attended groups and would not elaborate any further about her day. Her goal for tomorrow is to locate some phone numbers so that she can schedule some medical appointments.    Christophe Rising S 04/11/2012, 1:57 AM

## 2012-04-11 NOTE — Progress Notes (Signed)
D:  Patient's self inventory sheet, patient sleeps well, improving appetite, low energy level, poor attention span.  Rated depression and hopelessness #8.  Anxiety #10.  Has felt agitated in past 24 hours.  SI off/on, contracts for safety.  Denied physical problems.  Zero pain goal.  Zero pain.  Not sure how to better care of self after discharge.  No questions for staff.   Does not have discharge plans.  No questions for staff.  No discharge plans.  No problems taking meds after discharge. A:  Medications administered per MD order.  Support and encouragement given throughout day.  Support and safety checks completed as ordered. R:  Following treatment plan.  SI off/on, contracts for safety.  Denied HI.  Denied A/V hallucinations.   Patient remains safe and receptive on unit.  Patient took nap before lunch.

## 2012-04-11 NOTE — Progress Notes (Signed)
Patient ID: Randy [name], male   DOB: 13-Jan-1971, 41 y.o.   MRN: [id number] PER STATE REGULATIONS 482.30  THIS CHART WAS REVIEWED FOR MEDICAL NECESSITY WITH RESPECT TO THE PATIENT'S ADMISSION/ DURATION OF STAY.  11/30-12/06/2011   NEXT REVIEW DATE: 04/12/2012 Willa Rough, RN, BSN CASE MANAGER

## 2012-04-11 NOTE — Progress Notes (Signed)
[facility] Adult Inpatient Family/Significant Other Suicide Prevention Education  Suicide Prevention Education:  Patient Refusal for Family/Significant Other Suicide Prevention Education: The patient Randy [name] has refused to provide written consent for family/significant other to be provided Family/Significant Other Suicide Prevention Education during admission and/or prior to discharge.  Physician notified.  Patton Salles 04/11/2012, 4:58 PM

## 2012-04-11 NOTE — Progress Notes (Signed)
Psychoeducational Group Note  Date:  04/11/2012 Time:  1100  Group Topic/Focus:  Self Care:   The focus of this group is to help patients understand the importance of self-care in order to improve or restore emotional, physical, spiritual, interpersonal, and financial health.  Participation Level:  Active  Participation Quality:  Appropriate and Attentive  Affect:  Appropriate  Cognitive:  Alert, Appropriate and Oriented  Insight:    Engagement in Group:  Good  Additional Comments:  Pt. Was attentive during group and completed activity. Did not share.   Ruta [facility]s [facility] 04/11/2012, 2:24 PM

## 2012-04-11 NOTE — Progress Notes (Signed)
[facility] MD Progress Note  04/11/2012 11:36 AM Randy [name]  MRN:  [id number]  S: Patient seen today. Reports being depressed, continues to have suicidal thoughts, denies a plan. Continues to have withdrawal symptoms from the Cocaine.  Diagnosis:   Axis I: Major Depression, Recurrent severe, Cocaine Abuse Axis II: No diagnosis Axis III:  Past Medical History  Diagnosis Date  . Depression   . Hepatitis C   . 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 Cellulitis  Of  Left Forearm 01/10/2012    From IVDA   Axis IV: other psychosocial or environmental problems Axis V: 51-60 moderate symptoms  ADL's:  Intact  Sleep: Fair  Appetite:  Fair  Suicidal Ideation:  Mental Status Examination/Evaluation: Objective:  Appearance: Disheveled  Eye Contact::  Minimal  Speech:  Slow  Volume:  Decreased  Mood:  Depressed and Hopeless  Affect:  Blunt and Flat  Thought Process:  Coherent  Orientation:  Full  Thought Content:  WDL  Suicidal Thoughts:  Yes, denies plan  Homicidal Thoughts:  No  Memory:  Immediate;   Fair Recent;   Fair Remote;   Fair  Judgement:  Fair  Insight:  Fair  Psychomotor Activity:  Normal  Concentration:  Fair  Recall:  Fair  Akathisia:  No  Handed:  Right  AIMS (if indicated):     Assets:  Communication Skills  Sleep:  Number of Hours: 6    Vital Signs:Blood pressure 129/76, pulse 94, temperature 97.8 F (36.6 C), temperature source Oral, resp. rate 16, height 5\' 7"  (1.702 m), weight 68.04 kg (150 lb), SpO2 100.00%. Current Medications: Current Facility-Administered Medications  Medication Dose Route Frequency Provider Last Rate Last Dose  . acetaminophen (TYLENOL) tablet 650 mg  650 mg Oral Q6H PRN Shuvon Rankin, NP      . alum & mag hydroxide-simeth (MAALOX/MYLANTA) 200-200-20 MG/5ML suspension 30 mL  30 mL Oral Q4H PRN Shuvon Rankin, NP      . ARIPiprazole (ABILIFY) tablet 5 mg  5 mg Oral QHS Shuvon Rankin, NP   5 mg at 04/10/12 2319  . buPROPion  (WELLBUTRIN XL) 24 hr tablet 150 mg  150 mg Oral Daily [name] Fee, MD   150 mg at 04/11/12 0835  . escitalopram (LEXAPRO) tablet 20 mg  20 mg Oral Daily Shuvon Rankin, NP   20 mg at 04/11/12 0835  . estradiol (ESTRACE) tablet 2 mg  2 mg Oral TID Shuvon Rankin, NP   2 mg at 04/11/12 0836  . magnesium hydroxide (MILK OF MAGNESIA) suspension 30 mL  30 mL Oral Daily PRN Shuvon Rankin, NP      . spironolactone (ALDACTONE) tablet 50 mg  50 mg Oral Daily Shuvon Rankin, NP   50 mg at 04/11/12 0836  . traZODone (DESYREL) tablet 50 mg  50 mg Oral QHS PRN [name] Fee, MD        Lab Results:  Results for orders placed during the hospital encounter of 04/09/12 (from the past 48 hour(s))  URINE RAPID DRUG SCREEN (HOSP PERFORMED)     Status: Abnormal   Collection Time   04/09/12  1:49 PM      Component Value Range Comment   Opiates NONE DETECTED  NONE DETECTED    Cocaine POSITIVE (*) NONE DETECTED    Benzodiazepines NONE DETECTED  NONE DETECTED    Amphetamines NONE DETECTED  NONE DETECTED    Tetrahydrocannabinol NONE DETECTED  NONE DETECTED    Barbiturates NONE DETECTED  NONE DETECTED  ACETAMINOPHEN LEVEL     Status: Normal   Collection Time   04/09/12  1:59 PM      Component Value Range Comment   Acetaminophen (Tylenol), Serum <15.0  10 - 30 ug/mL   CBC     Status: Normal   Collection Time   04/09/12  1:59 PM      Component Value Range Comment   WBC 7.6  4.0 - 10.5 K/uL    RBC 4.98  4.22 - 5.81 MIL/uL    Hemoglobin 15.1  13.0 - 17.0 g/dL    HCT 09.8  11.9 - 14.7 %    MCV 92.0  78.0 - 100.0 fL    MCH 30.3  26.0 - 34.0 pg    MCHC 33.0  30.0 - 36.0 g/dL    RDW 82.9  56.2 - 13.0 %    Platelets 208  150 - 400 K/uL   COMPREHENSIVE METABOLIC PANEL     Status: Abnormal   Collection Time   04/09/12  1:59 PM      Component Value Range Comment   Sodium 133 (*) 135 - 145 mEq/L    Potassium 4.4  3.5 - 5.1 mEq/L    Chloride 98  96 - 112 mEq/L    CO2 28  19 - 32 mEq/L    Glucose, Bld 80  70 -  99 mg/dL    BUN 15  6 - 23 mg/dL    Creatinine, Ser 8.65  0.50 - 1.35 mg/dL    Calcium 9.8  8.4 - 78.4 mg/dL    Total Protein 7.4  6.0 - 8.3 g/dL    Albumin 3.7  3.5 - 5.2 g/dL    AST 41 (*) 0 - 37 U/L    ALT 86 (*) 0 - 53 U/L    Alkaline Phosphatase 75  39 - 117 U/L    Total Bilirubin 0.3  0.3 - 1.2 mg/dL    GFR calc non Af Amer 80 (*) >90 mL/min    GFR calc Af Amer >90  >90 mL/min   ETHANOL     Status: Normal   Collection Time   04/09/12  1:59 PM      Component Value Range Comment   Alcohol, Ethyl (B) <11  0 - 11 mg/dL   SALICYLATE LEVEL     Status: Abnormal   Collection Time   04/09/12  1:59 PM      Component Value Range Comment   Salicylate Lvl <2.0 (*) 2.8 - 20.0 mg/dL     Physical Findings: AIMS: Facial and Oral Movements Muscles of Facial Expression: None, normal Lips and Perioral Area: None, normal Jaw: None, normal Tongue: None, normal,Extremity Movements Upper (arms, wrists, hands, fingers): None, normal Lower (legs, knees, ankles, toes): None, normal, Trunk Movements Neck, shoulders, hips: None, normal, Overall Severity Severity of abnormal movements (highest score from questions above): None, normal Incapacitation due to abnormal movements: None, normal Patient's awareness of abnormal movements (rate only patient's report): No Awareness, Dental Status Current problems with teeth and/or dentures?: No Does patient usually wear dentures?: No  CIWA:    COWS:     Treatment Plan Summary: Daily contact with patient to assess and evaluate symptoms and progress in treatment Medication management  Plan: Continue current plan of care.  Randy [name] 04/11/2012, 11:36 AM

## 2012-04-11 NOTE — Social Work (Signed)
Aftercare Planning Group: 04/11/2012 9:45 AM Patient did not attend after care planning group  [facility] Group Note : Clinical Social Worker Group Therapy  04/11/2012  1:15 PM  Type of Therapy:  Group Therapy - Process Group  Participation Level:  Minimal  Participation Quality:  Minimal  Affect:  Blunted, Depressed and Flat  Cognitive:  Alert  Insight:  Limited  Engagement in Group:  Limited  Engagement in Therapy:  Limited  Modes of Intervention:  Clarification, Education, Socialization and Support  Summary of Progress/Problems: Patient reports her biggest obstacle is being unable to identify what she wants in life. Patient stated she is gone to school, has a degree in Art and design, and is not doing anything with it. Patient admitted that she is fearful of trying to do what she wants to do experiencing multiple past failures and feeling that she will fail again. Patient agreed to talk with her professors to see what options she has and was encouraged to talk to others in the profession that  she enjoys to help her determine what it would take for her to be successful in the career she wants to engage in .  Patton Salles, LCSW 04/11/2012 3:00 pm

## 2012-04-12 DIAGNOSIS — F1420 Cocaine dependence, uncomplicated: Secondary | ICD-10-CM

## 2012-04-12 MED ORDER — ENSURE COMPLETE PO LIQD: 237.0000 mL | Freq: Two times a day (BID) | ORAL | Status: DC

## 2012-04-12 MED ORDER — NICOTINE 21 MG/24HR TD PT24
21.0000 mg | MEDICATED_PATCH | Freq: Every day | TRANSDERMAL | Status: DC
  Administered 2012-04-12 – 2012-04-14 (×3): 21 mg via TRANSDERMAL
  Filled 2012-04-12 (×4): qty 1

## 2012-04-12 NOTE — Progress Notes (Signed)
Psychoeducational Group Note  Date:  04/12/2012 Time:  1100a  Group Topic/Focus:  Recovery Goals:   The focus of this group is to identify appropriate goals for recovery and establish a plan to achieve them.  Participation Level:  Active  Participation Quality:  Appropriate  Affect:  Appropriate  Cognitive:  Appropriate  Insight:  Engaged  Engagement in Group:  Improving  Additional Comments:  Pt. More willing to share and participate in group today. Pt. Outlined his recovery and goals for achieving recovery.   Ruta Hinds [place] 04/12/2012, 1:20 PM

## 2012-04-12 NOTE — Progress Notes (Signed)
D: Pt spent more time in open areas than on previous days, some interaction with peers in dayroom.  Spent most of shift sleeping but got up for 1:1 and to take HS medications.  Expressed no complaints.  Affect and mood remain depressed with facial expression blank and eye contact brief.  Speech logical and coherent with volume at more normal level than previously.  No evidence of disorganized thought process or content.  Denies SI/HI, AVH, and acute pain on evening shift.  Contracting for safety on unit.  A: Engaged Pt in prolonged 1:1 for first time.  Interaction with this RN was assertive but guarded, with Pt reporting that she is "feeling better".  Smiled several times in response to praise and encouragement and evidenced quiet sense of humor.  No PRNs given.  Medication given according to med orders and POC.  Q15 minute safety checks maintained as per unit protocol.  R: Pt continues to look blank/disengaged when future plans (post-discharge) are brought up.  Otherwise cooperative and pleasant.  Safety maintained. Dion Saucier RN
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
## 2012-04-12 NOTE — Progress Notes (Signed)
Grief and Loss Group  All participants were attentive and participated in sharing personal experiences of loss. Most of conversation involved the loss of significant family members who had been sources of support and stability. Discussion of feelings and reactions and how to care for self during grief as well as recognizing behaviors and reactions which tend to avoid healthy grief.  Group discussed normal grief reactions as well.    [name] E Chaplain 

## 2012-04-12 NOTE — Progress Notes (Addendum)
D:  Patient's self inventory sheet, patient sleeps well, has good appetite, low energy level, improving attentoin span.  Rated depression, hopelessness #8, anxiety #7.  Denied withdrawals.  Denied SI.  Denied physical problems.  Zero pain, zero pain goal.  No questions for staff.   Does not know what will happen after discharge, no where to stay, no money to buy medications. A:  Medications administered per MD order.  Support and encouragement given throughout day.  Support and safety checks completed as ordered. R:  Following treatment plan.  Denied SI and HI.   Denied A/V hallucinations.  Contracts for safety.  Patient remains safe and receptive on unit. Patient attended and participated in groups today.

## 2012-04-12 NOTE — Progress Notes (Signed)
Patient ID: Randy [name], male   DOB: 05-05-1971, 41 y.o.   MRN: [id number]   PER STATE REGULATIONS 482.30  THIS CHART WAS REVIEWED FOR MEDICAL NECESSITY WITH RESPECT TO THE PATIENT'S ADMISSION/ DURATION OF STAY.  NEXT REVIEW DATE: 04/15/2012  Willa Rough, RN, BSN CASE MANAGER

## 2012-04-12 NOTE — Progress Notes (Signed)
Patient ID: Randy [name], male   DOB: 07/20/1970, 41 y.o.   MRN: [id number] Pt visible in the milieu.  Pt with minimal but appropriate interaction with staff and peers.  Pt attending groups.  Pt presenting with depressed affect.  Needs assessed.  Pt denied.  Fifteen minute checks in progress. Pt safe on unit.
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
## 2012-04-13 NOTE — Progress Notes (Signed)
[facility] Group Notes:  (Counselor/Nursing/MHT/Case Management/Adjunct)  04/13/2012 3:04 PM  Type of Therapy:  Psychoeducational Skills  Participation Level:  Minimal  Participation Quality:  Appropriate and Attentive  Affect:  Blunted and Depressed  Cognitive:  Appropriate and Oriented  Insight:  Good\  Engagement in Group:  Engaged  Engagement in Therapy:  n/a  Modes of Intervention:  Activity, Discussion, Education, Problem-solving, Socialization and Support  Summary of Progress/Problems: [name] attended psychoeducation group that focused on using quality time with support systems/individuals to engage in healthy coping skills. [name] participated in an activity guessing about self and peers. [name] was quiet but attentive while group discussed who their support systems are, how they can spend positive quality time with them as a coping skills and a way to strengthen their relationship. [name] was given a homework assignment to find two ways to improve her support systems and twenty activities she can do to spend quality time with her supports.    [name] 04/13/2012, 3:04 PM

## 2012-04-13 NOTE — Progress Notes (Signed)
Reviewed. Agree with plan and recommendations.

## 2012-04-13 NOTE — Progress Notes (Signed)
Patient ID: Randy [name], male   DOB: 1971/02/28, 41 y.o.   MRN: [id number] [facility] MD Progress Note  04/12/2012 5:16 PM Caeson Filippi  MRN:  [id number]  S: "Mood has been in dumps for a long time. I feel like my mood is good as it is gonna get.  Not being completely happy motivates me to keep making changes." Rates Hopelessness at 5/10; Depression-4/10; Anxiety-4/10  Diagnosis:    Axis I: Major Depression, Recurrent severe, Cocaine Abuse Axis II: No diagnosis Axis III:  Past Medical History  Diagnosis Date  . Depression   . Hepatitis C   . 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 Cellulitis  Of  Left Forearm 01/10/2012    From IVDA   Axis IV: other psychosocial or environmental problems Axis V: 51-60 moderate symptoms  ADL's: Poor. Disheveled. (Hair greasy).  Sleep: Good- "Slept well, feels rested."  Appetite:  Good, "eating well"  Review of Systems :  HEENT: Positive for congestion (+nasal stuffiness; +sneezing).   Psychiatric/Behavioral: Positive for depression (+hopelessness).  Negative for hallucinations. The patient is nervous/anxious.  All other systems negative.  Suicidal Ideation:  Mental Status Examination/Evaluation: Objective:  Appearance: Disheveled  Eye Contact:: Fair  Speech:  Slow  Volume:  soft  Mood:  Depressed   Affect:  Blunted  Thought Process:  Constricted  Orientation:  Full  Thought Content:  WDL; Denies Hallucination,  Suicidal: Fleeting thoughts  Homicidal Thoughts:  No  Memory:  Immediate;   Fair Recent;   Fair Remote;   Fair  Judgement:  Fair  Insight:  Fair  Psychomotor Activity:  Normal  Concentration:  Fair  Recall:  Fair  Akathisia:  No  Handed:  Right  AIMS (if indicated):     Assets:  Communication Skills  Sleep:  Number of Hours: 6.25     Vital Signs:Blood pressure 110/73, pulse 70, temperature 99.3 F (37.4 C), temperature source Oral, resp. rate 16, height 5\' 7"  (1.702 m), weight 68.04 kg (150 lb), SpO2  100.00%.   Current Medications: Current Facility-Administered Medications  Medication Dose Route Frequency Provider Last Rate Last Dose  . acetaminophen (TYLENOL) tablet 650 mg  650 mg Oral Q6H PRN Shuvon Rankin, NP      . alum & mag hydroxide-simeth (MAALOX/MYLANTA) 200-200-20 MG/5ML suspension 30 mL  30 mL Oral Q4H PRN Shuvon Rankin, NP      . ARIPiprazole (ABILIFY) tablet 5 mg  5 mg Oral QHS Shuvon Rankin, NP   5 mg at 04/11/12 2117  . buPROPion (WELLBUTRIN XL) 24 hr tablet 150 mg  150 mg Oral Daily [name] Fee, MD   150 mg at 04/12/12 0752  . escitalopram (LEXAPRO) tablet 20 mg  20 mg Oral Daily Shuvon Rankin, NP   20 mg at 04/12/12 0752  . estradiol (ESTRACE) tablet 2 mg  2 mg Oral TID Shuvon Rankin, NP   2 mg at 04/12/12 1146  . magnesium hydroxide (MILK OF MAGNESIA) suspension 30 mL  30 mL Oral Daily PRN Shuvon Rankin, NP      . nicotine (NICODERM CQ - dosed in mg/24 hours) patch 21 mg  21 mg Transdermal Daily Himabindu Ravi, MD   21 mg at 04/12/12 1107  . spironolactone (ALDACTONE) tablet 50 mg  50 mg Oral Daily Shuvon Rankin, NP   50 mg at 04/12/12 0753  . traZODone (DESYREL) tablet 50 mg  50 mg Oral QHS PRN [name] Fee, MD        Lab Results:  No results found for this or any previous visit (from the past 48 hour(s)).  Physical Findings: AIMS: Facial and Oral Movements Muscles of Facial Expression: None, normal Lips and Perioral Area: None, normal Jaw: None, normal Tongue: None, normal,Extremity Movements Upper (arms, wrists, hands, fingers): None, normal Lower (legs, knees, ankles, toes): None, normal,  Trunk Movements Neck, shoulders, hips: None, normal,  Overall Severity Severity of abnormal movements (highest score from questions above): None, normal Incapacitation due to abnormal movements: None, normal Patient's awareness of abnormal movements (rate only patient's report): No Awareness,  Dental Status Current problems with teeth and/or dentures?: No Does  patient usually wear dentures?: No  CIWA:  CIWA-Ar Total: 0  COWS:  COWS Total Score: 0   Treatment Plan Summary: Daily contact with patient to assess and evaluate symptoms and progress in treatment Medication management  Plan: Continue current plan of care. Discharge planning- Possible discharge 04/14/12  Norval Gable FNP-BC 04/12/2012, 5:16 PM

## 2012-04-13 NOTE — Progress Notes (Signed)
D:  Glennon denies SI/HI/AVH at this time.  She attended evening group and is interacting appropriately with staff and other patients.   A:  Medication given as ordered.  Emotional support provided.  Safety checks q 15 minutes. R:  Safety maintained on unit.

## 2012-04-13 NOTE — Social Work (Signed)
  [facility] Group Notes:  (Counselor/Nursing/MHT/Case Management/Adjunct)  04/13/2012  8:30 AM  Type of Therapy:  Discharge Planning  Participation Level: Minimal  Participation Quality:  Attentive, Drowsy and Sharing  Affect:  Blunted and Lethargic  Cognitive:  Appropriate  Insight:  Limited  Engagement in Group:  Limited  Engagement in Therapy:  Limited  Modes of Intervention:  Discussion, Education, Exploration and Support  Summary of Progress/Problems: Pt attended discharge planning group and actively participated in group.  CSW provided pt with today's workbook.  Patient ranks herself a 5 for depression and anxiety and therefore for hopelessness and helplessness but reports being at her baseline. Patient contacted her outpatient providers yesterday and stated she has worked out a Insurance claims handler. Patient plans to live in a motel until she finds more permanent housing.   [facility] Group Notes:  (Counselor/Nursing/MHT/Case Management/Adjunct)  04/13/2012  1:15 PM  Type of Therapy:  Group Therapy  Participation Level:  Minimal  Participation Quality:  Attentive  Affect:  Blunted  Cognitive:  Appropriate  Insight:  Improving  Engagement in Group:  Limited  Engagement in Therapy:  Limited  Modes of Intervention:  Discussion, Education, Exploration and Support  Summary of Progress/Problems: Patient participated minimally in group focused on emotional regulation. Patient was attentive stating she is going to try to let the of the past and move on reporting that after 3 hospitalizations this year she hope she is in a better place to cope with her life.  Patton Salles LCSW 04/13/2012 6:57 AM

## 2012-04-13 NOTE — Progress Notes (Signed)
D: Patient denies SI/HI and auditory and visual hallucinations. The patient has a depressed mood and affect. The patient reports sleeping fairly well and states that her appetite and energy level are improving.The patient is attending groups and participating appropriately within the milieu.   A: Patient given emotional support from RN. Patient encouraged to come to staff with concerns and/or questions. Patient's medication routine continued. Patient's orders and plan of care reviewed.  R: Patient remains appropriate and cooperative. Will continue to monitor patient q15 minutes for safety.

## 2012-04-13 NOTE — Social Work (Signed)
Interdisciplinary Treatment Plan Update (Adult)  Date:  04/13/2012  Time Reviewed:  6:58 AM    Progress in Treatment: Attending groups:   Yes   Participating in groups:  Yes Taking medication as prescribed:  Yes Tolerating medication:  Yes Family/Significant othe contact made: Patient refused Patient understands diagnosis:  Yes Discussing patient identified problems/goals with staff: Yes Medical problems stabilized or resolved: Yes Denies suicidal/homicidal ideation: Yes Issues/concerns per patient self-inventory:  Other:  New problem(s) identified:  Reason for Continuation of Hospitalization: Anxiety Depression Medication stabilization   Interventions implemented related to continuation of hospitalization:  Medication mgement; safety checks q 15 mins; coping skills development  Additional comments: Patient reports that she has been sleepy but is doing okay. Patient says her plans are to live in a motel and to followup with her outpatient providers. Patient believes she can be ready to leave by tomorrow. Patient has been no-show to several outpatient appointments and says she is working out a Insurance claims handler with her current providers.  Estimated length of stay: 1-2 days  Discharge Plan:  Outpatient follow up to be scheduled  New goal(s):  Review of initial/current patient goals per problem list:    1.  Goal(s): Eliminate SI/other thoughts of self harm   Met:  Yes  Target date: d/c  As evidenced by: Patient will no longer endorse SI/HI or other thoughts of self harm.    2.  Goal (s):Reduce depression/anxiety  Met: Yes  Target date: d/c  As evidenced by: Patient will rate symptoms at four or below    3.  Goal(s):.stabilize on meds   Met:  No  Target date: d/c  As evidenced by: Patient will report being stabilized on medications - less symptomatic    4.  Goal(s): Refer for outpatient follow up   Met:  No  Target date: d/c  As evidenced by:  Follow up appointment will be scheduled    Attendees: Patient:   @TD  6:58 AM  Physican:  Patrick North, MD @TD  6:58 AM  Nursing:  Berneice Heinrich, RN  04/13/2012 6:58 AM  Nursing:    @TD  6:58 AM  Clinical Social Worker:  Patton Salles, LCSW @TD  6:58 AM  Other: Joanell Rising 04/13/2012 6:58 AM   Other: Buford Dresser        04/13/2012 6:58 AM Other:

## 2012-04-14 LAB — BASIC METABOLIC PANEL
Chloride: 104 mEq/L (ref 96–112)
GFR calc Af Amer: >90 mL/min (ref >90)
GFR calc non Af Amer: 85 mL/min — ABNORMAL LOW (ref >90)
Potassium: 4.2 mEq/L (ref 3.5–5.1)
Sodium: 137 mEq/L (ref 135–145)

## 2012-04-14 MED ORDER — TRAZODONE HCL 50 MG PO TABS: 50.0000 mg | ORAL_TABLET | Freq: Every evening | ORAL | Status: DC | PRN

## 2012-04-14 MED ORDER — ARIPIPRAZOLE 5 MG PO TABS: 5.0000 mg | ORAL_TABLET | Freq: Every day | ORAL | Status: DC

## 2012-04-14 MED ORDER — ESCITALOPRAM OXALATE 20 MG PO TABS: 20.0000 mg | ORAL_TABLET | Freq: Every day | ORAL | Status: DC

## 2012-04-14 MED ORDER — BUPROPION HCL ER (XL) 150 MG PO TB24: 150.0000 mg | ORAL_TABLET | Freq: Every day | ORAL | Status: DC

## 2012-04-14 MED ORDER — ESTRADIOL 2 MG PO TABS: 2.0000 mg | ORAL_TABLET | Freq: Three times a day (TID) | ORAL | Status: DC

## 2012-04-14 MED ORDER — SPIRONOLACTONE 100 MG PO TABS: 50.0000 mg | ORAL_TABLET | Freq: Every day | ORAL | Status: DC

## 2012-04-14 NOTE — Progress Notes (Signed)
[facility] Adult Case Management Discharge Plan :  Will you be returning to the same living situation after discharge: Yes,    At discharge, do you have transportation home?:Yes,    Do you have the ability to pay for your medications:Yes,     Release of information consent forms completed and in the chart;  Patient's signature needed at discharge.  Patient to Follow up at: Follow-up Information    [facility] with [facility] (Office requires patient to call and make an appointment do to 3 prior no-shows.)    Contact information:   [address] [place], [place] [address] [phone]         Patient denies SI/HI:   Yes,       Safety Planning and Suicide Prevention discussed:  Cecelia Byars 04/14/2012, 12:25 PM

## 2012-04-14 NOTE — Discharge Summary (Signed)
Greater than 30 minutes.  Physician Discharge Summary Note  Patient:  Randy [name] is an 41 y.o., male MRN:  [id number] DOB:  [birth year] Patient phone:  [phone] (home)  Patient address:   [address] [place] [address],   Date of Admission:  04/09/2012  Date of Discharge: 04/14/2012  Reason for Admission:  Worsening depression with suicidal thoughts.  Discharge Diagnoses: Active Problems:  Anxiety disorder  Major depression, recurrent  Cocaine dependence  Review of Systems  Constitutional: Negative.   HENT: Negative.   Eyes: Negative.   Respiratory: Negative.   Cardiovascular: Negative.   Gastrointestinal: Negative.   Genitourinary: Negative.   Musculoskeletal: Negative.   Skin: Negative.   Neurological: Negative.   Endo/Heme/Allergies: Negative.   Psychiatric/Behavioral: Positive for depression (However, received treatment and psychotherapy, nowstable for discharge). Negative for suicidal ideas, hallucinations, memory loss and substance abuse. The patient is nervous/anxious (However, received treatment and psychotherapy, now stable for discharger). The patient does not have insomnia.    Axis Diagnosis:   AXIS I:  Major depression, recurrent, Cocaine dependence, Anxiety disorder AXIS II:  Deferred AXIS III:   Past Medical History  Diagnosis Date  . Depression   . Hepatitis C   . 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 Cellulitis  Of  Left Forearm 01/10/2012    From IVDA   AXIS IV:  other psychosocial or environmental problems AXIS V:  62  Level of Care:  OP  Hospital Course:  41 Y/O trans gender male in one of several admissions to [facility] who comes requesting help as she has experienced worsening of the depression with thoughts of wanting to kill herself. She has had a hard time dealing with anniversary of death of her father, the holidays being evicted, and having to face charges for possession and paraphernalia. She has been using crack  cocaine everyday for the last month.  While a patient in this hospital, Ms. Linquist received medication management for her major depressive disorder symptoms. She was prescribed and received Aripiprrazole (Abilify) 5 mg for mood control, Lexapro 20 mg for depression, Wellbutrin XL 150 mg for depression and Trazodone 50 mg for sleep. She also was enrolled in group counseling sessions and activities to learn coping skills that will help her cope better with her symptoms after discharge. She also received medication management and monitoring for her other medical issues and concerns. She tolerated her treatment regimen without any significant adverse effects and or reactions presented.   Patient did respond positively to her treatment regimen. This is evidenced by her daily reports of improved mood, reduction of symptoms and presentation of good affect. She attended treatment team meeting this am and met with her treatment team members. Her reason for admission, symptoms, response to treatment and discharge plans discussed with patient. Ms. Boettner endorsed that her symptoms has stabilized and that she is ready for discharge. It was agreed upon that patient will continue psychiatric care on outpatient basis. She will follow-up care at the [facility]. She is instructed and explained that she will have to call to the clinic and make this appointment due to her missing 3 scheduled appointments at this clinic respectively. She instructed and and encouraged to call and make this follow-up appointment, and make every effort to keep this appointment in a timely manner.  Upon discharge, Ms. Pittsley adamantly denies any suicidal, homicidal ideations, auditory, visual hallucinations and or delusional thinking. She was provided with 14 days worth supply samples of his  [facility] discharge medications. She left [facility] with all personal belongings via personal arranged transport in no apparent distress.  Consults:   None  Significant Diagnostic Studies:  labs: CBC with diff, CMP, Toxicology tests, UDS  Discharge Vitals:   Blood pressure 110/77, pulse 77, temperature 98.2 F (36.8 C), temperature source Oral, resp. rate 16, height 5\' 7"  (1.702 m), weight 68.04 kg (150 lb), SpO2 100.00%. Body mass index is 23.49 kg/(m^2). Lab Results:   Results for orders placed during the hospital encounter of 04/09/12 (from the past 72 hour(s))  BASIC METABOLIC PANEL     Status: Abnormal   Collection Time   04/14/12  6:15 AM      Component Value Range Comment   Sodium 137  135 - 145 mEq/L    Potassium 4.2  3.5 - 5.1 mEq/L    Chloride 104  96 - 112 mEq/L    CO2 25  19 - 32 mEq/L    Glucose, Bld 99  70 - 99 mg/dL    BUN 12  6 - 23 mg/dL    Creatinine, Ser 1.61  0.50 - 1.35 mg/dL    Calcium 9.0  8.4 - 09.6 mg/dL    GFR calc non Af Amer 85 (*) >90 mL/min    GFR calc Af Amer >90  >90 mL/min     Physical Findings: AIMS: Facial and Oral Movements Muscles of Facial Expression: None, normal Lips and Perioral Area: None, normal Jaw: None, normal Tongue: None, normal,Extremity Movements Upper (arms, wrists, hands, fingers): None, normal Lower (legs, knees, ankles, toes): None, normal, Trunk Movements Neck, shoulders, hips: None, normal, Overall Severity Severity of abnormal movements (highest score from questions above): None, normal Incapacitation due to abnormal movements: None, normal Patient's awareness of abnormal movements (rate only patient's report): No Awareness, Dental Status Current problems with teeth and/or dentures?: No Does patient usually wear dentures?: No  CIWA:  CIWA-Ar Total: 0  COWS:  COWS Total Score: 0   Psychiatric Specialty Exam: See Psychiatric Specialty Exam and Suicide Risk Assessment completed by Attending Physician prior to discharge.  Discharge destination:  Home  Is patient on multiple antipsychotic therapies at discharge:  No   Has Patient had three or more failed trials of  antipsychotic monotherapy by history:  No  Recommended Plan for Multiple Antipsychotic Therapies: NA     Medication List     As of 04/14/2012  3:17 PM    STOP taking these medications         clonazePAM 0.5 MG tablet   Commonly known as: KLONOPIN      finasteride 5 MG tablet   Commonly known as: PROSCAR      TAKE these medications      Indication    ARIPiprazole 5 MG tablet   Commonly known as: ABILIFY   Take 1 tablet (5 mg total) by mouth at bedtime. For mood control    Indication: Major Depressive Disorder      buPROPion 150 MG 24 hr tablet   Commonly known as: WELLBUTRIN XL   Take 1 tablet (150 mg total) by mouth daily. For depression    Indication: Major Depressive Disorder      escitalopram 20 MG tablet   Commonly known as: LEXAPRO   Take 1 tablet (20 mg total) by mouth daily. For depression       estradiol 2 MG tablet   Commonly known as: ESTRACE   Take 1 tablet (2 mg total) by mouth 3 (three) times daily.  For hormone supplement       spironolactone 100 MG tablet   Commonly known as: ALDACTONE   Take 0.5 tablets (50 mg total) by mouth daily. For hormone supplementation       traZODone 50 MG tablet   Commonly known as: DESYREL   Take 1 tablet (50 mg total) by mouth at bedtime as needed for sleep. For sleep/depression          Follow-up Information    Follow up with [facility]. (Office requires patient to call and make an appointment do to 3 prior no-shows.)    Contact information:   [address] [place], [place] [address] [phone]        Follow-up recommendations:  Activity:  as tolerated Other:  Keep all scheduled follow-up appointments as recommended.   Comments:  Take all your medications as prescribed by your mental healthcare provider. Report any adverse effects and or reactions from your medicines to your outpatient provider promptly. Patient is instructed and cautioned to not engage in alcohol and or illegal drug use  while on prescription medicines. In the event of worsening symptoms, patient is instructed to call the crisis hotline, 911 and or go to the nearest ED for appropriate evaluation and treatment of symptoms. Follow-up with your primary care provider for your other medical issues, concerns and or health care needs.    Total Discharge Time:  More than 30 minutes.  SignedArmandina Stammer I 04/14/2012, 3:17 PM

## 2012-04-14 NOTE — Progress Notes (Signed)
Pt discharged per MD orders; pt currently denies SI/HI and auditory/visual hallucinations; pt was given education by RN regarding follow-up appointments and medications and pt denied any questions or concerns about these instructions; pt was then escorted to search room to retrieve her belongings by RN before being discharged to hospital lobby. 

## 2012-04-14 NOTE — [facility] Suicide Risk Assessment (Signed)
Suicide Risk Assessment  Discharge Assessment     Demographic Factors:  Male, caucasian, unemployed  Mental Status Per Nursing Assessment::   On Admission:  Suicidal ideation indicated by patient  Current Mental Status by Physician: Patient alert and oriented to 4. Denies AH/Vh/HI/SI.  Loss Factors: Financial problems/change in socioeconomic status  Historical Factors: Impulsivity  Risk Reduction Factors:   Positive coping skills or problem solving skills  Continued Clinical Symptoms:  Depression:   Recent sense of peace/wellbeing Alcohol/Substance Abuse/Dependencies  Cognitive Features That Contribute To Risk:  Cognitively intact  Suicide Risk:  Minimal: No identifiable suicidal ideation.  Patients presenting with no risk factors but with morbid ruminations; may be classified as minimal risk based on the severity of the depressive symptoms  Discharge Diagnoses:   AXIS I:  Alcohol Abuse and Depressive Disorder NOS AXIS II:  No diagnosis AXIS III:   Past Medical History  Diagnosis Date  . Depression   . Hepatitis C   . 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 Cellulitis  Of  Left Forearm 01/10/2012    From IVDA   AXIS IV:  economic problems and housing problems AXIS V:  61-70 mild symptoms  Plan Of Care/Follow-up recommendations:  Activity:  normal Diet:  normal Follow up with outpatient appointments. Attend NA/AA meetings.  Is patient on multiple antipsychotic therapies at discharge:  No   Has Patient had three or more failed trials of antipsychotic monotherapy by history:  No  Recommended Plan for Multiple Antipsychotic Therapies: NA  Deneen Slager 04/14/2012, 10:29 AM

## 2012-04-14 NOTE — Progress Notes (Signed)
Patient ID: Randy [name], male   DOB: 07-Aug-1970, 41 y.o.   MRN: [id number] D: Patient in bed sleeping. Respiration regular and unlabored. No sign of distress noted at this time A: 15 mins checks for  safety R: Patient remains asleep.

## 2012-04-14 NOTE — Progress Notes (Signed)
D: Patient denies SI/HI and auditory and visual hallucinations. The patient has a depressed mood and affect. The patient is attending groups and interacting appropriately within the milieu. The patient states that she "feels a little bit better today" and that she is "moving forward."  A: Patient given emotional support from RN. Patient encouraged to come to staff with concerns and/or questions. Patient's medication routine continued. Patient's orders and plan of care reviewed.  R: Patient remains appropriate and cooperative. Will continue to monitor patient q15 minutes for safety.

## 2012-04-14 NOTE — Social Work (Signed)
[facility] LCSW Aftercare Discharge Planning Group Note  04/14/2012  8:45 AM  Participation Quality:  Appropriate and Attentive  Affect:  Blunted  Cognitive:  Appropriate  Insight:  Improving  Engagement in Group:  Improving  Modes of Intervention:  Discussion, Education and Support  Summary of Progress/Problems: Pt attended discharge planning group and actively participated in group.  CSW provided pt with today's workbook.  Patient feels good about being able to leave today and says she has a friend who can pick her up if she is able to leave today. Patient reported a 3-4 on anxiety and admits to having fleeting suicidal ideations but says this is her baseline. Patient agreed to follow up with outpatient care and to schedule her appointment as requested by her doctor and said the friend who was taking her home would be a source of support for her if thoughts of harming herself returned.   [facility] LCSW Group Therapy  04/14/2012 1:15 PM  Type of Therapy:  Group Therapy  Participation Level:  Patient was discharged prior to group Patton Salles, LCSW 04/14/2012 6:50 AM

## 2012-04-14 NOTE — Progress Notes (Signed)
Psychoeducational Group Note  Date:  04/14/2012 Time:  1000  Group Topic/Focus:  Making Healthy Choices:   The focus of this group is to help patients identify negative/unhealthy choices they were using prior to admission and identify positive/healthier coping strategies to replace them upon discharge.  Participation Level:  Minimal  Participation Quality:  Resistant  Affect:  Appropriate  Cognitive:  Appropriate  Insight:  Improving  Engagement in Group:  Engaged  Additional Comments:  Patient participated in group but was resistant to sharing personal views and experiences. Patient did identify several ways in which she could better cope with stress.  Nestor Ramp MCCOLLUM 04/14/2012, 12:07 PM

## 2012-04-14 NOTE — Progress Notes (Signed)
Reviewed, agree with A & R.

## 2012-04-14 NOTE — Progress Notes (Signed)
Psychoeducational Group Note  Date:  04/13/2012 Time:  2015  Group Topic/Focus:  Wrap-Up Group:   The focus of this group is to help patients review their daily goal of treatment and discuss progress on daily workbooks.  Participation Level:  Active  Participation Quality:  Redirectable, Resistant and Supportive  Affect:  Flat  Cognitive:  Oriented  Insight:  Lacking  Engagement in Group:  Engaged  Additional Comments:  Patient shared that the day was good and recreation therapy was helpful.  Tong Pieczynski, Newton Pigg 04/14/2012, 6:32 AM

## 2012-04-15 NOTE — Discharge Summary (Signed)
Reviewed

## 2012-04-18 NOTE — Progress Notes (Signed)
Patient Discharge Instructions:  After Visit Summary (AVS):   Faxed to:  04/18/12 Psychiatric Admission Assessment Note:   Faxed to:  04/18/12 Suicide Risk Assessment - Discharge Assessment:   Faxed to:  04/18/12 Faxed/Sent to the Next Level Care provider:  04/18/12 Faxed to [facility] @ [phone]  Jerelene Redden, 04/18/2012, 3:34 PM

## 2012-05-23 DIAGNOSIS — E288 Other ovarian dysfunction: Secondary | ICD-10-CM | POA: Diagnosis not present

## 2012-06-23 ENCOUNTER — Encounter (HOSPITAL_COMMUNITY): Payer: Self-pay | Admitting: Emergency Medicine

## 2012-06-23 ENCOUNTER — Emergency Department (EMERGENCY_DEPARTMENT_HOSPITAL)
Admission: EM | Admit: 2012-06-23 | Discharge: 2012-06-24 | Disposition: A | Discharge status: Other Acute Inpatient Hospital | Payer: Medicare Other | Source: Home / Self Care | Attending: Emergency Medicine | Admitting: Emergency Medicine

## 2012-06-23 ENCOUNTER — Ambulatory Visit (HOSPITAL_COMMUNITY)
Admission: RE | Admit: 2012-06-23 | Discharge: 2012-06-23 | Disposition: A | Discharge status: Home / Self Care | Payer: Medicare Other | Attending: Psychiatry | Admitting: Psychiatry

## 2012-06-23 ENCOUNTER — Encounter (HOSPITAL_COMMUNITY): Payer: Self-pay | Admitting: Licensed Clinical Social Worker

## 2012-06-23 DIAGNOSIS — F329 Major depressive disorder, single episode, unspecified: Secondary | ICD-10-CM | POA: Insufficient documentation

## 2012-06-23 DIAGNOSIS — F1910 Other psychoactive substance abuse, uncomplicated: Secondary | ICD-10-CM | POA: Insufficient documentation

## 2012-06-23 DIAGNOSIS — F339 Major depressive disorder, recurrent, unspecified: Secondary | ICD-10-CM

## 2012-06-23 DIAGNOSIS — R45851 Suicidal ideations: Secondary | ICD-10-CM | POA: Insufficient documentation

## 2012-06-23 DIAGNOSIS — Z872 Personal history of diseases of the skin and subcutaneous tissue: Secondary | ICD-10-CM | POA: Insufficient documentation

## 2012-06-23 DIAGNOSIS — F1410 Cocaine abuse, uncomplicated: Secondary | ICD-10-CM

## 2012-06-23 DIAGNOSIS — Z8619 Personal history of other infectious and parasitic diseases: Secondary | ICD-10-CM | POA: Insufficient documentation

## 2012-06-23 DIAGNOSIS — F3289 Other specified depressive episodes: Secondary | ICD-10-CM | POA: Insufficient documentation

## 2012-06-23 DIAGNOSIS — E349 Endocrine disorder, unspecified: Secondary | ICD-10-CM | POA: Insufficient documentation

## 2012-06-23 DIAGNOSIS — F17200 Nicotine dependence, unspecified, uncomplicated: Secondary | ICD-10-CM | POA: Insufficient documentation

## 2012-06-23 DIAGNOSIS — Z79899 Other long term (current) drug therapy: Secondary | ICD-10-CM | POA: Insufficient documentation

## 2012-06-23 LAB — CBC
Platelets: 241 10*3/uL (ref 150–400)
RDW: 13.3 % (ref 11.5–15.5)
WBC: 12.5 10*3/uL — ABNORMAL HIGH (ref 4.0–10.5)

## 2012-06-23 LAB — COMPREHENSIVE METABOLIC PANEL
AST: 36 U/L (ref 0–37)
Albumin: 3.8 g/dL (ref 3.5–5.2)
Alkaline Phosphatase: 70 U/L (ref 39–117)
Chloride: 102 mEq/L (ref 96–112)
Potassium: 4.2 mEq/L (ref 3.5–5.1)
Total Bilirubin: 0.2 mg/dL — ABNORMAL LOW (ref 0.3–1.2)

## 2012-06-23 LAB — RAPID URINE DRUG SCREEN, HOSP PERFORMED
Amphetamines: NOT DETECTED
Barbiturates: NOT DETECTED
Cocaine: POSITIVE — AB
Tetrahydrocannabinol: NOT DETECTED

## 2012-06-23 MED ORDER — ESCITALOPRAM OXALATE 10 MG PO TABS
20.0000 mg | ORAL_TABLET | Freq: Every day | ORAL | Status: DC
  Administered 2012-06-23 – 2012-06-24 (×2): 20 mg via ORAL
  Filled 2012-06-23 (×2): qty 2

## 2012-06-23 MED ORDER — ESTRADIOL 2 MG PO TABS
2.0000 mg | ORAL_TABLET | Freq: Three times a day (TID) | ORAL | Status: DC
  Administered 2012-06-23 – 2012-06-24 (×4): 2 mg via ORAL
  Filled 2012-06-23 (×5): qty 1

## 2012-06-23 MED ORDER — ACETAMINOPHEN 325 MG PO TABS: 650.0000 mg | ORAL_TABLET | ORAL | Status: DC | PRN

## 2012-06-23 MED ORDER — SPIRONOLACTONE 50 MG PO TABS
50.0000 mg | ORAL_TABLET | Freq: Every day | ORAL | Status: DC
  Administered 2012-06-23 – 2012-06-24 (×2): 50 mg via ORAL
  Filled 2012-06-23 (×2): qty 1

## 2012-06-23 MED ORDER — BUPROPION HCL ER (XL) 150 MG PO TB24
150.0000 mg | ORAL_TABLET | Freq: Every day | ORAL | Status: DC
  Administered 2012-06-23 – 2012-06-24 (×2): 150 mg via ORAL
  Filled 2012-06-23 (×2): qty 1

## 2012-06-23 MED ORDER — ALUM & MAG HYDROXIDE-SIMETH 200-200-20 MG/5ML PO SUSP: 30.0000 mL | ORAL | Status: DC | PRN

## 2012-06-23 MED ORDER — IBUPROFEN 600 MG PO TABS
600.0000 mg | ORAL_TABLET | Freq: Three times a day (TID) | ORAL | Status: DC | PRN
  Administered 2012-06-23: 600 mg via ORAL
  Filled 2012-06-23: qty 1

## 2012-06-23 MED ORDER — NICOTINE 21 MG/24HR TD PT24
21.0000 mg | MEDICATED_PATCH | Freq: Every day | TRANSDERMAL | Status: DC
  Administered 2012-06-23 – 2012-06-24 (×2): 21 mg via TRANSDERMAL
  Filled 2012-06-23 (×2): qty 1

## 2012-06-23 MED ORDER — ARIPIPRAZOLE 5 MG PO TABS
5.0000 mg | ORAL_TABLET | Freq: Every day | ORAL | Status: DC
  Administered 2012-06-23 – 2012-06-24 (×2): 5 mg via ORAL
  Filled 2012-06-23 (×2): qty 1

## 2012-06-23 MED ORDER — ONDANSETRON HCL 4 MG PO TABS: 4.0000 mg | ORAL_TABLET | Freq: Three times a day (TID) | ORAL | Status: DC | PRN

## 2012-06-23 NOTE — ED Notes (Signed)
Pt has been scrubbed and wanded.  2 pt belonging bags and an animal print bag.
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
## 2012-06-23 NOTE — ED Notes (Signed)
Attempted to call report.  Nurse busy. 

## 2012-06-23 NOTE — ED Notes (Signed)
Called report to Ethel.

## 2012-06-23 NOTE — ED Notes (Signed)
Pt from [facility] for med clearance.  Voluntary at this time.  Depressed w/ suicidal ideations.  Abusing cocaine and heroin.  [facility] does not have any beds.

## 2012-06-23 NOTE — ED Provider Notes (Signed)
History     CSN: [id number]  Arrival date & time 06/23/12  1343   First MD Initiated Contact with Patient 06/23/12 1351      Chief Complaint  Patient presents with  . Medical Clearance    (Consider location/radiation/quality/duration/timing/severity/associated sxs/prior treatment) HPI Comments: Transgender male presents with complaint of suicidal ideation, worsening depression, and cocaine and heroin abuse. Patient went to behavioral health today and was sent to the emergency department for medical clearance. She has had some chills but otherwise denies medical complaints at this time. Denies alcohol use. Last use of cocaine was yesterday. Onset of symptoms gradual. Course is constant. Nothing makes symptoms better or worse.  The history is provided by the patient.    Past Medical History  Diagnosis Date  . Depression   . Hepatitis C   . 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 Cellulitis  Of  Left Forearm 01/10/2012    From IVDA    Past Surgical History  Procedure Laterality Date  . I&d extremity  01/13/2012    Procedure: IRRIGATION AND DEBRIDEMENT EXTREMITY;  Surgeon: Kennieth Rad, MD;  Location: [facility];  Service: Orthopedics;  Laterality: Left;    Family History  Problem Relation Age of Onset  . Idiopathic pulmonary fibrosis Father     History  Substance Use Topics  . Smoking status: Current Every Day Smoker -- 1.50 packs/day for 20 years    Types: Cigarettes  . Smokeless tobacco: Never Used  . Alcohol Use: 3.6 oz/week    6 Cans of beer per week     Comment: 1-2 drinks 1-2 times a week.      Review of Systems  Constitutional: Positive for chills. Negative for fever.  HENT: Negative for sore throat and rhinorrhea.   Eyes: Negative for redness.  Respiratory: Negative for cough.   Cardiovascular: Negative for chest pain.  Gastrointestinal: Negative for nausea, vomiting, abdominal pain and diarrhea.  Genitourinary: Negative for dysuria.   Musculoskeletal: Negative for myalgias.  Skin: Negative for rash.  Neurological: Negative for headaches.  Psychiatric/Behavioral: Positive for suicidal ideas. Negative for self-injury.    Allergies  Penicillins  Home Medications   Current Outpatient Rx  Name  Route  Sig  Dispense  Refill  . ARIPiprazole (ABILIFY) 5 MG tablet   Oral   Take 1 tablet (5 mg total) by mouth at bedtime. For mood control   30 tablet   0   . buPROPion (WELLBUTRIN XL) 150 MG 24 hr tablet   Oral   Take 1 tablet (150 mg total) by mouth daily. For depression   30 tablet   0   . escitalopram (LEXAPRO) 20 MG tablet   Oral   Take 1 tablet (20 mg total) by mouth daily. For depression   30 tablet   0   . estradiol (ESTRACE) 2 MG tablet   Oral   Take 1 tablet (2 mg total) by mouth 3 (three) times daily. For hormone supplement         . spironolactone (ALDACTONE) 100 MG tablet   Oral   Take 0.5 tablets (50 mg total) by mouth daily. For hormone supplementation           BP 134/77  Pulse 97  Temp(Src) 98.4 F (36.9 C) (Oral)  Resp 16  SpO2 100%  Physical Exam  Nursing note and vitals reviewed. Constitutional: He appears well-developed and well-nourished.  HENT:  Head: Normocephalic and atraumatic.  Eyes: Conjunctivae are normal. Right eye exhibits  no discharge. Left eye exhibits no discharge.  Neck: Normal range of motion. Neck supple.  Cardiovascular: Normal rate, regular rhythm and normal heart sounds.   Pulmonary/Chest: Effort normal and breath sounds normal.  Abdominal: Soft. There is no tenderness.  Neurological: He is alert.  Skin: Skin is warm and dry.  Psychiatric: He exhibits a depressed mood. He expresses suicidal ideation. He expresses no homicidal ideation.    ED Course  Procedures (including critical care time)  Labs Reviewed  CBC - Abnormal; Notable for the following:    WBC 12.5 (*)    All other components within normal limits  COMPREHENSIVE METABOLIC PANEL -  Abnormal; Notable for the following:    ALT 79 (*)    Total Bilirubin 0.2 (*)    GFR calc non Af Amer 79 (*)    All other components within normal limits  ETHANOL  URINE RAPID DRUG SCREEN (HOSP PERFORMED)   No results found.   1. Suicidal ideation   2. Polysubstance abuse including IVDA (heroin), cocaine, marijuana     2:18 PM Patient seen and examined. Medical clearance labs ordered. Holding orders completed.    Vital signs reviewed and are as follows: Filed Vitals:   06/23/12 1405  BP: 134/77  Pulse: 97  Temp: 98.4 F (36.9 C)  Resp: 16   2:51 PM Handoff to Dr. Oletta Lamas at shift change.   MDM  SI, polysubstance abuse.         Renne Crigler, [place] 06/23/12 1455
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
## 2012-06-24 ENCOUNTER — Inpatient Hospital Stay (HOSPITAL_COMMUNITY)
Admission: AD | Admit: 2012-06-24 | Discharge: 2012-06-30 | DRG: 885 | Disposition: A | Discharge status: Home / Self Care | Payer: Medicare Other | Source: Intra-hospital | Attending: Psychiatry | Admitting: Psychiatry

## 2012-06-24 DIAGNOSIS — F332 Major depressive disorder, recurrent severe without psychotic features: Principal | ICD-10-CM | POA: Diagnosis present

## 2012-06-24 DIAGNOSIS — F1994 Other psychoactive substance use, unspecified with psychoactive substance-induced mood disorder: Secondary | ICD-10-CM

## 2012-06-24 DIAGNOSIS — F411 Generalized anxiety disorder: Secondary | ICD-10-CM | POA: Diagnosis present

## 2012-06-24 DIAGNOSIS — F1410 Cocaine abuse, uncomplicated: Secondary | ICD-10-CM

## 2012-06-24 DIAGNOSIS — Z720 Tobacco use: Secondary | ICD-10-CM

## 2012-06-24 DIAGNOSIS — F419 Anxiety disorder, unspecified: Secondary | ICD-10-CM | POA: Diagnosis present

## 2012-06-24 DIAGNOSIS — F17200 Nicotine dependence, unspecified, uncomplicated: Secondary | ICD-10-CM | POA: Diagnosis present

## 2012-06-24 DIAGNOSIS — F640 Transsexualism: Secondary | ICD-10-CM

## 2012-06-24 DIAGNOSIS — F339 Major depressive disorder, recurrent, unspecified: Secondary | ICD-10-CM | POA: Diagnosis present

## 2012-06-24 DIAGNOSIS — B1920 Unspecified viral hepatitis C without hepatic coma: Secondary | ICD-10-CM

## 2012-06-24 DIAGNOSIS — Z79899 Other long term (current) drug therapy: Secondary | ICD-10-CM

## 2012-06-24 DIAGNOSIS — F1120 Opioid dependence, uncomplicated: Secondary | ICD-10-CM

## 2012-06-24 DIAGNOSIS — E871 Hypo-osmolality and hyponatremia: Secondary | ICD-10-CM

## 2012-06-24 DIAGNOSIS — F1420 Cocaine dependence, uncomplicated: Secondary | ICD-10-CM | POA: Diagnosis present

## 2012-06-24 DIAGNOSIS — F1910 Other psychoactive substance abuse, uncomplicated: Secondary | ICD-10-CM | POA: Diagnosis present

## 2012-06-24 MED ORDER — ALUM & MAG HYDROXIDE-SIMETH 200-200-20 MG/5ML PO SUSP: 30.0000 mL | ORAL | Status: DC | PRN

## 2012-06-24 MED ORDER — ACETAMINOPHEN 325 MG PO TABS
650.0000 mg | ORAL_TABLET | Freq: Four times a day (QID) | ORAL | Status: DC | PRN
  Administered 2012-06-25: 650 mg via ORAL

## 2012-06-24 MED ORDER — MAGNESIUM HYDROXIDE 400 MG/5ML PO SUSP: 30.0000 mL | Freq: Every day | ORAL | Status: DC | PRN

## 2012-06-24 MED ORDER — ESTRADIOL 2 MG PO TABS
2.0000 mg | ORAL_TABLET | Freq: Three times a day (TID) | ORAL | Status: DC
  Administered 2012-06-25: 2 mg via ORAL
  Filled 2012-06-24 (×4): qty 1

## 2012-06-24 MED ORDER — BUPROPION HCL ER (XL) 150 MG PO TB24
150.0000 mg | ORAL_TABLET | Freq: Every day | ORAL | Status: DC
  Administered 2012-06-25 – 2012-06-30 (×6): 150 mg via ORAL
  Filled 2012-06-24 (×5): qty 1
  Filled 2012-06-24: qty 3
  Filled 2012-06-24 (×2): qty 1

## 2012-06-24 MED ORDER — SPIRONOLACTONE 50 MG PO TABS
50.0000 mg | ORAL_TABLET | Freq: Every day | ORAL | Status: DC
  Administered 2012-06-25: 50 mg via ORAL
  Filled 2012-06-24 (×2): qty 1

## 2012-06-24 MED ORDER — ESCITALOPRAM OXALATE 20 MG PO TABS
20.0000 mg | ORAL_TABLET | Freq: Every day | ORAL | Status: DC
  Administered 2012-06-25 – 2012-06-27 (×3): 20 mg via ORAL
  Filled 2012-06-24 (×5): qty 1

## 2012-06-24 MED ORDER — ARIPIPRAZOLE 5 MG PO TABS
5.0000 mg | ORAL_TABLET | Freq: Every day | ORAL | Status: DC
  Administered 2012-06-25 – 2012-06-29 (×5): 5 mg via ORAL
  Filled 2012-06-24 (×4): qty 1
  Filled 2012-06-24: qty 3
  Filled 2012-06-24 (×3): qty 1

## 2012-06-24 NOTE — Progress Notes (Signed)
Pt received lunch tray 

## 2012-06-24 NOTE — ED Provider Notes (Signed)
Medical screening examination/treatment/procedure(s) were performed by non-physician practitioner and as supervising physician I was immediately available for consultation/collaboration.  Emmajane Altamura, MD 06/24/12 0707 

## 2012-06-24 NOTE — Progress Notes (Signed)
Per chart review, pt accepted [facility] pending private room.   Catha Gosselin, LCSWA  [phone] 06/24/2012 [id number]

## 2012-06-24 NOTE — Consult Note (Signed)
Reason for Consult: Depression substance abuse and suicidal thoughts Referring Physician: Dr. Frederick Peers Glander is an 42 y.o. male.  HPI: Patient was seen and chart reviewed. Patient (male to male transgender person) has been suffering with the symptoms of depression and safety and substance abuse over a month. Reportedly crying spells disturbance of sleep appetite social withdrawal and loss of energy sadness gait hopelessness. Patient has awake suicidal thoughts and plans. Patient was known for using crack cocaine and daily IV heroin. Patient reported her last use of IV heroin was 3 days ago. Using that screen was positive for cocaine.  MSE: Patient stated mood was depressed and affect was irritable. Patient has decreased psychomotor activity. Patient has normal rate rhythm and volume of speech. Patient has no evidence of psychotic symptoms patient has a vague suicidal ideation but denied homicidal ideation. Patient does not want to discuss about her substance abuse issues at this time.  Past Medical History  Diagnosis Date  . Depression   . Hepatitis C   . 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 Cellulitis  Of  Left Forearm 01/10/2012    From IVDA    Past Surgical History  Procedure Laterality Date  . I&d extremity  01/13/2012    Procedure: IRRIGATION AND DEBRIDEMENT EXTREMITY;  Surgeon: Kennieth Rad, MD;  Location: [facility];  Service: Orthopedics;  Laterality: Left;    Family History  Problem Relation Age of Onset  . Idiopathic pulmonary fibrosis Father     Social History:  reports that he has been smoking Cigarettes.  He has a 30 pack-year smoking history. He has never used smokeless tobacco. He reports that he drinks about 3.6 ounces of alcohol per week. He reports that he uses illicit drugs (Cocaine, Marijuana, Heroin, and "Crack" cocaine) about 7 times per week.  Allergies:  Allergies  Allergen Reactions  . Penicillins     Swollen joints     Medications:  I have reviewed the patient's current medications.  Results for orders placed during the hospital encounter of 06/23/12 (from the past 48 hour(s))  URINE RAPID DRUG SCREEN (HOSP PERFORMED)     Status: Abnormal   Collection Time    06/23/12  2:05 PM      Result Value Range   Opiates NONE DETECTED  NONE DETECTED   Cocaine POSITIVE (*) NONE DETECTED   Benzodiazepines NONE DETECTED  NONE DETECTED   Amphetamines NONE DETECTED  NONE DETECTED   Tetrahydrocannabinol NONE DETECTED  NONE DETECTED   Barbiturates NONE DETECTED  NONE DETECTED   Comment:            DRUG SCREEN FOR MEDICAL PURPOSES     ONLY.  IF CONFIRMATION IS NEEDED     FOR ANY PURPOSE, NOTIFY LAB     WITHIN 5 DAYS.                LOWEST DETECTABLE LIMITS     FOR URINE DRUG SCREEN     Drug Class       Cutoff (ng/mL)     Amphetamine      1000     Barbiturate      200     Benzodiazepine   200     Tricyclics       300     Opiates          300     Cocaine          300     THC  50  CBC     Status: Abnormal   Collection Time    06/23/12  2:10 PM      Result Value Range   WBC 12.5 (*) 4.0 - 10.5 K/uL   RBC 5.05  4.22 - 5.81 MIL/uL   Hemoglobin 15.8  13.0 - 17.0 g/dL   HCT 16.1  09.6 - 04.5 %   MCV 93.5  78.0 - 100.0 fL   MCH 31.3  26.0 - 34.0 pg   MCHC 33.5  30.0 - 36.0 g/dL   RDW 40.9  81.1 - 91.4 %   Platelets 241  150 - 400 K/uL  COMPREHENSIVE METABOLIC PANEL     Status: Abnormal   Collection Time    06/23/12  2:10 PM      Result Value Range   Sodium 136  135 - 145 mEq/L   Potassium 4.2  3.5 - 5.1 mEq/L   Chloride 102  96 - 112 mEq/L   CO2 25  19 - 32 mEq/L   Glucose, Bld 95  70 - 99 mg/dL   BUN 10  6 - 23 mg/dL   Creatinine, Ser 7.82  0.50 - 1.35 mg/dL   Calcium 9.3  8.4 - 95.6 mg/dL   Total Protein 7.7  6.0 - 8.3 g/dL   Albumin 3.8  3.5 - 5.2 g/dL   AST 36  0 - 37 U/L   ALT 79 (*) 0 - 53 U/L   Alkaline Phosphatase 70  39 - 117 U/L   Total Bilirubin 0.2 (*) 0.3 - 1.2 mg/dL   GFR calc non Af  Amer 79 (*) >90 mL/min   GFR calc Af Amer >90  >90 mL/min   Comment:            The eGFR has been calculated     using the CKD EPI equation.     This calculation has not been     validated in all clinical     situations.     eGFR's persistently     <90 mL/min signify     possible Chronic Kidney Disease.  ETHANOL     Status: None   Collection Time    06/23/12  2:10 PM      Result Value Range   Alcohol, Ethyl (B) <11  0 - 11 mg/dL   Comment:            LOWEST DETECTABLE LIMIT FOR     SERUM ALCOHOL IS 11 mg/dL     FOR MEDICAL PURPOSES ONLY    No results found.  Positive for anxiety, bad mood, behavior problems, borderline personality disorder, depression, illegal drug usage, mood swings and sleep disturbance Blood pressure 99/62, pulse 77, temperature 98.5 F (36.9 C), temperature source Oral, resp. rate 18, SpO2 97.00%.   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 Patient does not want to start medication in the emergency department unwilling to resume medication management at behavior health hospital.  [facility] R. 06/24/2012, 12:29 PM

## 2012-06-24 NOTE — ED Provider Notes (Signed)
Patient with suicidal thoughts and substance abuse. Sleeping this morning.   Randy [name]. Rubin Payor, MD 06/24/12 [phone]

## 2012-06-25 ENCOUNTER — Encounter (HOSPITAL_COMMUNITY): Payer: Self-pay

## 2012-06-25 DIAGNOSIS — F1994 Other psychoactive substance use, unspecified with psychoactive substance-induced mood disorder: Secondary | ICD-10-CM

## 2012-06-25 DIAGNOSIS — F1910 Other psychoactive substance abuse, uncomplicated: Secondary | ICD-10-CM

## 2012-06-25 MED ORDER — NICOTINE 21 MG/24HR TD PT24
21.0000 mg | MEDICATED_PATCH | Freq: Every day | TRANSDERMAL | Status: DC
  Administered 2012-06-25 – 2012-06-30 (×6): 21 mg via TRANSDERMAL
  Filled 2012-06-25 (×8): qty 1

## 2012-06-25 NOTE — Progress Notes (Signed)
Found pt in her room resting in bed. She avoids eye contact and displays a flat affect. Mood depressed, withdrawn. States her headache was finally relieved once nicotine patch was applied. Endorses passive SI but is able to verbally contract for safety. Denies HI/AVH. Encouraged pt to be more visible in the milieu, attend groups. Pt reluctant but did attend evening wrap up group. Pt continues to report cravings for substances and reports her recent relapse was x 3 months. Lawrence Marseilles

## 2012-06-25 NOTE — H&P (Signed)
I have examined there patient and agree with the findings. I certify that inpatient services furnished can reasonably be expected to improve the patient's condition. Jaala Bohle, MD, MSP[facility]  

## 2012-06-25 NOTE — [facility] Suicide Risk Assessment (Signed)
Suicide Risk Assessment  Admission Assessment     Nursing information obtained from:  Patient Demographic factors:  Male Current Mental Status per Physician Patient denies suicidal or homicidal ideation, hallucinations, illusions, or delusions. Patient engages with good eye contact, is able to focus adequately in a one to one setting, and has clear goal directed thoughts. Patient speaks with a natural conversational volume, rate, and tone. Anxiety was reported at 6 on a scale of 1 the least and 10 the most. Depression was reported at 8 to 9 on the same scale. Patient is oriented times 4, recent and remote memory intact. Judgement: is impaired by personality, mental health, and substance abuse issues Insight: is impaired by personality, mental health, and substance abuse issues  Loss Factors:  Loss of significant relationship;Decline in physical health Historical Factors:  NA Risk Reduction Factors:  NA  CLINICAL FACTORS:   Depression:   Comorbid alcohol abuse/dependence Impulsivity Alcohol/Substance Abuse/Dependencies Previous Psychiatric Diagnoses and Treatments  COGNITIVE FEATURES THAT CONTRIBUTE TO RISK:  Closed-mindedness Loss of executive function Polarized thinking Thought constriction (tunnel vision)    SUICIDE RISK:   Moderate:  Frequent suicidal ideation with limited intensity, and duration, some specificity in terms of plans, no associated intent, good self-control, limited dysphoria/symptomatology, some risk factors present, and identifiable protective factors, including available and accessible social support.  PLAN OF CARE: 1 Admit for crisis management and stabilization.  Estimate length of stay is 3 to 5 days.  2. Medication management to reduce current symptoms to base line and improve the patient's overall level of functioning.  3. Treat health problems as indicated:  4. Develop treatment plan to decrease risk of relapse upon discharge and the need for  readmission.  5. Psycho-social education regarding relapse prevention and self-care.  6. Review and reinstate any pertinent home medication for other health issues where appropriate.  7. Call for Consult with [facility] for additional specialty patient services as needed.  I certify that inpatient services furnished can reasonably be expected to improve the patient's condition.  Randy [name] 06/25/2012, 12:47 PM

## 2012-06-25 NOTE — Progress Notes (Signed)
Adult Psychosocial Assessment Update Interdisciplinary Team  Previous Behavior Health Hospital admissions/discharges:  Admissions Discharges  Date:  04/09/12 Date:  04/14/12  Date:  01/13/12 Date:  01/15/12  Date:  08/13/11 Date:08/16/11  Date:  11/14/09,  09/23/09 Date:  Date:  04/09/07, 07/01/06, 01/12/06 Date:   Changes since the last Psychosocial Assessment (including adherence to outpatient mental health and/or substance abuse treatment, situational issues contributing to decompensation and/or relapse). "Never really got myself together after I left here last time.  Been staying in hotels.  Have been spending my disability check on cocaine and heroin.  Has been using cocaine, IV heroin, marijuana, and alcohol daily.  States her disability SSDI is about $1100.  She sees Ellis Savage at [facility], but owes the practice money so has been missing appointments.  She believes, however, that she is allowed to return there if she will make payments on the past due amount.  She ontinues to take her transgender hormones, prescribed by Dr. Rosario Jacks in [place], seen 2-3 weeks ago.      States she is here to try to quit the drugs, needs help to do this.  Has come here several times, then usually makes it 1-2 weeks at most prior to relapse.  Has never been to a longer term rehab.  Has been trying to quit off and on pretty seriously a year, since BF and her had a fight and split up.  BF is due to get out of prison, but Cruise is not sure if they would be getting back together.  Is depressed severely with suicidal ideation.  Has a court date on 2/28 for charges related to cocaine possession and drug paraphernalia, and states this is the first time she has had charges.           Discharge Plan 1. Will you be returning to the same living situation after discharge?   Yes:  XX No:      If no, what is your plan?    Will go back to a hotel if cannot find housing that will let her sign a lease -- credit is  not good.  Tried to move into some places and could not sign a lease.  When asked about an [facility], was very negative.       2. Would you like a referral for services when you are discharged? Yes:     If yes, for what services?  No:   XX    Pretty sure it would be good to try a 12-step meeting, and see how that goes.  Thinks can return to see Ellis Savage, but needs to start making payments on the amount owed.  Drug Court may be an option, and she states that she has been talking to her attorney about this.  Is mildly interested in long-term rehab.       Summary and Recommendations (to be completed by the evaluator) This is a 42yo Caucasian transgender male to male, who is hospitalized with increased depression and suicidal ideation, daily marijuana, alcohol, cocaine, and IV heroin use.  She has been living in a hotel, but spending all her money on drugs.  She has been skipping her psychiatric appointments at [facility] with Ellis Savage, due to having a bill overdue there.  She has never been to a long-term rehab, and is only mildly interested in this possibility.  She does not know where she will live upon discharge, as her credit is bad and she  has been unsuccessful in finding someone who will let her sign a lease.  She is not interested in an [employer].  She has her first drug-related charge coming to court on 07/08/12 and is hopeful her attorney can get her into Drug Court.  She would benefit from safety monitoring, medication evaluation, psychoeducation, group therapy, and discharge planning to link with ongoing resources.                        Signature:  Sarina Ser, 06/25/2012 9:17 AM

## 2012-06-25 NOTE — Progress Notes (Addendum)
Patient ID: Randy [name], male   DOB: Oct 13, 1970, 42 y.o.   MRN: [id number] Admission note: D:Patient is a  Voluntary admission in no acute distress for depression and suicide attempt with no specific plan. Pt report increase in depression over a month. Pt mood/affect is depressed and flat. Pt stated she lives in an extended stay motel with a friend. Pt has a court date on 07/08/2012 for possession of cocaine. Pt has a hx of cellulitis of left forearm from iv drug abuse. Pt report stressor as no family support. Pt denies SI/HI/AVH and pain.  A: Pt admitted to unit per protocol, skin assessment and search done.  Pt educated on therapeutic milieu rules. Pt was introduced to milieu by nursing staff. Fall risk safety plan explained to the patient. Writer offered snacks, pt accepted.   R: Pt was receptive to education. Pt contracted for safety, 15 min safety checks started. Clinical research associate offered support

## 2012-06-25 NOTE — H&P (Signed)
Psychiatric Admission Assessment Adult  Patient Identification:  Randy [name] Date of Evaluation:  06/25/2012 Chief Complaint:  MDD  From Cocaine Heroin THC   UDS + cocaine  History of Present Illness: Was last here 11/30- 12/5/2013for same complaints of worsening depression with suicidal thoughts.Says he/she never got it together after last discharge. He/she is spending the disability checks on drugs and hotels. The partner was due to be released from prison 2/13 but due to snow release  was delayed.  Presented to [facility] alone and was sent to [facility] for medical clearance.  Has court 2/28 for charges related to cocaine possession drug paraphernalia and maintaining a dwelling. Has poor insight and judgement accepts little to no responsibility for his behavior and is unrealistic in his expectations for "HELP". Will need to contact Dr.Donald Pittaway  [phone] [place] for recommendations regarding hormonal therapy.    Associated Signs/Synptoms: Depression Symptoms:  depressed mood, fatigue, feelings of worthlessness/guilt, hopelessness, suicidal thoughts without plan, anxiety, insomnia, (Hypo) Manic Symptoms:  Distractibility, Financial Extravagance, Impulsivity, Irritable Mood, Labiality of Mood, Anxiety Symptoms:  From cocaine  Psychotic Symptoms:  No AVH PTSD Symptoms: Negative  Psychiatric Specialty Exam: Physical Exam  Constitutional: He is oriented to person, place, and time. He appears well-developed and well-nourished.  HENT:  Head: Normocephalic and atraumatic.  Right Ear: External ear normal.  Left Ear: External ear normal.  Nose: Nose normal.  Mouth/Throat: Oropharynx is clear and moist.  Eyes: Conjunctivae and EOM are normal. Pupils are equal, round, and reactive to light.  Neck: Normal range of motion.  Cardiovascular: Normal rate, regular rhythm and normal heart sounds.   Respiratory: Effort normal and breath sounds normal.  GI: Soft. Bowel sounds are normal.   Musculoskeletal: Normal range of motion.  Neurological: He is alert and oriented to person, place, and time.  Skin: Skin is warm and dry.  Psychiatric: His speech is normal. Thought content normal. He is withdrawn. Cognition and memory are normal. He expresses impulsivity and inappropriate judgment. He exhibits a depressed mood. He is inattentive.    Review of Systems  Constitutional: Negative.   HENT: Negative.   Eyes: Negative.   Respiratory: Negative.   Cardiovascular: Negative.   Gastrointestinal: Negative.   Genitourinary: Negative.   Musculoskeletal: Positive for myalgias.  Skin: Negative.   Neurological: Negative.   Endo/Heme/Allergies: Negative.   Psychiatric/Behavioral: Positive for depression and substance abuse. The patient is nervous/anxious and has insomnia.     Blood pressure 86/48, pulse 99, temperature 98.2 F (36.8 C), temperature source Oral, resp. rate 16, height 5\' 6"  (1.676 m), weight 72.122 kg (159 lb).Body mass index is 25.68 kg/(m^2).  General Appearance: Disheveled  Eye Contact:  Minimal  Speech:  Normal Rate  Volume:  Normal  Mood:  Depressed and Dysphoric  Affect:  Congruent  Thought Process:  Goal Directed and Linear  Orientation:  Full (Time, Place, and Person)  Thought Content:  No AVH/psychosis   Suicidal Thoughts:  Yes.  without intent/plan  Homicidal Thoughts:  No  Memory:  Intact   Judgement:  Impaired  Insight:  Shallow  Psychomotor Activity:  Normal and Decreased  Concentration:  Fair  Recall:  Good  Akathisia:  No  Handed:  Right  AIMS (if indicated):     Assets:  Communication Skills Desire for Improvement Physical Health Resilience  Sleep:  Number of Hours: 6.5    Past Psychiatric History: Diagnosis:Substance Abuse induced Mood DO  Hospitalizations:several in the past year most recent 11/30-12/5  Outpatient Care: Misty Stanley  Poules NP   Substance Abuse Care: doesn't follow through   Self-Mutilation:no   Suicidal Attempts: drug  use sometimes IV   Violent Behaviors: denies    Past Medical History:   Past Medical History  Diagnosis Date  . Depression   . Hepatitis C   . 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 Cellulitis  Of  Left Forearm 01/10/2012    From IVDA   None. Allergies:   Allergies  Allergen Reactions  . Penicillins     Swollen joints    PTA Medications: Prescriptions prior to admission  Medication Sig Dispense Refill  . ARIPiprazole (ABILIFY) 5 MG tablet Take 1 tablet (5 mg total) by mouth at bedtime. For mood control  30 tablet  0  . buPROPion (WELLBUTRIN XL) 150 MG 24 hr tablet Take 1 tablet (150 mg total) by mouth daily. For depression  30 tablet  0  . escitalopram (LEXAPRO) 20 MG tablet Take 1 tablet (20 mg total) by mouth daily. For depression  30 tablet  0  . estradiol (ESTRACE) 2 MG tablet Take 1 tablet (2 mg total) by mouth 3 (three) times daily. For hormone supplement      . spironolactone (ALDACTONE) 100 MG tablet Take 0.5 tablets (50 mg total) by mouth daily. For hormone supplementation        Previous Psychotropic Medications:  Medication/Dose  Numerous in past  please refer to last discharge summary 04/14/2012.                Substance Abuse History in the last 12 months:  yes  Consequences of Substance Abuse: Has court 2/28 may get a felony.  Social History:  reports that he has been smoking Cigarettes.  He has a 30 pack-year smoking history. He has never used smokeless tobacco. He reports that he drinks about 3.6 ounces of alcohol per week. He reports that he uses illicit drugs (Cocaine, Marijuana, Heroin, and "Crack" cocaine) about 7 times per week. Additional Social History:                      Current Place of Residence:   Place of Birth:   Family Members: Marital Status:  Single Children:  Sons:  Daughters: Relationships: Education:  [employer] Problems/Performance: Religious Beliefs/Practices: History of Abuse  (Emotional/Phsycial/Sexual) Teacher, music History:  None. Legal History: Hobbies/Interests:  Family History:   Family History  Problem Relation Age of Onset  . Idiopathic pulmonary fibrosis Father     Results for orders placed during the hospital encounter of 06/23/12 (from the past 72 hour(s))  URINE RAPID DRUG SCREEN (HOSP PERFORMED)     Status: Abnormal   Collection Time    06/23/12  2:05 PM      Result Value Range   Opiates NONE DETECTED  NONE DETECTED   Cocaine POSITIVE (*) NONE DETECTED   Benzodiazepines NONE DETECTED  NONE DETECTED   Amphetamines NONE DETECTED  NONE DETECTED   Tetrahydrocannabinol NONE DETECTED  NONE DETECTED   Barbiturates NONE DETECTED  NONE DETECTED   Comment:            DRUG SCREEN FOR MEDICAL PURPOSES     ONLY.  IF CONFIRMATION IS NEEDED     FOR ANY PURPOSE, NOTIFY LAB     WITHIN 5 DAYS.                LOWEST DETECTABLE LIMITS     FOR URINE DRUG SCREEN  Drug Class       Cutoff (ng/mL)     Amphetamine      1000     Barbiturate      200     Benzodiazepine   200     Tricyclics       300     Opiates          300     Cocaine          300     THC              50  CBC     Status: Abnormal   Collection Time    06/23/12  2:10 PM      Result Value Range   WBC 12.5 (*) 4.0 - 10.5 K/uL   RBC 5.05  4.22 - 5.81 MIL/uL   Hemoglobin 15.8  13.0 - 17.0 g/dL   HCT 16.1  09.6 - 04.5 %   MCV 93.5  78.0 - 100.0 fL   MCH 31.3  26.0 - 34.0 pg   MCHC 33.5  30.0 - 36.0 g/dL   RDW 40.9  81.1 - 91.4 %   Platelets 241  150 - 400 K/uL  COMPREHENSIVE METABOLIC PANEL     Status: Abnormal   Collection Time    06/23/12  2:10 PM      Result Value Range   Sodium 136  135 - 145 mEq/L   Potassium 4.2  3.5 - 5.1 mEq/L   Chloride 102  96 - 112 mEq/L   CO2 25  19 - 32 mEq/L   Glucose, Bld 95  70 - 99 mg/dL   BUN 10  6 - 23 mg/dL   Creatinine, Ser 7.82  0.50 - 1.35 mg/dL   Calcium 9.3  8.4 - 95.6 mg/dL   Total Protein 7.7  6.0 - 8.3 g/dL    Albumin 3.8  3.5 - 5.2 g/dL   AST 36  0 - 37 U/L   ALT 79 (*) 0 - 53 U/L   Alkaline Phosphatase 70  39 - 117 U/L   Total Bilirubin 0.2 (*) 0.3 - 1.2 mg/dL   GFR calc non Af Amer 79 (*) >90 mL/min   GFR calc Af Amer >90  >90 mL/min   Comment:            The eGFR has been calculated     using the CKD EPI equation.     This calculation has not been     validated in all clinical     situations.     eGFR's persistently     <90 mL/min signify     possible Chronic Kidney Disease.  ETHANOL     Status: None   Collection Time    06/23/12  2:10 PM      Result Value Range   Alcohol, Ethyl (B) <11  0 - 11 mg/dL   Comment:            LOWEST DETECTABLE LIMIT FOR     SERUM ALCOHOL IS 11 mg/dL     FOR MEDICAL PURPOSES ONLY   Psychological Evaluations:  Assessment:   AXIS I:  Substance Abuse and Substance Induced Mood Disorder AXIS II:  Transgender but non-compliant with meds  AXIS III:   Past Medical History  Diagnosis Date  . Depression   . Hepatitis C   . 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 Cellulitis  Of  Left Forearm 01/10/2012  From IVDA   AXIS IV:  economic problems, educational problems, housing problems, occupational problems, other psychosocial or environmental problems, problems related to legal system/crime and problems with primary support group AXIS V:  51-60 moderate symptoms  Treatment Plan/Recommendations:   Restart psych meds Help find [employer]     Treatment Plan Summary: Daily contact with patient to assess and evaluate symptoms and progress in treatment Medication management Current Medications:  Current Facility-Administered Medications  Medication Dose Route Frequency Provider Last Rate Last Dose  . acetaminophen (TYLENOL) tablet 650 mg  650 mg Oral Q6H PRN Shuvon Rankin, NP      . alum & mag hydroxide-simeth (MAALOX/MYLANTA) 200-200-20 MG/5ML suspension 30 mL  30 mL Oral Q4H PRN Shuvon Rankin, NP      . ARIPiprazole (ABILIFY) tablet 5  mg  5 mg Oral QHS Shuvon Rankin, NP      . buPROPion (WELLBUTRIN XL) 24 hr tablet 150 mg  150 mg Oral Daily Shuvon Rankin, NP   150 mg at 06/25/12 0931  . escitalopram (LEXAPRO) tablet 20 mg  20 mg Oral Daily Shuvon Rankin, NP   20 mg at 06/25/12 0931  . magnesium hydroxide (MILK OF MAGNESIA) suspension 30 mL  30 mL Oral Daily PRN Shuvon Rankin, NP        Observation Level/Precautions:  15 minute checks  Laboratory:  Will see what Dr.Pittman recommends   Psychotherapy:    Medications:    Consultations: Dr. Raquel James re: hormonal therapy  [phone] in [place]   Discharge Concerns:    Estimated LOS: 3-4 days  Other:     I certify that inpatient services furnished can reasonably be expected to improve the patient's condition.   Lamara Brecht,MICKIE D.RPA-C CAQ-Psych  2/15/201412:56 PM

## 2012-06-25 NOTE — Clinical Social Work Note (Signed)
[facility] Group Notes:  (Clinical Social Work)  06/25/2012   3:00-4:00PM  Summary of Progress/Problems:   The main focus of today's process group was for the patient to identify ways in which they have in the past sabotaged their own recovery and to discuss their motivation to change.  Due to commonalities in the group, the discussion instead was switched to discuss Grief & Loss, what to expect, what is normal, and how to hold hope for the future.  The patient expressed that she had nothing to say today.  Type of Therapy:  Process Group  Participation Level:  Minimal  Participation Quality:  Attentive  Affect:  Blunted  Cognitive:  Oriented  Insight:  Limited  Engagement in Therapy:  Limited  Modes of Intervention:  Clarification, Support and Processing, Exploration, Discussion   Ambrose Mantle, LCSW 06/25/2012, 4:18 PM

## 2012-06-25 NOTE — Tx Team (Signed)
Initial Interdisciplinary Treatment Plan  PATIENT STRENGTHS: (choose at least two) Ability for insight Average or above average intelligence Capable of independent living Motivation for treatment/growth  PATIENT STRESSORS: Financial difficulties Health problems Substance abuse   PROBLEM LIST: Problem List/Patient Goals Date to be addressed Date deferred Reason deferred Estimated date of resolution  depression 06/24/2012     Suicide attempt 06/24/2012                                                DISCHARGE CRITERIA:  Ability to meet basic life and health needs Improved stabilization in mood, thinking, and/or behavior Medical problems require only outpatient monitoring Motivation to continue treatment in a less acute level of care  PRELIMINARY DISCHARGE PLAN: Attend PHP/IOP Outpatient therapy  PATIENT/FAMIILY INVOLVEMENT: This treatment plan has been presented to and reviewed with the patient, Phong Isenberg, and/or family member,  The patient and family have been given the opportunity to ask questions and make suggestions.  JEHU-APPIAH, Ixchel Duck K 06/25/2012, 1:40 AM

## 2012-06-25 NOTE — Progress Notes (Signed)
D: Patient reports sleep was fair last night, energy level low but increased ability to focus. Patient rates depression at 8/10 and hopelessness 7/10 (10 being worst). Patient reports cravings and chills and passive SI. She verbally contracts for safety. Patient also reports being lightheaded at times and off and on headache.  A: Patient encouraged to rise slowly, and to seek staff if assistance with pain needed.  R: Patient agreeable to suggestions. He did attend group but with minimal participation today. Joice Lofts RN MS EdS 06/25/2012  4:39 PM

## 2012-06-26 NOTE — Progress Notes (Signed)
D - Patient has isolated herself in her room for most of the day. Patient is going to the cafeteria for meals and is currently attended afternoon group. Patient rates depression "8", hopelessness "7", and anxiety "6" on the self inventory sheet. Patient continues to complain of SI but verbally contracts for safety. Patient denies HI and any auditory or visual hallucinations. Patient's discharge plans include following up with "[facility]" after discharge.  A - Patient offered encouragement and support through therapeutic conversation. Encouraged patient to actively attend group and speak with staff about any concerns or questions. Medications given as ordered.  R - Patient safety maintained with Q 15 minute checks. Patient remains safe on the unit.

## 2012-06-26 NOTE — Progress Notes (Signed)
Psychoeducational Group Note  Date:  06/26/2012 Time:  1015  Group Topic/Focus:  Making Healthy Choices:   The focus of this group is to help patients identify negative/unhealthy choices they were using prior to admission and identify positive/healthier coping strategies to replace them upon discharge.  Participation Level:  Minimal  Participation Quality:  Appropriate  Affect:  Flat  Cognitive:  Alert  Insight:  Limited  Engagement in Group:  Limited  Additional Comments:    Dione Housekeeper 06/26/2012

## 2012-06-26 NOTE — Progress Notes (Signed)
[facility] MD Progress Note  06/26/2012 12:25 PM Randy [name]  MRN:  [id number] Subjective:  Seen in room in bed. Still not accepting responsibility for self. Hasn't called any [employer]  Dr. Ruby Cola will need to be called for instructions about hormone therapy. Patient has not refilled scripts since 05/03/2012.   Diagnosis:  Axis I: Substance Abuse and Substance Induced Mood Disorder  ADL's:  Intact  Sleep: Fair  Appetite:  Fair  Suicidal Ideation:  Passive at times  Homicidal Ideation:  Denies  AEB (as evidenced by):  Psychiatric Specialty Exam: Review of Systems  Constitutional: Negative.   HENT: Negative.   Eyes: Negative.   Respiratory: Negative.   Gastrointestinal: Negative.   Genitourinary: Negative.   Musculoskeletal: Negative.   Skin: Negative.   Neurological: Negative.   Endo/Heme/Allergies: Negative.   Psychiatric/Behavioral: Positive for depression and substance abuse.    Blood pressure 111/79, pulse 83, temperature 98.1 F (36.7 C), temperature source Oral, resp. rate 17, height 5\' 6"  (1.676 m), weight 72.122 kg (159 lb).Body mass index is 25.68 kg/(m^2).  General Appearance: Fairly Groomed  Eye Contact:  Minimal  Speech:  Clear and Coherent and Normal Rate  Volume:  Normal  Mood:  Depressed and Irritable  Affect:  Constricted  Thought Process:  Goal Directed and Logical  Orientation:  Full (Time, Place, and Person)  Thought Content:  No AVH/psychosis   Suicidal Thoughts:  Yes.  without intent/plan  Homicidal Thoughts:  No  Memory:  Intact   Judgement:  Impaired  Insight:  Present  Psychomotor Activity:  Decreased  Concentration:  Fair  Recall:  Good  Akathisia:  No  Handed:  Right  AIMS (if indicated):     Assets:  Architect Physical Health Resilience  Sleep:  Number of Hours: 6.25   Current Medications: Current Facility-Administered Medications  Medication Dose Route Frequency Provider Last Rate  Last Dose  . acetaminophen (TYLENOL) tablet 650 mg  650 mg Oral Q6H PRN Shuvon Rankin, NP   650 mg at 06/25/12 1642  . alum & mag hydroxide-simeth (MAALOX/MYLANTA) 200-200-20 MG/5ML suspension 30 mL  30 mL Oral Q4H PRN Shuvon Rankin, NP      . ARIPiprazole (ABILIFY) tablet 5 mg  5 mg Oral QHS Shuvon Rankin, NP   5 mg at 06/25/12 2127  . buPROPion (WELLBUTRIN XL) 24 hr tablet 150 mg  150 mg Oral Daily Shuvon Rankin, NP   150 mg at 06/26/12 0814  . escitalopram (LEXAPRO) tablet 20 mg  20 mg Oral Daily Shuvon Rankin, NP   20 mg at 06/26/12 0815  . magnesium hydroxide (MILK OF MAGNESIA) suspension 30 mL  30 mL Oral Daily PRN Shuvon Rankin, NP      . nicotine (NICODERM CQ - dosed in mg/24 hours) patch 21 mg  21 mg Transdermal Daily Himabindu Ravi, MD   21 mg at 06/26/12 0815    Lab Results: No results found for this or any previous visit (from the past 48 hour(s)).  Physical Findings: AIMS: Facial and Oral Movements Muscles of Facial Expression: None, normal Lips and Perioral Area: None, normal Jaw: None, normal Tongue: None, normal,Extremity Movements Upper (arms, wrists, hands, fingers): None, normal Lower (legs, knees, ankles, toes): None, normal, Trunk Movements Neck, shoulders, hips: None, normal, Overall Severity Severity of abnormal movements (highest score from questions above): None, normal Incapacitation due to abnormal movements: None, normal Patient's awareness of abnormal movements (rate only patient's report): No Awareness, Dental Status Current problems with teeth and/or  dentures?: No Does patient usually wear dentures?: No  CIWA:    COWS:     Treatment Plan Summary: Daily contact with patient to assess and evaluate symptoms and progress in treatment Medication management  Plan: Restart hormones as indicated by Dr.Pittaway. Check with getting him into drug court.  Medical Decision Making Problem Points:  Established problem, worsening (2), New problem, with no  additional work-up planned (3), Review of last therapy session (1) and Review of psycho-social stressors (1) Data Points:  Decision to obtain old records (1) Discuss tests with performing physician (1) Independent review of image, tracing, or specimen (2) Review or order clinical lab tests (1) Review or order medicine tests (1) Review and summation of old records (2) Review of medication regiment & side effects (2) Review of new medications or change in dosage (2)  I certify that inpatient services furnished can reasonably be expected to improve the patient's condition.   Olamide Carattini,MICKIE D.RPA-C CAQ-Psych  06/26/2012, 12:25 PM

## 2012-06-26 NOTE — Clinical Social Work Note (Signed)
[facility] Group Notes:  (Clinical Social Work)  06/26/2012   3:00-4:00PM  Summary of Progress/Problems:   Summary of Progress/Problems:   The main focus of today's process group was to define "support" and describe what healthy supports are.  We then discussed how and why to increase patient supports, using motivational interviewing.  An emphasis was placed on using counselor, doctor, therapy groups, self-help groups and problem-specific support groups to expand supports.   The patient expressed willingness to reach out to others through Narcotics Anonymous specifically.  CSW encouraged her to consider going to AA or a different NA group if the first does not fit.  Also encouraged her to consider a depression support group in addition.  Type of Therapy:  Process Group  Participation Level:  Active  Participation Quality:  Attentive and Sharing  Affect:  Blunted  Cognitive:  Appropriate and Oriented  Insight:  Engaged  Engagement in Therapy:  Engaged  Modes of Intervention:  Education,  Teacher, English as a foreign language, Exploration, Discussion   Ambrose Mantle, LCSW 06/26/2012, 4:32 PM

## 2012-06-26 NOTE — Progress Notes (Signed)
Group Topic/Focus:  Gratefulness:  The focus of this group is to help patients identify what two things they are most grateful for in their lives. What helps ground them and to center them on their work to their recovery.  Participation Level:  Active  Participation Quality:  Appropriate  Affect:  Appropriate  Cognitive:  Appropriate  Insight:  Engaged  Engagement in Group:  Engaged  Additional Comments:    Khali Albanese A  

## 2012-06-27 NOTE — Progress Notes (Signed)
[facility] LCSW Group Therapy      Overcoming Obstacles 1:15 2:30 PM         06/27/2012 2:49 PM  Type of Therapy:  Group Therapy  Participation Level:  Active  Participation Quality:  Appropriate and Attentive  Affect:  Appropriate  Cognitive:  Alert and Appropriate  Insight:  Developing/Improving  Engagement in Therapy:  Developing/Improving  Modes of Intervention:  Discussion, Exploration, Problem-solving, Rapport Building and Support  Summary of Progress/Problems:  Patient stated the obstacle she needs to overcoming is craving for drugs.  She shared she wants to get clean "but" knows when she leaves the hospital she will use again.  Patient stated she is interested in [facility] and plans to contact attorney regarding extending court date.  Wynn Banker 06/27/2012, 2:49 PM

## 2012-06-27 NOTE — Progress Notes (Signed)
[facility] LCSW Aftercare Discharge Planning Group Note  06/27/2012 12:35 PM  Participation Quality:  Appropriate  Affect:  Appropriate, Depressed  Cognitive:  Appropriate  Insight:  Developing/Improving  Engagement in Group:  Developing/Improving  Modes of Intervention:  Exploration, Problem-solving, Rapport Building and Support  Summary of Progress/Problems:  Patient advised of admitting to hospital with SI.  She shared she has been abusing crack cocaine and heroin.  Patient continues to endorse SI but able to contract for safety.  Patient is uncertain of whether she wants to go into a residential program but will consider [facility] in [place].  Wynn Banker 06/27/2012, 12:35 PM

## 2012-06-27 NOTE — Progress Notes (Signed)
D:  Patient's self inventory sheet, patient sleeps fair, has good appetite, low energy level, improving attention span.  Rated depression $7, hopelessness #4.  Has experienced cravings.  SI off/on, contracts for safety.   Has experienced headache in past 24 hours.  "Dr. Elmyra Ricks needs to be contacted early in the day.  My prescriptions were waiting for me to pick them up at the pharmacy.  I need to seek help somewhere else if I am not allowed to take my hormones."  No discharge plans.  No problems taking medications after discharge. A:  Medications given per MD order.  Staff will monitor every 15 minutes for safety.   Emotional support and encouragement given to patient.  R:  Denied SI and HI.   Denied A/V hallucinations.  Patient remains safe on unit.

## 2012-06-27 NOTE — Tx Team (Signed)
Interdisciplinary Treatment Plan Update (Adult)  Date:  06/27/2012  Time Reviewed:  10:31 AM   Progress in Treatment: Attending groups:   Yes   Participating in groups:  Yes Taking medication as prescribed:  Yes Tolerating medication:  Yes Family/Significant othe contact made: Contact to be made with family Patient understands diagnosis:  Yes Discussing patient identified problems/goals with staff: Yes Medical problems stabilized or resolved: Yes Denies suicidal/homicidal ideation:No, but contracts for safety Issues/concerns per patient self-inventory:  Other:   New problem(s) identified:  Reason for Continuation of Hospitalization: Anxiety Depression Medication stabilization Suicidal ideation  Interventions implemented related to continuation of hospitalization:  Medication Management; safety checks q 15 mins  Additional comments:  Estimated length of stay:  2-3 days  Discharge Plan:  Home with outpatient follow up  New goal(s):  Review of initial/current patient goals per problem list:    1.  Goal(s): Eliminate SI/other thoughts of self harm (Patient will no longer endorse SI/HI or thoughts of self harm)   Met:  No  Target date: d/c  As evidenced by: Patiet endorses SI but contracts for safety.    2.  Goal (s):Reduce depression/anxiety (Paitent will rate symptoms at four or below)  Met: No   Target date: d/c  As evidenced by: Patient rates symptoms at seven and nine today    3.  Goal(s):.stabilize on meds (Patient will report being stabilized on medications - less symptomatic   Met:  No  Target date: d/c  As evidenced by: Patient continues to endorse symptoms    4.  Goal(s): Refer for outpatient follow up (Follow up appointment will be scheduled)   Met:  No  Target date: d/c  As evidenced by: Follow up appointment to be scheduled    Attendees: Patient:   06/27/2012 10:31 AM  Physican:  Patrick North, MD 06/27/2012 10:31 AM   Nursing:  Neill Loft, RN 06/27/2012 10:31 AM   Nursing:   Quintella Reichert, RN 06/27/2012 10:31 AM   Clinical Social Worker:  Juline Patch, LCSW 06/27/2012 10:31 AM   Other: Tera Helper, PHM-NP 06/27/2012 10:31 AM   Other:         06/27/2012 10:31 AM Other:        06/27/2012 10:31 AM

## 2012-06-27 NOTE — Progress Notes (Signed)
Patient received his scheduled medication and inquired as to when his estrogen medication would be started. Patient reports that he has changed doctors so his new doctor would need to be contacted. Patient has been up in the dayroom watching tv but no interaction with peer. He attended group this evening. Writer asked what patients plans were for discharge and patient reports that he is not sure yet. Patient reports passive si, denies hi/a/v hallucinations. Support and encouragement offered, safety maintained on unit, will continue to monitor.

## 2012-06-27 NOTE — Progress Notes (Signed)
I certify that inpatient services furnished can reasonably be expected to improve the patient's condition. Shad Ledvina, MD, MSPH  

## 2012-06-27 NOTE — Progress Notes (Signed)
Adult Psychoeducational Group Note  Date:  06/27/2012 Time:  8:00PM  Group Topic/Focus:  Wrap-Up Group:   The focus of this group is to help patients review their daily goal of treatment and discuss progress on daily workbooks.  Participation Level:  Minimal  Participation Quality:  Resistant  Affect:  Depressed and Flat  Cognitive:  Alert and Oriented  Insight: Appropriate  Engagement in Group:  Developing/Improving  Modes of Intervention:  Clarification, Exploration and Support  Additional Comments:  Pt rated her day between a 2 and 8. Pt stated that his day had some good parts and some awful parts. Pt stated that he learned from his previous groups about breathing exercises. Pt remembered how yoga also could be used as a coping strategy. Pt stated that she wants to go to a 90 day treatment center.   Morganna Styles, Randal Buba 06/27/2012, 9:28 PM

## 2012-06-27 NOTE — Progress Notes (Signed)
Adult Psychoeducational Group Note  Date:  06/27/2012 Time:  12:32 PM  Group Topic/Focus:   Three minute breathing exercise with CD. Group talked about their experience with relaxtion, meditation and breathing exercises. We did the activity and then talked about the experience. Pts able to talk about coping techniques that worked and ones that did not.  Participation Level:  Active  Participation Quality:  Sharing  Affect:  Depressed  Cognitive:  Alert  Insight: Good  Engagement in Group:  Engaged  Modes of Intervention:  Discussion, Education and Support  Additional Comments:  Pt indicated that she wants to be put back on her hormones so her goal for the day is to be back on hormones. Pt is upset that she was taken off and would like to seek help elsewhere if she does not get back on them.  Kinlee Garrison T 06/27/2012, 12:32 PM

## 2012-06-27 NOTE — Progress Notes (Signed)
[facility] MD Progress Note  06/27/2012 1:53 PM Randy [name]  MRN:  [id number] Subjective:  Patient reports doing better, detoxing without complications. She  would like to get started on her hormones.   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 Depression   . Hepatitis C   . 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   Eyes: Negative.   Respiratory: Negative.   Cardiovascular: Negative.   Gastrointestinal: Negative.   Genitourinary: Negative.   Musculoskeletal: Positive for myalgias.  Skin: Negative.   Neurological: Positive for weakness.  Endo/Heme/Allergies: Negative.   Psychiatric/Behavioral: Positive for depression.    Blood pressure 121/72, pulse 90, temperature 98.5 F (36.9 C), temperature source Oral, resp. rate 18, height 5\' 6"  (1.676 m), weight 72.122 kg (159 lb).Body mass index is 25.68 kg/(m^2).  General Appearance: Casual  Eye Contact::  Fair  Speech:  Clear and Coherent  Volume:  Normal  Mood:  Depressed and Dysphoric  Affect:  Constricted  Thought Process:  Coherent  Orientation:  Full (Time, Place, and Person)  Thought Content:  WDL  Suicidal Thoughts:  No  Homicidal Thoughts:  No  Memory:  Immediate;   Fair Recent;   Fair Remote;   Fair  Judgement:  Poor  Insight:  Present  Psychomotor Activity:  Decreased  Concentration:  Fair  Recall:  Fair  Akathisia:  No  Handed:  Right  AIMS (if indicated):     Assets:  Communication Skills Desire for Improvement  Sleep:  Number of Hours: 4.75   Current Medications: Current Facility-Administered Medications   Medication Dose Route Frequency Provider Last Rate Last Dose  . acetaminophen (TYLENOL) tablet 650 mg  650 mg Oral Q6H PRN Shuvon Rankin, NP   650 mg at 06/25/12 1642  . alum & mag hydroxide-simeth (MAALOX/MYLANTA) 200-200-20 MG/5ML suspension 30 mL  30 mL Oral Q4H PRN Shuvon Rankin, NP      . ARIPiprazole (ABILIFY) tablet 5 mg  5 mg Oral QHS Shuvon Rankin, NP   5 mg at 06/26/12 2111  . buPROPion (WELLBUTRIN XL) 24 hr tablet 150 mg  150 mg Oral Daily Shuvon Rankin, NP   150 mg at 06/27/12 0854  . magnesium hydroxide (MILK OF MAGNESIA) suspension 30 mL  30 mL Oral Daily PRN Shuvon Rankin, NP      . nicotine (NICODERM CQ - dosed in mg/24 hours) patch 21 mg  21 mg Transdermal Daily Hugh Kamara, MD   21 mg at 06/27/12 7829    Lab Results: No results found for this or any previous visit (from the past 48 hour(s)).  Physical Findings: AIMS: Facial and Oral Movements Muscles of Facial Expression: None, normal Lips and Perioral Area: None, normal Jaw: None, normal Tongue: None, normal,Extremity Movements Upper (arms, wrists, hands, fingers): None, normal Lower (legs, knees, ankles, toes): None, normal, Trunk Movements Neck, shoulders, hips: None, normal, Overall Severity Severity of abnormal movements (highest score from questions above): None, normal Incapacitation due to abnormal movements: None, normal Patient's awareness of abnormal movements (rate only patient's report): No Awareness, Dental Status Current problems with teeth and/or  dentures?: No Does patient usually wear dentures?: No  CIWA:    COWS:     Treatment Plan Summary: Daily contact with patient to assess and evaluate symptoms and progress in treatment Medication management  Plan: Discontine Lexapro, patient has been non compliant and was started on wellbutrin as well. Continue to monitor. Patient states she will return to her motel once discharged.  Medical Decision Making Problem Points:  Established problem,  stable/improving (1), Review of last therapy session (1) and Review of psycho-social stressors (1) Data Points:  Review of medication regiment & side effects (2) Review of new medications or change in dosage (2)  I certify that inpatient services furnished can reasonably be expected to improve the patient's condition.   Randy [name] 06/27/2012, 1:53 PM

## 2012-06-27 NOTE — Progress Notes (Signed)
Nutrition Brief Note  Patient identified on the Malnutrition Screening Tool (MST) Report  Body mass index is 25.68 kg/(m^2). Patient meets criteria for WNL based on current BMI.   Patient reports poor intake for the past 3 months secondary to cocaine abuse but increased weight from 145 lbs in October per patient.  Discussed importance of good nutrition and avoidance of drugs.  Current diet order is regular, patient is consuming approximately 100% of meals at this time. Labs and medications reviewed.   No nutrition interventions warranted at this time. If nutrition issues arise, please consult RD.  Patient denies needs at this time.  Oran Rein, RD, LDN Clinical Inpatient Dietitian Pager:  [phone] Weekend and after hours pager:  [phone]

## 2012-06-28 MED ORDER — ESCITALOPRAM OXALATE 20 MG PO TABS
20.0000 mg | ORAL_TABLET | Freq: Every day | ORAL | Status: DC
  Administered 2012-06-28 – 2012-06-30 (×3): 20 mg via ORAL
  Filled 2012-06-28 (×2): qty 1
  Filled 2012-06-28: qty 3
  Filled 2012-06-28 (×3): qty 1

## 2012-06-28 MED ORDER — TUBERCULIN PPD 5 UNIT/0.1ML ID SOLN
5.0000 [IU] | Freq: Once | INTRADERMAL | Status: AC
  Administered 2012-06-28: 5 [IU] via INTRADERMAL

## 2012-06-28 MED ORDER — SPIRONOLACTONE 25 MG PO TABS
50.0000 mg | ORAL_TABLET | Freq: Every day | ORAL | Status: DC
  Administered 2012-06-28 – 2012-06-30 (×3): 50 mg via ORAL
  Filled 2012-06-28: qty 6
  Filled 2012-06-28 (×4): qty 1

## 2012-06-28 MED ORDER — ESTRADIOL 1 MG PO TABS
2.0000 mg | ORAL_TABLET | Freq: Three times a day (TID) | ORAL | Status: DC
  Administered 2012-06-28 – 2012-06-30 (×7): 2 mg via ORAL
  Filled 2012-06-28: qty 18
  Filled 2012-06-28 (×9): qty 1
  Filled 2012-06-28 (×2): qty 18

## 2012-06-28 NOTE — Progress Notes (Signed)
[facility] MD Progress Note  06/28/2012 10:40 AM Randy [name]  MRN:  [id number] Subjective:  Patient reports feeling better but can tell she is not on her hormones. Denies problems with her detox. She also reports today that she has been compliant with all her medications and would like to be restarted on the Lexapro.  Diagnosis:   Axis I: Substance Induced Mood Disorder, Polysubstance abuse Axis II: Deferred Axis III:  Past Medical History  Diagnosis Date  . Depression   . Hepatitis C   . 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 Cellulitis  Of  Left Forearm 01/10/2012    From IVDA   Axis IV: other psychosocial or environmental problems Axis V: 41-50 serious symptoms  ADL's:  Intact  Sleep: Fair  Appetite:  Fair   Psychiatric Specialty Exam: Review of Systems  Constitutional: Negative.   HENT: Negative.   Eyes: Negative.   Respiratory: Negative.   Cardiovascular: Negative.   Gastrointestinal: Negative.   Genitourinary: Negative.   Musculoskeletal: Negative.   Skin: Negative.   Neurological: Negative.   Endo/Heme/Allergies: Negative.   Psychiatric/Behavioral: Positive for depression. The patient is nervous/anxious and has insomnia.     Blood pressure 128/80, pulse 86, temperature 97.9 F (36.6 C), temperature source Oral, resp. rate 16, height 5\' 6"  (1.676 m), weight 72.122 kg (159 lb).Body mass index is 25.68 kg/(m^2).  General Appearance: Disheveled  Eye Solicitor::  Fair  Speech:  Clear and Coherent  Volume:  Normal  Mood:  Anxious and Depressed  Affect:  Constricted and Depressed  Thought Process:  Coherent  Orientation:  Full (Time, Place, and Person)  Thought Content:  Rumination  Suicidal Thoughts:  No  Homicidal Thoughts:  No  Memory:  Immediate;   Fair Recent;   Fair Remote;   Fair  Judgement:  Fair  Insight:  Fair  Psychomotor Activity:  Decreased  Concentration:  Fair  Recall:  Fair  Akathisia:  No  Handed:  Right  AIMS (if indicated):      Assets:  Communication Skills Desire for Improvement Social Support  Sleep:  Number of Hours: 4.5   Current Medications: Current Facility-Administered Medications  Medication Dose Route Frequency Provider Last Rate Last Dose  . acetaminophen (TYLENOL) tablet 650 mg  650 mg Oral Q6H PRN Shuvon Rankin, NP   650 mg at 06/25/12 1642  . alum & mag hydroxide-simeth (MAALOX/MYLANTA) 200-200-20 MG/5ML suspension 30 mL  30 mL Oral Q4H PRN Shuvon Rankin, NP      . ARIPiprazole (ABILIFY) tablet 5 mg  5 mg Oral QHS Shuvon Rankin, NP   5 mg at 06/27/12 2203  . buPROPion (WELLBUTRIN XL) 24 hr tablet 150 mg  150 mg Oral Daily Shuvon Rankin, NP   150 mg at 06/28/12 4098  . escitalopram (LEXAPRO) tablet 20 mg  20 mg Oral Daily Malasha Kleppe, MD      . estradiol (ESTRACE) tablet 2 mg  2 mg Oral TID Etha Stambaugh, MD      . magnesium hydroxide (MILK OF MAGNESIA) suspension 30 mL  30 mL Oral Daily PRN Shuvon Rankin, NP      . nicotine (NICODERM CQ - dosed in mg/24 hours) patch 21 mg  21 mg Transdermal Daily Reata Petrov, MD   21 mg at 06/28/12 1191  . spironolactone (ALDACTONE) tablet 50 mg  50 mg Oral Daily Deloris Mittag, MD        Lab Results: No results found for this or any previous visit (from the past  48 hour(s)).  Physical Findings: AIMS: Facial and Oral Movements Muscles of Facial Expression: None, normal Lips and Perioral Area: None, normal Jaw: None, normal Tongue: None, normal,Extremity Movements Upper (arms, wrists, hands, fingers): None, normal Lower (legs, knees, ankles, toes): None, normal, Trunk Movements Neck, shoulders, hips: None, normal, Overall Severity Severity of abnormal movements (highest score from questions above): None, normal Incapacitation due to abnormal movements: None, normal Patient's awareness of abnormal movements (rate only patient's report): No Awareness, Dental Status Current problems with teeth and/or dentures?: No Does patient usually wear dentures?: No   CIWA:  CIWA-Ar Total: 0 COWS:  COWS Total Score: 0  Treatment Plan Summary: Daily contact with patient to assess and evaluate symptoms and progress in treatment Medication management  Plan: Restart Lexapro at 20mg  po qd. Restart hormones. Discussed with patients the medical consequences of combining illegal drugs and her medications. Plan for discharge by the end of the week.  Medical Decision Making Problem Points:  Established problem, stable/improving (1), Review of last therapy session (1) and Review of psycho-social stressors (1) Data Points:  Review of medication regiment & side effects (2) Review of new medications or change in dosage (2)  I certify that inpatient services furnished can reasonably be expected to improve the patient's condition.   Myking Sar 06/28/2012, 10:40 AM

## 2012-06-28 NOTE — Progress Notes (Signed)
Writer spoke with patient 1:1 and she reports doing better. Patient reports that her estrogen medication has been restarted and she is happy with that. Writer asked patients plan upon discharge and she reports she is going to [facility] and is ready to stop using. Patient reports that being here in [place] makes it very easy to get access to drugs. She reports that her partner is not getting out of jail for another 2 weeks and they need to separate for a while and get their lives together. She also plans on working on her masters. Writer provided support and encouragement, patient reports passive si and verbally contracts, denies hi/a/v hallucinations. Safety maintained on unit, will continue to monitor.

## 2012-06-28 NOTE — Progress Notes (Signed)
Grief and Loss Group  Patients discussed experiences of grief and loss in their lives.  Patients were able to explore coping skills, reactions to grief, and resiliency.  Pt was present in group and attentive.  Pt had to leave for meeting with nurse, but returned at the end of group.  Whitney P Esmond Camper Counseling Intern

## 2012-06-28 NOTE — Progress Notes (Signed)
D: Pt stated had "up and down day".  Voiced anxiety regarding hormone therapy being withheld, but acknowledges having been inconsistent in taking in during past couple of months.  Visible on unit during evening shift.  Eye contact is sustained with animated but anxious facial expression.  Mood/affect are depressed/anxious/sad.  Speech is logical and coherent; no overt evidence of disorganized thought process or content.  Pt endorses intermittent SI with no plan; is contracting for safety on unit.  Denies HI, AVH, and acute pain.  Requesting to be retested for HIV due to unprotected intercourse during past several months.  A: Provided verbal support and encouragement.  No PRNs given. All medications administered according to med orders and POC.  Q15 minute safety checks maintained as per unit protocol.  R: Safety maintained. Dion Saucier RN

## 2012-06-28 NOTE — Progress Notes (Signed)
[facility] LCSW Group Therapy       Feelings About Diagnosis 1:15 - 2:30 PM      06/28/2012 4:14 PM  Type of Therapy:  Group Therapy  Participation Level:  Active  Participation Quality:  Appropriate and Attentive  Affect:  Appropriate  Cognitive:  Alert and Appropriate  Insight:  Engaged  Engagement in Therapy:  Engaged  Modes of Intervention:  Discussion, Exploration, Problem-solving, Rapport Building and Support  Summary of Progress/Problems:  Patient states it is hard for her to accept that she has added substance dependence to her diagnosis of depression.  She shared he drugs had initially help her to deal with problems but now she has addiction and legal problems.  Patient shared she is looking forward to going into treatment and become and ex-drug user. Wynn Banker 06/28/2012, 4:14 PM

## 2012-06-28 NOTE — Progress Notes (Signed)
Patient ID: Randy [name], male   DOB: May 14, 1970, 42 y.o.   MRN: [id number] D- Patient reports fair sleep and improving appetite.  Her energy level is low and her ability to pay attention is improving.  Her depression is 6/10 .  Her hopelessness is 7/10.  She is having cravings.  She has been eager to see MD to have hormones restarted.  A- Seen by MD.  R- Patient interacting with patients and staff.

## 2012-06-28 NOTE — Progress Notes (Signed)
[facility] LCSW Aftercare Discharge Planning Group Note  06/28/2012 11:04 AM  Participation Quality:  Appropriate  Affect:  Appropriate, Depressed  Cognitive:  Appropriate  Insight:  Engaged Engagement in Group: Engaged  Modes of Intervention:  Exploration, Problem-solving, Rapport Building and Support  Summary of Progress/Problems:  Patient reports doing better today and denies SI.  She rates depression and anxiety at five.  She advised of interest in going into residential treatment at [facility] in [place].  Writer to make referral as instructed.Daily workbook provided.   Wynn Banker 06/28/2012, 11:04 AM

## 2012-06-29 DIAGNOSIS — F332 Major depressive disorder, recurrent severe without psychotic features: Principal | ICD-10-CM

## 2012-06-29 MED ORDER — FINASTERIDE 5 MG PO TABS
5.0000 mg | ORAL_TABLET | Freq: Every day | ORAL | Status: DC
  Administered 2012-06-29 – 2012-06-30 (×2): 5 mg via ORAL
  Filled 2012-06-29: qty 1
  Filled 2012-06-29: qty 3
  Filled 2012-06-29 (×2): qty 1

## 2012-06-29 MED ORDER — AMANTADINE HCL 100 MG PO CAPS
100.0000 mg | ORAL_CAPSULE | Freq: Two times a day (BID) | ORAL | Status: DC
  Administered 2012-06-29 – 2012-06-30 (×2): 100 mg via ORAL
  Filled 2012-06-29 (×2): qty 6
  Filled 2012-06-29 (×5): qty 1

## 2012-06-29 NOTE — Clinical Social Work Note (Signed)
Referral made to [facility] in [place].  Writer spoke with Randy [name] who advised she will call back after clinicals have been reviewed.

## 2012-06-29 NOTE — Progress Notes (Signed)
[facility] LCSW Aftercare Discharge Planning Group Note  06/29/2012 12:08 PM  Participation Quality:  Appropriate  Affect:  Appropriate, Depressed  Cognitive:  Appropriate  Insight:  Engaged Engagement in Group: Engaged  Modes of Intervention:  Exploration, Problem-solving, Rapport Building and Support  Summary of Progress/Problems:  Patient endorsing off/on SI today but contracts for safety.  She rates depression at four/five and anxiety at four.  Patient asked that writer follow up with [facility] on whether he can bring a cell phone, lap top and travel by train.  Writer to follow up on patient's questions.  Daily workbook provided.   Wynn Banker 06/29/2012, 12:08 PM

## 2012-06-29 NOTE — Progress Notes (Signed)
[facility] INPATIENT:  Family/Significant Other Suicide Prevention Education  Suicide Prevention Education:  Patient Refusal for Family/Significant Other Suicide Prevention Education: The patient Randy [name] has refused to provide written consent for family/significant other to be provided Family/Significant Other Suicide Prevention Education during admission and/or prior to discharge.  Physician notified.  Patient advised of no family involvement.  Wynn Banker 06/29/2012, 12:48 PM

## 2012-06-29 NOTE — Progress Notes (Signed)
D: Patient cooperative with staff. Patient's affect/mood is appropriate to circumstance. She reported on the self inventory sheet that her appetite is improving, sleep is fair, energy level is low and ability to pay attention is improving. Patient rated depression "5" and feelings of hopelessness "3".  A: Support and encouragement provided to patient. Administered scheduled medications per ordering MD. Monitor Q15 minute checks for safety.  R: Patient receptive. Denies SI/HI/AVH. Patient remains safe on the unit.

## 2012-06-29 NOTE — Progress Notes (Signed)
Adult Psychoeducational Group Note  Date:  06/29/2012 Time:  11:45 AM  Group Topic/Focus:  Coping With Mental Health Crisis:   The purpose of this group is to help patients identify strategies for coping with mental health crisis.  Group discusses possible causes of crisis and ways to manage them effectively. Crisis Planning:   The purpose of this group is to help patients create a crisis plan for use upon discharge or in the future, as needed.  Participation Level:  Active  Participation Quality:  Attentive  Affect:  Blunted  Cognitive:  Alert  Insight: Good  Engagement in Group:  Engaged  Modes of Intervention:  Discussion and Education  Additional Comments:  Pt talked about being ready for discharge to a 90 day program. Pt came in to deal with medication adjustment and substance abuse issues.  Trevyon Swor T 06/29/2012, 11:45 AM

## 2012-06-29 NOTE — Progress Notes (Signed)
[facility] LCSW Group Therapy      Emotional Regulation 1:15 - 2:30 PM      06/29/2012 3:37 PM  Type of Therapy:  Group Therapy  Participation Level:   Minimal  Participation Quality:  Appropriate and Attentive  Affect:  Appropriate  Cognitive:  Alert and Appropriate  Insight:  Limited  Engagement in Therapy:  Limited  Modes of Intervention:  Discussion, Exploration, Problem-solving, Rapport Building and Support  Summary of Progress/Problems:  Patient unable to identify and situation that would cause her to have difficulty controlling emotions.  Patient was able to state she just needs to stay focused on reason for getting sober and clean.  She stated the reason is for herself.   Wynn Banker 06/29/2012, 3:37 PM

## 2012-06-29 NOTE — Progress Notes (Signed)
[facility] Group Notes:  (Nursing/MHT/Case Management/Adjunct)  Date:  06/29/2012  Time:  1:23 AM  Type of Therapy:  Wrap up group  Participation Level:  Active  Participation Quality:  Appropriate and Attentive  Affect:  Appropriate  Cognitive:  Appropriate  Insight:  Appropriate  Engagement in Group:  Engaged  Modes of Intervention:  Discussion and Support  Summary of Progress/Problems: The purpose of this group was to get an idea of how each patients day was and explore any problems or concerns that they may have.  When asked how patients day was she explained that she had a good day today.  When asked what made her day a good day she explained how she was looking forward to going to a long term treatment facility in [place] and also that her partner was not looking at facing as much jail time as she previously thought.  Tomi Bamberger Coursey 06/29/2012, 1:23 AM

## 2012-06-29 NOTE — Progress Notes (Signed)
[facility] MD Progress Note  06/29/2012 3:18 PM Randy [name]  MRN:  [id number] Subjective:  5/10 depression and anxiety, feels like he has detoxed from cocaine but has cravings.  Patient was lying in her bed and stated her lower back was sore but contributed it to the bed.  She stated her sleep was fair but feels tired.  Levonte remembered her third medication for hormone therapy and order placed--Proscar 5 mg daily.  She engaged easily about her transgender process, especially when it was discovered we knew the same MD who went through the process.  Diagnosis:   Axis I: Major Depression, Recurrent severe and Substance Abuse Axis II: Deferred Axis III:  Past Medical History  Diagnosis Date  . Depression   . Hepatitis C   . 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 Cellulitis  Of  Left Forearm 01/10/2012    From IVDA   Axis IV: economic problems, occupational problems, other psychosocial or environmental problems, problems related to social environment and problems with primary support group Axis V: 41-50 serious symptoms  ADL's:  Intact  Sleep: Fair  Appetite:  Fair  Suicidal Ideation:  Denies Homicidal Ideation:  Denies  Psychiatric Specialty Exam: Review of Systems  Constitutional: Negative.   HENT: Negative.   Eyes: Negative.   Respiratory: Negative.   Cardiovascular: Negative.   Gastrointestinal: Negative.   Genitourinary: Negative.   Musculoskeletal: Positive for back pain.  Skin: Negative.   Neurological: Negative.   Endo/Heme/Allergies: Negative.   Psychiatric/Behavioral: Positive for depression and substance abuse. The patient is nervous/anxious.     Blood pressure 124/77, pulse 83, temperature 97.8 F (36.6 C), temperature source Oral, resp. rate 16, height 5\' 6"  (1.676 m), weight 72.122 kg (159 lb).Body mass index is 25.68 kg/(m^2).  General Appearance: Casual  Eye Contact::  Fair  Speech:  Slow  Volume:  Decreased  Mood:  Anxious and Depressed  Affect:   Depressed  Thought Process:  Coherent  Orientation:  Full (Time, Place, and Person)  Thought Content:  WDL  Suicidal Thoughts:  No  Homicidal Thoughts:  No  Memory:  Immediate;   Fair Recent;   Fair Remote;   Fair  Judgement:  Fair  Insight:  Fair  Psychomotor Activity:  Decreased  Concentration:  Fair  Recall:  Fair  Akathisia:  No  Handed:  Right  AIMS (if indicated):     Assets:  Communication Skills Desire for Improvement  Sleep:  Number of Hours: 6   Current Medications: Current Facility-Administered Medications  Medication Dose Route Frequency Provider Last Rate Last Dose  . acetaminophen (TYLENOL) tablet 650 mg  650 mg Oral Q6H PRN Shuvon Rankin, NP   650 mg at 06/25/12 1642  . alum & mag hydroxide-simeth (MAALOX/MYLANTA) 200-200-20 MG/5ML suspension 30 mL  30 mL Oral Q4H PRN Shuvon Rankin, NP      . ARIPiprazole (ABILIFY) tablet 5 mg  5 mg Oral QHS Shuvon Rankin, NP   5 mg at 06/28/12 2058  . buPROPion (WELLBUTRIN XL) 24 hr tablet 150 mg  150 mg Oral Daily Shuvon Rankin, NP   150 mg at 06/29/12 0755  . escitalopram (LEXAPRO) tablet 20 mg  20 mg Oral Daily Himabindu Ravi, MD   20 mg at 06/29/12 0755  . estradiol (ESTRACE) tablet 2 mg  2 mg Oral TID Himabindu Ravi, MD   2 mg at 06/29/12 1202  . finasteride (PROSCAR) tablet 5 mg  5 mg Oral Daily Nanine Means, NP      .  magnesium hydroxide (MILK OF MAGNESIA) suspension 30 mL  30 mL Oral Daily PRN Shuvon Rankin, NP      . nicotine (NICODERM CQ - dosed in mg/24 hours) patch 21 mg  21 mg Transdermal Daily Himabindu Ravi, MD   21 mg at 06/29/12 0608  . spironolactone (ALDACTONE) tablet 50 mg  50 mg Oral Daily Himabindu Ravi, MD   50 mg at 06/29/12 0755  . tuberculin injection 5 Units  5 Units Intradermal Once Sanjuana Kava, NP   5 Units at 06/28/12 1211    Lab Results: No results found for this or any previous visit (from the past 48 hour(s)).  Physical Findings: AIMS: Facial and Oral Movements Muscles of Facial Expression:  None, normal Lips and Perioral Area: None, normal Jaw: None, normal Tongue: None, normal,Extremity Movements Upper (arms, wrists, hands, fingers): None, normal Lower (legs, knees, ankles, toes): None, normal, Trunk Movements Neck, shoulders, hips: None, normal, Overall Severity Severity of abnormal movements (highest score from questions above): None, normal Incapacitation due to abnormal movements: None, normal Patient's awareness of abnormal movements (rate only patient's report): No Awareness, Dental Status Current problems with teeth and/or dentures?: No Does patient usually wear dentures?: No  CIWA:  CIWA-Ar Total: 0 COWS:  COWS Total Score: 0  Treatment Plan Summary: Daily contact with patient to assess and evaluate symptoms and progress in treatment Medication management  Plan:  Review of chart, vital signs, medications, and notes. 1-Individual and group therapy 2-Medication management for depression and anxiety:  Medications reviewed with the patient and she stated no untoward effects 3-Coping skills for depression, anxiety, and back pain--ibuprofen ordered; discussed her transgender process and encouraged her to continue to attend groups 4-Continue crisis stabilization and management 5-Address health issues--vital signs stable, patient remembered his third medication in her hormone replacement therapy--Proscar--ordered for her 6-Treatment plan in progress to prevent relapse of depression and anxiety  Medical Decision Making Problem Points:  Established problem, stable/improving (1) and Review of psycho-social stressors (1) Data Points:  Review of new medications or change in dosage (2)  I certify that inpatient services furnished can reasonably be expected to improve the patient's condition.   Nanine Means, PMH-NP 06/29/2012, 3:18 PM

## 2012-06-29 NOTE — Progress Notes (Signed)
Recreation Therapy Notes  Date: 02.19.2014  Time: 3:00pm   Group Topic/Focus: Communication   Participation Level:  Did not attend    Makenzi Bannister L Koty Anctil, LRT/CTRS  

## 2012-06-29 NOTE — Progress Notes (Signed)
[facility] Group Notes:  (Nursing/MHT/Case Management/Adjunct)  Type of Therapy:  Psychoeducational Skills  Participation Level:  Minimal  Participation Quality:  Appropriate and Attentive  Affect:  Blunted, Depressed and Gaurded  Cognitive:  Appropriate and Oriented  Insight:  Good  Engagement in Group:  Engaged  Modes of Intervention:  Activity, Discussion, Education, Problem-solving, Rapport Building, Socialization and Support  Summary of Progress/Problems: Manav attended psychoeducational group on labels. 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 Jacere was quiet but attentive while group discussed what labels are, how we use them, how they affect the way we think about and perceive the world, and listed positive and negative labels they have used or been called. Cornelio did not speak but began to smile an dshake her head (up and down as to show support) when group discussed that it is important to define yourself as you would like to be defined as, (such as life roles, names, nicknames, gender etc). Tanvir was given homework assignment to list 10 words she has been labeled and to find the reality of the situation/label.    [name] 06/29/2012 5:35 PM

## 2012-06-29 NOTE — Progress Notes (Signed)
Patient ID: Randy [name], male   DOB: 12-27-1970, 42 y.o.   MRN: [id number] D: Pt presented with depressed mood and flat affect. Pt continues to isolate herself from peers and retrieve to her room. Pt denies SI/HI/AVH and pain. Pt attended evening wrap up group and Interacted appropriately with peers. Cooperative with assessment. No acute distressed noted at this time.   A: Met with pt 1:1. Medications administered as prescribed. Writer encouraged pt to discuss feelings. Pt encouraged to come to staff with any question or concerns.  R: Patient remains safe. She is complaint with medications and group programming. Continue current POC.

## 2012-06-30 DIAGNOSIS — F411 Generalized anxiety disorder: Secondary | ICD-10-CM

## 2012-06-30 MED ORDER — FINASTERIDE 5 MG PO TABS: 5.0000 mg | ORAL_TABLET | Freq: Every day | ORAL | Status: DC

## 2012-06-30 MED ORDER — ESCITALOPRAM OXALATE 20 MG PO TABS: 20.0000 mg | ORAL_TABLET | Freq: Every day | ORAL | Status: DC

## 2012-06-30 MED ORDER — ARIPIPRAZOLE 5 MG PO TABS: 5.0000 mg | ORAL_TABLET | Freq: Every day | ORAL | Status: DC

## 2012-06-30 MED ORDER — AMANTADINE HCL 100 MG PO CAPS: 100.0000 mg | ORAL_CAPSULE | Freq: Two times a day (BID) | ORAL | Status: DC

## 2012-06-30 MED ORDER — ESTRADIOL 2 MG PO TABS: 2.0000 mg | ORAL_TABLET | Freq: Three times a day (TID) | ORAL | Status: DC

## 2012-06-30 MED ORDER — SPIRONOLACTONE 50 MG PO TABS: 50.0000 mg | ORAL_TABLET | Freq: Every day | ORAL | Status: DC

## 2012-06-30 MED ORDER — BUPROPION HCL ER (XL) 150 MG PO TB24: 150.0000 mg | ORAL_TABLET | Freq: Every day | ORAL | Status: DC

## 2012-06-30 NOTE — [facility] Suicide Risk Assessment (Signed)
Suicide Risk Assessment  Discharge Assessment     Demographic Factors:  Male (transgender), Caucasian, single  Mental Status Per Nursing Assessment::   On Admission:  NA  Current Mental Status by Physician: Patient alert and oriented to 4. Denies SI/HI/VH/AH.  Loss Factors: Loss of significant relationship and Financial problems/change in socioeconomic status  Historical Factors: Impulsivity  Risk Reduction Factors:   Positive coping skills or problem solving skills  Continued Clinical Symptoms:  Alcohol/Substance Abuse/Dependencies  Cognitive Features That Contribute To Risk:  Cognitively intact  Suicide Risk:  Minimal: No identifiable suicidal ideation.  Patients presenting with no risk factors but with morbid ruminations; may be classified as minimal risk based on the severity of the depressive symptoms  Discharge Diagnoses:   AXIS I:  Major Depression, Recurrent severe, Substance Abuse and Substance Induced Mood Disorder AXIS II:  Deferred AXIS III:   Past Medical History  Diagnosis Date  . Depression   . Hepatitis C   . 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 Cellulitis  Of  Left Forearm 01/10/2012    From IVDA   AXIS IV:  economic problems and other psychosocial or environmental problems AXIS V:  61-70 mild symptoms  Plan Of Care/Follow-up recommendations:  Activity:  As tolerated Diet:  As tolerated Follow up with appointments.  Is patient on multiple antipsychotic therapies at discharge:  No   Has Patient had three or more failed trials of antipsychotic monotherapy by history:  No  Recommended Plan for Multiple Antipsychotic Therapies: NA  Nahomy Limburg 06/30/2012, 9:23 AM

## 2012-06-30 NOTE — Clinical Social Work Note (Signed)
Writer received a call from [place] at [facility].  She advised she spoke with Director regarding patient's transgender status and that they have no problems admitting her for treatment.  She advised if patient has not had surgery, she would have to be housed with the male population and that they do not have any single rooms, therefore, patient will have to share a room with a male.  Writer met with patient to advise of the situation.  Patient stated she is very flexible and would have no problem being housed in male quarters and sharing a room with another male.  She advised she has not had surgery to complete her gender reassignment.  MD advised of situation.  Morrie Sheldon advised of possible bed availability tomorrow.

## 2012-06-30 NOTE — Care Management Utilization Note (Signed)
PER STATE REGULATIONS 482.30  THIS CHART WAS REVIEWED FOR MEDICAL NECESSITY WITH RESPECT TO THE PATIENT'S ADMISSION/ DURATION OF STAY.  NEXT REVIEW DATE: 07/03/2012

## 2012-06-30 NOTE — Progress Notes (Addendum)
[facility] LCSW Aftercare Discharge Planning Group Note  06/30/2012 11:20 AM  Participation Quality:  Appropriate  Affect:  Appropriate  Cognitive:  Appropriate  Insight:  Engaged  Engagement in Group: Engaged  Modes of Intervention:  Exploration, Problem-solving, Rapport Building and Support  Summary of Progress/Problems:  Patient reports doing well today. She endorse SI but not intent and reports being ready to discharge home today.  She rates depression at four and anxiety at four/five.  Patient is hopeful to be accepted to [facility] for residential treatment.  Daily workbook provided.   Wynn Banker 06/30/2012, 11:20 AM

## 2012-06-30 NOTE — Progress Notes (Signed)
D: Pt denies SI,HI, & AVH. A: Pt given all d/c instructions ,belongings & med samples.R: Pt verbalized understanding of all d/c instructions. Supported & encouraged.D/C,d Home.

## 2012-06-30 NOTE — Progress Notes (Signed)
Reviewed

## 2012-06-30 NOTE — Discharge Summary (Signed)
Reviewed

## 2012-06-30 NOTE — Discharge Summary (Signed)
Physician Discharge Summary Note  Patient:  Randy [name] is an 42 y.o., male MRN:  [id number] DOB:  [birth year] Patient phone:  [phone] (home)  Patient address:   [address] [place] [address],   Date of Admission:  06/24/2012 Date of Discharge: 06/30/2012  Reason for Admission:  Depression with suicidal thoughts (see details under hospital course)  Discharge Diagnoses: Principal Problem:   Polysubstance abuse including IVDA (heroin), cocaine, marijuana Active Problems:   Recurrent major depression-severe   Anxiety disorder   Tobacco abuse   Major depression, recurrent   Cocaine dependence  Review of Systems  Constitutional: Negative.   HENT: Negative.   Eyes: Negative.   Respiratory: Negative.   Cardiovascular: Negative.   Gastrointestinal: Negative.   Genitourinary: Negative.   Musculoskeletal: Negative.   Skin: Negative.   Neurological: Negative.   Endo/Heme/Allergies: Negative.   Psychiatric/Behavioral: Positive for depression and substance abuse. The patient is nervous/anxious.    Axis Diagnosis:   AXIS I:  Generalized Anxiety Disorder, Major Depression, Recurrent severe and Substance Abuse AXIS II:  Deferred AXIS III:   Past Medical History  Diagnosis Date  . Depression   . Hepatitis C   . 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 Cellulitis  Of  Left Forearm 01/10/2012    From IVDA   AXIS IV:  economic problems, housing problems, occupational problems, other psychosocial or environmental problems, problems related to legal system/crime and problems with primary support group AXIS V:  61-70 mild symptoms  Level of Care:  OP  Hospital Course:  Patient was last here 11/30- 04/14/2012 for same complaints of worsening depression with suicidal thoughts.She states never got it together after last discharge. She is spending the disability checks on drugs and hotels. The partner was due to be released from prison 2/13 but due to snow  release was delayed. Has court 2/28 for charges related to cocaine possession drug paraphernalia and maintaining a dwelling.  [name] has attended groups and participated.  She hopes to continue her recovery at [facility] in [place] for [facility] days.  Her hormone therapy was re-established; estradiol, Proscar--aldactone for swelling.  Wellbutrin, ability, and Lexapro for her depression.  Symmetrel ordered for her cocaine cravings after consulting with Dr Dub Mikes.  She requested a HIV test--results were non-reactive; safe sex and bleaching needles education provided to the patient.  Aldrich's depression and anxiety have decreased; no suicidal or homicidal ideations; no auditory or visual hallucinations.  She is mentally and physically stable for discharge; Rx given with a 3 day supply of medications (has Medicare).   Consults:  None  Significant Diagnostic Studies:  labs: Completed and reviewed, stable  Discharge Vitals:   Blood pressure 124/77, pulse 83, temperature 97.8 F (36.6 C), temperature source Oral, resp. rate 16, height 5\' 6"  (1.676 m), weight 72.122 kg (159 lb). Body mass index is 25.68 kg/(m^2). Lab Results:   Results for orders placed during the hospital encounter of 06/24/12 (from the past 72 hour(s))  HIV ANTIBODY (ROUTINE TESTING)     Status: None   Collection Time    06/29/12  8:10 PM      Result Value Range   HIV NON REACTIVE  NON REACTIVE    Physical Findings: AIMS: Facial and Oral Movements Muscles of Facial Expression: None, normal Lips and Perioral Area: None, normal Jaw: None, normal Tongue: None, normal,Extremity Movements Upper (arms, wrists, hands, fingers): None, normal Lower (legs, knees, ankles, toes): None, normal, Trunk Movements Neck, shoulders, hips: None, normal,  Overall Severity Severity of abnormal movements (highest score from questions above): None, normal Incapacitation due to abnormal movements: None, normal Patient's awareness of abnormal movements (rate  only patient's report): No Awareness, Dental Status Current problems with teeth and/or dentures?: No Does patient usually wear dentures?: No  CIWA:  CIWA-Ar Total: 0 COWS:  COWS Total Score: 0  Psychiatric Specialty Exam: See Psychiatric Specialty Exam and Suicide Risk Assessment completed by Attending Physician prior to discharge.  Discharge destination:  Home  Is patient on multiple antipsychotic therapies at discharge:  No   Has Patient had three or more failed trials of antipsychotic monotherapy by history:  No Recommended Plan for Multiple Antipsychotic Therapies:  Denies  Discharge Orders   Future Orders Complete By Expires     Activity as tolerated - No restrictions  As directed     Diet - low sodium heart healthy  As directed         Medication List    TAKE these medications     Indication   amantadine 100 MG capsule  Commonly known as:  SYMMETREL  Take 1 capsule (100 mg total) by mouth 2 (two) times daily.   Indication:  cocaine cravings     ARIPiprazole 5 MG tablet  Commonly known as:  ABILIFY  Take 1 tablet (5 mg total) by mouth at bedtime.   Indication:  Major Depressive Disorder     buPROPion 150 MG 24 hr tablet  Commonly known as:  WELLBUTRIN XL  Take 1 tablet (150 mg total) by mouth daily.   Indication:  Major Depressive Disorder     escitalopram 20 MG tablet  Commonly known as:  LEXAPRO  Take 1 tablet (20 mg total) by mouth daily.   Indication:  Depression     estradiol 2 MG tablet  Commonly known as:  ESTRACE  Take 1 tablet (2 mg total) by mouth 3 (three) times daily.   Indication:  part of hormonal treatment for transgender process     finasteride 5 MG tablet  Commonly known as:  PROSCAR  Take 1 tablet (5 mg total) by mouth daily.   Indication:  part of his hormonal therapy for transgendering     spironolactone 50 MG tablet  Commonly known as:  ALDACTONE  Take 1 tablet (50 mg total) by mouth daily.   Indication:  Edema          Follow-up recommendations:  Activity:  As tolerated Diet:  Low-sodium heart healthy diet  Comments:  Patient hopes to continue his sobriety at a 90 day program in [place], she will stay in a motel until then and NA encouraged.  Total Discharge Time:  Greater than 30 minutes.  SignedDaneil Dan 06/30/2012, 8:17 AM

## 2012-06-30 NOTE — Progress Notes (Signed)
[facility] Group Notes:  (Nursing/MHT/Case Management/Adjunct)  Date:  06/30/2012  Time:  2:07 AM  Type of Therapy:  Wrap up group  Participation Level:  Minimal  Participation Quality:  Appropriate  Affect:  Depressed and Flat  Cognitive:  Appropriate  Insight:  Good  Engagement in Group:  Limited  Modes of Intervention:  Discussion and Support  Summary of Progress/Problems: Patient attended group this evening discussing patients day and going over any problems or concerns that the patient may have had.  When asked how the patients day was he stated, "not so great".  Writer asked why her day was "not so great", seeing that her day yesterday was good, she explained that she called her family today and talked with them and wish she hadn't.  Patient stated that they are not supportive and it just made her feel bad.  She also expressed how she was hoping to be leaving in the next couple of days but then found out that she would not be leaving until possibly next week and was a bit concerned about that as well.  Tomi Bamberger Coursey 06/30/2012, 2:07 AM

## 2012-06-30 NOTE — Progress Notes (Signed)
D: Patient cooperative with staff. Patient's affect/mood is appropriate to circumstance. She reported on the self inventory sheet that her appetite is good, energy level is low and ability to pay attention is good. Patient rated depression "4" and feelings of hopelessness "2". Patient is to be d/c home today.   A: Support and encouragement provided to patient. Scheduled medications administered per MD orders. Maintain Q15 minute checks for safety.  R: Patient receptive. Denies SI/HI/AVH. Patient remains safe.

## 2012-07-04 NOTE — Progress Notes (Signed)
Patient Discharge Instructions:  After Visit Summary (AVS):   Faxed to:  07/04/12 Discharge Summary Note:   Faxed to:  07/04/12 Psychiatric Admission Assessment Note:   Faxed to:  07/04/12 Suicide Risk Assessment - Discharge Assessment:   Faxed to:  07/04/12 Faxed/Sent to the Next Level Care provider:  07/04/12 Faxed to [facility] @ [phone] Faxed to [facility] @ [phone]  Jerelene Redden, 07/04/2012, 4:01 PM

## 2012-09-10 ENCOUNTER — Encounter (HOSPITAL_COMMUNITY): Payer: Self-pay | Admitting: Emergency Medicine

## 2012-09-10 ENCOUNTER — Emergency Department (HOSPITAL_COMMUNITY)
Admission: EM | Admit: 2012-09-10 | Discharge: 2012-09-12 | Disposition: A | Discharge status: Other Acute Inpatient Hospital | Payer: Medicare Other | Source: Home / Self Care | Attending: Emergency Medicine | Admitting: Emergency Medicine

## 2012-09-10 ENCOUNTER — Ambulatory Visit (HOSPITAL_COMMUNITY)
Admission: AD | Admit: 2012-09-10 | Discharge: 2012-09-10 | Disposition: A | Discharge status: Home / Self Care | Payer: Medicare Other | Attending: Psychiatry | Admitting: Psychiatry

## 2012-09-10 DIAGNOSIS — F1420 Cocaine dependence, uncomplicated: Secondary | ICD-10-CM

## 2012-09-10 DIAGNOSIS — F339 Major depressive disorder, recurrent, unspecified: Secondary | ICD-10-CM

## 2012-09-10 LAB — RAPID URINE DRUG SCREEN, HOSP PERFORMED
Amphetamines: NOT DETECTED
Benzodiazepines: NOT DETECTED
Opiates: NOT DETECTED

## 2012-09-10 MED ORDER — ALUM & MAG HYDROXIDE-SIMETH 200-200-20 MG/5ML PO SUSP: 30.0000 mL | ORAL | Status: DC | PRN

## 2012-09-10 MED ORDER — ZOLPIDEM TARTRATE 5 MG PO TABS: 5.0000 mg | ORAL_TABLET | Freq: Every evening | ORAL | Status: DC | PRN

## 2012-09-10 MED ORDER — NICOTINE 21 MG/24HR TD PT24
21.0000 mg | MEDICATED_PATCH | Freq: Every day | TRANSDERMAL | Status: DC
  Administered 2012-09-11 – 2012-09-12 (×2): 21 mg via TRANSDERMAL
  Filled 2012-09-10 (×2): qty 1

## 2012-09-10 MED ORDER — CLONAZEPAM 0.5 MG PO TABS: 0.7500 mg | ORAL_TABLET | Freq: Two times a day (BID) | ORAL | Status: DC | PRN

## 2012-09-10 MED ORDER — FINASTERIDE 5 MG PO TABS
5.0000 mg | ORAL_TABLET | Freq: Every day | ORAL | Status: DC
  Administered 2012-09-11 – 2012-09-12 (×2): 5 mg via ORAL
  Filled 2012-09-10 (×2): qty 1

## 2012-09-10 MED ORDER — ESTRADIOL 2 MG PO TABS
2.0000 mg | ORAL_TABLET | Freq: Three times a day (TID) | ORAL | Status: DC
  Administered 2012-09-11 – 2012-09-12 (×6): 2 mg via ORAL
  Filled 2012-09-10 (×7): qty 1

## 2012-09-10 MED ORDER — ONDANSETRON HCL 4 MG PO TABS: 4.0000 mg | ORAL_TABLET | Freq: Three times a day (TID) | ORAL | Status: DC | PRN

## 2012-09-10 MED ORDER — BUPROPION HCL ER (XL) 150 MG PO TB24
150.0000 mg | ORAL_TABLET | Freq: Every day | ORAL | Status: DC
  Administered 2012-09-11 – 2012-09-12 (×2): 150 mg via ORAL
  Filled 2012-09-10 (×2): qty 1

## 2012-09-10 MED ORDER — ARIPIPRAZOLE 5 MG PO TABS
5.0000 mg | ORAL_TABLET | Freq: Every day | ORAL | Status: DC
  Administered 2012-09-11 (×2): 5 mg via ORAL
  Filled 2012-09-10 (×3): qty 1

## 2012-09-10 MED ORDER — SPIRONOLACTONE 50 MG PO TABS
50.0000 mg | ORAL_TABLET | Freq: Every day | ORAL | Status: DC
  Administered 2012-09-11: 50 mg via ORAL
  Filled 2012-09-10 (×2): qty 1

## 2012-09-10 MED ORDER — ESCITALOPRAM OXALATE 10 MG PO TABS
20.0000 mg | ORAL_TABLET | Freq: Every day | ORAL | Status: DC
  Administered 2012-09-11 – 2012-09-12 (×2): 20 mg via ORAL
  Filled 2012-09-10: qty 2
  Filled 2012-09-10: qty 1
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
## 2012-09-10 NOTE — ED Notes (Signed)
Patient brought over from [facility] with Tech. The patient has Depression, SI, off med's, and relapsed on crack adn heroin

## 2012-09-11 LAB — SALICYLATE LEVEL: Salicylate Lvl: <2 mg/dL — ABNORMAL LOW (ref 2.8–20.0)

## 2012-09-11 LAB — COMPREHENSIVE METABOLIC PANEL
AST: 23 U/L (ref 0–37)
Albumin: 3.7 g/dL (ref 3.5–5.2)
Calcium: 9.4 mg/dL (ref 8.4–10.5)
Chloride: 104 mEq/L (ref 96–112)
Creatinine, Ser: 1.16 mg/dL (ref 0.50–1.35)

## 2012-09-11 LAB — CBC
MCH: 31.1 pg (ref 26.0–34.0)
MCV: 92.3 fL (ref 78.0–100.0)
Platelets: 182 10*3/uL (ref 150–400)
RDW: 13.2 % (ref 11.5–15.5)
WBC: 10.9 10*3/uL — ABNORMAL HIGH (ref 4.0–10.5)

## 2012-09-11 MED ORDER — CLONAZEPAM 0.5 MG PO TABS: 0.5000 mg | ORAL_TABLET | Freq: Two times a day (BID) | ORAL | Status: DC | PRN

## 2012-09-11 NOTE — ED Notes (Signed)
Pt present to psych ED room 43 from triage.  Pt was sent over by [facility] for medical clearance.  Pt present with  C/o SI without a plan.  Pt denies HI.  Pt denies AH at this time.  Pt has hx AH.  Pt reports being off his meds x 3 days.  Pt reports last heroine and crack usage on Wednesday.  Pt reports wanting help for meds and depression.  Pt reports that his drug usage "has gotten better, I am using less.  It used to use daily,  But now I use weekly."  Pt very tearful when talking about a past relationship that he is still sad about.  Pt denies any family involvement and refuses to speak with his family.  Pt reports "I am happier not talking to my family."  Pt reports living with friends at this time.

## 2012-09-11 NOTE — ED Notes (Signed)
Up to the bathroom 

## 2012-09-11 NOTE — [facility] Counselor (Signed)
TC to [facility] at [facility] to order telepsych consult and order form faxed. Dr. Rob Bunting will call back.  Evette Cristal, [place] Assessment Counselor

## 2012-09-11 NOTE — ED Provider Notes (Signed)
History     CSN: [id number]  Arrival date & time 09/10/12  2328   First MD Initiated Contact with Patient 09/10/12 2332      Chief Complaint  Patient presents with  . Medical Clearance    (Consider location/radiation/quality/duration/timing/severity/associated sxs/prior treatment) HPI 42 yo male presents to the ER with complaint of worsening depression, suicidal ideation, and polysubstance abuse.  Pt reports she ran out of her medications on Wednesday, normally seen at [facility] but missed an appointment there and has not been able to pay her bill.  Since running out of her medications, she has had hours long crying spells, increased sleeping, and has been abusing heroin and crack.  She has been thinking of suicide but does not have a plan or prior attempts.  Past Medical History  Diagnosis Date  . Depression   . Hepatitis C   . 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 Cellulitis  Of  Left Forearm 01/10/2012    From IVDA    Past Surgical History  Procedure Laterality Date  . I&d extremity  01/13/2012    Procedure: IRRIGATION AND DEBRIDEMENT EXTREMITY;  Surgeon: Kennieth Rad, MD;  Location: [facility];  Service: Orthopedics;  Laterality: Left;    Family History  Problem Relation Age of Onset  . Idiopathic pulmonary fibrosis Father     History  Substance Use Topics  . Smoking status: Current Every Day Smoker -- 1.50 packs/day for 20 years    Types: Cigarettes  . Smokeless tobacco: Never Used  . Alcohol Use: 3.6 oz/week    6 Cans of beer per week     Comment: 1-2 drinks 1-2 times a week.      Review of Systems  All other systems reviewed and are negative.    Allergies  Penicillins  Home Medications   Current Outpatient Rx  Name  Route  Sig  Dispense  Refill  . ARIPiprazole (ABILIFY) 5 MG tablet   Oral   Take 1 tablet (5 mg total) by mouth at bedtime.   30 tablet   0   . buPROPion (WELLBUTRIN XL) 150 MG 24 hr tablet   Oral   Take 1  tablet (150 mg total) by mouth daily.   30 tablet   0   . clonazePAM (KLONOPIN) 0.5 MG tablet   Oral   Take 0.75 mg by mouth 2 (two) times daily as needed for anxiety.         Marland Kitchen escitalopram (LEXAPRO) 20 MG tablet   Oral   Take 1 tablet (20 mg total) by mouth daily.   30 tablet   0   . estradiol (ESTRACE) 2 MG tablet   Oral   Take 1 tablet (2 mg total) by mouth 3 (three) times daily.   90 tablet   0   . finasteride (PROSCAR) 5 MG tablet   Oral   Take 1 tablet (5 mg total) by mouth daily.   30 tablet   0   . spironolactone (ALDACTONE) 50 MG tablet   Oral   Take 1 tablet (50 mg total) by mouth daily.   30 tablet   0     BP 97/65  Pulse 68  Temp(Src) 97.4 F (36.3 C) (Oral)  Resp 18  SpO2 98%  Physical Exam  Nursing note and vitals reviewed. Constitutional: He is oriented to person, place, and time. He appears well-developed and well-nourished. He appears distressed.  HENT:  Head: Normocephalic and atraumatic.  Left Ear:  External ear normal.  Nose: Nose normal.  Mouth/Throat: Oropharynx is clear and moist.  Eyes: Conjunctivae and EOM are normal. Pupils are equal, round, and reactive to light.  Neck: Normal range of motion. Neck supple. No JVD present. No tracheal deviation present. No thyromegaly present.  Cardiovascular: Normal rate, regular rhythm, normal heart sounds and intact distal pulses.  Exam reveals no gallop and no friction rub.   No murmur heard. Pulmonary/Chest: Effort normal and breath sounds normal. No stridor. No respiratory distress. He has no wheezes. He has no rales. He exhibits no tenderness.  Abdominal: Soft. Bowel sounds are normal. He exhibits no distension and no mass. There is no tenderness. There is no rebound and no guarding.  Musculoskeletal: Normal range of motion. He exhibits no edema and no tenderness.  Lymphadenopathy:    He has no cervical adenopathy.  Neurological: He is alert and oriented to person, place, and time. He has  normal reflexes. No cranial nerve deficit. He exhibits normal muscle tone. Coordination normal.  Skin: Skin is dry. No rash noted. No erythema. No pallor.  Psychiatric: Judgment and thought content normal.  Tearful, flat affect    ED Course  Procedures (including critical care time)  Labs Reviewed  URINE RAPID DRUG SCREEN (HOSP PERFORMED) - Abnormal; Notable for the following:    Cocaine POSITIVE (*)    All other components within normal limits  ACETAMINOPHEN LEVEL  CBC  COMPREHENSIVE METABOLIC PANEL  ETHANOL  SALICYLATE LEVEL   No results found.   1. Major depression, recurrent   2. Cocaine dependence       MDM  42 yo male with worsening depression and SI after going off meds 3 days ago.  Will have ACT assess, may be able to d/c with refill of meds.      3:19 AM Pt has been seen by telepsych, Dr Rob Bunting, and recommended inpatient admission.  ACT aware.  Olivia Mackie, MD 09/11/12 [phone]

## 2012-09-12 ENCOUNTER — Encounter (HOSPITAL_COMMUNITY): Payer: Self-pay | Admitting: Rehabilitation

## 2012-09-12 ENCOUNTER — Inpatient Hospital Stay (HOSPITAL_COMMUNITY)
Admission: AD | Admit: 2012-09-12 | Discharge: 2012-09-14 | DRG: 885 | Disposition: A | Discharge status: Home / Self Care | Payer: Medicare Other | Source: Ambulatory Visit | Attending: Psychiatry | Admitting: Psychiatry

## 2012-09-12 DIAGNOSIS — F339 Major depressive disorder, recurrent, unspecified: Secondary | ICD-10-CM | POA: Diagnosis present

## 2012-09-12 DIAGNOSIS — F411 Generalized anxiety disorder: Secondary | ICD-10-CM | POA: Diagnosis present

## 2012-09-12 DIAGNOSIS — F1910 Other psychoactive substance abuse, uncomplicated: Secondary | ICD-10-CM | POA: Diagnosis present

## 2012-09-12 DIAGNOSIS — Z720 Tobacco use: Secondary | ICD-10-CM

## 2012-09-12 DIAGNOSIS — F1410 Cocaine abuse, uncomplicated: Secondary | ICD-10-CM

## 2012-09-12 DIAGNOSIS — F419 Anxiety disorder, unspecified: Secondary | ICD-10-CM

## 2012-09-12 DIAGNOSIS — E349 Endocrine disorder, unspecified: Secondary | ICD-10-CM

## 2012-09-12 DIAGNOSIS — B1920 Unspecified viral hepatitis C without hepatic coma: Secondary | ICD-10-CM | POA: Diagnosis present

## 2012-09-12 DIAGNOSIS — F332 Major depressive disorder, recurrent severe without psychotic features: Principal | ICD-10-CM | POA: Diagnosis present

## 2012-09-12 DIAGNOSIS — E871 Hypo-osmolality and hyponatremia: Secondary | ICD-10-CM

## 2012-09-12 DIAGNOSIS — Z79899 Other long term (current) drug therapy: Secondary | ICD-10-CM

## 2012-09-12 DIAGNOSIS — F1420 Cocaine dependence, uncomplicated: Secondary | ICD-10-CM

## 2012-09-12 DIAGNOSIS — R45851 Suicidal ideations: Secondary | ICD-10-CM

## 2012-09-12 MED ORDER — ACETAMINOPHEN 325 MG PO TABS: 650.0000 mg | ORAL_TABLET | Freq: Four times a day (QID) | ORAL | Status: DC | PRN

## 2012-09-12 MED ORDER — CLONAZEPAM 0.5 MG PO TABS
0.7500 mg | ORAL_TABLET | Freq: Two times a day (BID) | ORAL | Status: DC | PRN
  Administered 2012-09-13 – 2012-09-14 (×2): 0.75 mg via ORAL
  Filled 2012-09-12 (×2): qty 2

## 2012-09-12 MED ORDER — SPIRONOLACTONE 50 MG PO TABS
50.0000 mg | ORAL_TABLET | Freq: Every day | ORAL | Status: DC
  Administered 2012-09-13 – 2012-09-14 (×2): 50 mg via ORAL
  Filled 2012-09-12 (×3): qty 1

## 2012-09-12 MED ORDER — ARIPIPRAZOLE 5 MG PO TABS
5.0000 mg | ORAL_TABLET | Freq: Every day | ORAL | Status: DC
  Administered 2012-09-12 – 2012-09-13 (×2): 5 mg via ORAL
  Filled 2012-09-12 (×4): qty 1

## 2012-09-12 MED ORDER — ESTRADIOL 2 MG PO TABS
2.0000 mg | ORAL_TABLET | Freq: Three times a day (TID) | ORAL | Status: DC
Start: 2012-09-13 — End: 2012-09-14
  Administered 2012-09-13 – 2012-09-14 (×5): 2 mg via ORAL
  Filled 2012-09-12 (×7): qty 1

## 2012-09-12 MED ORDER — ESCITALOPRAM OXALATE 20 MG PO TABS
20.0000 mg | ORAL_TABLET | Freq: Every day | ORAL | Status: DC
  Administered 2012-09-13 – 2012-09-14 (×2): 20 mg via ORAL
  Filled 2012-09-12 (×4): qty 1

## 2012-09-12 MED ORDER — FINASTERIDE 5 MG PO TABS
5.0000 mg | ORAL_TABLET | Freq: Every day | ORAL | Status: DC
  Administered 2012-09-13 – 2012-09-14 (×2): 5 mg via ORAL
  Filled 2012-09-12 (×3): qty 1

## 2012-09-12 MED ORDER — MAGNESIUM HYDROXIDE 400 MG/5ML PO SUSP: 30.0000 mL | Freq: Every day | ORAL | Status: DC | PRN

## 2012-09-12 MED ORDER — NICOTINE 14 MG/24HR TD PT24
14.0000 mg | MEDICATED_PATCH | Freq: Every day | TRANSDERMAL | Status: DC
  Administered 2012-09-13 – 2012-09-14 (×2): 14 mg via TRANSDERMAL
  Filled 2012-09-12 (×4): qty 1

## 2012-09-12 MED ORDER — BUPROPION HCL ER (XL) 150 MG PO TB24
150.0000 mg | ORAL_TABLET | Freq: Every day | ORAL | Status: DC
  Administered 2012-09-13 – 2012-09-14 (×2): 150 mg via ORAL
  Filled 2012-09-12 (×4): qty 1

## 2012-09-12 MED ORDER — ALUM & MAG HYDROXIDE-SIMETH 200-200-20 MG/5ML PO SUSP: 30.0000 mL | ORAL | Status: DC | PRN

## 2012-09-12 NOTE — [facility] Counselor (Signed)
Pt referred to [facility] but declined by Dr. Forrestine Him due to medical acuity. Dr. Forrestine Him recommends med psych unit due to  elevated WBC and cellulitis.

## 2012-09-12 NOTE — [facility] Counselor (Addendum)
Patient accepted to [facility] 506-1. Patient's bed is not available at this time and staff from the [facility] assessment office will call when the bed becomes available. Per Tom's not placed in EPIC at this time no appropriate bed is available, but it is anticipated that one will open later today  Patient's nurse-Eric notified of the above information.   Writer completed patient's support paperwork and faxed to [facility] assessment office.  *Support paperwork placed in patients chart.

## 2012-09-12 NOTE — ED Notes (Signed)
Report called to Beverly at [facility] 

## 2012-09-12 NOTE — [facility] Counselor (Signed)
Referral faxed to [facility]. Tionne at [facility] says there aren't any beds available but they expect several discharges later today.  Evette Cristal, [place] Assessment Counselor

## 2012-09-12 NOTE — [facility] Counselor (Signed)
Pt submitted for admission to [facility]. Maryjean Morn, NP reviewed clinical information and recommended that Pt be considered for admission when an appropriate bed is available. Per Laverle Hobby, [facility] the adult unit is at capacity.   Harlin Rain Patsy Baltimore, LPC, [facility]  Assessment Counselor

## 2012-09-12 NOTE — ED Provider Notes (Signed)
Pt resting comfortably, no concerns from RN overnight. 0740 Accepted at [facility]. 1500  Hurman Horn, MD 09/13/12 1436

## 2012-09-12 NOTE — [facility] Counselor (Signed)
Per Alice, Pt's bed is ready At [facility]. Writer notified EDP-Dr. Bednar and patient's nurse-Eric.  

## 2012-09-12 NOTE — BH Assessment (Addendum)
[facility] Assessment Progress Note  Per Thurman Coyer, RN, Administrative Coordinator, pt reviewed with Verne Spurr, PA.  Lloyd Huger tentatively accepts pt to [facility] pending availability of a bed on the 500 hall.  At this time no appropriate bed is available, but it is anticipated that one will open later today. At 11:48 I attempted to call Melynda Ripple, Assessment Counselor, but was unsuccessful at reaching her.  Will try again later.  Doylene Canning, MA Assessment Counselor 09/12/2012 @ 11:50  At 12:05 I reached [place] and notified her.  Doylene Canning, MA Assessment Counselor 09/12/2012 @ 12:06

## 2012-09-12 NOTE — Progress Notes (Signed)
Randy [name] is a patient admitted from the ED with depression and suicidal ideation.  She states the she owed a fee to MD's office and could not get prescriptions filled.  She started to have an increase in suicidal ideation and depression without medications.  She states that she feels much better now and wants to be released.  She signed 72 hour request for discharge.  She states that she has passive SI, but feels that way all the time.  Contracts for safety at this time.  Ellwyn also has had some history of drug use and has been using cocaine and heroin once a week, last using was Wednesday of last week.  She states that she is staying with friends and that she will return there after discharge.  No complaints of pain or discomfort at this time.

## 2012-09-12 NOTE — Progress Notes (Signed)
Adult Psychoeducational Group Note  Date:  09/12/2012 Time:  8:00PM Group Topic/Focus:  Wrap-Up Group:   The focus of this group is to help patients review their daily goal of treatment and discuss progress on daily workbooks.  Participation Level:  Did Not Attend   Additional Comments: Pt. Didn't attend group.   Bing Plume D 09/12/2012, 9:23 PM

## 2012-09-13 DIAGNOSIS — F1410 Cocaine abuse, uncomplicated: Secondary | ICD-10-CM

## 2012-09-13 DIAGNOSIS — F339 Major depressive disorder, recurrent, unspecified: Secondary | ICD-10-CM

## 2012-09-13 NOTE — Progress Notes (Addendum)
D:  Patient's self inventory sheet, patient has fair sleep, good appetite, low energy level, good attention span.  Rated depression and hopelessness #4.  Denied withdrawals.  Denied SI.   A:  Medications administered per MD orders.  Encouragement and emotional support given throughout day. R:  Patient denied SI and HI.  Denied A/V hallucinations.  Denied pain.  Staff will continue to monitor patient every 15 minutes for safety.  Safety maintained.

## 2012-09-13 NOTE — H&P (Signed)
Psychiatric Admission Assessment Adult  Patient Identification:  Randy [name] Date of Evaluation:  09/13/2012 Chief Complaint:  mood disorder nos 296.90 History of Present Illness: Randy [name] is an 42 y.o. male, single, [name]-to-male transgendered person who presented with increased depression and suicidal thoughts over past 4 days. She reports not talking her medications for past week and using drugs that caused her to decompensate. She reports becoming very tearful, depressed, hopeless and suicidal. Was unable to get her medications filled due to missing appointments with her outpatient psychiatrist and because she owes a bill to the practice.Pt has a history of depression, anxiety and substance abuse and has been inpatient at [facility] several times. Pt denies any self-harm behaviors. She denies homicidal ideation or a history of violence. She denies any psychotic symptoms. Pt reports she is using crack cocaine 1-2 times per week and also using IV heroin every couple of days stating "I've cut down." She states her last use of crack and heroin was "a few days ago" and she denies any current withdrawal symptoms other then mild chills. She reports drinking a 40 oz beer last night but says she only drinks 1-2 times per month.  Pt reports her primary stressor is being off all her prescription medications. She is also facing multiple legal charges and has a court date 09/12/2012. Legal charges include cocaine possession, drug paraphernalia, maintaining a dwelling, breaking and entering, possession of stolen goods and obtaining money under false pretenses  Elements:  Location:  Adult inpatient [facility] services.. Quality:  depressed mood, isolating. Severity:  suicidal thoughts. Timing:  past week. Duration:  several yeras. Context:  stopping medications. Associated Signs/Synptoms: Depression Symptoms:  depressed mood, anhedonia, insomnia, psychomotor agitation, feelings of  worthlessness/guilt, difficulty concentrating, recurrent thoughts of death, panic attacks, (Hypo) Manic Symptoms:  denies Anxiety Symptoms:  Excessive Worry, Psychotic Symptoms:  denies PTSD Symptoms: Negative  Psychiatric Specialty Exam: Physical Exam  Review of Systems  Constitutional: Negative.   HENT: Negative.   Eyes: Negative.   Respiratory: Negative.   Cardiovascular: Negative.   Gastrointestinal: Negative.   Genitourinary: Negative.   Musculoskeletal: Negative.   Skin: Negative.   Neurological: Negative.   Endo/Heme/Allergies: Negative.   Psychiatric/Behavioral: Positive for depression, suicidal ideas and substance abuse. The patient is nervous/anxious.     Blood pressure 110/76, pulse 82, temperature 97.8 F (36.6 C), temperature source Oral, resp. rate 20, height 5\' 6"  (1.676 m), weight 71.668 kg (158 lb), SpO2 100.00%.Body mass index is 25.51 kg/(m^2).  General Appearance: Disheveled  Eye Solicitor::  Fair  Speech:  Clear and Coherent  Volume:  Normal  Mood:  Anxious, Depressed and Dysphoric  Affect:  Constricted and Depressed  Thought Process:  Circumstantial  Orientation:  Full (Time, Place, and Person)  Thought Content:  Rumination  Suicidal Thoughts:  No  Homicidal Thoughts:  No  Memory:  Immediate;   Fair Recent;   Fair Remote;   Fair  Judgement:  Fair  Insight:  Present  Psychomotor Activity:  Decreased  Concentration:  Fair  Recall:  Fair  Akathisia:  No  Handed:  Right  AIMS (if indicated):     Assets:  Communication Skills Desire for Improvement Housing  Sleep:  Number of Hours: 3    Past Psychiatric History: Diagnosis:  Hospitalizations:  Outpatient Care:  Substance Abuse Care:  Self-Mutilation:  Suicidal Attempts:  Violent Behaviors:   Past Medical History:   Past Medical History  Diagnosis Date  . Depression   . Hepatitis C   .  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 Cellulitis  Of  Left Forearm 01/10/2012     From IVDA    Allergies:   Allergies  Allergen Reactions  . Penicillins Other (See Comments)    Swollen joints    PTA Medications: Prescriptions prior to admission  Medication Sig Dispense Refill  . ARIPiprazole (ABILIFY) 5 MG tablet Take 1 tablet (5 mg total) by mouth at bedtime.  30 tablet  0  . buPROPion (WELLBUTRIN XL) 150 MG 24 hr tablet Take 1 tablet (150 mg total) by mouth daily.  30 tablet  0  . clonazePAM (KLONOPIN) 0.5 MG tablet Take 0.75 mg by mouth 2 (two) times daily as needed for anxiety.      Marland Kitchen escitalopram (LEXAPRO) 20 MG tablet Take 1 tablet (20 mg total) by mouth daily.  30 tablet  0  . estradiol (ESTRACE) 2 MG tablet Take 1 tablet (2 mg total) by mouth 3 (three) times daily.  90 tablet  0  . finasteride (PROSCAR) 5 MG tablet Take 1 tablet (5 mg total) by mouth daily.  30 tablet  0  . spironolactone (ALDACTONE) 50 MG tablet Take 1 tablet (50 mg total) by mouth daily.  30 tablet  0    Previous Psychotropic Medications:  Medication/Dose                 Substance Abuse History in the last 12 months:  yes  Consequences of Substance Abuse: Legal Consequences:  pending court charges  Social History:  reports that he has been smoking Cigarettes.  He has a 30 pack-year smoking history. He has never used smokeless tobacco. He reports that  drinks alcohol. He reports that he uses illicit drugs (Cocaine, Marijuana, Heroin, and "Crack" cocaine) about 7 times per week. Additional Social History: Pain Medications: Denies Prescriptions: Denies Over the Counter: Denies History of alcohol / drug use?: Yes Negative Consequences of Use: Financial;Legal;Personal relationships Name of Substance 1: Cocaine 1 - Age of First Use: 52 Name of Substance 2: Heroin                Current Place of Residence:   Place of Birth:   Family Members: Marital Status:  Single Children:  Sons:  Daughters: Relationships: Education:  HS Print production planner  Problems/Performance: Religious Beliefs/Practices: History of Abuse (Emotional/Phsycial/Sexual) Occupational Experiences; Military History:  None. Legal History: Hobbies/Interests:  Family History:   Family History  Problem Relation Age of Onset  . Idiopathic pulmonary fibrosis Father     Results for orders placed during the hospital encounter of 09/10/12 (from the past 72 hour(s))  URINE RAPID DRUG SCREEN (HOSP PERFORMED)     Status: Abnormal   Collection Time    09/10/12 11:30 PM      Result Value Range   Opiates NONE DETECTED  NONE DETECTED   Cocaine POSITIVE (*) NONE DETECTED   Benzodiazepines NONE DETECTED  NONE DETECTED   Amphetamines NONE DETECTED  NONE DETECTED   Tetrahydrocannabinol NONE DETECTED  NONE DETECTED   Barbiturates NONE DETECTED  NONE DETECTED   Comment:            DRUG SCREEN FOR MEDICAL PURPOSES     ONLY.  IF CONFIRMATION IS NEEDED     FOR ANY PURPOSE, NOTIFY LAB     WITHIN 5 DAYS.                LOWEST DETECTABLE LIMITS     FOR URINE DRUG SCREEN  Drug Class       Cutoff (ng/mL)     Amphetamine      1000     Barbiturate      200     Benzodiazepine   200     Tricyclics       300     Opiates          300     Cocaine          300     THC              50  ACETAMINOPHEN LEVEL     Status: None   Collection Time    09/10/12 11:50 PM      Result Value Range   Acetaminophen (Tylenol), Serum <15.0  10 - 30 ug/mL   Comment:            THERAPEUTIC CONCENTRATIONS VARY     SIGNIFICANTLY. A RANGE OF 10-30     ug/mL MAY BE AN EFFECTIVE     CONCENTRATION FOR MANY PATIENTS.     HOWEVER, SOME ARE BEST TREATED     AT CONCENTRATIONS OUTSIDE THIS     RANGE.     ACETAMINOPHEN CONCENTRATIONS     >150 ug/mL AT 4 HOURS AFTER     INGESTION AND >50 ug/mL AT 12     HOURS AFTER INGESTION ARE     OFTEN ASSOCIATED WITH TOXIC     REACTIONS.  CBC     Status: Abnormal   Collection Time    09/10/12 11:50 PM      Result Value Range   WBC 10.9 (*) 4.0 - 10.5 K/uL    RBC 4.95  4.22 - 5.81 MIL/uL   Hemoglobin 15.4  13.0 - 17.0 g/dL   HCT 78.2  95.6 - 21.3 %   MCV 92.3  78.0 - 100.0 fL   MCH 31.1  26.0 - 34.0 pg   MCHC 33.7  30.0 - 36.0 g/dL   RDW 08.6  57.8 - 46.9 %   Platelets 182  150 - 400 K/uL  COMPREHENSIVE METABOLIC PANEL     Status: Abnormal   Collection Time    09/10/12 11:50 PM      Result Value Range   Sodium 137  135 - 145 mEq/L   Potassium 4.0  3.5 - 5.1 mEq/L   Chloride 104  96 - 112 mEq/L   CO2 27  19 - 32 mEq/L   Glucose, Bld 83  70 - 99 mg/dL   BUN 8  6 - 23 mg/dL   Creatinine, Ser 6.29  0.50 - 1.35 mg/dL   Calcium 9.4  8.4 - 52.8 mg/dL   Total Protein 7.1  6.0 - 8.3 g/dL   Albumin 3.7  3.5 - 5.2 g/dL   AST 23  0 - 37 U/L   ALT 44  0 - 53 U/L   Alkaline Phosphatase 57  39 - 117 U/L   Total Bilirubin 0.4  0.3 - 1.2 mg/dL   GFR calc non Af Amer 77 (*) >90 mL/min   GFR calc Af Amer 89 (*) >90 mL/min   Comment:            The eGFR has been calculated     using the CKD EPI equation.     This calculation has not been     validated in all clinical     situations.     eGFR's persistently     <90  mL/min signify     possible Chronic Kidney Disease.  ETHANOL     Status: None   Collection Time    09/10/12 11:50 PM      Result Value Range   Alcohol, Ethyl (B) <11  0 - 11 mg/dL   Comment:            LOWEST DETECTABLE LIMIT FOR     SERUM ALCOHOL IS 11 mg/dL     FOR MEDICAL PURPOSES ONLY  SALICYLATE LEVEL     Status: Abnormal   Collection Time    09/10/12 11:50 PM      Result Value Range   Salicylate Lvl <2.0 (*) 2.8 - 20.0 mg/dL   Psychological Evaluations:  Assessment:   AXIS I:  Major Depression, Recurrent severe AXIS II:  Deferred AXIS III:   Past Medical History  Diagnosis Date  . Depression   . Hepatitis C   . 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 Cellulitis  Of  Left Forearm 01/10/2012    From IVDA   AXIS IV:  other psychosocial or environmental problems and problems related to legal  system/crime AXIS V:  41-50 serious symptoms  Treatment Plan/Recommendations:   Start home medications. Provide supportive counselling and education. Labs reviewed, within normal limits. UDS positive for cocaine.  Treatment Plan Summary: Daily contact with patient to assess and evaluate symptoms and progress in treatment Medication management Current Medications:  Current Facility-Administered Medications  Medication Dose Route Frequency Provider Last Rate Last Dose  . acetaminophen (TYLENOL) tablet 650 mg  650 mg Oral Q6H PRN Kerry Hough, PA-C      . alum & mag hydroxide-simeth (MAALOX/MYLANTA) 200-200-20 MG/5ML suspension 30 mL  30 mL Oral Q4H PRN Kerry Hough, PA-C      . ARIPiprazole (ABILIFY) tablet 5 mg  5 mg Oral QHS Kerry Hough, PA-C   5 mg at 09/12/12 2330  . buPROPion (WELLBUTRIN XL) 24 hr tablet 150 mg  150 mg Oral Daily Kerry Hough, PA-C   150 mg at 09/13/12 0817  . clonazePAM (KLONOPIN) tablet 0.75 mg  0.75 mg Oral BID PRN Kerry Hough, PA-C      . escitalopram (LEXAPRO) tablet 20 mg  20 mg Oral Daily Kerry Hough, PA-C   20 mg at 09/13/12 0817  . estradiol (ESTRACE) tablet 2 mg  2 mg Oral TID Kerry Hough, PA-C   2 mg at 09/13/12 1145  . finasteride (PROSCAR) tablet 5 mg  5 mg Oral Daily Kerry Hough, PA-C   5 mg at 09/13/12 1013  . magnesium hydroxide (MILK OF MAGNESIA) suspension 30 mL  30 mL Oral Daily PRN Kerry Hough, PA-C      . nicotine (NICODERM CQ - dosed in mg/24 hours) patch 14 mg  14 mg Transdermal Q0600 Kerry Hough, PA-C   14 mg at 09/13/12 4098  . spironolactone (ALDACTONE) tablet 50 mg  50 mg Oral Daily Kerry Hough, PA-C   50 mg at 09/13/12 0818    Observation Level/Precautions:  15 minute checks  Laboratory:  Per admission orders  Psychotherapy:  groups  Medications:  As appropriate  Consultations:  As needed  Discharge Concerns:  Safety and stabilization  Estimated LOS:4-5 days  Other:     I certify that  inpatient services furnished can reasonably be expected to improve the patient's condition.   Jyl Chico 5/6/201412:07 PM

## 2012-09-13 NOTE — Progress Notes (Signed)
Pt. Denies SI, depression and anxiety this pm.  Pt. Told writer he had been off his medications prior to admission, but had got back on them while in the emergency room and was feeling much better before he got to [facility].  Pt. Is calm and cooperative, he attends groups and has no complaints of pain or discomfort.  Pt. Usually stays off to hisself when in the dayroom, little interaction is noted.  Continue 15 min cks. To maintain pt. Safety.

## 2012-09-13 NOTE — Progress Notes (Signed)
Adult Psychoeducational Group Note  Date:  09/13/2012 Time:  2:08 PM  Group Topic/Focus:  Recovery Goals:   The focus of this group is to identify appropriate goals for recovery and establish a plan to achieve them.  Participation Level:  Active  Participation Quality:  Appropriate, Attentive and Sharing  Affect:  Appropriate  Cognitive:  Alert and Appropriate  Insight: Appropriate  Engagement in Group:  Engaged  Modes of Intervention:  Discussion  Additional Comments:  Pt was appropriate and attentive while attending group. Pt shared that wanting everyone to change and realizing that change is not always possible is what sands between her and recovery.   Sharyn Lull 09/13/2012, 2:08 PM

## 2012-09-13 NOTE — [facility] Group Notes (Signed)
[facility] LCSW Aftercare Discharge Planning Group Note   09/13/2012 12:53 PM  Participation Quality:  Appropriate  Mood/Affect:  Appropriate and Depressed  Depression Rating:  4  Anxiety Rating:  4  Thoughts of Suicide:  No  Will you contract for safety?   NA  Current AVH:  No  Plan for Discharge/Comments:  Patient reports admitting to hospital as a result of not being on medications and becoming depressed and suicidal.  She reports not following up with previous outpatient recommendations.  Patient will need referral for follow up at discharge.  Transportation Means: Patient uses public transportation.   Supports:  Patient has limited support system.   Randy [name], Joesph July

## 2012-09-13 NOTE — [facility] Group Notes (Signed)
[facility] LCSW Group Therapy      Feelings About Diagnosis 1:15 - 2:30 PM          09/13/2012 4:15 PM  Type of Therapy:  Group Therapy  Participation Level:  Minimal  Participation Quality:  Appropriate  Affect:  Appropriate and Depressed  Cognitive:  Alert and Appropriate  Insight:  Developing/Improving  Engagement in Therapy:  Developing/Improving  Modes of Intervention:  Discussion, Education, Exploration, Problem-Solving, Rapport Building, Support  Summary of Progress/Problems:  Patient listened but did not offer any comments to the discussion.  Wynn Banker 09/13/2012, 4:15 PM

## 2012-09-13 NOTE — [facility] Suicide Risk Assessment (Signed)
Suicide Risk Assessment  Admission Assessment     Nursing information obtained from:    Demographic factors:   Transgender male Current Mental Status: Alert and oriented to 4. Depressed mood, intermittent suicidal thoughts.   Loss Factors:    Historical Factors:   multiple admissions, polysubstance abuse Risk Reduction Factors:   housing,social support  CLINICAL FACTORS:   Depression:   Anhedonia Comorbid alcohol abuse/dependence Hopelessness Impulsivity Insomnia Alcohol/Substance Abuse/Dependencies  COGNITIVE FEATURES THAT CONTRIBUTE TO RISK:  Cognitively intact  SUICIDE RISK:   Minimal: No identifiable suicidal ideation.  Patients presenting with no risk factors but with morbid ruminations; may be classified as minimal risk based on the severity of the depressive symptoms  PLAN OF CARE: Start home medications. Provide supportive counselling.  I certify that inpatient services furnished can reasonably be expected to improve the patient's condition.  Randy [name] 09/13/2012, 12:17 PM

## 2012-09-14 MED ORDER — ARIPIPRAZOLE 5 MG PO TABS: 5.0000 mg | ORAL_TABLET | Freq: Every day | ORAL | Status: DC

## 2012-09-14 MED ORDER — FINASTERIDE 5 MG PO TABS: 5.0000 mg | ORAL_TABLET | Freq: Every day | ORAL | Status: DC

## 2012-09-14 MED ORDER — ESTRADIOL 2 MG PO TABS: 2.0000 mg | ORAL_TABLET | Freq: Three times a day (TID) | ORAL | Status: DC

## 2012-09-14 MED ORDER — SPIRONOLACTONE 50 MG PO TABS: 50.0000 mg | ORAL_TABLET | Freq: Every day | ORAL | Status: DC

## 2012-09-14 MED ORDER — BUPROPION HCL ER (XL) 150 MG PO TB24: 150.0000 mg | ORAL_TABLET | Freq: Every day | ORAL | Status: DC

## 2012-09-14 MED ORDER — ESCITALOPRAM OXALATE 20 MG PO TABS: 20.0000 mg | ORAL_TABLET | Freq: Every day | ORAL | Status: DC

## 2012-09-14 NOTE — [facility] Group Notes (Signed)
[facility] LCSW Aftercare Discharge Planning Group Note   09/14/2012 8:38 AM  Participation Quality:  Appropriate   Mood/Affect:  Appropriate  Depression Rating:  4  Anxiety Rating:  4  Thoughts of Suicide:  No  Will you contract for safety?   NA  Current AVH:  No  Plan for Discharge/Comments:  Patient reports doing much better and being ready to discharge home today.  She shares she does have SI from time to time but nothing she would act on.  Transportation Means:  Patient has transportation.   Supports:  Patient has limited supports. \   Jamen Loiseau, Joesph July

## 2012-09-14 NOTE — Progress Notes (Signed)
Patient denies SI/HI, denies A/V hallucinations. Patient verbalizes understanding of discharge instructions, follow up care and prescriptions. Patient given all belongings from [facility] locker. Patient escorted out by staff, transported by public bus.

## 2012-09-14 NOTE — Progress Notes (Signed)
[facility] Adult Case Management Discharge Plan :  Will you be returning to the same living situation after discharge: Yes,  Patient will return to her home. At discharge, do you have transportation home?:Yes,  Patient assisted with bus pass Do you have the ability to pay for your medications:Yes,  patient is able to afford medications.  Release of information consent forms completed and in the chart;  Patient's signature needed at discharge.  Patient to Follow up at: Follow-up Information   Follow up with [facility] On 09/15/2012. ( Please go to [facility]'s walk in clinic on Thursday, Sep 15, 2012 or any weekday between 8AM-3PM)    Contact information:   [address] [place], [place]   [address]  [phone]      Patient denies SI/HI:   Yes,  Patient is not endorsing SI/HI or thoughts of self harm.    Safety Planning and Suicide Prevention discussed:  Yes,  .reviewed during aftercare group.  Wynn Banker 09/14/2012, 1:18 PM
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
## 2012-09-14 NOTE — [facility] Suicide Risk Assessment (Signed)
Suicide Risk Assessment  Discharge Assessment     Demographic Factors:  Male, Caucasian, Low socioeconomic status and Unemployed  Mental Status Per Nursing Assessment::   On Admission:     Current Mental Status by Physician: Alert and oriented to 4. Denies aH/Vh/Si/HI.  Loss Factors: Financial problems/change in socioeconomic status  Historical Factors: Impulsivity  Risk Reduction Factors:   Positive coping skills or problem solving skills  Continued Clinical Symptoms:  Depression:   Recent sense of peace/wellbeing  Cognitive Features That Contribute To Risk:  Cognitively intact  Suicide Risk:  Minimal: No identifiable suicidal ideation.  Patients presenting with no risk factors but with morbid ruminations; may be classified as minimal risk based on the severity of the depressive symptoms  Discharge Diagnoses:   AXIS I:  Major Depression, Recurrent severe AXIS II:  Deferred AXIS III:   Past Medical History  Diagnosis Date  . Depression   . Hepatitis C   . 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 Cellulitis  Of  Left Forearm 01/10/2012    From IVDA   AXIS IV:  economic problems and other psychosocial or environmental problems AXIS V:  61-70 mild symptoms  Plan Of Care/Follow-up recommendations:  Activity:  as tolerated Diet:  regular Follow up with outpatient appointments.  Is patient on multiple antipsychotic therapies at discharge:  No   Has Patient had three or more failed trials of antipsychotic monotherapy by history:  No  Recommended Plan for Multiple Antipsychotic Therapies: NA  Randy [name] 09/14/2012, 10:36 AM

## 2012-09-14 NOTE — Progress Notes (Signed)
Adult Psychoeducational Group Note  Date:  09/14/2012 Time:  1:34 PM  Group Topic/Focus:  Therapeutic Activity- "Apples to Apples"  Participation Level:  Active  Participation Quality:  Appropriate, Attentive and Sharing  Affect:  Appropriate  Cognitive:  Appropriate  Insight: Appropriate  Engagement in Group:  Engaged  Modes of Intervention:  Activity  Additional Comments:  Pt participated in a game called "Apples to Apples". Pt was willing to interact and appeared to have enjoyed the activity.  Sharyn Lull 09/14/2012, 1:34 PM

## 2012-09-15 NOTE — Discharge Summary (Signed)
Physician Discharge Summary Note  Patient:  Randy [name] is an 42 y.o., male MRN:  [id number] DOB:  07-Nov-1970 Patient phone:  [phone] (home)  Patient address:   [address] [place] [address],   Date of Admission:  09/12/2012 Date of Discharge: 09/14/2012  Reason for Admission:  Depression with suicidal ideations and drug abuse  Discharge Diagnoses: Active Problems:   Major depression, recurrent  Review of Systems  Constitutional: Negative.   HENT: Negative.   Eyes: Negative.   Respiratory: Negative.   Cardiovascular: Negative.   Gastrointestinal: Negative.   Genitourinary: Negative.   Musculoskeletal: Negative.   Skin: Negative.   Neurological: Negative.   Endo/Heme/Allergies: Negative.   Psychiatric/Behavioral: Positive for depression and substance abuse. The patient is nervous/anxious.    Axis Diagnosis:   AXIS I:  Anxiety Disorder NOS, Major Depression, Recurrent severe and Substance Abuse AXIS II:  Deferred AXIS III:   Past Medical History  Diagnosis Date  . Depression   . Hepatitis C   . 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 Cellulitis  Of  Left Forearm 01/10/2012    From IVDA   AXIS IV:  other psychosocial or environmental problems, problems related to legal system/crime, problems related to social environment and problems with primary support group AXIS V:  61-70 mild symptoms  Level of Care:  OP  Hospital Course:  On admission:  [name] an 42 y.o. male, single, [name]-to-male transgendered person who presented with increased depression and suicidal thoughts over past 4 days. She reports not talking her medications for past week and using drugs that caused her to decompensate. She reports becoming very tearful, depressed, hopeless and suicidal. Was unable to get her medications filled due to missing appointments with her outpatient psychiatrist and because she owes a bill to the practice.Pt has a history of depression,  anxiety and substance abuse and has been inpatient at [facility] several times. Pt denies any self-harm behaviors. She denies homicidal ideation or a history of violence. She denies any psychotic symptoms. Pt reports she is using crack cocaine 1-2 times per week and also using IV heroin every couple of days stating "I've cut down." She states her last use of crack and heroin was "a few days ago" and she denies any current withdrawal symptoms other then mild chills. She reports drinking a 40 oz beer last night but says she only drinks 1-2 times per month.   Pt reports her primary stressor is being off all her prescription medications. She is also facing multiple legal charges and has a court date 09/12/2012. Legal charges include cocaine possession, drug paraphernalia, maintaining a dwelling, breaking and entering, possession of stolen goods and obtaining money under false pretenses.  During hospitalization:  Medication managed--Abilify 5 mg at bedtime, Wellbutrin 150 mg daily, and Lexapro 20 mg continued from her home medications along with her hormonal treatment for her transgender and her aldactone 50 mg daily for blood pressure.  Her klonopin 0.5 mg BID discontinued.  Alva attended groups with participation.  She restarted her medications except for the klonopin and plans to stay on them and pursue future medication acquirements from [facility] along with therapy--or will go to [facility] for outpatient therapy.  Patient denied suicidal/homicidal ideations and auditory/visual hallucinations, follow-up appointments encouraged to attend, outside support groups encouraged and information given.  Rx given at discharge, Messiyah is physically and mentally stable at discharge.  Consults:  None  Significant Diagnostic Studies:  labs: Completed and reviewed, stable  Discharge Vitals:   Blood pressure 115/68, pulse 87, temperature 97.5 F (36.4 C), temperature source Oral, resp. rate 16, height 5\' 6"  (1.676 m), weight 71.668  kg (158 lb), SpO2 100.00%. Body mass index is 25.51 kg/(m^2). Lab Results:   No results found for this or any previous visit (from the past 72 hour(s)).  Physical Findings: AIMS: Facial and Oral Movements Muscles of Facial Expression: None, normal Lips and Perioral Area: None, normal Jaw: None, normal Tongue: None, normal,Extremity Movements Upper (arms, wrists, hands, fingers): None, normal Lower (legs, knees, ankles, toes): None, normal, Trunk Movements Neck, shoulders, hips: None, normal, Overall Severity Severity of abnormal movements (highest score from questions above): None, normal Incapacitation due to abnormal movements: None, normal Patient's awareness of abnormal movements (rate only patient's report): No Awareness, Dental Status Current problems with teeth and/or dentures?: No Does patient usually wear dentures?: No  CIWA:  CIWA-Ar Total: 3 COWS:  COWS Total Score: 2  Psychiatric Specialty Exam: See Psychiatric Specialty Exam and Suicide Risk Assessment completed by Attending Physician prior to discharge.  Discharge destination:  Home  Is patient on multiple antipsychotic therapies at discharge:  No   Has Patient had three or more failed trials of antipsychotic monotherapy by history:  No Recommended Plan for Multiple Antipsychotic Therapies:  N/A  Discharge Orders   Future Orders Complete By Expires     Activity as tolerated - No restrictions  As directed     Diet - low sodium heart healthy  As directed         Medication List    STOP taking these medications       clonazePAM 0.5 MG tablet  Commonly known as:  KLONOPIN      TAKE these medications     Indication   ARIPiprazole 5 MG tablet  Commonly known as:  ABILIFY  Take 1 tablet (5 mg total) by mouth at bedtime.   Indication:  Major Depressive Disorder     buPROPion 150 MG 24 hr tablet  Commonly known as:  WELLBUTRIN XL  Take 1 tablet (150 mg total) by mouth daily.   Indication:  Major Depressive  Disorder     escitalopram 20 MG tablet  Commonly known as:  LEXAPRO  Take 1 tablet (20 mg total) by mouth daily.   Indication:  Depression     estradiol 2 MG tablet  Commonly known as:  ESTRACE  Take 1 tablet (2 mg total) by mouth 3 (three) times daily.   Indication:  part of hormonal treatment for transgender process     finasteride 5 MG tablet  Commonly known as:  PROSCAR  Take 1 tablet (5 mg total) by mouth daily.   Indication:  part of his hormonal therapy for transgendering     spironolactone 50 MG tablet  Commonly known as:  ALDACTONE  Take 1 tablet (50 mg total) by mouth daily.   Indication:  Edema           Follow-up Information   Follow up with [facility] On 09/15/2012. ( Please go to [facility]'s walk in clinic on Thursday, Sep 15, 2012 or any weekday between 8AM-3PM)    Contact information:   [address] [place], [place]   [address]  [phone]      Follow-up recommendations:  Activity:  As tolerated Diet:  Low-sodium heart healthy diet  Comments:  Patient will continue her care at [facility].  Total Discharge Time:  Greater than 30 minutes.  Signed: Nanine Means, PMH-NP  09/15/2012, 9:11 AM

## 2012-09-19 NOTE — Progress Notes (Signed)
Patient Discharge Instructions:  No Documentation Sent.  There is no ROI signed for this patient.  The patient left and there is no working phone per HN.  Randy [name], 09/19/2012, 2:09 PM

## 2012-10-23 ENCOUNTER — Emergency Department (HOSPITAL_COMMUNITY)
Admission: EM | Admit: 2012-10-23 | Discharge: 2012-10-24 | Disposition: A | Discharge status: Other Acute Inpatient Hospital | Payer: Medicare Other | Source: Home / Self Care | Attending: Emergency Medicine | Admitting: Emergency Medicine

## 2012-10-23 ENCOUNTER — Ambulatory Visit (HOSPITAL_COMMUNITY)
Admission: AD | Admit: 2012-10-23 | Discharge: 2012-10-23 | Disposition: A | Discharge status: Home / Self Care | Payer: Medicare Other | Attending: Psychiatry | Admitting: Psychiatry

## 2012-10-23 ENCOUNTER — Encounter (HOSPITAL_COMMUNITY): Payer: Self-pay | Admitting: *Deleted

## 2012-10-23 DIAGNOSIS — F1410 Cocaine abuse, uncomplicated: Secondary | ICD-10-CM

## 2012-10-23 DIAGNOSIS — F329 Major depressive disorder, single episode, unspecified: Secondary | ICD-10-CM

## 2012-10-23 DIAGNOSIS — F32A Depression, unspecified: Secondary | ICD-10-CM

## 2012-10-23 DIAGNOSIS — F1420 Cocaine dependence, uncomplicated: Secondary | ICD-10-CM

## 2012-10-23 DIAGNOSIS — F1110 Opioid abuse, uncomplicated: Secondary | ICD-10-CM

## 2012-10-23 DIAGNOSIS — F17200 Nicotine dependence, unspecified, uncomplicated: Secondary | ICD-10-CM

## 2012-10-23 DIAGNOSIS — F1994 Other psychoactive substance use, unspecified with psychoactive substance-induced mood disorder: Secondary | ICD-10-CM

## 2012-10-23 DIAGNOSIS — F1910 Other psychoactive substance abuse, uncomplicated: Secondary | ICD-10-CM

## 2012-10-23 LAB — CBC
HCT: 45.2 % (ref 39.0–52.0)
Hemoglobin: 15.1 g/dL (ref 13.0–17.0)
MCH: 30.9 pg (ref 26.0–34.0)
MCHC: 33.4 g/dL (ref 30.0–36.0)
RDW: 13.1 % (ref 11.5–15.5)

## 2012-10-23 LAB — COMPREHENSIVE METABOLIC PANEL
Albumin: 3.6 g/dL (ref 3.5–5.2)
BUN: 12 mg/dL (ref 6–23)
Calcium: 9.3 mg/dL (ref 8.4–10.5)
GFR calc Af Amer: >90 mL/min (ref >90)
Glucose, Bld: 103 mg/dL — ABNORMAL HIGH (ref 70–99)
Potassium: 4.1 mEq/L (ref 3.5–5.1)
Total Protein: 7.1 g/dL (ref 6.0–8.3)

## 2012-10-23 LAB — RAPID URINE DRUG SCREEN, HOSP PERFORMED
Amphetamines: NOT DETECTED
Barbiturates: NOT DETECTED
Benzodiazepines: NOT DETECTED
Cocaine: POSITIVE — AB
Opiates: NOT DETECTED
Tetrahydrocannabinol: NOT DETECTED

## 2012-10-23 LAB — ACETAMINOPHEN LEVEL: Acetaminophen (Tylenol), Serum: <15 ug/mL (ref 10–30)

## 2012-10-23 LAB — SALICYLATE LEVEL: Salicylate Lvl: <2 mg/dL — ABNORMAL LOW (ref 2.8–20.0)

## 2012-10-23 LAB — ETHANOL: Alcohol, Ethyl (B): <11 mg/dL (ref 0–11)

## 2012-10-23 MED ORDER — ACETAMINOPHEN 325 MG PO TABS: 650.0000 mg | ORAL_TABLET | ORAL | Status: DC | PRN

## 2012-10-23 MED ORDER — BUPROPION HCL ER (XL) 150 MG PO TB24
150.0000 mg | ORAL_TABLET | Freq: Every day | ORAL | Status: DC
  Administered 2012-10-23 – 2012-10-24 (×2): 150 mg via ORAL
  Filled 2012-10-23 (×2): qty 1

## 2012-10-23 MED ORDER — ZOLPIDEM TARTRATE 5 MG PO TABS: 5.0000 mg | ORAL_TABLET | Freq: Every evening | ORAL | Status: DC | PRN

## 2012-10-23 MED ORDER — ARIPIPRAZOLE 5 MG PO TABS
5.0000 mg | ORAL_TABLET | Freq: Every day | ORAL | Status: DC
  Administered 2012-10-23 – 2012-10-24 (×2): 5 mg via ORAL
  Filled 2012-10-23 (×2): qty 1

## 2012-10-23 MED ORDER — FINASTERIDE 5 MG PO TABS
5.0000 mg | ORAL_TABLET | Freq: Every day | ORAL | Status: DC
  Administered 2012-10-23 – 2012-10-24 (×2): 5 mg via ORAL
  Filled 2012-10-23 (×2): qty 1

## 2012-10-23 MED ORDER — NICOTINE 21 MG/24HR TD PT24: 21.0000 mg | MEDICATED_PATCH | Freq: Every day | TRANSDERMAL | Status: DC

## 2012-10-23 MED ORDER — ONDANSETRON HCL 4 MG PO TABS: 4.0000 mg | ORAL_TABLET | Freq: Three times a day (TID) | ORAL | Status: DC | PRN

## 2012-10-23 MED ORDER — ESTRADIOL 2 MG PO TABS
2.0000 mg | ORAL_TABLET | Freq: Every day | ORAL | Status: DC
  Administered 2012-10-23 – 2012-10-24 (×2): 2 mg via ORAL
  Filled 2012-10-23 (×2): qty 1

## 2012-10-23 MED ORDER — ESCITALOPRAM OXALATE 10 MG PO TABS
20.0000 mg | ORAL_TABLET | Freq: Every day | ORAL | Status: DC
  Administered 2012-10-23 – 2012-10-24 (×2): 20 mg via ORAL
  Filled 2012-10-23 (×2): qty 2

## 2012-10-23 MED ORDER — ALUM & MAG HYDROXIDE-SIMETH 200-200-20 MG/5ML PO SUSP: 30.0000 mL | ORAL | Status: DC | PRN

## 2012-10-23 MED ORDER — SPIRONOLACTONE 50 MG PO TABS
50.0000 mg | ORAL_TABLET | Freq: Every day | ORAL | Status: DC
  Administered 2012-10-23 – 2012-10-24 (×2): 50 mg via ORAL
  Filled 2012-10-23 (×2): qty 1

## 2012-10-23 MED ORDER — IBUPROFEN 600 MG PO TABS: 600.0000 mg | ORAL_TABLET | Freq: Three times a day (TID) | ORAL | Status: DC | PRN

## 2012-10-23 MED ORDER — LORAZEPAM 1 MG PO TABS: 1.0000 mg | ORAL_TABLET | Freq: Three times a day (TID) | ORAL | Status: DC | PRN

## 2012-10-23 NOTE — ED Provider Notes (Signed)
History     CSN: [id number]  Arrival date & time 10/23/12  1344   First MD Initiated Contact with Patient 10/23/12 1413      Chief Complaint  Patient presents with  . Medical Clearance    (Consider location/radiation/quality/duration/timing/severity/associated sxs/prior treatment) HPI  Patient is a 42 year old transgendered male who identifies as male past medical history significant for depression, hepatitis C, hormonal imbalance in transgender patient, and polysubstance abuse as a the emergency department for increased hopelessness and seeking detox from cocaine and heroin use. Patient states he has become increasingly more depressed over the last few weeks which has been worsened with his recreational drug use. Patient states his last cocaine use was yesterday on his last heroin use was in the last 48 hours. Patient has suicidal ideations with plan to overdose but denies history of attempts in the past. Patient denies any homicidal ideation, hallucinations. Patient complains of mild headache and tremors.   Past Medical History  Diagnosis Date  . Depression   . Hepatitis C   . 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 Cellulitis  Of  Left Forearm 01/10/2012    From IVDA    Past Surgical History  Procedure Laterality Date  . I&d extremity  01/13/2012    Procedure: IRRIGATION AND DEBRIDEMENT EXTREMITY;  Surgeon: Kennieth Rad, MD;  Location: [facility] ORS;  Service: Orthopedics;  Laterality: Left;    Family History  Problem Relation Age of Onset  . Idiopathic pulmonary fibrosis Father     History  Substance Use Topics  . Smoking status: Current Every Day Smoker -- 1.50 packs/day for 20 years    Types: Cigarettes  . Smokeless tobacco: Never Used  . Alcohol Use: 0.0 oz/week     Comment: once every 1-2 months      Review of Systems  Constitutional: Positive for chills.  Eyes: Negative.   Respiratory: Negative for shortness of breath.   Cardiovascular: Negative  for chest pain.  Gastrointestinal: Negative for nausea and vomiting.  Genitourinary: Negative.   Musculoskeletal: Negative.   Skin: Negative.   Neurological: Positive for headaches.  Psychiatric/Behavioral: Positive for suicidal ideas.    Allergies  Penicillins  Home Medications   Current Outpatient Rx  Name  Route  Sig  Dispense  Refill  . ARIPiprazole (ABILIFY) 5 MG tablet   Oral   Take 1 tablet (5 mg total) by mouth at bedtime.   30 tablet   0   . buPROPion (WELLBUTRIN XL) 150 MG 24 hr tablet   Oral   Take 1 tablet (150 mg total) by mouth daily.   30 tablet   0   . escitalopram (LEXAPRO) 20 MG tablet   Oral   Take 1 tablet (20 mg total) by mouth daily.   30 tablet   0   . estradiol (ESTRACE) 2 MG tablet   Oral   Take 1 tablet (2 mg total) by mouth 3 (three) times daily.   90 tablet   0   . finasteride (PROSCAR) 5 MG tablet   Oral   Take 1 tablet (5 mg total) by mouth daily.   30 tablet   0   . spironolactone (ALDACTONE) 50 MG tablet   Oral   Take 1 tablet (50 mg total) by mouth daily.   30 tablet   0     BP 122/78  Pulse 77  Temp(Src) 98.6 F (37 C) (Oral)  Resp 18  SpO2 100%  Physical Exam  Constitutional: He  is oriented to person, place, and time. He appears well-developed and well-nourished.  HENT:  Head: Normocephalic and atraumatic.  Mouth/Throat: Oropharynx is clear and moist.  Eyes: Conjunctivae are normal.  Neck: Neck supple.  Cardiovascular: Normal rate, regular rhythm and normal heart sounds.   Pulmonary/Chest: Breath sounds normal.  Abdominal: Soft. There is no tenderness.  Musculoskeletal: Normal range of motion.  Neurological: He is alert and oriented to person, place, and time.  Skin: Skin is warm and dry.  Psychiatric: His speech is normal and behavior is normal. Cognition and memory are normal. He exhibits a depressed mood. He expresses suicidal ideation. He expresses no homicidal ideation. He expresses suicidal plans. He  expresses no homicidal plans.    ED Course  Procedures (including critical care time)  ACT and telepsych consulted.  Psych hold orders placed.   Labs Reviewed  COMPREHENSIVE METABOLIC PANEL - Abnormal; Notable for the following:    Glucose, Bld 103 (*)    Total Bilirubin 0.2 (*)    All other components within normal limits  SALICYLATE LEVEL - Abnormal; Notable for the following:    Salicylate Lvl <2.0 (*)    All other components within normal limits  URINE RAPID DRUG SCREEN (HOSP PERFORMED) - Abnormal; Notable for the following:    Cocaine POSITIVE (*)    All other components within normal limits  ACETAMINOPHEN LEVEL  CBC  ETHANOL   No results found.   1. Depression   2. Cocaine abuse   3. Heroin abuse       MDM  Pt presents to the ED for medical clearance.  Pt is currently having SI ideations. Pt has a plan to OD. The patients demeanor is dysphoric. Admits difficulty sleeping, loss of interests, feelings of worthlessness, lack of energy, difficulty concentrating, loss of appetite, feelings of anxiety. Pt also states recent cocaine and heroin use w/in 48 hours and is requesting detox. Pt was evaluated by [facility] and is awaiting medical clearance to be able to start application for acceptance at [facility] for detox process. The patient currently does not have any acute physical complaints and is in no acute distress. The patient was brought to ED by self an is seeking voluntary assistance for medical clearance for possible placement. ACT consult was appreciated and pt was moved to Psych ED for further evaluation. Patient d/w with Dr. Freida Busman, agrees with plan. Dr. Ethelda Chick has been made aware of pending labs and plan for patient. Stable at time of shift change.              Jeannetta Ellis, PA-C 10/23/12 1532

## 2012-10-23 NOTE — Progress Notes (Signed)
[facility] MD Progress Note  10/23/2012 10:55 PM Randy [name]  MRN:  [id number] Subjective: 2nd presentation in past 40 days for cocaine addiction with associated depression .   Diagnosis:  Axis I: cocaine dependency with substance induced mood disorder/Polysubstance abuse/Nicotene Dependency                      Axis II Deferred                      Axis III- Hep C/Transgender hormonal imbalance                      Axis IV- Chronic relapse/Never been to treatment and willing to try                      Axis V-GAF 50-60 ADL's:  Intact  Sleep: Poor  Appetite:  Fair  Suicidal Ideation:  Intent:  none Homicidal Ideation:  Intent:  none AEB (as evidenced by):  Psychiatric Specialty Exam: Review of Systems  Constitutional: Positive for malaise/fatigue.  Gastrointestinal:       +Hep c  Genitourinary:       Transgender  Psychiatric/Behavioral: Positive for depression, suicidal ideas and substance abuse. Negative for hallucinations and memory loss.       Pt in despair over cocaine dependence/polysubstance abuse   All other systems reviewed and are negative.    Blood pressure 110/67, pulse 67, temperature 98.5 F (36.9 C), temperature source Oral, resp. rate 16, SpO2 99.00%.There is no weight on file to calculate BMI.  General Appearance: Disheveled  Eye Solicitor::  Fair  Speech:  soft voice/hesitancy  Volume:  Decreased  Mood:  Dysphoric  Affect:  Congruent  Thought Process:  Coherent  Orientation:  Full (Time, Place, and Person)  Thought Content:  Rumination  Suicidal Thoughts:  Yes.  without intent/plan on presentation.Feeling better now.  Homicidal Thoughts:  No  Memory:  Negative  Judgement:  Impaired  Insight:  Lacking  Psychomotor Activity:  Normal  Concentration:  Good  Recall:  Good  Akathisia:  Negative  Handed:  Right  AIMS (if indicated):     Assets:  Desire for Improvement Resilience  Sleep:      Current Medications: Current Facility-Administered Medications   Medication Dose Route Frequency Provider Last Rate Last Dose  . acetaminophen (TYLENOL) tablet 650 mg  650 mg Oral Q4H PRN Jennifer L Piepenbrink, PA-C      . alum & mag hydroxide-simeth (MAALOX/MYLANTA) 200-200-20 MG/5ML suspension 30 mL  30 mL Oral PRN Jennifer L Piepenbrink, PA-C      . ARIPiprazole (ABILIFY) tablet 5 mg  5 mg Oral Daily Jennifer L Piepenbrink, PA-C   5 mg at 10/23/12 1630  . buPROPion (WELLBUTRIN XL) 24 hr tablet 150 mg  150 mg Oral Daily Jennifer L Piepenbrink, PA-C   150 mg at 10/23/12 1630  . escitalopram (LEXAPRO) tablet 20 mg  20 mg Oral Daily Jennifer L Piepenbrink, PA-C   20 mg at 10/23/12 1630  . estradiol (ESTRACE) tablet 2 mg  2 mg Oral Daily Jennifer L Piepenbrink, PA-C   2 mg at 10/23/12 1630  . finasteride (PROSCAR) tablet 5 mg  5 mg Oral Daily Jennifer L Piepenbrink, PA-C   5 mg at 10/23/12 1631  . ibuprofen (ADVIL,MOTRIN) tablet 600 mg  600 mg Oral Q8H PRN Jennifer L Piepenbrink, PA-C      . LORazepam (ATIVAN) tablet 1 mg  1  mg Oral Q8H PRN Jennifer L Piepenbrink, PA-C      . nicotine (NICODERM CQ - dosed in mg/24 hours) patch 21 mg  21 mg Transdermal Daily Jennifer L Piepenbrink, PA-C      . ondansetron (ZOFRAN) tablet 4 mg  4 mg Oral Q8H PRN Jennifer L Piepenbrink, PA-C      . spironolactone (ALDACTONE) tablet 50 mg  50 mg Oral Daily Jennifer L Piepenbrink, PA-C   50 mg at 10/23/12 1631  . zolpidem (AMBIEN) tablet 5 mg  5 mg Oral QHS PRN Jeannetta Ellis, PA-C       Current Outpatient Prescriptions  Medication Sig Dispense Refill  . ARIPiprazole (ABILIFY) 5 MG tablet Take 1 tablet (5 mg total) by mouth at bedtime.  30 tablet  0  . buPROPion (WELLBUTRIN XL) 150 MG 24 hr tablet Take 1 tablet (150 mg total) by mouth daily.  30 tablet  0  . escitalopram (LEXAPRO) 20 MG tablet Take 1 tablet (20 mg total) by mouth daily.  30 tablet  0  . estradiol (ESTRACE) 2 MG tablet Take 1 tablet (2 mg total) by mouth 3 (three) times daily.  90 tablet  0  . finasteride  (PROSCAR) 5 MG tablet Take 1 tablet (5 mg total) by mouth daily.  30 tablet  0  . spironolactone (ALDACTONE) 50 MG tablet Take 1 tablet (50 mg total) by mouth daily.  30 tablet  0    Lab Results:  Results for orders placed during the hospital encounter of 10/23/12 (from the past 48 hour(s))  URINE RAPID DRUG SCREEN (HOSP PERFORMED)     Status: Abnormal   Collection Time    10/23/12  2:00 PM      Result Value Range   Opiates NONE DETECTED  NONE DETECTED   Cocaine POSITIVE (*) NONE DETECTED   Benzodiazepines NONE DETECTED  NONE DETECTED   Amphetamines NONE DETECTED  NONE DETECTED   Tetrahydrocannabinol NONE DETECTED  NONE DETECTED   Barbiturates NONE DETECTED  NONE DETECTED   Comment:            DRUG SCREEN FOR MEDICAL PURPOSES     ONLY.  IF CONFIRMATION IS NEEDED     FOR ANY PURPOSE, NOTIFY LAB     WITHIN 5 DAYS.                LOWEST DETECTABLE LIMITS     FOR URINE DRUG SCREEN     Drug Class       Cutoff (ng/mL)     Amphetamine      1000     Barbiturate      200     Benzodiazepine   200     Tricyclics       300     Opiates          300     Cocaine          300     THC              50  ACETAMINOPHEN LEVEL     Status: None   Collection Time    10/23/12  2:21 PM      Result Value Range   Acetaminophen (Tylenol), Serum <15.0  10 - 30 ug/mL   Comment:            THERAPEUTIC CONCENTRATIONS VARY     SIGNIFICANTLY. A RANGE OF 10-30     ug/mL MAY BE AN EFFECTIVE  CONCENTRATION FOR MANY PATIENTS.     HOWEVER, SOME ARE BEST TREATED     AT CONCENTRATIONS OUTSIDE THIS     RANGE.     ACETAMINOPHEN CONCENTRATIONS     >150 ug/mL AT 4 HOURS AFTER     INGESTION AND >50 ug/mL AT 12     HOURS AFTER INGESTION ARE     OFTEN ASSOCIATED WITH TOXIC     REACTIONS.  CBC     Status: None   Collection Time    10/23/12  2:21 PM      Result Value Range   WBC 8.6  4.0 - 10.5 K/uL   RBC 4.89  4.22 - 5.81 MIL/uL   Hemoglobin 15.1  13.0 - 17.0 g/dL   HCT 16.1  09.6 - 04.5 %   MCV 92.4   78.0 - 100.0 fL   MCH 30.9  26.0 - 34.0 pg   MCHC 33.4  30.0 - 36.0 g/dL   RDW 40.9  81.1 - 91.4 %   Platelets 198  150 - 400 K/uL  COMPREHENSIVE METABOLIC PANEL     Status: Abnormal   Collection Time    10/23/12  2:21 PM      Result Value Range   Sodium 140  135 - 145 mEq/L   Potassium 4.1  3.5 - 5.1 mEq/L   Chloride 108  96 - 112 mEq/L   CO2 25  19 - 32 mEq/L   Glucose, Bld 103 (*) 70 - 99 mg/dL   BUN 12  6 - 23 mg/dL   Creatinine, Ser 7.82  0.50 - 1.35 mg/dL   Calcium 9.3  8.4 - 95.6 mg/dL   Total Protein 7.1  6.0 - 8.3 g/dL   Albumin 3.6  3.5 - 5.2 g/dL   AST 26  0 - 37 U/L   ALT 43  0 - 53 U/L   Alkaline Phosphatase 66  39 - 117 U/L   Total Bilirubin 0.2 (*) 0.3 - 1.2 mg/dL   GFR calc non Af Amer >90  >90 mL/min   GFR calc Af Amer >90  >90 mL/min   Comment:            The eGFR has been calculated     using the CKD EPI equation.     This calculation has not been     validated in all clinical     situations.     eGFR's persistently     <90 mL/min signify     possible Chronic Kidney Disease.  ETHANOL     Status: None   Collection Time    10/23/12  2:21 PM      Result Value Range   Alcohol, Ethyl (B) <11  0 - 11 mg/dL   Comment:            LOWEST DETECTABLE LIMIT FOR     SERUM ALCOHOL IS 11 mg/dL     FOR MEDICAL PURPOSES ONLY  SALICYLATE LEVEL     Status: Abnormal   Collection Time    10/23/12  2:21 PM      Result Value Range   Salicylate Lvl <2.0 (*) 2.8 - 20.0 mg/dL    Physical Findings: AIMS:  , ,  ,  ,    CIWA:  CIWA-Ar Total: 3 COWS:  COWS Total Score: 2  Treatment Plan Summary: Daily contact with patient to assess and evaluate symptoms and progress in treatment  Plan:Pt reports feeling better  and that he would be  willing to go to treatment at [facility]  Informed and will seek placement  Medical Decision Making Problem Points:  Established problem, worsening (2) Data Points:  Review and summation of old records (2)  I certify that inpatient  services furnished can reasonably be expected to improve the patient's condition.   Court Joy 10/23/2012, 10:55 PM

## 2012-10-23 NOTE — ED Provider Notes (Signed)
Medical screening examination/treatment/procedure(s) were performed by non-physician practitioner and as supervising physician I was immediately available for consultation/collaboration.  Alliana Mcauliff T Natalee Tomkiewicz, MD 10/23/12 1536 
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
## 2012-10-23 NOTE — ED Notes (Signed)
Pt is a transgendered male who identifies as a male.  Pt presents to the ED from [facility] where she has been accepted, but there are no beds.  So he was sent to the ED.  Pt is seeking detox from cocaine, which he last used yesterday and heroin, which he last used 48 hours ago.  Pt states he is depressed.  Pt denies HI, but states he has had thoughts of hurting himself.  Pt denies having a plan to do so.  Pt was an IP at [facility] for same issues @ 6 weeks ago.

## 2012-10-23 NOTE — BH Assessment (Signed)
[facility] Assessment Progress Note    Pt was assessed at [facility] and at [facility] for medical clearance.  [facility] does not need to evaluate today (10-23-12)

## 2012-10-24 ENCOUNTER — Inpatient Hospital Stay (HOSPITAL_COMMUNITY)
Admission: AD | Admit: 2012-10-24 | Discharge: 2012-10-28 | DRG: 885 | Disposition: A | Discharge status: Home / Self Care | Payer: Medicare Other | Source: Intra-hospital | Attending: Psychiatry | Admitting: Psychiatry

## 2012-10-24 ENCOUNTER — Encounter (HOSPITAL_COMMUNITY): Payer: Self-pay

## 2012-10-24 DIAGNOSIS — R45851 Suicidal ideations: Secondary | ICD-10-CM

## 2012-10-24 DIAGNOSIS — F329 Major depressive disorder, single episode, unspecified: Secondary | ICD-10-CM | POA: Diagnosis not present

## 2012-10-24 DIAGNOSIS — F339 Major depressive disorder, recurrent, unspecified: Secondary | ICD-10-CM

## 2012-10-24 DIAGNOSIS — F1410 Cocaine abuse, uncomplicated: Secondary | ICD-10-CM

## 2012-10-24 DIAGNOSIS — Z79899 Other long term (current) drug therapy: Secondary | ICD-10-CM

## 2012-10-24 DIAGNOSIS — F419 Anxiety disorder, unspecified: Secondary | ICD-10-CM

## 2012-10-24 DIAGNOSIS — Z720 Tobacco use: Secondary | ICD-10-CM

## 2012-10-24 DIAGNOSIS — F1920 Other psychoactive substance dependence, uncomplicated: Secondary | ICD-10-CM | POA: Diagnosis present

## 2012-10-24 DIAGNOSIS — F3130 Bipolar disorder, current episode depressed, mild or moderate severity, unspecified: Principal | ICD-10-CM | POA: Diagnosis present

## 2012-10-24 DIAGNOSIS — B1920 Unspecified viral hepatitis C without hepatic coma: Secondary | ICD-10-CM

## 2012-10-24 DIAGNOSIS — F17200 Nicotine dependence, unspecified, uncomplicated: Secondary | ICD-10-CM

## 2012-10-24 DIAGNOSIS — F411 Generalized anxiety disorder: Secondary | ICD-10-CM

## 2012-10-24 MED ORDER — BUPROPION HCL ER (XL) 150 MG PO TB24
150.0000 mg | ORAL_TABLET | Freq: Every day | ORAL | Status: DC
  Administered 2012-10-25 – 2012-10-28 (×4): 150 mg via ORAL
  Filled 2012-10-24: qty 1
  Filled 2012-10-24: qty 14
  Filled 2012-10-24 (×4): qty 1

## 2012-10-24 MED ORDER — ARIPIPRAZOLE 5 MG PO TABS
5.0000 mg | ORAL_TABLET | Freq: Every day | ORAL | Status: DC
  Administered 2012-10-25 – 2012-10-28 (×4): 5 mg via ORAL
  Filled 2012-10-24 (×2): qty 1
  Filled 2012-10-24: qty 14
  Filled 2012-10-24 (×3): qty 1

## 2012-10-24 MED ORDER — ESCITALOPRAM OXALATE 20 MG PO TABS
20.0000 mg | ORAL_TABLET | Freq: Every day | ORAL | Status: DC
  Administered 2012-10-25 – 2012-10-28 (×4): 20 mg via ORAL
  Filled 2012-10-24: qty 14
  Filled 2012-10-24 (×5): qty 1

## 2012-10-24 MED ORDER — SPIRONOLACTONE 50 MG PO TABS
50.0000 mg | ORAL_TABLET | Freq: Every day | ORAL | Status: DC
  Administered 2012-10-25 – 2012-10-28 (×4): 50 mg via ORAL
  Filled 2012-10-24 (×6): qty 1

## 2012-10-24 MED ORDER — ACETAMINOPHEN 325 MG PO TABS: 650.0000 mg | ORAL_TABLET | Freq: Four times a day (QID) | ORAL | Status: DC | PRN

## 2012-10-24 MED ORDER — FINASTERIDE 5 MG PO TABS
5.0000 mg | ORAL_TABLET | Freq: Every day | ORAL | Status: DC
  Administered 2012-10-25 – 2012-10-28 (×4): 5 mg via ORAL
  Filled 2012-10-24 (×6): qty 1

## 2012-10-24 MED ORDER — MAGNESIUM HYDROXIDE 400 MG/5ML PO SUSP: 30.0000 mL | Freq: Every day | ORAL | Status: DC | PRN

## 2012-10-24 MED ORDER — NICOTINE 21 MG/24HR TD PT24
21.0000 mg | MEDICATED_PATCH | Freq: Every day | TRANSDERMAL | Status: DC
  Administered 2012-10-24 – 2012-10-28 (×5): 21 mg via TRANSDERMAL
  Filled 2012-10-24 (×9): qty 1

## 2012-10-24 MED ORDER — ESTRADIOL 2 MG PO TABS
2.0000 mg | ORAL_TABLET | Freq: Every day | ORAL | Status: DC
  Administered 2012-10-25: 2 mg via ORAL
  Filled 2012-10-24 (×3): qty 1

## 2012-10-24 MED ORDER — ALUM & MAG HYDROXIDE-SIMETH 200-200-20 MG/5ML PO SUSP: 30.0000 mL | ORAL | Status: DC | PRN

## 2012-10-24 NOTE — Progress Notes (Signed)
D: Patient resting in bed with eyes closed.  Respirations even and unlabored.  Patient appears to be in no apparent distress. A: Staff to monitor Q 15 mins for safety.   R:Patient remains safe on the unit.  

## 2012-10-24 NOTE — Progress Notes (Signed)
[facility] MD Progress Note  10/24/2012 12:34 PM Randy [name] [name]  MRN:  [id number] Subjective:  [name] who was interviewed this am with Dr Laury Deep  Was alert and responded to questions about his visit to the hospital.  Patient is homeless and has ran out of his medications.  He was reluctant to answer questions about suicide and discharge.  One time he stated he is not suicidal and at another time he did not answer but said to this writer"I will be fine".  He had poor eye contact, speech was low tone and volume.   He reports needing long term treatment facility for about 30 days and while in there he plans to look for permanent home.  He is waiting for admission bed in our inpatient unit or bed available at old [facility]. Diagnosis:   Axis I: Generalized Anxiety Disorder and Major Depression, Recurrent severe Axis II: Deferred Axis III:  Past Medical History  Diagnosis Date  . Depression   . Hepatitis C   . 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 Cellulitis  Of  Left Forearm 01/10/2012    From [facility]   Axis IV: housing problems, other psychosocial or environmental problems and problems related to social environment Axis V: 51-60 moderate symptoms  ADL's:  Impaired  Sleep: Fair  Appetite:  Fair  Suicidal Ideation:  Plan:  none, Vagues answers Intent:  none Means:  none Homicidal Ideation:  Plan:  none Intent:  none Means:  none AEB (as evidenced by):  Psychiatric Specialty Exam: Review of Systems  Constitutional: Positive for malaise/fatigue (C/O tiredness.). Negative for fever, chills, weight loss and diaphoresis.  HENT: Negative.  Negative for hearing loss, ear pain, nosebleeds, congestion, sore throat, neck pain, tinnitus and ear discharge.   Eyes: Negative.  Negative for blurred vision, double vision, photophobia, pain, discharge and redness.  Respiratory: Positive for cough. Negative for hemoptysis, sputum production, shortness of breath, wheezing and stridor.    Cardiovascular: Negative.  Negative for chest pain, palpitations, orthopnea, claudication, leg swelling and PND.  Gastrointestinal: Negative.  Negative for heartburn, nausea, vomiting, abdominal pain, diarrhea, constipation, blood in stool and melena.  Genitourinary: Negative.  Negative for dysuria, urgency, frequency, hematuria and flank pain.  Musculoskeletal: Negative.  Negative for myalgias, back pain, joint pain and falls.  Skin: Negative.  Negative for itching and rash.  Neurological: Negative.  Negative for dizziness, tingling, tremors, sensory change, speech change, focal weakness, seizures, loss of consciousness, weakness and headaches.  Endo/Heme/Allergies: Negative.  Negative for environmental allergies and polydipsia. Does not bruise/bleed easily.  Psychiatric/Behavioral: Positive for depression (Depressed and is stabilized on medication), suicidal ideas (Gives vague answers for suicide.  Once stated no and another time stated " Iam ok") and substance abuse (Seeking detox from alcohol and cocaine.  Reported seeking treatment at a long term facility). Negative for hallucinations and memory loss. The patient is nervous/anxious (Hx of anxiety, stabilized on medication). The patient does not have insomnia.     Blood pressure 103/65, pulse 78, temperature 97.9 F (36.6 C), temperature source Oral, resp. rate 16, SpO2 99.00%.There is no weight on file to calculate BMI.  General Appearance: Casual and Disheveled  Eye Contact::  Poor  Speech:  Normal Rate  Volume:  Normal  Mood:  Depressed and Dysphoric  Affect:  Blunt and Depressed  Thought Process:  Linear  Orientation:  Full (Time, Place, and Person)  Thought Content:  WDL  Suicidal Thoughts:  Yes.  without intent/plan  Homicidal Thoughts:  No  Memory:  Immediate;   Fair Recent;   Fair Remote;   Fair  Judgement:  Poor  Insight:  Fair and Shallow  Psychomotor Activity:  Normal  Concentration:  Good  Recall:  Fair  Akathisia:  NA   Handed:  Right  AIMS (if indicated):     Assets:  Desire for Improvement  Sleep:      Current Medications: Current Facility-Administered Medications  Medication Dose Route Frequency Provider Last Rate Last Dose  . acetaminophen (TYLENOL) tablet 650 mg  650 mg Oral Q4H PRN Jennifer L Piepenbrink, PA-C      . alum & mag hydroxide-simeth (MAALOX/MYLANTA) 200-200-20 MG/5ML suspension 30 mL  30 mL Oral PRN Jennifer L Piepenbrink, PA-C      . ARIPiprazole (ABILIFY) tablet 5 mg  5 mg Oral Daily Jennifer L Piepenbrink, PA-C   5 mg at 10/24/12 1040  . buPROPion (WELLBUTRIN XL) 24 hr tablet 150 mg  150 mg Oral Daily Jennifer L Piepenbrink, PA-C   150 mg at 10/24/12 1040  . escitalopram (LEXAPRO) tablet 20 mg  20 mg Oral Daily Jennifer L Piepenbrink, PA-C   20 mg at 10/24/12 1040  . estradiol (ESTRACE) tablet 2 mg  2 mg Oral Daily Jennifer L Piepenbrink, PA-C   2 mg at 10/24/12 1039  . finasteride (PROSCAR) tablet 5 mg  5 mg Oral Daily Jennifer L Piepenbrink, PA-C   5 mg at 10/24/12 1040  . ibuprofen (ADVIL,MOTRIN) tablet 600 mg  600 mg Oral Q8H PRN Jennifer L Piepenbrink, PA-C      . LORazepam (ATIVAN) tablet 1 mg  1 mg Oral Q8H PRN Jennifer L Piepenbrink, PA-C      . nicotine (NICODERM CQ - dosed in mg/24 hours) patch 21 mg  21 mg Transdermal Daily Jennifer L Piepenbrink, PA-C      . ondansetron (ZOFRAN) tablet 4 mg  4 mg Oral Q8H PRN Jennifer L Piepenbrink, PA-C      . spironolactone (ALDACTONE) tablet 50 mg  50 mg Oral Daily Jennifer L Piepenbrink, PA-C   50 mg at 10/24/12 1040  . zolpidem (AMBIEN) tablet 5 mg  5 mg Oral QHS PRN Jeannetta Ellis, PA-C       Current Outpatient Prescriptions  Medication Sig Dispense Refill  . ARIPiprazole (ABILIFY) 5 MG tablet Take 1 tablet (5 mg total) by mouth at bedtime.  30 tablet  0  . buPROPion (WELLBUTRIN XL) 150 MG 24 hr tablet Take 1 tablet (150 mg total) by mouth daily.  30 tablet  0  . escitalopram (LEXAPRO) 20 MG tablet Take 1 tablet (20 mg  total) by mouth daily.  30 tablet  0  . estradiol (ESTRACE) 2 MG tablet Take 1 tablet (2 mg total) by mouth 3 (three) times daily.  90 tablet  0  . finasteride (PROSCAR) 5 MG tablet Take 1 tablet (5 mg total) by mouth daily.  30 tablet  0  . spironolactone (ALDACTONE) 50 MG tablet Take 1 tablet (50 mg total) by mouth daily.  30 tablet  0    Lab Results:  Results for orders placed during the hospital encounter of 10/23/12 (from the past 48 hour(s))  URINE RAPID DRUG SCREEN (HOSP PERFORMED)     Status: Abnormal   Collection Time    10/23/12  2:00 PM      Result Value Range   Opiates NONE DETECTED  NONE DETECTED   Cocaine POSITIVE (*) NONE DETECTED   Benzodiazepines NONE DETECTED  NONE DETECTED  Amphetamines NONE DETECTED  NONE DETECTED   Tetrahydrocannabinol NONE DETECTED  NONE DETECTED   Barbiturates NONE DETECTED  NONE DETECTED   Comment:            DRUG SCREEN FOR MEDICAL PURPOSES     ONLY.  IF CONFIRMATION IS NEEDED     FOR ANY PURPOSE, NOTIFY LAB     WITHIN 5 DAYS.                LOWEST DETECTABLE LIMITS     FOR URINE DRUG SCREEN     Drug Class       Cutoff (ng/mL)     Amphetamine      1000     Barbiturate      200     Benzodiazepine   200     Tricyclics       300     Opiates          300     Cocaine          300     THC              50  ACETAMINOPHEN LEVEL     Status: None   Collection Time    10/23/12  2:21 PM      Result Value Range   Acetaminophen (Tylenol), Serum <15.0  10 - 30 ug/mL   Comment:            THERAPEUTIC CONCENTRATIONS VARY     SIGNIFICANTLY. A RANGE OF 10-30     ug/mL MAY BE AN EFFECTIVE     CONCENTRATION FOR MANY PATIENTS.     HOWEVER, SOME ARE BEST TREATED     AT CONCENTRATIONS OUTSIDE THIS     RANGE.     ACETAMINOPHEN CONCENTRATIONS     >150 ug/mL AT 4 HOURS AFTER     INGESTION AND >50 ug/mL AT 12     HOURS AFTER INGESTION ARE     OFTEN ASSOCIATED WITH TOXIC     REACTIONS.  CBC     Status: None   Collection Time    10/23/12  2:21 PM       Result Value Range   WBC 8.6  4.0 - 10.5 K/uL   RBC 4.89  4.22 - 5.81 MIL/uL   Hemoglobin 15.1  13.0 - 17.0 g/dL   HCT 14.7  82.9 - 56.2 %   MCV 92.4  78.0 - 100.0 fL   MCH 30.9  26.0 - 34.0 pg   MCHC 33.4  30.0 - 36.0 g/dL   RDW 13.0  86.5 - 78.4 %   Platelets 198  150 - 400 K/uL  COMPREHENSIVE METABOLIC PANEL     Status: Abnormal   Collection Time    10/23/12  2:21 PM      Result Value Range   Sodium 140  135 - 145 mEq/L   Potassium 4.1  3.5 - 5.1 mEq/L   Chloride 108  96 - 112 mEq/L   CO2 25  19 - 32 mEq/L   Glucose, Bld 103 (*) 70 - 99 mg/dL   BUN 12  6 - 23 mg/dL   Creatinine, Ser 6.96  0.50 - 1.35 mg/dL   Calcium 9.3  8.4 - 29.5 mg/dL   Total Protein 7.1  6.0 - 8.3 g/dL   Albumin 3.6  3.5 - 5.2 g/dL   AST 26  0 - 37 U/L   ALT 43  0 - 53 U/L  Alkaline Phosphatase 66  39 - 117 U/L   Total Bilirubin 0.2 (*) 0.3 - 1.2 mg/dL   GFR calc non Af Amer >90  >90 mL/min   GFR calc Af Amer >90  >90 mL/min   Comment:            The eGFR has been calculated     using the CKD EPI equation.     This calculation has not been     validated in all clinical     situations.     eGFR's persistently     <90 mL/min signify     possible Chronic Kidney Disease.  ETHANOL     Status: None   Collection Time    10/23/12  2:21 PM      Result Value Range   Alcohol, Ethyl (B) <11  0 - 11 mg/dL   Comment:            LOWEST DETECTABLE LIMIT FOR     SERUM ALCOHOL IS 11 mg/dL     FOR MEDICAL PURPOSES ONLY  SALICYLATE LEVEL     Status: Abnormal   Collection Time    10/23/12  2:21 PM      Result Value Range   Salicylate Lvl <2.0 (*) 2.8 - 20.0 mg/dL    Physical Findings: AIMS:  , ,  ,  ,    CIWA:  CIWA-Ar Total: 3 COWS:  COWS Total Score: 2  Treatment Plan Summary: Daily contact with patient to assess and evaluate symptoms and progress in treatment Medication management  Plan:  Consult with Dr Laury Deep and face to face interview this am. Recommendation:  Will admit to our inpatient  unit or refer to any psychiatric inpatient facility for safety and stabilization.   Will initiate detoxification for alcohol using Ativan protocol and restart his other psychiatric medication.   Manage and monitor symptoms and treat to achieve base line functioning.  Medical Decision Making Problem Points:  Established problem, worsening (2) Data Points:  Review and summation of old records (2)  I certify that inpatient services furnished can reasonably be expected to improve the patient's condition.   Dahlia Byes, C   PMHNP-BC 10/24/2012, 12:34 PM  I have examined the patient, reviewed the note and agree with the above findings and plan.  Jacqulyn Cane, M.D.  10/24/2012 2:56 PM

## 2012-10-24 NOTE — ED Notes (Signed)
Report called to [facility], security to transport around 1630

## 2012-10-24 NOTE — Tx Team (Signed)
Initial Interdisciplinary Treatment Plan  PATIENT STRENGTHS: (choose at least two) Ability for insight Average or above average intelligence  PATIENT STRESSORS: Financial difficulties Legal issue Substance abuse   PROBLEM LIST: Problem List/Patient Goals Date to be addressed Date deferred Reason deferred Estimated date of resolution  Depression 10/24/12     Substance abuse 10/24/12     Goal s-coping skills for depression and substance abuse 10/1612                                          DISCHARGE CRITERIA:  Adequate post-discharge living arrangements Improved stabilization in mood, thinking, and/or behavior Motivation to continue treatment in a less acute level of care Need for constant or close observation no longer present Reduction of life-threatening or endangering symptoms to within safe limits Verbal commitment to aftercare and medication compliance Withdrawal symptoms are absent or subacute and managed without 24-hour nursing intervention  PRELIMINARY DISCHARGE PLAN: Attend 12-step recovery group Placement in alternative living arrangements  PATIENT/FAMIILY INVOLVEMENT: This treatment plan has been presented to and reviewed with the patient, Randy [name], and/or family member,   The patient and family have been given the opportunity to ask questions and make suggestions.  Randy [name] 10/24/2012, 7:17 PM

## 2012-10-24 NOTE — BHH Counselor (Signed)
TC to [facility] at [facility]. No beds currently but may have discharges later this am. Writer faxed referral to [facility].  Evette Cristal, [place] Assessment Counselor

## 2012-10-24 NOTE — ED Provider Notes (Addendum)
She is alert, and cooperative. She expresses vague suicidal ideation, but no plan. She states that she feels a little better, today. She's not currently have a psychiatrist, and missed her appointment at [facility], after her discharge from the behavioral health hospital about one month ago. She will be seen by psychiatry this morning to get help with disposition planning.  Flint Melter, MD 10/24/12 [phone]  Accepted at [facility]  Flint Melter, MD 10/24/12 [phone]

## 2012-10-24 NOTE — BHH Counselor (Signed)
6/15 Randy Morn, PA recommends referral to [facility]; however; pt reports SI. [facility] will not take patient's w/ SI.  [facility] also does not take [facility].   Pt evaluated by [name], NP 10/24/2012 and inpatient psychiatric treatment is still needed. Pt referred to [facility] pending a bed. Spoke to [facility] and he sts that discharges are expected this evening but no [facility] beds are available at this time. Pt referred to [facility] pending review.

## 2012-10-24 NOTE — Progress Notes (Signed)
Voluntary admission for a 42 y.o trans gender male to male with flat affect, depressed mood.  Pt. Reports increased depression with SI thoughts with no plan.  Reports substance abuse of Cocaine, Heroin and occasional alcohol use. Pt. Reports legal and financial problems.    Pt. Denies SI/HI and denies A/V hallucinations  At present and contracts for safety.Pt. Oriented to unit and given food and fluids.  Pt. Consumed 100% of food.

## 2012-10-24 NOTE — ED Notes (Signed)
Pt sleeping uable to get vitals

## 2012-10-24 NOTE — BHH Counselor (Signed)
Patient accepted to [facility] by [name], NP to Dr. Daleen Bo. The room assignment is 504-1.

## 2012-10-25 DIAGNOSIS — F1410 Cocaine abuse, uncomplicated: Secondary | ICD-10-CM

## 2012-10-25 MED ORDER — ESTRADIOL 2 MG PO TABS
2.0000 mg | ORAL_TABLET | Freq: Once | ORAL | Status: AC
  Administered 2012-10-25: 2 mg via ORAL
  Filled 2012-10-25: qty 1

## 2012-10-25 MED ORDER — ESTRADIOL 2 MG PO TABS
2.0000 mg | ORAL_TABLET | Freq: Three times a day (TID) | ORAL | Status: DC
  Administered 2012-10-26 – 2012-10-28 (×6): 2 mg via ORAL
  Filled 2012-10-25 (×11): qty 1
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
## 2012-10-25 NOTE — Progress Notes (Signed)
Patient ID: Randy [name], male   DOB: 01-20-1971, 42 y.o.   MRN: [id number] D- Patient reports her sleep was poor and her appetite is improving.  Her energy level is low and her ability to pay attention is  Improving.  She rates her depression at 8, and anxiety at 6.  She has been attending groups.  A- Talked with patient about her estradiol dose per pharmacy request and R-  patient confirms that she gets a total of 6 mg daily divided into 2 or 3 doses..  NP Vernona Rieger  informed.

## 2012-10-25 NOTE — [facility] Group Notes (Signed)
[facility] LCSW Aftercare Discharge Planning Group Note  10/25/2012  10:50 AM    Participation Quality:  Appropriate   Mood/Affect:  Appropriate Depressed  Depression Rating:  8  Anxiety Rating:  7  Thoughts of Suicide:  Yes  Will you contract for safety?   Yes  Current AVH:  NA  Plan for Discharge/Comments:  Patient reports admitting to the hospital with increased depression and SI. She advised of relapsing on Heroin and cocaine and being interested in residential treatment.  Transportation Means:   Patient uses public transportation.  Supports: Patient has limited support system.  Harol Shabazz, Joesph July

## 2012-10-25 NOTE — Clinical Social Work Note (Signed)
Writer spoke with Randy [name] at [place].  He advised there would not be a problems with patient admitting as a transgendered individual but would have to be housed with a male and use the male restrooms.

## 2012-10-25 NOTE — Progress Notes (Signed)
Recreation Therapy Notes  Date: 06.17.2014 Time: 2:45pm Location: [address] Dayroom      Group Topic/Focus: Animal Assist Activities/Therapy (AAA/T)  Participation Level: Did not attend.   Kiauna Zywicki L Jeovanny Cuadros, LRT/CTRS  Ikenna Ohms L 10/25/2012 4:29 PM 

## 2012-10-25 NOTE — Progress Notes (Signed)
Grief and Loss Group ° °Group members discussed significant losses in their life and shared their grief and loss experiences.  ° °Pt was present and attentive in group. Pt did not participate verbally.  ° °Isabelle Ong °Counselor Intern °[employer] °

## 2012-10-25 NOTE — [facility] Group Notes (Signed)
[facility] LCSW Group Therapy      Feelings About Diagnosis 1:15 - 2:30 PM         10/25/2012  10:53 AM    Type of Therapy:  Group Therapy  Participation Level:  Minimal  Participation Quality:  Appropriate  Affect:  Appropriate  Cognitive:  Alert and Appropriate  Insight:  Developing/Improving and Engaged  Engagement in Therapy:  Developing/Improving and Engaged  Modes of Intervention:  Discussion, Education, Exploration, Problem-Solving, Rapport Building, Support  Summary of Progress/Problems:  Patient listened attentively but did not add to the discussion.  Wynn Banker 10/25/2012  10:53 AM

## 2012-10-25 NOTE — Progress Notes (Signed)
Adult Psychoeducational Group Note  Date:  10/25/2012 Time:  12:55 PM  Group Topic/Focus:  Recovery Goals:   The focus of this group is to identify appropriate goals for recovery and establish a plan to achieve them.  Participation Level:  Minimal  Participation Quality:  Appropriate and Attentive  Affect:  Flat  Cognitive:  Appropriate  Insight: Lacking  Engagement in Group:  Lacking and Poor  Modes of Intervention:  Activity, Discussion, Socialization and Support  Additional Comments:  Patient came to group, but did not socialize or speak during the group, even when prompted. The purpose of group was to define, educate and discuss recovery and how to make recovery goals. Patient made two realistic goals for recovery and thought about how the goals were going to be achieved and how to make positive changes towards recovery.  Cathlean Cower 10/25/2012, 12:55 PM

## 2012-10-25 NOTE — [facility] Counselor (Signed)
Adult Psychosocial Assessment Update Interdisciplinary Team  [facility] admissions/discharges:  Admissions Discharges  Date: 09/12/12 Date: 09/14/12  Date: Date:  Date: Date:  Date: Date:  Date: Date:   Changes since the last Psychosocial Assessment (including adherence to outpatient mental health and/or substance abuse treatment, situational issues contributing to decompensation and/or relapse). Patient advised of admitting to the hospital due to relapsing on Heroin and crack cocaine. She has become increasing depressed and endorsing SI.  Patient is uncertain of housing situation at discharge.             Discharge Plan 1. Will you be returning to the same living situation after discharge?   Yes: No:      If no, what is your plan?    No.  Patient is uncertain of living arrangement at discharge.       2. Would you like a referral for services when you are discharged? Yes:     If yes, for what services?  No:       Yes, patient requesting referral for residential service and signed consent for referral to [facility].       Summary and Recommendations (to be completed by the evaluator) Randy [name] is a 42 years old transgendered [name] admitted with Major Depression Disorder.  She will benefit from crisis stabilization, evaluation for medication, psycho-education groups for coping skills development, group therapy and case management for discharge planning.                        Signature:  Wynn Banker, 10/25/2012 1:16 PM

## 2012-10-26 DIAGNOSIS — F1910 Other psychoactive substance abuse, uncomplicated: Secondary | ICD-10-CM

## 2012-10-26 DIAGNOSIS — F1994 Other psychoactive substance use, unspecified with psychoactive substance-induced mood disorder: Secondary | ICD-10-CM

## 2012-10-26 DIAGNOSIS — F1120 Opioid dependence, uncomplicated: Secondary | ICD-10-CM

## 2012-10-26 DIAGNOSIS — F332 Major depressive disorder, recurrent severe without psychotic features: Secondary | ICD-10-CM

## 2012-10-26 MED ORDER — TRAZODONE HCL 50 MG PO TABS
50.0000 mg | ORAL_TABLET | Freq: Once | ORAL | Status: AC
  Administered 2012-10-26: 50 mg via ORAL
  Filled 2012-10-26: qty 1

## 2012-10-26 MED ORDER — TRAZODONE HCL 50 MG PO TABS
50.0000 mg | ORAL_TABLET | Freq: Every evening | ORAL | Status: DC | PRN
  Administered 2012-10-26 – 2012-10-28 (×2): 50 mg via ORAL
  Filled 2012-10-26: qty 28

## 2012-10-26 NOTE — [facility] Group Notes (Signed)
[facility] LCSW Group Therapy  10/26/2012 3:09 PM  Type of Therapy:  Group Therapy  Participation Level:  Minimal  Participation Quality:  Attentive  Affect:  Depressed and Flat  Cognitive:  Alert and Oriented  Insight:  Developing/Improving  Engagement in Therapy:  Improving  Modes of Intervention:  Discussion, Exploration, Problem-solving, Rapport Building, Socialization and Support  Summary of Progress/Problems: The topic for group today was emotional regulation. This group focused on both positive and negative emotion identification and allowed group members to process ways to identify feelings, regulate negative emotions, and find healthy ways to manage internal/external emotions. Group members were asked to reflect on a time when their reaction to an emotion led to a negative outcome and explored how alternative responses using emotion regulation would have benefited them. Group members were also asked to discuss a time when emotion regulation was utilized when a negative emotion was experienced.   Randy [name] was observed to be withdrawn initially as her peers discussed their perspectives on emotional regulation and the importance it has in regard to social interactions. Randy [name] later stated that she identifies emotions to have a direct connection with her thinking and that her depression often comes from negative thinking. She discussed her past experiences with making poor choices and it often leads to nonproductive outcomes. Randy [name] then reported that she desires to manage her depression and ultimately overcome and regulate her emotions overall.   [name], Randy [name] 10/26/2012, 3:09 PM

## 2012-10-26 NOTE — Progress Notes (Signed)
D: Patient appropriate and cooperative with staff. Patient's affect/mood is depressed. She reported on the self inventory sheet that her sleep is poor, appetite is good, energy level is low and ability to pay attention is improving. Patient rated depression and feelings of hopelessness "6". She's attending groups and compliant with medication regimen. Patient has little to no interaction with peers on the unit.  A: Support and encouragement provided to patient. Administered scheduled medications per ordering MD. Monitor Q15 minute checks for safety.  R: Patient receptive. Denies SI/HI/AVH. Patient remains safe on the unit.

## 2012-10-26 NOTE — [facility] Group Notes (Signed)
[facility] LCSW Aftercare Discharge Planning Group Note  10/26/2012  9:36 AM    Participation Quality:  Appropriate   Mood/Affect:  Appropriate Depressed  Depression Rating: 6  Anxiety Rating:  5  Thoughts of Suicide:  Yes  Will you contract for safety?   Yes  Current AVH:  NA  Plan for Discharge/Comments:  Patient reports being a little better today.  Patient asked that writer follow up with [facility] as she is uncomfortable with being housed on the men's unit at [facility] for 30 days.     Transportation Means:   Patient uses public transportation.  Supports: Patient has limited support system.  Karie Skowron, Joesph July

## 2012-10-26 NOTE — Progress Notes (Signed)
Recreation Therapy Notes  Date: 06.18.2014 Time: 3:00pm Location: [address] Dayroom      Group Topic/Focus: Communication, Journalist, newspaper, Team Work  Participation Level: Active  Participation Quality: Appropriate  Affect: Euhtymic  Cognitive: Appropriate   Additional Comments: Activity: Landing Pad ; Explanation: Patients in groups of 4-5 were given 10 drinking straws and a length of medical tape. Using the straws and the medical tape patients were asked create a landing pad that would catch a golf ball dropped from approximately 6 feet.    Patient actively participated in group activity. Patient assisted peers with building team landing pad. Patient team successful at building a landing pad that captured golf ball. Patient listened to wrap up discussion about the importance of using good communication skills, problem solving skills, and team work to Engineer, petroleum.   Marykay Lex Kynli Chou, LRT/CTRS  Jearl Klinefelter 10/26/2012 5:17 PM

## 2012-10-26 NOTE — Progress Notes (Signed)
Adult Psychoeducational Group Note  Date:  10/26/2012 Time:  10:01 PM  Group Topic/Focus:  Wrap-Up Group:   The focus of this group is to help patients review their daily goal of treatment and discuss progress on daily workbooks.  Participation Level:  Minimal  Participation Quality:  Appropriate  Affect:  Appropriate  Cognitive:  Alert and Oriented  Insight: Appropriate  Engagement in Group:  Developing/Improving  Modes of Intervention:  Exploration, Problem-solving and Support  Additional Comments:  Pt stated that one positive is that he know about his future treatment plans and where she is going.  [name], Randal Buba 10/26/2012, 10:01 PM

## 2012-10-26 NOTE — Progress Notes (Signed)
Patient resting quietly with eye closed. Respirations even and unlabored. No distress noted. Q 15 minute check continues as ordered to maintain safety.

## 2012-10-26 NOTE — Progress Notes (Signed)
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 She complains of cravings to use cocaine and opiates. Reports passive SI but denies having a plan but states "Nothing I could do while I am in here."  Diagnosis:   Axis I: Major Depression, Recurrent severe, Substance Abuse, Substance Induced Mood Disorder and Opioid dependence Axis II: Cluster B Traits Axis III:  Past Medical History  Diagnosis Date  . Depression   . Hepatitis C   . 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 Cellulitis  Of  Left Forearm 01/10/2012    From IVDA   Axis IV: economic problems, housing problems, occupational problems, other psychosocial or environmental problems, problems related to social environment, problems with access to health care services and problems with primary support group Axis V: 51-60 moderate symptoms  ADL's:  Intact  Sleep: Poor  Appetite:  Good  Suicidal Ideation:  Passive SI no plan Homicidal Ideation:  Denies AEB (as evidenced by):  Psychiatric Specialty Exam: Review of Systems  Constitutional: Negative.   HENT: Negative.   Eyes: Negative.   Respiratory: Negative.   Cardiovascular: Negative.   Gastrointestinal: Negative.   Genitourinary: Negative.   Musculoskeletal: Negative.   Skin: Negative.   Neurological: Negative.   Endo/Heme/Allergies: Negative.   Psychiatric/Behavioral: Positive for depression and substance abuse. Negative for suicidal ideas, hallucinations and memory loss. The patient has insomnia. The patient is not nervous/anxious.     Blood pressure 124/76, pulse 76, temperature 98.4 F (36.9 C), temperature source Oral, resp. rate 18, height 5\' 6"  (1.676 m), weight 73.936 kg (163  lb).Body mass index is 26.32 kg/(m^2).  General Appearance: Casual and Disheveled  Eye Contact::  Good  Speech:  Clear and Coherent  Volume:  Normal  Mood:  Anxious  Affect:  Full Range  Thought Process:  Goal Directed and Intact  Orientation:  Full (Time, Place, and Person)  Thought Content:  WDL  Suicidal Thoughts:  Yes.  without intent/plan  Homicidal Thoughts:  No  Memory:  Immediate;   Good Recent;   Good Remote;   Good  Judgement:  Impaired  Insight:  Fair  Psychomotor Activity:  Normal  Concentration:  Fair  Recall:  Good  Akathisia:  No  Handed:  Right  AIMS (if indicated):     Assets:  Communication Skills Desire for Improvement Physical Health Resilience Social Support  Sleep:  Number of Hours: 5.75   Current Medications: Current Facility-Administered Medications  Medication Dose Route Frequency Provider Last Rate Last Dose  . acetaminophen (TYLENOL) tablet 650 mg  650 mg Oral Q6H PRN Earney Navy, NP      . alum & mag hydroxide-simeth (MAALOX/MYLANTA) 200-200-20 MG/5ML suspension 30 mL  30 mL Oral Q4H PRN Earney Navy, NP      . ARIPiprazole (ABILIFY) tablet 5 mg  5 mg Oral Daily Earney Navy, NP   5 mg at 10/26/12 0827  . buPROPion (WELLBUTRIN XL) 24 hr tablet 150 mg  150 mg Oral Daily Earney Navy, NP   150 mg at 10/26/12 0827  . escitalopram (LEXAPRO) tablet 20 mg  20 mg Oral Daily Earney Navy, NP   20 mg at 10/26/12 0827  .  estradiol (ESTRACE) tablet 2 mg  2 mg Oral TID Nehemiah Settle, MD      . finasteride (PROSCAR) tablet 5 mg  5 mg Oral Daily Earney Navy, NP   5 mg at 10/26/12 0826  . magnesium hydroxide (MILK OF MAGNESIA) suspension 30 mL  30 mL Oral Daily PRN Earney Navy, NP      . nicotine (NICODERM CQ - dosed in mg/24 hours) patch 21 mg  21 mg Transdermal Daily Nehemiah Settle, MD   21 mg at 10/26/12 0828  . spironolactone (ALDACTONE) tablet 50 mg  50 mg Oral Daily Earney Navy, NP    50 mg at 10/26/12 0828  . traZODone (DESYREL) tablet 50 mg  50 mg Oral QHS PRN,MR X 1 Fransisca Kaufmann, NP        Lab Results: No results found for this or any previous visit (from the past 48 hour(s)).  Physical Findings: AIMS: Facial and Oral Movements Muscles of Facial Expression: None, normal Lips and Perioral Area: None, normal Jaw: None, normal Tongue: None, normal,Extremity Movements Upper (arms, wrists, hands, fingers): None, normal Lower (legs, knees, ankles, toes): None, normal, Trunk Movements Neck, shoulders, hips: None, normal, Overall Severity Severity of abnormal movements (highest score from questions above): None, normal Incapacitation due to abnormal movements: None, normal Patient's awareness of abnormal movements (rate only patient's report): No Awareness, Dental Status Current problems with teeth and/or dentures?: No Does patient usually wear dentures?: No  CIWA:  CIWA-Ar Total: 0 COWS:  COWS Total Score: 0  Treatment Plan Summary: Daily contact with patient to assess and evaluate symptoms and progress in treatment Medication management  Plan: Continue crisis management and stabilization.  Medication management: Reviewed medications with patient who stated no untoward effects. Ordered Trazodone 50 mg at hs prn with repeat dose to help improve the quality of patient's sleep.  Encouraged patient to attend groups and participate in group counseling sessions and activities.  Address health issues: Vitals reviewed and stable.  Discharge plan in progress.  Continue current treatment plan. Patient will be continuing [facility] at [facility].   Medical Decision Making Problem Points:  Established problem, stable/improving (1) and Review of psycho-social stressors (1) Data Points:  Review of medication regiment & side effects (2)  I certify that inpatient services furnished can reasonably be expected to improve the patient's condition.   Swayzee Wadley NP-C 10/26/2012, 1:33  PM

## 2012-10-26 NOTE — Progress Notes (Signed)
Adult Psychoeducational Group Note  Date:  10/26/2012 Time:  12:22 PM  Group Topic/Focus:  Personal Choices and Values:   The focus of this group is to help patients assess and explore the importance of values in their lives, how their values affect their decisions, how they express their values and what opposes their expression.  Participation Level:  Minimal  Participation Quality:  Appropriate and Attentive  Affect:  Blunted and Flat  Cognitive:  Alert and Appropriate  Insight: Good  Engagement in Group:  Lacking and Limited  Modes of Intervention:  Activity, Discussion, Socialization and Support  Additional Comments:  Patient participated minimally in group. The purpose of this group was for the patient to identify what four values they found most important to them and how to incorporate them into their lives. The patient identified a change or a choice for how they can support that particular value in their life. The patient shared a value with the group and an example of a choice to take responsibility for that value. There was an emphasis on choices and how choices impact and support values.   Cathlean Cower 10/26/2012, 12:22 PM

## 2012-10-27 NOTE — Progress Notes (Signed)
D: Patient pleasant and cooperative with staff. Patient's affect is appropriate to circumstance, but mood is anxious. She verbalized earlier today that she would like to be discharged on tomorrow. Patient is attending and participating in groups. Declined self inventory sheet today.  A: Support and encouragement provided to patient. Scheduled medications administered per MD orders. Maintain Q15 minute checks for safety.  R: Patient receptive. Denies SI/HI/AVH. Patient remains safe.

## 2012-10-27 NOTE — Progress Notes (Signed)
Adult Psychoeducational Group Note  Date:  10/27/2012 Time:  1:54 PM  Group Topic/Focus:  Overcoming Stress:   The focus of this group is to define stress and help patients assess their triggers.  Participation Level:  Active  Participation Quality:  Attentive  Affect:  Appropriate  Cognitive:  Appropriate  Insight: Good  Engagement in Group:  Improving and Limited  Modes of Intervention:  Discussion, Education and Support  Additional Comments:  Pt discussed using yoga and walking as a way of relieving stress.  Reynolds Bowl 10/27/2012, 1:54 PM

## 2012-10-27 NOTE — Progress Notes (Signed)
[facility] MD Progress Note  10/27/2012 1:47 PM Randy [name]  MRN:  [id number]  Subjective:  Patient has been compliant with inpatient program and mostly visible on the unit and actively participating on unit activities. Patient reports leaving earlier than Monday to pack personal belongings including computer from boarding house where she lived few weeks prior to hospitalization. Patient has no complaints today. She has been talking with case manager regarding disposiiton plans and excited about residential substance abuse treatment program.   Reportedly has stresses about being homeless, drug relapse and sleep disturbance. Patient reports feeling more hopeful about the future as she has never been in treatment for his substance abuse before. She complains of cravings to use cocaine and opiates. Reports passive SI but denies having a plan but states "Nothing I could do while I am in here." she contracts for safety in hospital.  Diagnosis:   Axis I: Major Depression, Recurrent severe, Substance Abuse, Substance Induced Mood Disorder and Opioid dependence Axis II: Cluster B Traits Axis III:  Past Medical History  Diagnosis Date  . Depression   . Hepatitis C   . 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 Cellulitis  Of  Left Forearm 01/10/2012    From IVDA   Axis IV: economic problems, housing problems, occupational problems, other psychosocial or environmental problems, problems related to social environment, problems with access to health care services and problems with primary support group Axis V: 51-60 moderate symptoms  ADL's:  Intact  Sleep: Poor  Appetite:  Good  Suicidal Ideation:  Passive SI no plan Homicidal Ideation:  Denies AEB (as evidenced by):  Psychiatric Specialty Exam: Review of Systems  Constitutional: Negative.   HENT: Negative.   Eyes: Negative.   Respiratory: Negative.   Cardiovascular: Negative.   Gastrointestinal: Negative.   Genitourinary: Negative.    Musculoskeletal: Negative.   Skin: Negative.   Neurological: Negative.   Endo/Heme/Allergies: Negative.   Psychiatric/Behavioral: Positive for depression and substance abuse. Negative for suicidal ideas, hallucinations and memory loss. The patient has insomnia. The patient is not nervous/anxious.     Blood pressure 112/73, pulse 87, temperature 98 F (36.7 C), temperature source Oral, resp. rate 20, height 5\' 6"  (1.676 m), weight 73.936 kg (163 lb).Body mass index is 26.32 kg/(m^2).  General Appearance: Casual and Disheveled  Eye Contact::  Good  Speech:  Clear and Coherent  Volume:  Normal  Mood:  Anxious  Affect:  Full Range  Thought Process:  Goal Directed and Intact  Orientation:  Full (Time, Place, and Person)  Thought Content:  WDL  Suicidal Thoughts:  Yes.  without intent/plan  Homicidal Thoughts:  No  Memory:  Immediate;   Good Recent;   Good Remote;   Good  Judgement:  Impaired  Insight:  Fair  Psychomotor Activity:  Normal  Concentration:  Fair  Recall:  Good  Akathisia:  No  Handed:  Right  AIMS (if indicated):     Assets:  Communication Skills Desire for Improvement Physical Health Resilience Social Support  Sleep:  Number of Hours: 5.25   Current Medications: Current Facility-Administered Medications  Medication Dose Route Frequency Provider Last Rate Last Dose  . acetaminophen (TYLENOL) tablet 650 mg  650 mg Oral Q6H PRN Earney Navy, NP      . alum & mag hydroxide-simeth (MAALOX/MYLANTA) 200-200-20 MG/5ML suspension 30 mL  30 mL Oral Q4H PRN Earney Navy, NP      . ARIPiprazole (ABILIFY) tablet 5 mg  5 mg Oral  Daily Earney Navy, NP   5 mg at 10/27/12 0738  . buPROPion (WELLBUTRIN XL) 24 hr tablet 150 mg  150 mg Oral Daily Earney Navy, NP   150 mg at 10/27/12 0738  . escitalopram (LEXAPRO) tablet 20 mg  20 mg Oral Daily Earney Navy, NP   20 mg at 10/27/12 0738  . estradiol (ESTRACE) tablet 2 mg  2 mg Oral TID Nehemiah Settle, MD   2 mg at 10/27/12 1156  . finasteride (PROSCAR) tablet 5 mg  5 mg Oral Daily Earney Navy, NP   5 mg at 10/27/12 0741  . magnesium hydroxide (MILK OF MAGNESIA) suspension 30 mL  30 mL Oral Daily PRN Earney Navy, NP      . nicotine (NICODERM CQ - dosed in mg/24 hours) patch 21 mg  21 mg Transdermal Daily Nehemiah Settle, MD   21 mg at 10/27/12 0735  . spironolactone (ALDACTONE) tablet 50 mg  50 mg Oral Daily Earney Navy, NP   50 mg at 10/27/12 0737  . traZODone (DESYREL) tablet 50 mg  50 mg Oral QHS PRN,MR X 1 Fransisca Kaufmann, NP   50 mg at 10/26/12 2316    Lab Results: No results found for this or any previous visit (from the past 48 hour(s)).  Physical Findings: AIMS: Facial and Oral Movements Muscles of Facial Expression: None, normal Lips and Perioral Area: None, normal Jaw: None, normal Tongue: None, normal,Extremity Movements Upper (arms, wrists, hands, fingers): None, normal Lower (legs, knees, ankles, toes): None, normal, Trunk Movements Neck, shoulders, hips: None, normal, Overall Severity Severity of abnormal movements (highest score from questions above): None, normal Incapacitation due to abnormal movements: None, normal Patient's awareness of abnormal movements (rate only patient's report): No Awareness, Dental Status Current problems with teeth and/or dentures?: No Does patient usually wear dentures?: No  CIWA:  CIWA-Ar Total: 0 COWS:  COWS Total Score: 0  Treatment Plan Summary: Daily contact with patient to assess and evaluate symptoms and progress in treatment Medication management  Plan:  Continue crisis management and stabilization.  Medication management: Reviewed medications with patient who stated no untoward effects. Continue Abilify, wellbutrin, lexapro and Trazodone 50 mg at hs prn with repeat dose to help improve patient's sleep. Continue other prescribed medication for hepatitis C. Encouraged patient to attend  groups and participate in group counseling sessions. Address health issues: Vitals reviewed and stable.  Discharge plan in progress.  Continue current treatment plan.  Patient will be continuing treatment at [facility].   Medical Decision Making Problem Points:  Established problem, stable/improving (1) and Review of psycho-social stressors (1) Data Points:  Review of medication regiment & side effects (2)  I certify that inpatient services furnished can reasonably be expected to improve the patient's condition.   Syd Newsome,JANARDHAHA R. NP-C 10/27/2012, 1:47 PM

## 2012-10-27 NOTE — Progress Notes (Signed)
Patient ID: Randy [name], male   DOB: [birth year], 42 y.o.   MRN: [id number] D: Pt. In dayroom interacting with another client, and watching TV. Pt. Reports depression at "2-3" "back to baseline" Pt. Says he hopes to be discharged tomorrow and has plans to stay with an older friend who has been clean for years and active in [place]. Pt. Reports friend is 20 years older. Plans to leave for [facility] on Monday. A: Writer spoke with client and commended him on his committment to sobriety. Data processing manager. Staff will monitor q80min for safety. R: Pt. Is safe on the unit. Pt. Attended karaoke and sang two songs.

## 2012-10-27 NOTE — Progress Notes (Signed)
Pt attended karaoke group this evening. Pt participated with peers. 

## 2012-10-27 NOTE — [facility] Group Notes (Signed)
[facility] LCSW Group Therapy  [facility] 1:15 - 2:30 PM  10/27/2012  4:57 PM   Type of Therapy:  Group Therapy  Participation Level:  Minimal  Participation Quality:  Attentive  Affect:  Depressed, Flat and Irritable  Cognitive:  Appropriate  Insight:  Developing/Improving and Engaged  Engagement in Therapy:  Developing/Improving   Modes of Intervention:  Discussion, Education, Exploration, Problem-Solving, Rapport Building, Support   Summary of Progress/Problems:  Patient listened attentively to speaker from [employer] [facility].  Patient made no comment on the presentation.    Wynn Banker 10/27/2012 4:58 PM

## 2012-10-27 NOTE — [facility] Group Notes (Signed)
[facility] LCSW Aftercare Discharge Planning Group Note  10/27/2012  12:03 PM    Participation Quality:  Appropriate   Mood/Affect:  Appropriate  Depression Rating: 4  Anxiety Rating:  5  Thoughts of Suicide:  Yes  Will you contract for safety?   Yes  Current AVH:  NA  Plan for Discharge/Comments:  Patient reports being a much better today and excited about the decision she has made to received residential treatment.  Patient shared she will follow up with [facility] 30 day program.  Patient shared this is the biggest and best decision she has made in years.  Transportation Means:   Patient uses public transportation.  Supports: Patient has limited support system.  Ying Blankenhorn, Joesph July

## 2012-10-27 NOTE — Progress Notes (Signed)
Patient ID: Randy [name], male   DOB: 08/25/1970, 42 y.o.   MRN: [id number] D: Pt. Reports "a little anxious" about going to [facility], "I think it's going to be a good thing" Pt. Reports depression level at "5" of 10.  A: Writer introduced self to client and encouraged group. Staff will monitor q76min for safety. R: Pt. Is safe on the unit. Pt. Attended group.

## 2012-10-28 DIAGNOSIS — F3130 Bipolar disorder, current episode depressed, mild or moderate severity, unspecified: Principal | ICD-10-CM

## 2012-10-28 DIAGNOSIS — F1920 Other psychoactive substance dependence, uncomplicated: Secondary | ICD-10-CM

## 2012-10-28 MED ORDER — FINASTERIDE 5 MG PO TABS: 5.0000 mg | ORAL_TABLET | Freq: Every day | ORAL | Status: DC

## 2012-10-28 MED ORDER — TRAZODONE HCL 50 MG PO TABS: 50.0000 mg | ORAL_TABLET | Freq: Every evening | ORAL | Status: DC | PRN

## 2012-10-28 MED ORDER — SPIRONOLACTONE 50 MG PO TABS: 50.0000 mg | ORAL_TABLET | Freq: Every day | ORAL | Status: DC

## 2012-10-28 MED ORDER — ESCITALOPRAM OXALATE 20 MG PO TABS: 20.0000 mg | ORAL_TABLET | Freq: Every day | ORAL | Status: DC

## 2012-10-28 MED ORDER — ESTRADIOL 2 MG PO TABS: 2.0000 mg | ORAL_TABLET | Freq: Three times a day (TID) | ORAL | Status: DC

## 2012-10-28 MED ORDER — ARIPIPRAZOLE 5 MG PO TABS: 5.0000 mg | ORAL_TABLET | Freq: Every day | ORAL | Status: DC

## 2012-10-28 MED ORDER — BUPROPION HCL ER (XL) 150 MG PO TB24: 150.0000 mg | ORAL_TABLET | Freq: Every day | ORAL | Status: DC

## 2012-10-28 NOTE — Plan of Care (Signed)
Problem: Alteration in mood Goal: LTG-Pt's behavior demonstrates decreased signs of depression (Patient's behavior demonstrates decreased signs of depression to the point the patient is safe to return home and continue treatment in an outpatient setting)  Outcome: Completed/Met Date Met:  10/28/12 Patient has attended groups and easily engaged in discussions.  Outpatient follow up is scheduled. [employer], LCSW 10/28/2012

## 2012-10-28 NOTE — [facility] Group Notes (Signed)
[facility] LCSW Aftercare Discharge Planning Group Note  10/28/2012  12:52 PM    Participation Quality:  Appropriate   Mood/Affect:  Appropriate  Depression Rating: 2  Anxiety Rating:  4  Thoughts of Suicide:  Yes  Will you contract for safety?   Yes  Current AVH:  NA  Plan for Discharge/Comments:  Patient reports being stable on medications and ready to discharge home today.  She requested a bus pass for transportation.,  Transportation Means:   Patient uses public transportation.  Supports: Patient has limited support system.  Alydia Gosser, Joesph July

## 2012-10-28 NOTE — [facility] Suicide Risk Assessment (Signed)
Suicide Risk Assessment  Discharge Assessment     Demographic Factors:  Adolescent or young adult, Gay, lesbian, or bisexual orientation, Low socioeconomic status, Living alone and Unemployed  Mental Status Per Nursing Assessment::   On Admission:  NA  Current Mental Status by Physician: Mental Status Examination: Patient appeared as per his stated age, casually dressed, and fairly groomed, and maintaining good eye contact. Patient has good mood and his affect was constricted. He has normal rate, rhythm, and volume of speech. His thought process is linear and goal directed. Patient has denied suicidal, homicidal ideations, intentions or plans. Patient has no evidence of auditory or visual hallucinations, delusions, and paranoia. Patient has fair insight judgment and impulse control.  Loss Factors: Financial problems/change in socioeconomic status  Historical Factors: Family history of mental illness or substance abuse and Impulsivity  Risk Reduction Factors:   Living with another person, especially a relative  Continued Clinical Symptoms:  Bipolar Disorder:   Depressive phase Depression:   Comorbid alcohol abuse/dependence Recent sense of peace/wellbeing Alcohol/Substance Abuse/Dependencies  Cognitive Features That Contribute To Risk:  Polarized thinking    Suicide Risk:  Minimal: No identifiable suicidal ideation.  Patients presenting with no risk factors but with morbid ruminations; may be classified as minimal risk based on the severity of the depressive symptoms  Discharge Diagnoses:   AXIS I:  Bipolar, Depressed, Substance Induced Mood Disorder and Polysubstance dependence AXIS II:  Deferred AXIS III:   Past Medical History  Diagnosis Date  . Depression   . Hepatitis C   . 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 Cellulitis  Of  Left Forearm 01/10/2012    From IVDA   AXIS IV:  economic problems, housing problems, occupational problems, other psychosocial  or environmental problems and problems related to social environment AXIS V:  51-60 moderate symptoms  Plan Of Care/Follow-up recommendations:  Activity:  As tolerated Diet:  Regular  Is patient on multiple antipsychotic therapies at discharge:  No   Has Patient had three or more failed trials of antipsychotic monotherapy by history:  No  Recommended Plan for Multiple Antipsychotic Therapies: Not applicable  Athanasios Heldman,JANARDHAHA R. 10/28/2012, 10:50 AM

## 2012-10-28 NOTE — Progress Notes (Signed)
Discharge Note: Discharge instructions/prescriptions/medication samples and bus passes given to patient. Patient verbalized understanding of discharge instructions and prescriptions. Returned belongings to patient. Denies SI/HI/AVH. Patient d/c without incident to the lobby.

## 2012-10-28 NOTE — Plan of Care (Signed)
Problem: Ineffective individual coping Goal: STG: Patient will participate in after care plan Outcome: Completed/Met Date Met:  10/28/12 Patient has attended groups and easily engaged in discussions.  Outpatient follow up is scheduled. [employer], LCSW 10/28/2012

## 2012-10-28 NOTE — Progress Notes (Signed)
[facility] Adult Case Management Discharge Plan :  Will you be returning to the same living situation after discharge: Yes,  Patient returning to friend's home At discharge, do you have transportation home?:Yes,  Patient to arrange transportation. Do you have the ability to pay for your medications:Yes,  Patient can obtain medications.  Release of information consent forms completed and in the chart;  Patient's signature needed at discharge.  Patient to Follow up at: Follow-up Information   Follow up with [facility] On 10/31/2012. ( Monday, October 31, 2012 at [place])    Contact information:   [address] [place], [place]   [address]  [phone]      Patient denies SI/HI:   Patient no longer endorsing SI/HI or other thoughts of self harm.  Safety Planning and Suicide Prevention discussed:   .Reviewed with all patients during discharge planning group  Kellin Bartling, Joesph July 10/28/2012, 10:16 AM

## 2012-10-28 NOTE — Progress Notes (Signed)
Patient ID: LOU LOEWE, male   DOB: 08-26-1970, 42 y.o.   MRN: [id number] PER STATE REGULATIONS 482.30  THIS CHART WAS REVIEWED FOR MEDICAL NECESSITY WITH RESPECT TO THE PATIENT'S ADMISSION/ DURATION OF STAY.  NEXT REVIEW DATE: 10/31/2012  Willa Rough, RN, BSN CASE MANAGER

## 2012-10-28 NOTE — Tx Team (Signed)
Interdisciplinary Treatment Plan Update   Date Reviewed:  10/28/2012  Time Reviewed:  9:50 AM  Progress in Treatment:   Attending groups: Yes Participating in groups: Yes Taking medication as prescribed: Yes  Tolerating medication: Yes Family/Significant other contact made: No.  Patient advised of no family involvement Patient understands diagnosis: Yes  Discussing patient identified problems/goals with staff: Yes Medical problems stabilized or resolved: Yes Denies suicidal/homicidal ideation: Yes Patient has not harmed self or others: Yes  For review of initial/current patient goals, please see plan of care.  Estimated Length of Stay:  Discharge home today.  Reasons for Continued Hospitalization:   New Problems/Goals identified:    Discharge Plan or Barriers:   Home with outpatient follow up with [facility] on 10/31/12  Additional Comments:  Patient reports being stable on medications and ready to discharge today. Attendees:  Patient: Randy [name] 10/28/2012 9:50 AM   Signature: Mervyn Gay, MD 10/28/2012 9:50 AM  Signature: 10/28/2012 9:50 AM  Signature: Harold Barban, RN 10/28/2012 9:50 AM  Signature:Beverly Terrilee Croak, RN 10/28/2012 9:50 AM  Signature:   10/28/2012 9:50 AM  Signature:  Juline Patch, LCSW 10/28/2012 9:50 AM  Signature:  Reyes Ivan, LCSW 10/28/2012 9:50 AM  Signature:  Sharin Grave Coordinator 10/28/2012 9:50 AM  Signature: Fransisca Kaufmann, [facility] 10/28/2012 9:50 AM  Signature:    Signature:    Signature:      Scribe for Treatment Team:   Juline Patch,  10/28/2012 9:50 AM

## 2012-10-28 NOTE — Progress Notes (Addendum)
Adult Psychoeducational Group Note  Date:  10/28/2012 Time:  2:11 PM  Group Topic/Focus:  Early Warning Signs:   The focus of this group is to help patients identify signs or symptoms they exhibit before slipping into an unhealthy state or crisis.  Participation Level:  Minimal  Participation Quality:  Appropriate and Attentive  Affect:  Appropriate  Cognitive:  Appropriate  Insight: Good  Engagement in Group:  Developing/Improving  Modes of Intervention:  Discussion, Education and Support  Additional Comments:  [name] attended group and shared during group. Patient participated and shared a time in life where relapse occurred and shard the changes that happened during that time of crisis. Patient shared behavior, attitude, feelings, and thought changes during the time of crisis. Identifying the warning signs and learning how to cope and identify triggers during a time of crisis was a way for patient to understand what could be done differently if in time of need of crisis.    [name] 10/28/2012, 2:11 PM

## 2012-10-28 NOTE — Discharge Summary (Signed)
Physician Discharge Summary Note  Patient:  Randy [name] is an 42 y.o., male MRN:  [id number] DOB:  07-15-1970 Patient phone:  [phone] (home)  Patient address:   [address] [place] [address],   Date of Admission:  10/24/2012 Date of Discharge: 10/28/12  Reason for Admission:  Depression, Polysubstance Abuse  Discharge Diagnoses: Active Problems:   * No active hospital problems. *  Review of Systems  Constitutional: Negative.   HENT: Negative.   Eyes: Negative.   Respiratory: Negative.   Cardiovascular: Negative.   Gastrointestinal: Negative.   Genitourinary: Negative.   Musculoskeletal: Negative.   Skin: Negative.   Neurological: Negative.   Endo/Heme/Allergies: Negative.   Psychiatric/Behavioral: Positive for substance abuse. Negative for depression, suicidal ideas, hallucinations and memory loss. The patient is nervous/anxious. The patient does not have insomnia.    Axis Diagnosis:   AXIS I:  Bipolar, Depressed, Substance Induced Mood Disorder and Polysubstance dependence AXIS II:  Deferred AXIS III:   Past Medical History  Diagnosis Date  . Depression   . Hepatitis C   . 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 Cellulitis  Of  Left Forearm 01/10/2012    From IVDA   AXIS IV:  economic problems, housing problems, occupational problems, other psychosocial or environmental problems and problems related to social environment AXIS V:  61-70 mild symptoms  Level of Care:  OP  Hospital Course:  Randy [name] is a 42 y.o. single white male-to-male transgender pt. She reports that she was transported to [facility] by a friend, who dropped her off at [facility] and departed. She complains of depression, and has relapsed on heroin and cocaine. Pt reports that she was recently admitted to [facility]; EPIC records verify that she was admitted on 09/12/2012. Upon discharge she moved in with several friends. On 10/19/2012, however, they were evicted for having too many  residents in the household. Pt has since moved into a boarding house. She tearfully remarks, "Boarding houses are depressing." She reports that on that day she relapsed on heroin. However, she also reports that she did not follow up with [facility], where she was supposed to present as a walk-in patient, upon discharge from [facility]. She also relapsed on crack cocaine the day she was discharged. In addition to these stressors pt reports that she continues to grieve over the death of her father in 2006/10/10, exacerbated by the fact that today is Father's Day; "I've been missing him a lot today." Pt endorses SI with plan to overdose "if I could afford it." She has had SI in the past but has never made a suicide attempt or engaged in self mutilation. She denies any history of HI or physical aggression toward others. She denies hallucinations, and she exhibits no signs of delusional thought or of internal stimuli. Her reality testing appears to be intact. Pt endorses depressed mood with symptoms noted in the "risk to self" assessment below. She also reports problems with anxiety, but denies having panic attacks.      The duration of stay was four days.The patient was seen and evaluated by the Treatment team consisting of Psychiatrist, NP-C, RN, Case Manager, and Therapist for evaluation and treatment plan with goal of stabilization upon discharge. The patient's physical and mental health problems were identified and treated appropriately.      Multiple modalities of treatment were used including medication, individual and group therapies, unit programming, improved nutrition, physical activity, and family sessions as needed. Patient's home medications were continued  including ones that she uses for her trans-gendered process. Her medications of Abilify 5 mg daily, Wellbutrin 150 mg daily, Lexapro 20 mg daily, Proscar 5 mg daily, Aldactone 50 mg daily were continued. Patient was ordered Trazodone 50 mg at hs with repeat dose to  help improve the quality of patient's sleep.      The symptoms of depression were monitored daily by evaluation by clinical provider.  The patient's mental and emotional status was evaluated by a daily self inventory completed by the patient. Improvement was demonstrated by declining numbers on the self assessment, improving vital signs, increased cognition, and improvement in mood, sleep, appetite as well as a reduction in physical symptoms. Patient appeared motivated to obtained sobriety and talked about plans for her future such as going back to school. She attended the scheduled groups and interacted well with peers on the unit. The patient reported to staff that his depression had returned to his baseline of two.   The patient was evaluated and found to be stable enough for discharge and was released to home per the initial plan of treatment. Patient verbalized readiness to discharge and reported zero depression level since being back on her medications. Patient plans to enter treatment on [facility] starting next Monday morning. She reports this would be her first time in treatment. The patient met with treatment team prior to her discharge and denied suicidal thoughts. Randy [name] was asked how she planned to handle the weekend in avoiding a relapse and stated "I'm going to a friends house who is a recovered drinker who has been through treatment. I will not have access to drugs nor any money. I think I will be ok." Patient advised that [facility] will perform a drug screen and that using any substances would jeopardize his treatment. The patient verbalized understanding of this.   Mental Status Exam:  For mental status exam please see mental status exam and  suicide risk assessment completed by attending physician prior to discharge.  Consults:  None  Significant Diagnostic Studies:  labs: Chem profile, CBC, UDS  Discharge Vitals:   Blood pressure 131/84, pulse 94, temperature 98.3 F (36.8 C), temperature  source Oral, resp. rate 18, height 5\' 6"  (1.676 m), weight 73.936 kg (163 lb). Body mass index is 26.32 kg/(m^2). Lab Results:   No results found for this or any previous visit (from the past 72 hour(s)).  Physical Findings: AIMS: Facial and Oral Movements Muscles of Facial Expression: None, normal Lips and Perioral Area: None, normal Jaw: None, normal Tongue: None, normal,Extremity Movements Upper (arms, wrists, hands, fingers): None, normal Lower (legs, knees, ankles, toes): None, normal, Trunk Movements Neck, shoulders, hips: None, normal, Overall Severity Severity of abnormal movements (highest score from questions above): None, normal Incapacitation due to abnormal movements: None, normal Patient's awareness of abnormal movements (rate only patient's report): No Awareness, Dental Status Current problems with teeth and/or dentures?: No Does patient usually wear dentures?: No  CIWA:  CIWA-Ar Total: 0 COWS:  COWS Total Score: 0  Psychiatric Specialty Exam: See Psychiatric Specialty Exam and Suicide Risk Assessment completed by Attending Physician prior to discharge.  Discharge destination:  [facility]  Is patient on multiple antipsychotic therapies at discharge:  No   Has Patient had three or more failed trials of antipsychotic monotherapy by history:  No  Recommended Plan for Multiple Antipsychotic Therapies: N/A     Medication List    ASK your doctor about these medications     Indication   ARIPiprazole  5 MG tablet  Commonly known as:  ABILIFY  Take 1 tablet (5 mg total) by mouth at bedtime.   Indication:  Major Depressive Disorder     buPROPion 150 MG 24 hr tablet  Commonly known as:  WELLBUTRIN XL  Take 1 tablet (150 mg total) by mouth daily.   Indication:  Major Depressive Disorder     escitalopram 20 MG tablet  Commonly known as:  LEXAPRO  Take 1 tablet (20 mg total) by mouth daily.   Indication:  Depression     estradiol 2 MG tablet  Commonly  known as:  ESTRACE  Take 1 tablet (2 mg total) by mouth 3 (three) times daily.   Indication:  part of hormonal treatment for transgender process     finasteride 5 MG tablet  Commonly known as:  PROSCAR  Take 1 tablet (5 mg total) by mouth daily.   Indication:  part of his hormonal therapy for transgendering     spironolactone 50 MG tablet  Commonly known as:  ALDACTONE  Take 1 tablet (50 mg total) by mouth daily.   Indication:  Edema           Follow-up Information   Follow up with [facility] On 10/31/2012. ( Monday, October 31, 2012 at [place])    Contact information:   [address] [place], [place]   [address]  [phone]      Follow-up recommendations:  Activity:  As tolerated Diet:  Regular  Comments:   Take all your medications as prescribed by your mental healthcare provider.  Report any adverse effects and or reactions from your medicines to your outpatient provider promptly.  Patient is instructed and cautioned to not engage in alcohol and or illegal drug use while on prescription medicines.  In the event of worsening symptoms, patient is instructed to call the crisis hotline, 911 and or go to the nearest ED for appropriate evaluation and treatment of symptoms.  Follow-up with your primary care provider for your other medical issues, concerns and or health care needs.   Total Discharge Time:  Greater than 30 minutes.  SignedFransisca Kaufmann NP-C 10/28/2012, 11:34 AM  Patient was personally examined, case discussed with the physician extender and treatment plan developed. Reviewed the information documented and agree with the treatment plan.  Tobin Cadiente,JANARDHAHA R. 10/28/2012 8:29 PM

## 2012-10-28 NOTE — [facility] Suicide Risk Assessment (Signed)
[facility] INPATIENT:  Family/Significant Other Suicide Prevention Education  Suicide Prevention Education:  Patient Refusal for Family/Significant Other Suicide Prevention Education: The patient Randy [name] has refused to provide written consent for family/significant other to be provided Family/Significant Other Suicide Prevention Education during admission and/or prior to discharge.  Physician notified.  No family involvement.  Wynn Banker 10/28/2012, 12:51 PM

## 2012-10-28 NOTE — Progress Notes (Signed)
Adult Unit Psychoeducational Group Note  Date:  10/28/2012 Time:  11:10 AM  Group Topic/Focus:  Patients engaged in therapeutic activity where they played coping skills pictionary. Each patient was asked to idenitify one coping skill they currently use and one they have learned from the activity.   Participation Level:  Active  Participation Quality:  Appropriate  Affect:  Appropriate  Cognitive:  Appropriate  Insight:  Good  Engagement in Group:  Engaged  Modes of Intervention:  Activity  Additional Comments: Canyon stated that one coping skill she has used in the past is "go for a walk". One coping skill she learned from the group was "talking it out with others".  Alyson Reedy 10/28/2012, 11:10 AM

## 2012-11-02 NOTE — Progress Notes (Signed)
Patient Discharge Instructions:  After Visit Summary (AVS):   Faxed to:  11/02/12 Discharge Summary Note:   Faxed to:  11/02/12 Psychiatric Admission Assessment Note:   Faxed to:  11/02/12 Suicide Risk Assessment - Discharge Assessment:   Faxed to:  11/02/12 Faxed/Sent to the Next Level Care provider:  11/02/12 Faxed to [facility] @ [phone]  Jerelene Redden, 11/02/2012, 3:19 PM

## 2012-11-17 ENCOUNTER — Ambulatory Visit (HOSPITAL_COMMUNITY)
Admission: RE | Admit: 2012-11-17 | Discharge: 2012-11-17 | Disposition: A | Discharge status: Home / Self Care | Payer: Medicare Other | Attending: Psychiatry | Admitting: Psychiatry

## 2012-11-17 ENCOUNTER — Encounter (HOSPITAL_COMMUNITY): Payer: Self-pay | Admitting: *Deleted

## 2012-11-17 DIAGNOSIS — F331 Major depressive disorder, recurrent, moderate: Secondary | ICD-10-CM | POA: Insufficient documentation

## 2012-11-17 DIAGNOSIS — F10229 Alcohol dependence with intoxication, unspecified: Secondary | ICD-10-CM | POA: Insufficient documentation

## 2012-11-17 DIAGNOSIS — F1420 Cocaine dependence, uncomplicated: Secondary | ICD-10-CM | POA: Insufficient documentation

## 2012-11-17 DIAGNOSIS — B1920 Unspecified viral hepatitis C without hepatic coma: Secondary | ICD-10-CM | POA: Insufficient documentation
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
## 2012-11-24 DIAGNOSIS — F332 Major depressive disorder, recurrent severe without psychotic features: Secondary | ICD-10-CM | POA: Diagnosis not present

## 2012-12-07 DIAGNOSIS — F640 Transsexualism: Secondary | ICD-10-CM | POA: Diagnosis not present

## 2013-02-10 ENCOUNTER — Encounter (HOSPITAL_COMMUNITY): Payer: Self-pay | Admitting: Emergency Medicine

## 2013-02-10 ENCOUNTER — Emergency Department (INDEPENDENT_AMBULATORY_CARE_PROVIDER_SITE_OTHER)
Admission: EM | Admit: 2013-02-10 | Discharge: 2013-02-10 | Disposition: A | Discharge status: Home / Self Care | Payer: Medicare Other | Source: Home / Self Care | Attending: Family Medicine | Admitting: Family Medicine

## 2013-02-10 DIAGNOSIS — F339 Major depressive disorder, recurrent, unspecified: Secondary | ICD-10-CM

## 2013-02-10 MED ORDER — ARIPIPRAZOLE 5 MG PO TABS: 5.0000 mg | ORAL_TABLET | Freq: Every day | ORAL | Status: DC

## 2013-02-10 MED ORDER — BUPROPION HCL ER (XL) 150 MG PO TB24: 150.0000 mg | ORAL_TABLET | Freq: Every day | ORAL | Status: DC

## 2013-02-10 MED ORDER — ESCITALOPRAM OXALATE 20 MG PO TABS: 20.0000 mg | ORAL_TABLET | Freq: Every day | ORAL | Status: DC

## 2013-02-10 NOTE — ED Notes (Signed)
Chart review.

## 2013-02-10 NOTE — ED Notes (Signed)
Reports has run out of medication one week ago.  Here today requesting mediciation

## 2013-02-10 NOTE — ED Provider Notes (Signed)
Randy [name] is a 42 y.o. male who presents to Urgent Care today for antidepression medication withdrawal. Patient ran out of his Abilify Lexapro Wellbutrin about one week ago. She does have insurance but has been unable to be seen in a telemetry manner by his usual psychiatrist. Her mental health history is complicated by history of substance abuse and inpatient hospitalizations. Her mental health medical care has been fragmented as result of this. She is currently not using drugs. She notes feeling anxious and fatigued as result of medication withdrawal. She denies any suicidal or homicidal thoughts but is concerned that these may develop in the future if she does not restart his medications. She thinks he may be seen by her primary psychiatrist in about one month. She feels well otherwise.   Past Medical History  Diagnosis Date  . Depression   . Hepatitis C   . 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 Cellulitis  Of  Left Forearm 01/10/2012    From IVDA   History  Substance Use Topics  . Smoking status: Current Every Day Smoker -- 1.00 packs/day for 20 years    Types: Cigarettes  . Smokeless tobacco: Never Used  . Alcohol Use: 0.0 oz/week     Comment: once every 1-2 months   ROS as above Medications reviewed. No current facility-administered medications for this encounter.   Current Outpatient Prescriptions  Medication Sig Dispense Refill  . ARIPiprazole (ABILIFY) 5 MG tablet Take 1 tablet (5 mg total) by mouth daily. For mood stability  30 tablet  1  . buPROPion (WELLBUTRIN XL) 150 MG 24 hr tablet Take 1 tablet (150 mg total) by mouth daily. For depression  30 tablet  1  . escitalopram (LEXAPRO) 20 MG tablet Take 1 tablet (20 mg total) by mouth daily. For depression.  30 tablet  1  . estradiol (ESTRACE) 2 MG tablet Take 1 tablet (2 mg total) by mouth 3 (three) times daily.  90 tablet  0  . finasteride (PROSCAR) 5 MG tablet Take 1 tablet (5 mg total) by mouth daily.  30  tablet  0  . spironolactone (ALDACTONE) 50 MG tablet Take 1 tablet (50 mg total) by mouth daily.  30 tablet  0  . traZODone (DESYREL) 50 MG tablet Take 1 tablet (50 mg total) by mouth at bedtime as needed and may repeat dose one time if needed for sleep.  60 tablet  0    Exam:  BP 108/60  Pulse 98  Temp(Src) 98 F (36.7 C) (Oral)  Resp 16  SpO2 100% Gen: Well NAD PSYCH: Alert and oriented. Normal thought process. Speech is linear and goal-directed. No SI or HI expressed.  No results found for this or any previous visit (from the past 24 hour(s)). No results found.  Assessment and Plan: 42 y.o. male with withdrawal of antidepression medication.  Plan to refill Abilify Wellbutrin and Lexapro.  Followup with primary psychiatrist ASAP Suicidal and homicidal precautions reviewed. Discussed warning signs or symptoms. Please see discharge instructions. Patient expresses understanding.      Rodolph Bong, MD 02/10/13 1335

## 2013-05-22 NOTE — ED Notes (Signed)
Called, asking for refill on medication. Advised she will need to be seen for any possible refills on medication

## 2013-05-23 ENCOUNTER — Emergency Department (INDEPENDENT_AMBULATORY_CARE_PROVIDER_SITE_OTHER)
Admission: EM | Admit: 2013-05-23 | Discharge: 2013-05-23 | Disposition: A | Discharge status: Home / Self Care | Payer: Medicare Other | Source: Home / Self Care | Attending: Family Medicine | Admitting: Family Medicine

## 2013-05-23 ENCOUNTER — Encounter (HOSPITAL_COMMUNITY): Payer: Self-pay | Admitting: Emergency Medicine

## 2013-05-23 DIAGNOSIS — IMO0002 Reserved for concepts with insufficient information to code with codable children: Secondary | ICD-10-CM | POA: Diagnosis not present

## 2013-05-23 DIAGNOSIS — F325 Major depressive disorder, single episode, in full remission: Secondary | ICD-10-CM

## 2013-05-23 MED ORDER — ESCITALOPRAM OXALATE 20 MG PO TABS: 20.0000 mg | ORAL_TABLET | Freq: Every day | ORAL | Status: DC

## 2013-05-23 NOTE — ED Provider Notes (Signed)
CSN: [id number]     Arrival date & time 05/23/13  [id number] History   First MD Initiated Contact with Patient 05/23/13 0912     Chief Complaint  Patient presents with  . Medication Refill   (Consider location/radiation/quality/duration/timing/severity/associated sxs/prior Treatment) Patient is a 43 y.o. male presenting with mental health disorder. The history is provided by the patient.  Mental Health Problem Presenting symptoms: depression   Presenting symptoms: no agitation and no suicidal thoughts   Presenting symptoms comment:  Here requesting lexapro refill, out of med for 3 d., otherwise doing well on med. Context: medication   Associated symptoms: no abdominal pain, no anxiety and no appetite change   Risk factors: hx of mental illness and recent psychiatric admission     Past Medical History  Diagnosis Date  . Depression   . Hepatitis C   . 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 Cellulitis  Of  Left Forearm 01/10/2012    From IVDA   Past Surgical History  Procedure Laterality Date  . I&d extremity  01/13/2012    Procedure: IRRIGATION AND DEBRIDEMENT EXTREMITY;  Surgeon: Kennieth RadArthur F Carter, MD;  Location: [facility];  Service: Orthopedics;  Laterality: Left;   Family History  Problem Relation Age of Onset  . Idiopathic pulmonary fibrosis Father    History  Substance Use Topics  . Smoking status: Current Every Day Smoker -- 1.00 packs/day for 20 years    Types: Cigarettes  . Smokeless tobacco: Never Used  . Alcohol Use: 0.0 oz/week     Comment: once every 1-2 months    Review of Systems  Constitutional: Negative for appetite change.  Gastrointestinal: Negative for abdominal pain.  Psychiatric/Behavioral: Negative for suicidal ideas, behavioral problems and agitation. The patient is not nervous/anxious.     Allergies  Penicillins  Home Medications   Current Outpatient Rx  Name  Route  Sig  Dispense  Refill  . ARIPiprazole (ABILIFY) 5 MG tablet   Oral  Take 1 tablet (5 mg total) by mouth daily. For mood stability   30 tablet   1   . buPROPion (WELLBUTRIN XL) 150 MG 24 hr tablet   Oral   Take 1 tablet (150 mg total) by mouth daily. For depression   30 tablet   1   . escitalopram (LEXAPRO) 20 MG tablet   Oral   Take 1 tablet (20 mg total) by mouth daily. For depression.   30 tablet   1   . estradiol (ESTRACE) 2 MG tablet   Oral   Take 1 tablet (2 mg total) by mouth 3 (three) times daily.   90 tablet   0   . traZODone (DESYREL) 50 MG tablet   Oral   Take 1 tablet (50 mg total) by mouth at bedtime as needed and may repeat dose one time if needed for sleep.   60 tablet   0   . escitalopram (LEXAPRO) 20 MG tablet   Oral   Take 1 tablet (20 mg total) by mouth daily.   30 tablet   1   . finasteride (PROSCAR) 5 MG tablet   Oral   Take 1 tablet (5 mg total) by mouth daily.   30 tablet   0   . spironolactone (ALDACTONE) 50 MG tablet   Oral   Take 1 tablet (50 mg total) by mouth daily.   30 tablet   0    BP 118/75  Pulse 69  Temp(Src) 97.9 F (36.6 C) (Oral)  SpO2 100% Physical Exam  Nursing note and vitals reviewed. Constitutional: He is oriented to person, place, and time. He appears well-developed and well-nourished.  Neurological: He is alert and oriented to person, place, and time.  Skin: Skin is warm and dry.  Psychiatric: He has a normal mood and affect. His behavior is normal. Judgment and thought content normal.    ED Course  Procedures (including critical care time) Labs Review Labs Reviewed - No data to display Imaging Review No results found.  EKG Interpretation    Date/Time:    Ventricular Rate:    PR Interval:    QRS Duration:   QT Interval:    QTC Calculation:   R Axis:     Text Interpretation:              MDM      Linna Hoff, MD 05/23/13 1004

## 2013-05-23 NOTE — Discharge Instructions (Signed)
See your doctor for further refills. °

## 2013-05-23 NOTE — ED Notes (Signed)
Pt needing refill on lexapro 20 mg.  Pt has been with out meds for three days.  States that if goes long with out them I go down hill fast.  States that she feels fine today voices no concerns.

## 2013-07-12 DIAGNOSIS — F411 Generalized anxiety disorder: Secondary | ICD-10-CM | POA: Diagnosis not present

## 2013-07-12 DIAGNOSIS — F331 Major depressive disorder, recurrent, moderate: Secondary | ICD-10-CM | POA: Diagnosis not present

## 2013-09-06 DIAGNOSIS — F411 Generalized anxiety disorder: Secondary | ICD-10-CM | POA: Diagnosis not present

## 2013-09-06 DIAGNOSIS — F331 Major depressive disorder, recurrent, moderate: Secondary | ICD-10-CM | POA: Diagnosis not present

## 2013-09-27 DIAGNOSIS — E288 Other ovarian dysfunction: Secondary | ICD-10-CM | POA: Diagnosis not present

## 2013-10-19 ENCOUNTER — Telehealth: Payer: Self-pay

## 2013-10-19 NOTE — Telephone Encounter (Signed)
Unable to reach patient.  Invalid number.

## 2013-10-20 ENCOUNTER — Ambulatory Visit (INDEPENDENT_AMBULATORY_CARE_PROVIDER_SITE_OTHER): Payer: Medicare Other | Admitting: Family Medicine

## 2013-10-20 ENCOUNTER — Encounter: Payer: Self-pay | Admitting: Family Medicine

## 2013-10-20 VITALS — BP 124/80 | HR 76 | Temp 98.1°F | Resp 16 | Ht 68.0 in | Wt 169.0 lb

## 2013-10-20 DIAGNOSIS — B1920 Unspecified viral hepatitis C without hepatic coma: Secondary | ICD-10-CM

## 2013-10-20 DIAGNOSIS — E349 Endocrine disorder, unspecified: Secondary | ICD-10-CM

## 2013-10-20 DIAGNOSIS — IMO0001 Reserved for inherently not codable concepts without codable children: Secondary | ICD-10-CM

## 2013-10-20 DIAGNOSIS — F1910 Other psychoactive substance abuse, uncomplicated: Secondary | ICD-10-CM

## 2013-10-20 DIAGNOSIS — F640 Transsexualism: Secondary | ICD-10-CM

## 2013-10-20 DIAGNOSIS — F332 Major depressive disorder, recurrent severe without psychotic features: Secondary | ICD-10-CM

## 2013-10-20 DIAGNOSIS — Z0184 Encounter for antibody response examination: Secondary | ICD-10-CM

## 2013-10-20 LAB — CBC WITH DIFFERENTIAL/PLATELET
BASOS PCT: 0.4 % (ref 0.0–3.0)
Basophils Absolute: 0 10*3/uL (ref 0.0–0.1)
EOS PCT: 1.2 % (ref 0.0–5.0)
Eosinophils Absolute: 0.1 10*3/uL (ref 0.0–0.7)
HEMATOCRIT: 45.3 % (ref 39.0–52.0)
Hemoglobin: 15.3 g/dL (ref 13.0–17.0)
LYMPHS ABS: 2.8 10*3/uL (ref 0.7–4.0)
Lymphocytes Relative: 29.7 % (ref 12.0–46.0)
MCHC: 33.7 g/dL (ref 30.0–36.0)
MCV: 92.8 fl (ref 78.0–100.0)
MONO ABS: 0.4 10*3/uL (ref 0.1–1.0)
Monocytes Relative: 4.7 % (ref 3.0–12.0)
Neutro Abs: 6 10*3/uL (ref 1.4–7.7)
Neutrophils Relative %: 64 % (ref 43.0–77.0)
Platelets: 142 10*3/uL — ABNORMAL LOW (ref 150.0–400.0)
RBC: 4.88 Mil/uL (ref 4.22–5.81)
RDW: 14 % (ref 11.5–15.5)
WBC: 9.4 10*3/uL (ref 4.0–10.5)

## 2013-10-20 LAB — BASIC METABOLIC PANEL
BUN: 13 mg/dL (ref 6–23)
CALCIUM: 8.9 mg/dL (ref 8.4–10.5)
CO2: 23 mEq/L (ref 19–32)
CREATININE: 1.1 mg/dL (ref 0.4–1.5)
Chloride: 107 mEq/L (ref 96–112)
GFR: 80.46 mL/min (ref >60.00)
Glucose, Bld: 86 mg/dL (ref 70–99)
Potassium: 4.2 mEq/L (ref 3.5–5.1)
Sodium: 136 mEq/L (ref 135–145)

## 2013-10-20 LAB — HEPATIC FUNCTION PANEL
ALT: 89 U/L — ABNORMAL HIGH (ref 0–53)
AST: 50 U/L — ABNORMAL HIGH (ref 0–37)
Albumin: 3.8 g/dL (ref 3.5–5.2)
Alkaline Phosphatase: 48 U/L (ref 39–117)
BILIRUBIN TOTAL: 0.3 mg/dL (ref 0.2–1.2)
Bilirubin, Direct: 0.1 mg/dL (ref 0.0–0.3)
Total Protein: 6.9 g/dL (ref 6.0–8.3)

## 2013-10-20 LAB — HEPATITIS B SURFACE ANTIBODY,QUALITATIVE: HEP B S AB: NEGATIVE

## 2013-10-20 LAB — HIV ANTIBODY (ROUTINE TESTING W REFLEX): HIV 1&2 Ab, 4th Generation: NONREACTIVE

## 2013-10-20 NOTE — Patient Instructions (Signed)
Schedule your complete physical in 3-4 months We'll notify you of your lab results and determine the next steps CONGRATS on your sobriety!!! Call with any questions or concerns Think of us as your home base!  Welcome!

## 2013-10-20 NOTE — Progress Notes (Signed)
   Subjective:    Patient ID: Randy [name], male    DOB: Sep 09, 1970, 43 y.o.   MRN: [id number]  HPI New to establish.  No recent PCP.  Depression- chronic problem, seeing Dr Jannifer FranklinAkintayo.  Taking Lexapro 30mg  and Klonopin prn.  No longer on Abilify or Wellbutrin.  Pt is currently not working- was previously a Systems analystsoftware developer.  Pt reports mental health issues date back to 4th grade.  Polysubstance abuse- went to treatment center last summer.  Not currently using.  'Never want to touch any of it ever again'.  Has OD'd twice.  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  Hormones are being managed by Dr Rosario Jacksonald Pittaway [facility])  Hep C- pt had + Hep C antibodies in ER in 2013.  [facility] but doesn't know what those results showed.   Review of Systems For ROS see HPI     Objective:   Physical Exam  Vitals reviewed. Constitutional: He is oriented to person, place, and time. He appears well-developed and well-nourished. No distress.  Disheveled, malodorous  HENT:  Head: Normocephalic and atraumatic.  Eyes: Conjunctivae and EOM are normal. Pupils are equal, round, and reactive to light.  Neck: Normal range of motion. Neck supple. No thyromegaly present.  Cardiovascular: Normal rate, regular rhythm, normal heart sounds and intact distal pulses.   No murmur heard. Pulmonary/Chest: Effort normal and breath sounds normal. No respiratory distress.  Abdominal: Soft. Bowel sounds are normal. He exhibits no distension.  Musculoskeletal: He exhibits no edema.  Lymphadenopathy:    He has no cervical adenopathy.  Neurological: He is alert and oriented to person, place, and time. No cranial nerve deficit.  Skin: Skin is warm and dry.  Psychiatric: He has a normal mood and affect. His behavior is normal.          Assessment & Plan:

## 2013-10-20 NOTE — Progress Notes (Signed)
Pre visit review using our clinic review tool, if applicable. No additional management support is needed unless otherwise documented below in the visit note. 

## 2013-10-21 ENCOUNTER — Telehealth: Payer: Self-pay | Admitting: Family Medicine

## 2013-10-21 NOTE — Telephone Encounter (Signed)
Relevant patient education assigned to patient using Emmi. ° °

## 2013-10-22 NOTE — Assessment & Plan Note (Signed)
New to provider.  Pt is unclear if he does in fact have Hep C.  Preliminary testing was + but no record of confirmatory testing.  Check labs.  Refer to Hep C clinic prn.  Pt expressed understanding and is in agreement w/ plan.

## 2013-10-22 NOTE — Assessment & Plan Note (Signed)
New to provider, recurrent problem for pt.  Reports he's 'finally decided, I don't want to die'.  States he has 'no desire to do any of that ever again'.  Will follow.

## 2013-10-22 NOTE — Assessment & Plan Note (Signed)
New to provider, following w/ Psych.  Pt has long history of mental illness dating back to the 4th grade.  Will follow along and assist as able.

## 2013-10-22 NOTE — Assessment & Plan Note (Signed)
New to provider.  [facility] [place] [facility] [facility] [place] [facility]  Will follow pt's transition and be supportive

## 2013-10-23 LAB — HEPATITIS C VRS RNA DETECT BY PCR-QUAL: Hepatitis C Vrs RNA by PCR-Qual: POSITIVE — AB

## 2013-10-24 ENCOUNTER — Encounter: Payer: Self-pay | Admitting: General Practice

## 2013-10-24 ENCOUNTER — Other Ambulatory Visit: Payer: Self-pay | Admitting: Family Medicine

## 2013-10-24 DIAGNOSIS — R7989 Other specified abnormal findings of blood chemistry: Secondary | ICD-10-CM

## 2013-10-24 DIAGNOSIS — B1920 Unspecified viral hepatitis C without hepatic coma: Secondary | ICD-10-CM

## 2013-10-24 DIAGNOSIS — R945 Abnormal results of liver function studies: Secondary | ICD-10-CM

## 2013-11-06 ENCOUNTER — Other Ambulatory Visit: Payer: Self-pay

## 2013-11-13 ENCOUNTER — Other Ambulatory Visit (INDEPENDENT_AMBULATORY_CARE_PROVIDER_SITE_OTHER): Payer: Medicare Other

## 2013-11-13 DIAGNOSIS — B182 Chronic viral hepatitis C: Secondary | ICD-10-CM

## 2013-11-13 LAB — COMPREHENSIVE METABOLIC PANEL
ALK PHOS: 53 U/L (ref 39–117)
ALT: 109 U/L — ABNORMAL HIGH (ref 0–53)
AST: 54 U/L — ABNORMAL HIGH (ref 0–37)
Albumin: 4.3 g/dL (ref 3.5–5.2)
BILIRUBIN TOTAL: 0.4 mg/dL (ref 0.2–1.2)
BUN: 11 mg/dL (ref 6–23)
CO2: 26 mEq/L (ref 19–32)
CREATININE: 0.99 mg/dL (ref 0.50–1.35)
Calcium: 9.1 mg/dL (ref 8.4–10.5)
Chloride: 103 mEq/L (ref 96–112)
GLUCOSE: 73 mg/dL (ref 70–99)
Potassium: 4.2 mEq/L (ref 3.5–5.3)
SODIUM: 139 meq/L (ref 135–145)
TOTAL PROTEIN: 6.6 g/dL (ref 6.0–8.3)

## 2013-11-13 LAB — PROTIME-INR
INR: 0.91 (ref <1.50)
Prothrombin Time: 12.3 seconds (ref 11.6–15.2)

## 2013-11-13 LAB — IRON: Iron: 96 ug/dL (ref 42–165)

## 2013-11-13 NOTE — Addendum Note (Signed)
Addended by: Mariea ClontsGREEN, Erian Lariviere D on: 11/13/2013 10:29 AM   Modules accepted: Orders

## 2013-11-14 LAB — ANA: ANA: NEGATIVE

## 2013-11-14 LAB — HEPATITIS C RNA QUANTITATIVE
HCV QUANT LOG: 6.59 {Log} — AB (ref <1.18)
HCV QUANT: 3900304 [IU]/mL — AB (ref <15)

## 2013-11-14 LAB — HEPATITIS A ANTIBODY, TOTAL: HEP A TOTAL AB: NONREACTIVE

## 2013-11-14 LAB — HEPATITIS B CORE ANTIBODY, TOTAL: HEP B C TOTAL AB: NONREACTIVE

## 2013-11-14 LAB — HEPATITIS B SURFACE ANTIGEN: HEP B S AG: NEGATIVE

## 2013-11-16 LAB — HEPATITIS C GENOTYPE

## 2013-11-25 ENCOUNTER — Ambulatory Visit (HOSPITAL_COMMUNITY)
Admission: AD | Admit: 2013-11-25 | Discharge: 2013-11-25 | Disposition: A | Discharge status: Home / Self Care | Payer: Medicare Other | Source: Home / Self Care | Attending: Psychiatry | Admitting: Psychiatry

## 2013-11-25 ENCOUNTER — Emergency Department (HOSPITAL_COMMUNITY)
Admission: AD | Admit: 2013-11-25 | Discharge: 2013-11-26 | Disposition: A | Discharge status: Home / Self Care | Payer: Medicare Other | Source: Intra-hospital | Attending: Psychiatry | Admitting: Psychiatry

## 2013-11-25 ENCOUNTER — Encounter (HOSPITAL_COMMUNITY): Payer: Self-pay | Admitting: Emergency Medicine

## 2013-11-25 DIAGNOSIS — F4310 Post-traumatic stress disorder, unspecified: Secondary | ICD-10-CM | POA: Insufficient documentation

## 2013-11-25 DIAGNOSIS — F329 Major depressive disorder, single episode, unspecified: Secondary | ICD-10-CM | POA: Insufficient documentation

## 2013-11-25 DIAGNOSIS — F3289 Other specified depressive episodes: Secondary | ICD-10-CM | POA: Insufficient documentation

## 2013-11-25 DIAGNOSIS — F32A Depression, unspecified: Secondary | ICD-10-CM

## 2013-11-25 DIAGNOSIS — Z880 Allergy status to penicillin: Secondary | ICD-10-CM | POA: Insufficient documentation

## 2013-11-25 DIAGNOSIS — Z872 Personal history of diseases of the skin and subcutaneous tissue: Secondary | ICD-10-CM | POA: Insufficient documentation

## 2013-11-25 DIAGNOSIS — F17200 Nicotine dependence, unspecified, uncomplicated: Secondary | ICD-10-CM | POA: Insufficient documentation

## 2013-11-25 DIAGNOSIS — F39 Unspecified mood [affective] disorder: Secondary | ICD-10-CM | POA: Insufficient documentation

## 2013-11-25 DIAGNOSIS — Z8619 Personal history of other infectious and parasitic diseases: Secondary | ICD-10-CM | POA: Insufficient documentation

## 2013-11-25 DIAGNOSIS — Z862 Personal history of diseases of the blood and blood-forming organs and certain disorders involving the immune mechanism: Secondary | ICD-10-CM | POA: Insufficient documentation

## 2013-11-25 DIAGNOSIS — Z008 Encounter for other general examination: Secondary | ICD-10-CM | POA: Insufficient documentation

## 2013-11-25 DIAGNOSIS — Z79899 Other long term (current) drug therapy: Secondary | ICD-10-CM | POA: Insufficient documentation

## 2013-11-25 DIAGNOSIS — Z8639 Personal history of other endocrine, nutritional and metabolic disease: Secondary | ICD-10-CM | POA: Insufficient documentation

## 2013-11-25 DIAGNOSIS — F3130 Bipolar disorder, current episode depressed, mild or moderate severity, unspecified: Secondary | ICD-10-CM | POA: Diagnosis not present

## 2013-11-25 LAB — COMPREHENSIVE METABOLIC PANEL
ALBUMIN: 4.2 g/dL (ref 3.5–5.2)
ALK PHOS: 60 U/L (ref 39–117)
ALT: 79 U/L — AB (ref 0–53)
AST: 43 U/L — AB (ref 0–37)
Anion gap: 14 (ref 5–15)
BUN: 9 mg/dL (ref 6–23)
CALCIUM: 9.9 mg/dL (ref 8.4–10.5)
CO2: 20 mEq/L (ref 19–32)
Chloride: 102 mEq/L (ref 96–112)
Creatinine, Ser: 1 mg/dL (ref 0.50–1.35)
GFR calc Af Amer: >90 mL/min (ref >90)
GFR calc non Af Amer: >90 mL/min (ref >90)
Glucose, Bld: 149 mg/dL — ABNORMAL HIGH (ref 70–99)
POTASSIUM: 4 meq/L (ref 3.7–5.3)
SODIUM: 136 meq/L — AB (ref 137–147)
TOTAL PROTEIN: 8 g/dL (ref 6.0–8.3)
Total Bilirubin: 0.8 mg/dL (ref 0.3–1.2)

## 2013-11-25 LAB — RAPID URINE DRUG SCREEN, HOSP PERFORMED
AMPHETAMINES: NOT DETECTED
Barbiturates: NOT DETECTED
Benzodiazepines: NOT DETECTED
COCAINE: NOT DETECTED
OPIATES: NOT DETECTED
TETRAHYDROCANNABINOL: NOT DETECTED

## 2013-11-25 LAB — CBC
HCT: 50 % (ref 39.0–52.0)
HEMOGLOBIN: 17 g/dL (ref 13.0–17.0)
MCH: 31.4 pg (ref 26.0–34.0)
MCHC: 34 g/dL (ref 30.0–36.0)
MCV: 92.3 fL (ref 78.0–100.0)
PLATELETS: 156 10*3/uL (ref 150–400)
RBC: 5.42 MIL/uL (ref 4.22–5.81)
RDW: 13 % (ref 11.5–15.5)
WBC: 8.1 10*3/uL (ref 4.0–10.5)

## 2013-11-25 LAB — SALICYLATE LEVEL

## 2013-11-25 LAB — ETHANOL: Alcohol, Ethyl (B): <11 mg/dL (ref 0–11)

## 2013-11-25 LAB — ACETAMINOPHEN LEVEL

## 2013-11-25 MED ORDER — IBUPROFEN 200 MG PO TABS: 600.0000 mg | ORAL_TABLET | Freq: Three times a day (TID) | ORAL | Status: DC | PRN

## 2013-11-25 MED ORDER — TRAZODONE HCL 50 MG PO TABS: 50.0000 mg | ORAL_TABLET | Freq: Every evening | ORAL | Status: DC | PRN

## 2013-11-25 MED ORDER — ONDANSETRON HCL 4 MG PO TABS: 4.0000 mg | ORAL_TABLET | Freq: Three times a day (TID) | ORAL | Status: DC | PRN

## 2013-11-25 MED ORDER — ZOLPIDEM TARTRATE 5 MG PO TABS: 5.0000 mg | ORAL_TABLET | Freq: Every evening | ORAL | Status: DC | PRN

## 2013-11-25 MED ORDER — SPIRONOLACTONE 50 MG PO TABS
50.0000 mg | ORAL_TABLET | Freq: Every day | ORAL | Status: DC
  Administered 2013-11-26: 50 mg via ORAL
  Filled 2013-11-25: qty 1

## 2013-11-25 MED ORDER — CLONAZEPAM 0.5 MG PO TABS: 0.5000 mg | ORAL_TABLET | Freq: Two times a day (BID) | ORAL | Status: DC | PRN

## 2013-11-25 MED ORDER — FINASTERIDE 5 MG PO TABS
5.0000 mg | ORAL_TABLET | Freq: Every day | ORAL | Status: DC
  Administered 2013-11-26: 5 mg via ORAL
  Filled 2013-11-25: qty 1

## 2013-11-25 MED ORDER — ESCITALOPRAM OXALATE 10 MG PO TABS
30.0000 mg | ORAL_TABLET | Freq: Every day | ORAL | Status: DC
  Administered 2013-11-26: 30 mg via ORAL
  Filled 2013-11-25: qty 3

## 2013-11-25 MED ORDER — ACETAMINOPHEN 325 MG PO TABS: 650.0000 mg | ORAL_TABLET | ORAL | Status: DC | PRN

## 2013-11-25 MED ORDER — NICOTINE 21 MG/24HR TD PT24: 21.0000 mg | MEDICATED_PATCH | Freq: Every day | TRANSDERMAL | Status: DC

## 2013-11-25 MED ORDER — BUPROPION HCL ER (XL) 150 MG PO TB24
150.0000 mg | ORAL_TABLET | Freq: Every day | ORAL | Status: DC
  Filled 2013-11-25: qty 1

## 2013-11-25 MED ORDER — ESTRADIOL 1 MG PO TABS
2.0000 mg | ORAL_TABLET | Freq: Three times a day (TID) | ORAL | Status: DC
  Administered 2013-11-26 (×2): 2 mg via ORAL
  Filled 2013-11-25 (×3): qty 2

## 2013-11-25 MED ORDER — ALUM & MAG HYDROXIDE-SIMETH 200-200-20 MG/5ML PO SUSP: 30.0000 mL | ORAL | Status: DC | PRN

## 2013-11-25 NOTE — ED Notes (Signed)
[place]oke to [place], he states [facility]. Patient will need placement.

## 2013-11-25 NOTE — ED Notes (Signed)
[facility] for worsening depression, difficulty sleeping and difficulty concentrating for the past few days. Patient had a medication change "awhile ago" and believes the current combo is not working effectively. [facility].

## 2013-11-25 NOTE — ED Notes (Signed)
Attempted to talk to patient.  Patient was sleeping in hall way and was woken with a tap  On the knee.  Patient began to become aggravated when she was questioned about why  She was in the ED.  Patient refused to answer any questions.

## 2013-11-25 NOTE — Consult Note (Signed)
  TTS Assess-pt well known to TTS/never hospitalized in past.Recent incident at home with police triggered PTSD and pt feels she is decompensating . There is history of substance abuse but UDS in ED is negative.She has no HI/SI.Marland Kitchen. She reports this has happened before and switching her from klonopin to abilify has resolved the problem.  Obs unit is closed tonite.Will Observe in ED and have psych MD eval in AM for med change with eye toward D/C with OP FU.

## 2013-11-25 NOTE — ED Notes (Signed)
Pt unable to void at this time. 

## 2013-11-25 NOTE — ED Provider Notes (Signed)
Medical screening examination/treatment/procedure(s) were performed by non-physician practitioner and as supervising physician I was immediately available for consultation/collaboration.   EKG Interpretation None        Lyanne CoKevin M Sharesa Kemp, MD 11/25/13 [phone]

## 2013-11-25 NOTE — ED Provider Notes (Signed)
CSN: [id number]     Arrival date & time 11/25/13  2026 History  This chart was scribed for non-physician practitioner, Marlon Pel, PA-C working with Lyanne Co, MD by Greggory Stallion, ED scribe. This patient was seen in room [facility] and the patient's care was started at 9:02 PM.   Chief Complaint  Patient presents with  . Medical Clearance    accepted at [facility] pending med clear   The history is provided by the patient. No language interpreter was used.   HPI Comments: Randy [name] is a 43 y.o. male who presents to the Emergency Department for medical clearance. Pt is transgender and prefers to be identified as male. Pt went to [facility] for worsening depression, difficulty sleeping and concentrating. She has been accepted there but needs medical clearance first. States she had a medication change a while ago and doesn't think the combination is very effective. Denies SI/HI currently. States she has not recent social stressors. Denies alcohol use. Pt reports opiate use once a few weeks ago but denies other drug use. She has PTSD and feels that her trigger into this depression has stemmed from when the police knocked on her door a while ago to question her about someone she didn't know.  Past Medical History  Diagnosis Date  . Depression   . Hepatitis C   . 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 Cellulitis  Of  Left Forearm 01/10/2012    From IVDA   Past Surgical History  Procedure Laterality Date  . I&d extremity  01/13/2012    Procedure: IRRIGATION AND DEBRIDEMENT EXTREMITY;  Surgeon: Kennieth Rad, MD;  Location: [facility];  Service: Orthopedics;  Laterality: Left;   Family History  Problem Relation Age of Onset  . Idiopathic pulmonary fibrosis Father    History  Substance Use Topics  . Smoking status: Current Every Day Smoker -- 0.50 packs/day for 20 years    Types: Cigarettes  . Smokeless tobacco: Never Used  . Alcohol Use: 0.0 oz/week     Comment: once every 1-2  months    Review of Systems  Psychiatric/Behavioral: Positive for dysphoric mood. Negative for suicidal ideas.  All other systems reviewed and are negative.  Allergies  Penicillins  Home Medications   Prior to Admission medications   Medication Sig Start Date End Date Taking? Authorizing Provider  buPROPion (WELLBUTRIN XL) 150 MG 24 hr tablet Take 1 tablet (150 mg total) by mouth daily. For depression 02/10/13  Yes Rodolph Bong, MD  clonazePAM (KLONOPIN) 0.5 MG tablet Take 0.5 mg by mouth 2 (two) times daily as needed for anxiety (anxiety).   Yes Historical Provider, MD  escitalopram (LEXAPRO) 20 MG tablet Take 30 mg by mouth daily. For depression. 02/10/13  Yes Rodolph Bong, MD  estradiol (ESTRACE) 2 MG tablet Take 1 tablet (2 mg total) by mouth 3 (three) times daily. 10/28/12  Yes Fransisca Kaufmann, NP  finasteride (PROSCAR) 5 MG tablet Take 1 tablet (5 mg total) by mouth daily. 10/28/12  Yes Fransisca Kaufmann, NP  spironolactone (ALDACTONE) 50 MG tablet Take 1 tablet (50 mg total) by mouth daily. 10/28/12  Yes Fransisca Kaufmann, NP  traZODone (DESYREL) 50 MG tablet Take 1 tablet (50 mg total) by mouth at bedtime as needed and may repeat dose one time if needed for sleep. 10/28/12  Yes Fransisca Kaufmann, NP   BP 116/79  Pulse 65  Temp(Src) 98.4 F (36.9 C) (Oral)  Resp 20  Ht 5\' 6"  (1.676  m)  Wt 160 lb (72.576 kg)  BMI 25.84 kg/m2  SpO2 100%  Physical Exam  Nursing note and vitals reviewed. Constitutional: He is oriented to person, place, and time. He appears well-developed and well-nourished. No distress.  Male to male transgender.  HENT:  Head: Normocephalic and atraumatic.  Eyes: Conjunctivae and EOM are normal.  Neck: Neck supple. No tracheal deviation present.  Cardiovascular: Normal rate.   Pulmonary/Chest: Effort normal. No respiratory distress.  Musculoskeletal: Normal range of motion.  Neurological: He is alert and oriented to person, place, and time.  Skin: Skin is warm and dry.   Psychiatric: His behavior is normal. His speech is delayed. He is not actively hallucinating. He exhibits a depressed mood. He expresses no homicidal and no suicidal ideation.    ED Course  Procedures (including critical care time)  DIAGNOSTIC STUDIES: Oxygen Saturation is 100% on RA, normal by my interpretation.    COORDINATION OF CARE: 9:02 PM-Discussed treatment plan which includes lab work with pt at bedside and pt agreed to plan.   Labs Review Labs Reviewed  COMPREHENSIVE METABOLIC PANEL - Abnormal; Notable for the following:    Sodium 136 (*)    Glucose, Bld 149 (*)    AST 43 (*)    ALT 79 (*)    All other components within normal limits  SALICYLATE LEVEL - Abnormal; Notable for the following:    Salicylate Lvl <2.0 (*)    All other components within normal limits  ACETAMINOPHEN LEVEL  CBC  ETHANOL  URINE RAPID DRUG SCREEN (HOSP PERFORMED)    Imaging Review No results found.   EKG Interpretation None      MDM   Final diagnoses:  Depression   Medical clearance labs ordered and have resulted showing no significantly abnormal lab values. She reports that an appointment is coming up with provider to treat her recently diagnosed Hep C.   [facility]. [facility]    I personally performed the services described in this documentation, which was scribed in my presence. The recorded information has been reviewed and is accurate.  Dorthula Matasiffany G Tristain Daily, PA-C 11/25/13 2326
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
## 2013-11-26 ENCOUNTER — Observation Stay (HOSPITAL_BASED_OUTPATIENT_CLINIC_OR_DEPARTMENT_OTHER)
Admission: AD | Admit: 2013-11-26 | Discharge: 2013-11-27 | Disposition: A | Discharge status: Home / Self Care | Payer: Medicare Other | Source: Intra-hospital | Attending: Psychiatry | Admitting: Psychiatry

## 2013-11-26 ENCOUNTER — Encounter (HOSPITAL_COMMUNITY): Payer: Self-pay | Admitting: *Deleted

## 2013-11-26 DIAGNOSIS — F330 Major depressive disorder, recurrent, mild: Secondary | ICD-10-CM

## 2013-11-26 DIAGNOSIS — F319 Bipolar disorder, unspecified: Secondary | ICD-10-CM | POA: Diagnosis present

## 2013-11-26 DIAGNOSIS — F339 Major depressive disorder, recurrent, unspecified: Secondary | ICD-10-CM | POA: Diagnosis present

## 2013-11-26 DIAGNOSIS — F3130 Bipolar disorder, current episode depressed, mild or moderate severity, unspecified: Secondary | ICD-10-CM

## 2013-11-26 DIAGNOSIS — F419 Anxiety disorder, unspecified: Secondary | ICD-10-CM

## 2013-11-26 MED ORDER — FINASTERIDE 5 MG PO TABS
5.0000 mg | ORAL_TABLET | Freq: Every day | ORAL | Status: DC
  Administered 2013-11-27: 5 mg via ORAL
  Filled 2013-11-26 (×3): qty 1

## 2013-11-26 MED ORDER — BUPROPION HCL ER (XL) 150 MG PO TB24
150.0000 mg | ORAL_TABLET | Freq: Every day | ORAL | Status: DC
  Filled 2013-11-26: qty 1
  Filled 2013-11-26: qty 3
  Filled 2013-11-26 (×2): qty 1

## 2013-11-26 MED ORDER — ACETAMINOPHEN 325 MG PO TABS: 650.0000 mg | ORAL_TABLET | Freq: Four times a day (QID) | ORAL | Status: DC | PRN

## 2013-11-26 MED ORDER — SPIRONOLACTONE 50 MG PO TABS
50.0000 mg | ORAL_TABLET | Freq: Every day | ORAL | Status: DC
  Administered 2013-11-27: 50 mg via ORAL
  Filled 2013-11-26: qty 2
  Filled 2013-11-26: qty 1
  Filled 2013-11-26: qty 2
  Filled 2013-11-26: qty 1

## 2013-11-26 MED ORDER — HYDROXYZINE HCL 25 MG PO TABS: 25.0000 mg | ORAL_TABLET | Freq: Four times a day (QID) | ORAL | Status: DC | PRN

## 2013-11-26 MED ORDER — MAGNESIUM HYDROXIDE 400 MG/5ML PO SUSP: 30.0000 mL | Freq: Every day | ORAL | Status: DC | PRN

## 2013-11-26 MED ORDER — TRAZODONE HCL 50 MG PO TABS: 50.0000 mg | ORAL_TABLET | Freq: Every evening | ORAL | Status: DC | PRN

## 2013-11-26 MED ORDER — ARIPIPRAZOLE 5 MG PO TABS
5.0000 mg | ORAL_TABLET | Freq: Every day | ORAL | Status: DC
  Administered 2013-11-26: 5 mg via ORAL
  Filled 2013-11-26: qty 1

## 2013-11-26 MED ORDER — ESTRADIOL 2 MG PO TABS
2.0000 mg | ORAL_TABLET | Freq: Three times a day (TID) | ORAL | Status: DC
  Administered 2013-11-27 (×2): 2 mg via ORAL
  Filled 2013-11-26 (×3): qty 1
  Filled 2013-11-26: qty 2
  Filled 2013-11-26 (×2): qty 1

## 2013-11-26 MED ORDER — ALUM & MAG HYDROXIDE-SIMETH 200-200-20 MG/5ML PO SUSP: 30.0000 mL | ORAL | Status: DC | PRN

## 2013-11-26 NOTE — ED Notes (Signed)
Asked pt if i could do vitals.  Pt did not agree.  [facility]

## 2013-11-26 NOTE — Plan of Care (Signed)
[facility] Observation Crisis Plan  Reason for Crisis Plan:  Crisis Stabilization   Plan of Care:  Referral for Telepsychiatry/Psychiatric Consult  Family Support:    mother  Current Living Environment:  Living Arrangements: Alone  Insurance:   Hospital Account   Name Acct ID Class Status Primary Coverage   Randy [name], Randy [name] [id number] BEHAVIORAL HEALTH OBSERVATION Open MEDICARE - MEDICARE PART A AND B        Guarantor Account (for Hospital Account [id number])   Name Relation to Pt Service Area Active? Acct Type   Randy [name], Randy [name] Self [facility] Yes [facility]   Address Phone       [address] [place], [place] [id number] [phone])          Coverage Information (for Hospital Account [id number])   F/O Payor/Plan Precert #   MEDICARE/MEDICARE PART A AND B    Subscriber Subscriber #   Randy [name], Randy [name] [id number]   Address Phone   [address] [place], [place] [id number]       Legal Guardian:   self  Primary Care Provider:  Neena RhymesKatherine Tabori, MD  Current Outpatient Providers:  [facility]  Psychiatrist:   Dr. Claudine MoutonAkintoya  Counselor/Therapist:     Compliant with Medications:  Yes  Additional Information:   Earleen Newportbekwe, Eric Nees B 7/19/201511:06 PM

## 2013-11-26 NOTE — ED Provider Notes (Signed)
Per TTS,  the patient is being admitted to observational status, but will stay in the psychiatric unit here in the emergency department.  Vicent Febles L WeFlint Melterntz, MD 11/26/13 [phone]

## 2013-11-26 NOTE — ED Notes (Signed)
Patient offered blanket and pillow. Patient declined.

## 2013-11-26 NOTE — Consult Note (Signed)
[facility] Face-to-Face Psychiatry Consult   Reason for Consult:  Anxiety, "confused" Referring Physician:  EDP Randy [name] is an 43 y.o. male. Total Time spent with patient: 30 minutes  Assessment: AXIS I:  Bipolar, Depressed AXIS II:  Deferred AXIS III:   Past Medical History  Diagnosis Date  . Depression   . Hepatitis C   . 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 Cellulitis  Of  Left Forearm 01/10/2012    From IVDA   AXIS IV:  other psychosocial or environmental problems AXIS V:  51-60 moderate symptoms  Plan:  No evidence of imminent risk to self or others at present.   Patient does not meet criteria for psychiatric inpatient admission.  Subjective:   Randy [name] is a 43 y.o. male patient admitted with anxiety and depression.  HPI: Patient is a 43 year old transgender person presenting to ER with Confusion. Apparently police came to his home last night and wanted to search it. Patient refused but became upset, agitated and unable to sleep.She sees Dr. Darleene [name] and feels like med needs to be changed to Abilify for anxiety. She denies suicidal or homicidal ideation and claims not to have used drugs or alcohol recently. UDS is negative.She had been woken up from sleep and is very drowsy HPI Elements:   Location:  generalized. Quality:  moderate. Severity:  moderate. Timing:  one day. Duration:  months. Context:  search of home.  Past Psychiatric History: Past Medical History  Diagnosis Date  . Depression   . Hepatitis C   . 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 Cellulitis  Of  Left Forearm 01/10/2012    From IVDA    reports that he has been smoking Cigarettes.  He has a 10 pack-year smoking history. He has never used smokeless tobacco. He reports that he drinks alcohol. He reports that he uses illicit drugs (Heroin and "Crack" cocaine). Family History  Problem Relation Age of Onset  . Idiopathic pulmonary fibrosis Father            Allergies:    Allergies  Allergen Reactions  . Penicillins Other (See Comments)    Swollen joints     ACT Assessment Complete:   Objective: Blood pressure 96/61, pulse 79, temperature 98.4 F (36.9 C), temperature source Oral, resp. rate 16, height '5\' 6"'  (1.676 m), weight 160 lb (72.576 kg), SpO2 99.00%.Body mass index is 25.84 kg/(m^2). Results for orders placed during the hospital encounter of 11/25/13 (from the past 72 hour(s))  ACETAMINOPHEN LEVEL     Status: None   Collection Time    11/25/13  8:52 PM      Result Value Ref Range   Acetaminophen (Tylenol), Serum <15.0  10 - 30 ug/mL   Comment:            THERAPEUTIC CONCENTRATIONS VARY     SIGNIFICANTLY. A RANGE OF 10-30     ug/mL MAY BE AN EFFECTIVE     CONCENTRATION FOR MANY PATIENTS.     HOWEVER, SOME ARE BEST TREATED     AT CONCENTRATIONS OUTSIDE THIS     RANGE.     ACETAMINOPHEN CONCENTRATIONS     >150 ug/mL AT 4 HOURS AFTER     INGESTION AND >50 ug/mL AT 12     HOURS AFTER INGESTION ARE     OFTEN ASSOCIATED WITH TOXIC     REACTIONS.  CBC     Status: None   Collection Time    11/25/13  8:52 PM      Result Value Ref Range   WBC 8.1  4.0 - 10.5 K/uL   RBC 5.42  4.22 - 5.81 MIL/uL   Hemoglobin 17.0  13.0 - 17.0 g/dL   HCT 50.0  39.0 - 52.0 %   MCV 92.3  78.0 - 100.0 fL   MCH 31.4  26.0 - 34.0 pg   MCHC 34.0  30.0 - 36.0 g/dL   RDW 13.0  11.5 - 15.5 %   Platelets 156  150 - 400 K/uL  COMPREHENSIVE METABOLIC PANEL     Status: Abnormal   Collection Time    11/25/13  8:52 PM      Result Value Ref Range   Sodium 136 (*) 137 - 147 mEq/L   Potassium 4.0  3.7 - 5.3 mEq/L   Chloride 102  96 - 112 mEq/L   CO2 20  19 - 32 mEq/L   Glucose, Bld 149 (*) 70 - 99 mg/dL   BUN 9  6 - 23 mg/dL   Creatinine, Ser 1.00  0.50 - 1.35 mg/dL   Calcium 9.9  8.4 - 10.5 mg/dL   Total Protein 8.0  6.0 - 8.3 g/dL   Albumin 4.2  3.5 - 5.2 g/dL   AST 43 (*) 0 - 37 U/L   ALT 79 (*) 0 - 53 U/L   Alkaline Phosphatase 60  39 - 117 U/L   Total  Bilirubin 0.8  0.3 - 1.2 mg/dL   GFR calc non Af Amer >90  >90 mL/min   GFR calc Af Amer >90  >90 mL/min   Comment: (NOTE)     The eGFR has been calculated using the CKD EPI equation.     This calculation has not been validated in all clinical situations.     eGFR's persistently <90 mL/min signify possible Chronic Kidney     Disease.   Anion gap 14  5 - 15  ETHANOL     Status: None   Collection Time    11/25/13  8:52 PM      Result Value Ref Range   Alcohol, Ethyl (B) <11  0 - 11 mg/dL   Comment:            LOWEST DETECTABLE LIMIT FOR     SERUM ALCOHOL IS 11 mg/dL     FOR MEDICAL PURPOSES ONLY  SALICYLATE LEVEL     Status: Abnormal   Collection Time    11/25/13  8:52 PM      Result Value Ref Range   Salicylate Lvl <4.0 (*) 2.8 - 20.0 mg/dL  URINE RAPID DRUG SCREEN (HOSP PERFORMED)     Status: None   Collection Time    11/25/13 10:59 PM      Result Value Ref Range   Opiates NONE DETECTED  NONE DETECTED   Cocaine NONE DETECTED  NONE DETECTED   Benzodiazepines NONE DETECTED  NONE DETECTED   Amphetamines NONE DETECTED  NONE DETECTED   Tetrahydrocannabinol NONE DETECTED  NONE DETECTED   Barbiturates NONE DETECTED  NONE DETECTED   Comment:            DRUG SCREEN FOR MEDICAL PURPOSES     ONLY.  IF CONFIRMATION IS NEEDED     FOR ANY PURPOSE, NOTIFY LAB     WITHIN 5 DAYS.                LOWEST DETECTABLE LIMITS     FOR URINE DRUG SCREEN  Drug Class       Cutoff (ng/mL)     Amphetamine      1000     Barbiturate      200     Benzodiazepine   856     Tricyclics       314     Opiates          300     Cocaine          300     THC              50   Labs are reviewed and are pertinent for ongoing medical issues  Current Facility-Administered Medications  Medication Dose Route Frequency Provider Last Rate Last Dose  . acetaminophen (TYLENOL) tablet 650 mg  650 mg Oral Q4H PRN Linus Mako, PA-C      . alum & mag hydroxide-simeth (MAALOX/MYLANTA) 200-200-20 MG/5ML  suspension 30 mL  30 mL Oral PRN Linus Mako, PA-C      . buPROPion (WELLBUTRIN XL) 24 hr tablet 150 mg  150 mg Oral Daily Tiffany Marilu Favre, PA-C      . clonazePAM (KLONOPIN) tablet 0.5 mg  0.5 mg Oral BID PRN Linus Mako, PA-C      . escitalopram (LEXAPRO) tablet 30 mg  30 mg Oral Daily Linus Mako, PA-C   30 mg at 11/26/13 0908  . estradiol (ESTRACE) tablet 2 mg  2 mg Oral TID Linus Mako, PA-C   2 mg at 11/26/13 0908  . finasteride (PROSCAR) tablet 5 mg  5 mg Oral Daily Linus Mako, PA-C   5 mg at 11/26/13 0908  . ibuprofen (ADVIL,MOTRIN) tablet 600 mg  600 mg Oral Q8H PRN Linus Mako, PA-C      . nicotine (NICODERM CQ - dosed in mg/24 hours) patch 21 mg  21 mg Transdermal Daily Tiffany Marilu Favre, PA-C      . ondansetron (ZOFRAN) tablet 4 mg  4 mg Oral Q8H PRN Linus Mako, PA-C      . spironolactone (ALDACTONE) tablet 50 mg  50 mg Oral Daily Linus Mako, PA-C   50 mg at 11/26/13 0908  . traZODone (DESYREL) tablet 50 mg  50 mg Oral QHS PRN,MR X 1 Tiffany G Greene, PA-C      . zolpidem (AMBIEN) tablet 5 mg  5 mg Oral QHS PRN Linus Mako, PA-C       Current Outpatient Prescriptions  Medication Sig Dispense Refill  . buPROPion (WELLBUTRIN XL) 150 MG 24 hr tablet Take 1 tablet (150 mg total) by mouth daily. For depression  30 tablet  1  . clonazePAM (KLONOPIN) 0.5 MG tablet Take 0.5 mg by mouth 2 (two) times daily as needed for anxiety (anxiety).      Marland Kitchen escitalopram (LEXAPRO) 20 MG tablet Take 30 mg by mouth daily. For depression.      Marland Kitchen estradiol (ESTRACE) 2 MG tablet Take 1 tablet (2 mg total) by mouth 3 (three) times daily.  90 tablet  0  . finasteride (PROSCAR) 5 MG tablet Take 1 tablet (5 mg total) by mouth daily.  30 tablet  0  . spironolactone (ALDACTONE) 50 MG tablet Take 1 tablet (50 mg total) by mouth daily.  30 tablet  0  . traZODone (DESYREL) 50 MG tablet Take 1 tablet (50 mg total) by mouth at bedtime as needed and may repeat dose one time  if needed for sleep.  [place]  tablet  0    Psychiatric Specialty Exam: Physical Exam  Constitutional: He is oriented to person, place, and time. He appears well-developed and well-nourished.  HENT:  Head: Normocephalic.  Eyes: Pupils are equal, round, and reactive to light.  Neck: Normal range of motion.  Respiratory: Effort normal.  Musculoskeletal: Normal range of motion.  Neurological: He is alert and oriented to person, place, and time.  Skin: Skin is warm and dry.    Review of Systems  Constitutional: Negative.   Eyes: Negative.   Respiratory: Negative.   Cardiovascular: Negative.   Gastrointestinal: Negative.   Genitourinary: Negative.   Skin: Negative.   Neurological: Negative.   Endo/Heme/Allergies: Negative.   Psychiatric/Behavioral: Positive for depression. The patient is nervous/anxious and has insomnia.     Blood pressure 96/61, pulse 79, temperature 98.4 F (36.9 C), temperature source Oral, resp. rate 16, height '5\' 6"'  (1.676 m), weight 160 lb (72.576 kg), SpO2 99.00%.Body mass index is 25.84 kg/(m^2).  General Appearance: Casual and Disheveled  Eye Contact::  Poor  Speech:  Slow  Volume:  Decreased  Mood:  Anxious and Dysphoric  Affect:  Constricted and Flat  Thought Process:  Intact  Orientation:  Full (Time, Place, and Person)  Thought Content:  Rumination  Suicidal Thoughts:  No  Homicidal Thoughts:  No  Memory:  Immediate;   Fair Recent;   Fair Remote;   Fair  Judgement:  Fair  Insight:  Fair  Psychomotor Activity:  Decreased  Concentration:  Poor  Recall:  [employer] of Knowledge:Fair  Language: Fair  Akathisia:  Negative  Handed:  Right  AIMS (if indicated):     Assets:  Communication Skills Desire for Improvement  Sleep:      Musculoskeletal: Strength & Muscle Tone: within normal limits Gait & Station: normal Patient leans: N/A  Treatment Plan Summary: Daily contact with patient to assess and evaluate symptoms and progress in  treatment Medication management Patient will be started on Abilify along with home medications to see if this will stabilize patient Terria Deschepper, [facility] 11/26/2013 12:00 PM

## 2013-11-26 NOTE — Progress Notes (Signed)
[facility] INPATIENT:  Family/Significant Other Suicide Prevention Education  Suicide Prevention Education:  Patient Refusal for Family/Significant Other Suicide Prevention Education: The patient Randy [name] has refused to provide written consent for family/significant other to be provided Family/Significant Other Suicide Prevention Education during admission and/or prior to discharge.  Physician notified.  Glenice LaineIbekwe, Ziya Coonrod B 11/26/2013, 11:13 PM

## 2013-11-26 NOTE — ED Notes (Addendum)
Pt informed RN not to touch.   Will get vitals when patient gives consent.

## 2013-11-26 NOTE — BH Assessment (Signed)
Notified Maryjean Mornharles Kober, PA-C that Pt's labs are all back and she is medically cleared. He recommends Pt be admitted to Observation Bed for medication evaluation and monitoring. According to Binnie RailJoann Glover, [facility], Observation is being run [facility] [facility] tonight. Notified Dr. Mancel BaleElliott Wentz of disposition.  Harlin RainFord Ellis Ria CommentWarrick Jr, LPC, [facility] Triage Specialist [phone]

## 2013-11-27 DIAGNOSIS — F3289 Other specified depressive episodes: Secondary | ICD-10-CM | POA: Diagnosis not present

## 2013-11-27 DIAGNOSIS — F329 Major depressive disorder, single episode, unspecified: Secondary | ICD-10-CM

## 2013-11-27 MED ORDER — SPIRONOLACTONE 50 MG PO TABS: 50.0000 mg | ORAL_TABLET | Freq: Every day | ORAL | Status: DC

## 2013-11-27 MED ORDER — ARIPIPRAZOLE 5 MG PO TABS
2.5000 mg | ORAL_TABLET | Freq: Every day | ORAL | Status: DC
  Filled 2013-11-27 (×2): qty 1

## 2013-11-27 MED ORDER — FINASTERIDE 5 MG PO TABS: 5.0000 mg | ORAL_TABLET | Freq: Every day | ORAL | Status: DC

## 2013-11-27 MED ORDER — ARIPIPRAZOLE 5 MG PO TABS
5.0000 mg | ORAL_TABLET | Freq: Every day | ORAL | Status: DC
  Administered 2013-11-27: 2.5 mg via ORAL
  Filled 2013-11-27: qty 1
  Filled 2013-11-27 (×3): qty 3

## 2013-11-27 MED ORDER — ARIPIPRAZOLE 5 MG PO TABS: 5.0000 mg | ORAL_TABLET | Freq: Every day | ORAL | Status: DC

## 2013-11-27 NOTE — Progress Notes (Signed)
Patient ID: Randy [name], male   DOB: [birth year], 43 y.o.   MRN: [id number] Seen earlier this afternoon by Dr. Lucianne MussKumar and Nanine MeansJamison Lord NP and it was determined that she can be discharged. She denies any thoughts to hurt self. She wanted to get back on Abilify that she had found helpful previously. Asher MuirJamie and Dr. Lucianne MussKumar agreed to restart her at 2.5 mg for five days than to increase to a whole 5 mg pill. She is fine with her discharge plans. She is not interested at this time for outpatient therapy, and will follow up with her current psychiatrist Dr. Jannifer FranklinAkintayo. Reviewed with her discharge plans and she verbalized her understanding. All property returned to her. She wanted to call a friend to pick her up when we give her her cell phone back and if friend cant get her she needs a bus pass. He denies any thoughts to hurt others and is not psychotic. Has slept much of the shift.All property returned.

## 2013-11-27 NOTE — Plan of Care (Signed)
[facility] Observation Crisis Plan  Reason for Crisis Plan:  Medication Management   Plan of Care:  Refer to Randy MinsMojeed Akintayo, MD, pt's current outpatient psychiatry provider.  Family Support:    Unnamed friends and family  Current Living Environment:  Living Arrangements: Alone; pt may return to household  [facility]:  [facility]   Name Acct ID Class Status Primary Coverage   Maxwell Caulench, [place] [id number] BEHAVIORAL HEALTH OBSERVATION Open MEDICARE - MEDICARE PART A AND B        Guarantor Account (for Hospital Account [id number])   Name Relation to Pt Service Area Active? Acct Type   Maxwell Caulench, [place] Self [facility] Yes [facility]   Address Phone       [address] [place], [place] [address] [phone])          Coverage Information (for Hospital Account [id number])   F/O Payor/Plan Precert #   MEDICARE/MEDICARE PART A AND B    Subscriber Subscriber #   Maxwell Caulench, Kesley [id number]   Address Phone   [address] [place], [place] [phone]       Legal Guardian:   Self  Primary Care Provider:  Neena RhymesKatherine Tabori, MD  Current Outpatient Providers:  Randy MinsMojeed Akintayo, MD  Psychiatrist:    Thedore MinsMojeed Akintayo, MD  Counselor/Therapist:   None  Compliant with Medications:  Yes  Additional Information: After consulting with Nelly RoutArchana Kumar, MD and Randy MeansJamison Lord, NP it has been determined that, provided pt is able to contract for safety, pt does not present a life threatening danger to self or others at this time, and that psychiatric hospitalization is not indicated for the pt.  Pt signed a [employer] managero-Harm Contract.  Dr Lucianne MussKumar is agreeable to adjusting pt's medications, but wants pt to have a follow up appointment with pt's current outpatient provider, Randy MinsMojeed Akintayo, MD as soon as possible.  Pt has an appointment scheduled for 12/12/2013, according to pt's report.  However, upon calling Dr Gloris ManchesterAkintayo's office a new appointment was scheduled for 12/04/2013 @ 15:45.  This  will be included in pt's discharge instructions.  Randy Canninghomas Ulysee Fyock, MA Triage Specialist Randy [name], Randy [name] 7/20/20152:41 PM

## 2013-11-27 NOTE — BH Assessment (Signed)
[facility] Assessment Progress Note  Per Nelly RoutArchana Kumar, MD and Nanine MeansJamison Lord, NP, pt will receive the requested medication adjustments.  Dr Lucianne MussKumar requests that I contact pt's current outpatient provider, Thedore MinsMojeed Akintayo, MD to request a follow-up appointment as soon as possible.  Call was placed at 13:25, and it rolled to voice mail.  Message was left.  Will try again later.  Doylene Canninghomas Barnes Florek, MA Triage Specialist 11/27/2013 @ 13:29

## 2013-11-27 NOTE — Discharge Instructions (Signed)
For your ongoing behavioral health needs, continue treatment with Thedore MinsMojeed Akintayo, MD at the [facility].  You have a new appointment scheduled for Monday, 12/04/2013 at 3:45 pm.       Thedore MinsMojeed Akintayo, MD      [facility]      [address]      [place], [place] [address]      [phone]  If you have a behavioral health crisis you have several options, all of which are available 24 hours a day, 7 days a week:  *Call the phone numbers on your No-Harm Contract: [phone] or toll free [phone] *Call Mobile Crisis and a clinician will come to you: [phone] *Call 911 *Go to the [facility] at [address], [place], [place] *Go to your local hospital emergency department

## 2013-11-27 NOTE — Progress Notes (Signed)
D: Patient arrived on unit at 8:45pm. He is a Transgendered - male to male. Patient stated " I came here because I'm pretty sure I needed Abilify that was prescribed for me before that really helped my depression and anxiety". Patient reports having depression and crying spells few days ago that won't again away. He decided to come to the hospital to seek for help before his outpatient appointment with Dr. Jannifer FranklinAkintayo. Patient reports that she has been compliant with his medication and wouldn't know why she is depressed now. Patient not fully cooperative with the admission process as he kept saying "I'm not here to answer all these questions. I am here to get help". On admission, patient looks sad and worried, appeared unkempt and a body odor. Patient is alert and oriented X4. Patient denied pain at this time. Denied AH/VH. Nutrition and fluid offered.  A: Support and encouragement offered to patient. Encouraged to verbalize need to staff. Patient contracts for safety. Every 15 minutes check for safety.  R: Patient agreed to make need known to staff. Will continue to monitor patient.
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
## 2013-11-27 NOTE — [facility] Suicide Risk Assessment (Signed)
Suicide Risk Assessment  Discharge Assessment     Demographic Factors:  Caucasian, Gay, lesbian, or bisexual orientation, Living alone and Unemployed  Total Time spent with patient: 20 minutes  Psychiatric Specialty Exam:     Blood pressure 92/59, pulse 65, temperature 98.7 F (37.1 C), temperature source Oral, resp. rate 16, height 5' 5.6" (1.666 m), weight 166 lb (75.297 kg), SpO2 98.00%.Body mass index is 27.13 kg/(m^2).  General Appearance: Disheveled  Eye Contact::  Good  Speech:  Normal Rate  Volume:  Normal  Mood:  Euthymic  Affect:  Congruent  Thought Process:  Coherent  Orientation:  Full (Time, Place, and Person)  Thought Content:  WDL  Suicidal Thoughts:  No  Homicidal Thoughts:  No  Memory:  Immediate;   Good Recent;   Good Remote;   Good  Judgement:  Good  Insight:  Good  Psychomotor Activity:  Normal  Concentration:  Good  Recall:  Good  Fund of Knowledge:Good  Language: Good  Akathisia:  No  Handed:  Right  AIMS (if indicated):     Assets:  Housing Leisure Time Physical Health Resilience  Sleep:       Musculoskeletal: Strength & Muscle Tone: within normal limits Gait & Station: normal Patient leans: N/A   Mental Status Per Nursing Assessment::   On Admission:   Anxiety, depression  Current Mental Status by Physician: NA  Loss Factors: NA  Historical Factors: NA  Risk Reduction Factors:   Positive social support, Positive therapeutic relationship and Positive coping skills or problem solving skills  Continued Clinical Symptoms:  None  Cognitive Features That Contribute To Risk:  None     Suicide Risk:  Minimal: No identifiable suicidal ideation.  Patients presenting with no risk factors but with morbid ruminations; may be classified as minimal risk based on the severity of the depressive symptoms  Discharge Diagnoses:   AXIS I:  Depressive Disorder NOS and Generalized Anxiety Disorder AXIS II:  Deferred AXIS III:   Past  Medical History  Diagnosis Date  . Depression   . Hepatitis C   . 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 Cellulitis  Of  Left Forearm 01/10/2012    From IVDA   AXIS IV:  other psychosocial or environmental problems, problems related to social environment and problems with primary support group AXIS V:  61-70 mild symptoms  Plan Of Care/Follow-up recommendations:  Activity:  as tolerated Diet:  low-sodium heart healthy diet  Is patient on multiple antipsychotic therapies at discharge:  No   Has Patient had three or more failed trials of antipsychotic monotherapy by history:  No  Recommended Plan for Multiple Antipsychotic Therapies: NA    LORD, JAMISON, PMH-NP 11/27/2013, 1:36 PM

## 2013-11-28 NOTE — H&P (Signed)
No face to face evaluation but I agree with this note and disposition after reviewing them
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
## 2013-12-03 NOTE — Discharge Summary (Signed)
Patient seen, evaluated by me. Plan formulated by me

## 2013-12-12 ENCOUNTER — Ambulatory Visit: Payer: Self-pay | Admitting: Internal Medicine

## 2013-12-18 ENCOUNTER — Encounter: Payer: Self-pay | Admitting: Internal Medicine

## 2013-12-18 ENCOUNTER — Ambulatory Visit (INDEPENDENT_AMBULATORY_CARE_PROVIDER_SITE_OTHER): Payer: Medicare Other | Admitting: Internal Medicine

## 2013-12-18 VITALS — BP 121/79 | HR 74 | Temp 98.4°F | Ht 66.0 in | Wt 178.0 lb

## 2013-12-18 DIAGNOSIS — B182 Chronic viral hepatitis C: Secondary | ICD-10-CM

## 2013-12-18 DIAGNOSIS — Z23 Encounter for immunization: Secondary | ICD-10-CM | POA: Diagnosis not present

## 2013-12-18 NOTE — Addendum Note (Signed)
Addended by: Wendall MolaOCKERHAM, JACQUELINE A on: 12/18/2013 04:47 PM   Modules accepted: Orders

## 2013-12-18 NOTE — Progress Notes (Signed)
+Randy [name] is a 43 y.o. male who presents for initial evaluation and management of a positive Hepatitis C antibody test.  Patient tested positive 2013. Test was performed as part of an evaluation of abnormal liver function test:(elevated ALT, elevated AST). Hepatitis C risk factors present are: IV drug abuse (details: started about 3 years ago, now off, except one relapse a few weeks ago). Patient denies history of blood transfusion, history of clotting factor transfusion, renal dialysis, sexual contact with person with liver disease, tattoos. Patient has had other studies performed. Results: hepatitis C RNA by PCR, result: positive. Patient has not had prior treatment for Hepatitis C. Patient does not have a past history of liver disease. Patient does not have a family history of liver disease.   HPI: Has a long history of anxiety.  Is seeing psychiatry.  Drinks alcohol occasionally.    Patient does not have documented immunity to Hepatitis A. Patient does not have documented immunity to Hepatitis B.     Review of Systems A comprehensive review of systems was negative.   Past Medical History  Diagnosis Date  . Depression   . Hepatitis C   . 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 Cellulitis  Of  Left Forearm 01/10/2012    From IVDA    History  Substance Use Topics  . Smoking status: Current Every Day Smoker -- 0.50 packs/day for 20 years    Types: Cigarettes  . Smokeless tobacco: Never Used     Comment: trying to quit-using e-cig some  . Alcohol Use: 0.0 oz/week     Comment: once every 1-2 months    Family History  Problem Relation Age of Onset  . Idiopathic pulmonary fibrosis Father    No liver disease or liver cancer   Objective:   Filed Vitals:   12/18/13 1554  BP: 121/79  Pulse: 74  Temp: 98.4 F (36.9 C)   in no apparent distress and alert HEENT: anicteric Cor RRR and No murmurs clear Bowel sounds are normal, liver is not enlarged, spleen is not  enlarged peripheral pulses normal, no pedal edema, no clubbing or cyanosis negative for - jaundice, spider hemangioma, telangiectasia, palmar erythema, ecchymosis and atrophy  Laboratory Genotype:  Lab Results  Component Value Date   HCVGENOTYPE 1a 11/13/2013   HCV viral load:  Lab Results  Component Value Date   HCVQUANT [id number]* 11/13/2013   Lab Results  Component Value Date   WBC 8.1 11/25/2013   HGB 17.0 11/25/2013   HCT 50.0 11/25/2013   MCV 92.3 11/25/2013   PLT 156 11/25/2013    Lab Results  Component Value Date   CREATININE 1.00 11/25/2013   BUN 9 11/25/2013   NA 136* 11/25/2013   K 4.0 11/25/2013   CL 102 11/25/2013   CO2 20 11/25/2013    Lab Results  Component Value Date   ALT 79* 11/25/2013   AST 43* 11/25/2013   ALKPHOS 60 11/25/2013   BILITOT 0.8 11/25/2013   INR 0.91 11/13/2013      Assessment: Hepatitis C genotype 1a  Plan: 1) Patient counseled extensively on limiting acetaminophen to no more than 2 grams daily, avoidance of alcohol. 2) Transmission discussed with patient including sexual transmission, sharing razors and toothbrush.   3) Will need referral to gastroenterology: No.unless otherwise indicated.   4) Will need referral for substance abuse counseling: Yes.  At least to confirm that she will continue to remain drug free and has tools in place to remain  so.   5) Will prescribe Harvoni for 12 weeks once work up complete 6) Hepatitis A vaccine Yes.   7) Hepatitis B vaccine Yes.   8) Pneumovax vaccine No.unless otherwise indicated

## 2013-12-19 ENCOUNTER — Ambulatory Visit: Payer: Self-pay | Admitting: Internal Medicine

## 2013-12-26 DIAGNOSIS — F331 Major depressive disorder, recurrent, moderate: Secondary | ICD-10-CM | POA: Diagnosis not present

## 2013-12-26 DIAGNOSIS — F411 Generalized anxiety disorder: Secondary | ICD-10-CM | POA: Diagnosis not present

## 2014-01-02 ENCOUNTER — Ambulatory Visit (HOSPITAL_COMMUNITY): Payer: Medicare Other

## 2014-01-11 ENCOUNTER — Ambulatory Visit (HOSPITAL_COMMUNITY): Admission: RE | Admit: 2014-01-11 | Payer: Medicare Other | Source: Ambulatory Visit

## 2014-01-11 ENCOUNTER — Ambulatory Visit (HOSPITAL_COMMUNITY): Payer: Medicare Other

## 2014-01-18 ENCOUNTER — Ambulatory Visit (HOSPITAL_COMMUNITY): Payer: Medicare Other

## 2014-01-22 ENCOUNTER — Ambulatory Visit: Payer: Self-pay | Admitting: Internal Medicine

## 2014-01-25 ENCOUNTER — Ambulatory Visit (HOSPITAL_COMMUNITY): Payer: Medicare Other

## 2014-02-22 ENCOUNTER — Ambulatory Visit (HOSPITAL_COMMUNITY): Admission: RE | Admit: 2014-02-22 | Payer: Medicare Other | Source: Ambulatory Visit

## 2014-02-22 ENCOUNTER — Encounter: Payer: Medicare Other | Admitting: Family Medicine

## 2014-02-22 DIAGNOSIS — Z0289 Encounter for other administrative examinations: Secondary | ICD-10-CM

## 2014-02-26 ENCOUNTER — Encounter: Payer: Self-pay | Admitting: Family Medicine

## 2014-02-26 ENCOUNTER — Telehealth: Payer: Self-pay | Admitting: *Deleted

## 2014-02-26 DIAGNOSIS — Z0289 Encounter for other administrative examinations: Secondary | ICD-10-CM

## 2014-02-26 NOTE — Telephone Encounter (Signed)
Pt has no-showed 2 CPE's

## 2014-02-26 NOTE — Telephone Encounter (Signed)
Pt did not show for appointment 02/26/2014 at 8am for CPE

## 2014-02-26 NOTE — Telephone Encounter (Signed)
See notes below

## 2014-03-06 ENCOUNTER — Ambulatory Visit: Payer: Self-pay | Admitting: Internal Medicine

## 2014-06-07 DIAGNOSIS — F331 Major depressive disorder, recurrent, moderate: Secondary | ICD-10-CM | POA: Diagnosis not present

## 2014-06-07 DIAGNOSIS — F411 Generalized anxiety disorder: Secondary | ICD-10-CM | POA: Diagnosis not present

## 2014-07-04 ENCOUNTER — Emergency Department (HOSPITAL_COMMUNITY)
Admission: EM | Admit: 2014-07-04 | Discharge: 2014-07-04 | Disposition: A | Discharge status: Other Acute Inpatient Hospital | Payer: Medicare Other | Source: Home / Self Care | Attending: Emergency Medicine | Admitting: Emergency Medicine

## 2014-07-04 ENCOUNTER — Encounter (HOSPITAL_COMMUNITY): Payer: Self-pay | Admitting: *Deleted

## 2014-07-04 ENCOUNTER — Encounter (HOSPITAL_COMMUNITY): Payer: Self-pay | Admitting: Emergency Medicine

## 2014-07-04 ENCOUNTER — Inpatient Hospital Stay (HOSPITAL_COMMUNITY)
Admission: EM | Admit: 2014-07-04 | Discharge: 2014-07-09 | DRG: 885 | Disposition: A | Discharge status: Home / Self Care | Payer: Medicare Other | Source: Intra-hospital | Attending: Psychiatry | Admitting: Psychiatry

## 2014-07-04 ENCOUNTER — Ambulatory Visit (HOSPITAL_COMMUNITY)
Admission: AD | Admit: 2014-07-04 | Discharge: 2014-07-04 | Disposition: A | Discharge status: Home / Self Care | Payer: Medicare Other | Source: Home / Self Care | Attending: Psychiatry | Admitting: Psychiatry

## 2014-07-04 DIAGNOSIS — Z720 Tobacco use: Secondary | ICD-10-CM | POA: Insufficient documentation

## 2014-07-04 DIAGNOSIS — Z79899 Other long term (current) drug therapy: Secondary | ICD-10-CM | POA: Insufficient documentation

## 2014-07-04 DIAGNOSIS — B182 Chronic viral hepatitis C: Secondary | ICD-10-CM | POA: Diagnosis present

## 2014-07-04 DIAGNOSIS — F419 Anxiety disorder, unspecified: Secondary | ICD-10-CM | POA: Diagnosis present

## 2014-07-04 DIAGNOSIS — F1410 Cocaine abuse, uncomplicated: Secondary | ICD-10-CM | POA: Diagnosis present

## 2014-07-04 DIAGNOSIS — Z880 Allergy status to penicillin: Secondary | ICD-10-CM | POA: Insufficient documentation

## 2014-07-04 DIAGNOSIS — F329 Major depressive disorder, single episode, unspecified: Secondary | ICD-10-CM | POA: Diagnosis not present

## 2014-07-04 DIAGNOSIS — F17210 Nicotine dependence, cigarettes, uncomplicated: Secondary | ICD-10-CM | POA: Diagnosis present

## 2014-07-04 DIAGNOSIS — R45851 Suicidal ideations: Secondary | ICD-10-CM | POA: Insufficient documentation

## 2014-07-04 DIAGNOSIS — F332 Major depressive disorder, recurrent severe without psychotic features: Principal | ICD-10-CM | POA: Diagnosis present

## 2014-07-04 DIAGNOSIS — Z872 Personal history of diseases of the skin and subcutaneous tissue: Secondary | ICD-10-CM | POA: Insufficient documentation

## 2014-07-04 DIAGNOSIS — F32A Depression, unspecified: Secondary | ICD-10-CM

## 2014-07-04 DIAGNOSIS — Z8619 Personal history of other infectious and parasitic diseases: Secondary | ICD-10-CM | POA: Insufficient documentation

## 2014-07-04 DIAGNOSIS — R4589 Other symptoms and signs involving emotional state: Secondary | ICD-10-CM

## 2014-07-04 DIAGNOSIS — R630 Anorexia: Secondary | ICD-10-CM | POA: Insufficient documentation

## 2014-07-04 DIAGNOSIS — R4689 Other symptoms and signs involving appearance and behavior: Secondary | ICD-10-CM

## 2014-07-04 LAB — CBC
HCT: 48.8 % (ref 39.0–52.0)
Hemoglobin: 16.3 g/dL (ref 13.0–17.0)
MCH: 31.4 pg (ref 26.0–34.0)
MCHC: 33.4 g/dL (ref 30.0–36.0)
MCV: 94 fL (ref 78.0–100.0)
PLATELETS: 151 10*3/uL (ref 150–400)
RBC: 5.19 MIL/uL (ref 4.22–5.81)
RDW: 13 % (ref 11.5–15.5)
WBC: 8.7 10*3/uL (ref 4.0–10.5)

## 2014-07-04 LAB — COMPREHENSIVE METABOLIC PANEL
ALBUMIN: 4.3 g/dL (ref 3.5–5.2)
ALK PHOS: 57 U/L (ref 39–117)
ALT: 112 U/L — ABNORMAL HIGH (ref 0–53)
ANION GAP: 9 (ref 5–15)
AST: 50 U/L — ABNORMAL HIGH (ref 0–37)
BILIRUBIN TOTAL: 0.8 mg/dL (ref 0.3–1.2)
BUN: 15 mg/dL (ref 6–23)
CO2: 22 mmol/L (ref 19–32)
Calcium: 9.2 mg/dL (ref 8.4–10.5)
Chloride: 106 mmol/L (ref 96–112)
Creatinine, Ser: 1.08 mg/dL (ref 0.50–1.35)
GFR calc Af Amer: >90 mL/min (ref >90)
GFR calc non Af Amer: 83 mL/min — ABNORMAL LOW (ref >90)
Glucose, Bld: 93 mg/dL (ref 70–99)
Potassium: 3.7 mmol/L (ref 3.5–5.1)
Sodium: 137 mmol/L (ref 135–145)
Total Protein: 7.5 g/dL (ref 6.0–8.3)

## 2014-07-04 LAB — RAPID URINE DRUG SCREEN, HOSP PERFORMED
AMPHETAMINES: NOT DETECTED
BARBITURATES: NOT DETECTED
Benzodiazepines: NOT DETECTED
Cocaine: POSITIVE — AB
Opiates: POSITIVE — AB
TETRAHYDROCANNABINOL: NOT DETECTED

## 2014-07-04 LAB — SALICYLATE LEVEL: Salicylate Lvl: <4 mg/dL (ref 2.8–20.0)

## 2014-07-04 LAB — ETHANOL: Alcohol, Ethyl (B): <5 mg/dL (ref 0–9)

## 2014-07-04 LAB — ACETAMINOPHEN LEVEL: Acetaminophen (Tylenol), Serum: <10 ug/mL — ABNORMAL LOW (ref 10–30)

## 2014-07-04 MED ORDER — SPIRONOLACTONE 50 MG PO TABS: 50.0000 mg | ORAL_TABLET | Freq: Every day | ORAL | Status: DC

## 2014-07-04 MED ORDER — ESCITALOPRAM OXALATE 10 MG PO TABS
30.0000 mg | ORAL_TABLET | Freq: Every day | ORAL | Status: DC
  Administered 2014-07-04: 30 mg via ORAL
  Filled 2014-07-04: qty 3

## 2014-07-04 MED ORDER — FINASTERIDE 5 MG PO TABS: 5.0000 mg | ORAL_TABLET | Freq: Every day | ORAL | Status: DC

## 2014-07-04 MED ORDER — ARIPIPRAZOLE 5 MG PO TABS
5.0000 mg | ORAL_TABLET | Freq: Every day | ORAL | Status: DC
  Administered 2014-07-04: 5 mg via ORAL
  Filled 2014-07-04 (×2): qty 1

## 2014-07-04 NOTE — ED Notes (Signed)
Pt transported to [facility] by [facility] transportation service for continuation of specialized care. Belongings given to driver after patient signed for them. He left in no acute distress. 

## 2014-07-04 NOTE — BH Assessment (Signed)
Writer received consult.  [facility] and has already been assessed and meets criteria for inpatient hospitalization.  TSS will seek inpatient hospitalization.
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
## 2014-07-04 NOTE — ED Notes (Signed)
Bed: [facility]BH40 Expected date:  Expected time:  Means of arrival:  Comments: Hall C 

## 2014-07-04 NOTE — ED Notes (Signed)
Pt in view of nursing station. Sitter at bedside. Suicide precautions maintained.

## 2014-07-04 NOTE — ED Notes (Signed)
[facility] but staff unable to take report at this time.

## 2014-07-04 NOTE — ED Notes (Signed)
Pt states he is having difficulty with depression despite medications. Pt also c/o SI w/o plan, denies HI.

## 2014-07-04 NOTE — ED Provider Notes (Signed)
CSN: [id number]     Arrival date & time 07/04/14  1424 History   First MD Initiated Contact with Patient 07/04/14 1557     Chief Complaint  Patient presents with  . Depression  . Suicidal     (Consider location/radiation/quality/duration/timing/severity/associated sxs/prior Treatment) Patient is a 44 y.o. male presenting with mental health disorder. The history is provided by the patient.  Mental Health Problem Presenting symptoms: depression and suicidal thoughts   Presenting symptoms: no hallucinations, no homicidal ideas, no paranoid behavior and no suicide attempt   Degree of incapacity (severity):  Severe Onset quality:  Gradual Timing:  Constant Progression:  Worsening Chronicity:  Recurrent Context: stressful life event   Context: not alcohol use, not drug abuse, not noncompliant and not recent medication change   Treatment compliance:  All of the time Relieved by:  Nothing Worsened by:  Family interactions Ineffective treatments:  Antidepressants Associated symptoms: anhedonia, appetite change and feelings of worthlessness   Risk factors: hx of mental illness   Risk factors: no hx of suicide attempts     Past Medical History  Diagnosis Date  . Depression   . Hepatitis C   . 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 Cellulitis  Of  Left Forearm 01/10/2012    From IVDA   Past Surgical History  Procedure Laterality Date  . I&d extremity  01/13/2012    Procedure: IRRIGATION AND DEBRIDEMENT EXTREMITY;  Surgeon: Kennieth RadArthur F Carter, MD;  Location: [facility];  Service: Orthopedics;  Laterality: Left;   Family History  Problem Relation Age of Onset  . Idiopathic pulmonary fibrosis Father    History  Substance Use Topics  . Smoking status: Current Every Day Smoker -- 0.50 packs/day for 20 years    Types: Cigarettes  . Smokeless tobacco: Never Used     Comment: trying to quit-using e-cig some  . Alcohol Use: 0.0 oz/week     Comment: used once    Review of  Systems  Constitutional: Positive for appetite change.  Psychiatric/Behavioral: Positive for suicidal ideas. Negative for homicidal ideas, hallucinations and paranoia.  All other systems reviewed and are negative.     Allergies  Penicillins  Home Medications   Prior to Admission medications   Medication Sig Start Date End Date Taking? Authorizing Provider  ARIPiprazole (ABILIFY) 5 MG tablet Take 1 tablet (5 mg total) by mouth daily. 11/27/13  Yes Nanine MeansJamison Lord, NP  escitalopram (LEXAPRO) 20 MG tablet Take 30 mg by mouth daily. For depression. 02/10/13  Yes Rodolph BongEvan S Corey, MD  finasteride (PROSCAR) 5 MG tablet Take 1 tablet (5 mg total) by mouth daily. Patient not taking: Reported on 07/04/2014 11/27/13   Nanine MeansJamison Lord, NP  spironolactone (ALDACTONE) 50 MG tablet Take 1 tablet (50 mg total) by mouth daily. Patient not taking: Reported on 07/04/2014 11/27/13   Nanine MeansJamison Lord, NP   BP 113/63 mmHg  Pulse 86  Temp(Src) 97.8 F (36.6 C) (Oral)  Resp 20  SpO2 95% Physical Exam  Constitutional: He is oriented to person, place, and time. He appears well-developed and well-nourished. No distress.  HENT:  Head: Normocephalic and atraumatic.  Mouth/Throat: Oropharynx is clear and moist.  Eyes: Conjunctivae and EOM are normal. Pupils are equal, round, and reactive to light.  Neck: Normal range of motion. Neck supple.  Cardiovascular: Normal rate, regular rhythm and intact distal pulses.   No murmur heard. Pulmonary/Chest: Effort normal and breath sounds normal. No respiratory distress. He has no wheezes. He has no rales.  Abdominal: Soft. He exhibits no distension. There is no tenderness. There is no rebound and no guarding.  Musculoskeletal: Normal range of motion. He exhibits no edema or tenderness.  Neurological: He is alert and oriented to person, place, and time.  Skin: Skin is warm and dry. No rash noted. No erythema.  Psychiatric: His behavior is normal. His affect is blunt. He is not  actively hallucinating. He expresses suicidal ideation. He expresses no suicidal plans.  Nursing note and vitals reviewed.   ED Course  Procedures (including critical care time) Labs Review Labs Reviewed  ACETAMINOPHEN LEVEL - Abnormal; Notable for the following:    Acetaminophen (Tylenol), Serum <10.0 (*)    All other components within normal limits  COMPREHENSIVE METABOLIC PANEL - Abnormal; Notable for the following:    AST 50 (*)    ALT 112 (*)    GFR calc non Af Amer 83 (*)    All other components within normal limits  CBC  ETHANOL  SALICYLATE LEVEL  URINE RAPID DRUG SCREEN (HOSP PERFORMED)    Imaging Review No results found.   EKG Interpretation None      MDM   Final diagnoses:  Depression  Suicidal behavior    Patient here with a history of depression currently on medications and sees a psychiatrist who is presenting today with worsening depression and suicidal ideation without a plan. Approximate one month ago he ended a long-term relationship which has worsened his depression. He is still taking his medications regularly but states he is just feeling worse. No prior suicidal attempts. Denies any alcohol or drug use. Medical screening labs pending. Patient is otherwise medically clear.  5:01 PM Labs wnl.  Medically clear  Gwyneth Sprout, MD 07/04/14 1701

## 2014-07-04 NOTE — BH Assessment (Signed)
[facility]   Caymen Dubray, [place] Triage Specialist 07/04/2014 8:22 PM

## 2014-07-05 ENCOUNTER — Encounter (HOSPITAL_COMMUNITY): Payer: Self-pay | Admitting: *Deleted

## 2014-07-05 DIAGNOSIS — F332 Major depressive disorder, recurrent severe without psychotic features: Secondary | ICD-10-CM | POA: Diagnosis present

## 2014-07-05 MED ORDER — MAGNESIUM HYDROXIDE 400 MG/5ML PO SUSP: 30.0000 mL | Freq: Every day | ORAL | Status: DC | PRN

## 2014-07-05 MED ORDER — ESCITALOPRAM OXALATE 20 MG PO TABS
30.0000 mg | ORAL_TABLET | Freq: Every day | ORAL | Status: DC
  Administered 2014-07-05: 30 mg via ORAL
  Filled 2014-07-05 (×3): qty 1

## 2014-07-05 MED ORDER — ESCITALOPRAM OXALATE 20 MG PO TABS
30.0000 mg | ORAL_TABLET | Freq: Every day | ORAL | Status: DC
  Administered 2014-07-06 – 2014-07-08 (×3): 30 mg via ORAL
  Filled 2014-07-05 (×4): qty 1

## 2014-07-05 MED ORDER — NICOTINE 21 MG/24HR TD PT24
MEDICATED_PATCH | TRANSDERMAL | Status: AC
Start: 2014-07-05 — End: 2014-07-05
  Filled 2014-07-05: qty 1

## 2014-07-05 MED ORDER — NICOTINE 21 MG/24HR TD PT24
21.0000 mg | MEDICATED_PATCH | Freq: Every day | TRANSDERMAL | Status: DC
  Administered 2014-07-05: 10:00:00 via TRANSDERMAL
  Administered 2014-07-06 – 2014-07-09 (×4): 21 mg via TRANSDERMAL
  Filled 2014-07-05 (×5): qty 1

## 2014-07-05 MED ORDER — ARIPIPRAZOLE 5 MG PO TABS
5.0000 mg | ORAL_TABLET | Freq: Every day | ORAL | Status: DC
  Administered 2014-07-05: 5 mg via ORAL
  Filled 2014-07-05 (×3): qty 1

## 2014-07-05 MED ORDER — ACETAMINOPHEN 325 MG PO TABS: 650.0000 mg | ORAL_TABLET | Freq: Four times a day (QID) | ORAL | Status: DC | PRN

## 2014-07-05 MED ORDER — ARIPIPRAZOLE 10 MG PO TABS
10.0000 mg | ORAL_TABLET | Freq: Every day | ORAL | Status: DC
  Administered 2014-07-06 – 2014-07-08 (×3): 10 mg via ORAL
  Filled 2014-07-05 (×4): qty 1

## 2014-07-05 MED ORDER — ESTRADIOL 2 MG PO TABS
2.0000 mg | ORAL_TABLET | Freq: Three times a day (TID) | ORAL | Status: DC
  Administered 2014-07-05 – 2014-07-09 (×13): 2 mg via ORAL
  Filled 2014-07-05 (×13): qty 1
  Filled 2014-07-05: qty 2
  Filled 2014-07-05 (×2): qty 1

## 2014-07-05 MED ORDER — ALUM & MAG HYDROXIDE-SIMETH 200-200-20 MG/5ML PO SUSP: 30.0000 mL | ORAL | Status: DC | PRN

## 2014-07-05 NOTE — Progress Notes (Signed)
Patient ID: Randy [name], male   DOB: 1970/10/19, 44 y.o.   MRN: [id number] PER STATE REGULATIONS 482.30  THIS CHART WAS REVIEWED FOR MEDICAL NECESSITY WITH RESPECT TO THE PATIENT'S ADMISSION/DURATION OF STAY.  NEXT REVIEW DATE: 07/08/14  Loura HaltBARBARA Armya Westerhoff, RN, BSN CASE MANAGER

## 2014-07-05 NOTE — Progress Notes (Signed)
Recreation Therapy Notes  Animal-Assisted Activity/Therapy (AAA/T) Program Checklist/Progress Notes Patient Eligibility Criteria Checklist & Daily Group note for Rec Tx Intervention  Date:  02.25.2016 Time: 2:45pm Location: [address] Hall Dayroom    AAA/T Program Assumption of Risk Form signed by Patient/ or Parent Legal Guardian yes  Patient is free of allergies or sever asthma yes  Patient reports no fear of animals yes  Patient reports no history of cruelty to animals yes  Patient understands his/her participation is voluntary yes  Patient washes hands before animal contact yes  Patient washes hands after animal contact yes  Behavioral Response: Appropriate   Education: Hand Washing, Appropriate Animal Interaction   Education Outcome: Acknowledges education.   Clinical Observations/Feedback: Patient appropriately engaged with therapy dog, handler and peers during session.   Randy [name], LRT/CTRS  Randy [name] L 07/05/2014 4:27 PM 

## 2014-07-05 NOTE — [facility] Group Notes (Signed)
  [facility] LCSW Group Therapy  [facility] 1:15 - 2:30 PM  07/05/2014 3:51 PM   Type of Therapy:  Group Therapy  Participation Level: Minimal  Participation Quality: Minimal  Affect: Depressed, Flat  Cognitive:  Appropriate  Insight:  Developing/Improving   Engagement in Therapy:  Developing/Improving   Modes of Intervention:  Discussion, Education, Exploration, Problem-Solving, Rapport Building, Support   Summary of Progress/Problems:   Patient made no comments on the presentation by the speaker from [facility] [place].  Wynn BankerHodnett, Randy [name] 07/05/2014 3:51 PM

## 2014-07-05 NOTE — H&P (Signed)
Psychiatric Admission Assessment Adult  Patient Identification: Randy [name] MRN:  [id number] Date of Evaluation:  07/05/2014 Chief Complaint:  MDD Principal Diagnosis: <principal problem not specified> Diagnosis:   Patient Active Problem List   Diagnosis Date Noted  . MDD (major depressive disorder), recurrent severe, without psychosis [F33.2] 07/05/2014  . Major depression, recurrent [F33.9] 04/10/2012  . Cocaine dependence [F14.20] 04/10/2012  . Polysubstance abuse including IVDA (heroin), cocaine, marijuana [F19.10] 01/10/2012  . Elevated LFTs [R79.89] 01/10/2012  . Chronic hepatitis C without hepatic coma [B18.2] 01/10/2012  . 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 Tobacco abuse [Z72.0] 01/10/2012  . Anxiety disorder [F41.9] 08/14/2011  . Cocaine abuse [F14.10] 08/14/2011  . Recurrent major depression-severe [F33.2] 08/13/2011   History of Present Illness: Per Tele Ass't Note:  Randy [name] is an 44 y.o. male that was assessed this day as a walk-in self-referred to [facility]. Pt is a Caucasian, male to male transgendered person.  Randy [name] reported that depression worsened within the past month after her partner left her for someone else. Pt reported SI with plan to overdose on medications. Patient was without Abilify due to cost/insurance coverage for a month but was compliant taking Lexapro.  Pt has had previous attempts in the past and has a hx of inpatient and outpatient treatment.   He was last here for depression in 2015.  It was also caused by his relationship with ex BF.  He is from [place] but has lived in [place] for most of his adult life.  He is a Magazine features editor.  He states that his depression has begun to affect his work.  He has been on a few psychotropic meds but favors the Abilify the best as it helps with his anxiety and depression.  He also states that the Lexapro works well for him.  He is denying SI/HI/AVH at the moment.  Elements:   Location:  Depression. Quality:  Feelings of hopelessness, worthlessness, anxiety. Severity:  severe. Timing:  since I broke up with my partner. Duration:  chronic, intermittent. Context:  "Started when I broke up with my BF.  I just need to find a way to keep going.". Associated Signs/Symptoms: Depression Symptoms:  depressed mood, fatigue, feelings of worthlessness/guilt, difficulty concentrating, (Hypo) Manic Symptoms:  Irritable Mood, Anxiety Symptoms:  Excessive Worry, Psychotic Symptoms:  NA PTSD Symptoms: NA Total Time spent with patient: 30 min  Past Medical History:  Past Medical History  Diagnosis Date  . Depression   . Hepatitis C   . 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 Cellulitis  Of  Left Forearm 01/10/2012    From IVDA    Past Surgical History  Procedure Laterality Date  . I&d extremity  01/13/2012    Procedure: IRRIGATION AND DEBRIDEMENT EXTREMITY;  Surgeon: Sharmon Revere, MD;  Location: [facility];  Service: Orthopedics;  Laterality: Left;   Family History:  Family History  Problem Relation Age of Onset  . Idiopathic pulmonary fibrosis Father    Social History:  History  Alcohol Use  . 0.0 oz/week    Comment: used once     History  Drug Use  . Yes  . Special: Heroin, "Crack" cocaine    Comment: heroin last used 06/30/14    History   Social History  . Marital Status: Single    Spouse Name: N/A  . Number of Children: N/A  . Years of Education: N/A   Occupational History  . 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 Smoking status: Current Every Day Smoker -- 0.50 packs/day for 20 years    Types: Cigarettes  . Smokeless tobacco: Never Used     Comment: trying to quit-using e-cig some  . Alcohol Use: 0.0 oz/week     Comment: used once  . Drug Use: Yes    Special: Heroin, "Crack" cocaine     Comment: heroin last used 06/30/14  . Sexual Activity:    Partners: Male    Birth Control/ Protection:  Condom     Comment: Has been HIV tested in past and has been negative.   Other Topics Concern  . None   Social History Narrative   Transgendered male.  Single.  Lives alone.  Independent.   Additional Social History:  Musculoskeletal: Strength & Muscle Tone: within normal limits Gait & Station: normal Patient leans: N/A  Psychiatric Specialty Exam: Physical Exam  Vitals reviewed. Psychiatric: He exhibits a depressed mood.    Review of Systems  Constitutional: Negative.   HENT: Negative.   Eyes: Negative.   Respiratory: Negative.   Cardiovascular: Negative.   Gastrointestinal: Negative.   Genitourinary: Negative.   Musculoskeletal: Negative.   Skin: Negative.   Neurological: Negative.   Endo/Heme/Allergies: Negative.   Psychiatric/Behavioral: Positive for depression. The patient is nervous/anxious.     Blood pressure 90/58, pulse 94, temperature 98.3 F (36.8 C), temperature source Oral, resp. rate 16, height _0  (1.651 m), weight 0 kg (0 lb).Body mass index is 0.00 kg/(m^2).  General Appearance: Disheveled  Eye Sport and exercise psychologist::  Fair  Speech:  Normal Rate  Volume:  Normal  Mood:  Depressed  Affect:  Depressed  Thought Process:  Circumstantial  Orientation:  Full (Time, Place, and Person)  Thought Content:  Rumination  Suicidal Thoughts:  No  Homicidal Thoughts:  No  Memory:  Immediate;   Fair Recent;   Fair Remote;   Fair  Judgement:  Intact  Insight:  Fair  Psychomotor Activity:  Normal  Concentration:  Negative  Recall:  [employer] of Knowledge:Fair  Language: Fair  Akathisia:  Negative  Handed:  Right  AIMS (if indicated):     Assets:  Desire for Improvement Resilience Social Support  ADL's:  Intact  Cognition: WNL  Sleep:      Risk to Self: Is patient at risk for suicide?: No Risk to Others:   Prior Inpatient Therapy:   Prior Outpatient Therapy:    Alcohol Screening: Patient refused Alcohol Screening Tool: Yes 1. How often do you have a drink  containing alcohol?: Never (I" barely ever drink") 9. Have you or someone else been injured as a result of your drinking?: No 10. Has a relative or friend or a doctor or another health worker been concerned about your drinking or suggested you cut down?: No Alcohol Use Disorder Identification Test Final Score (AUDIT): 0  Allergies:   Allergies  Allergen Reactions  . Penicillins Other (See Comments)    Swollen joints    Lab Results:  Results for orders placed or performed during the hospital encounter of 07/04/14 (from the past 48 hour(s))  Acetaminophen level     Status: Abnormal   Collection Time: 07/04/14  2:33 PM  Result Value Ref Range   Acetaminophen (Tylenol), Serum <10.0 (L) 10 - 30 ug/mL    Comment:        THERAPEUTIC CONCENTRATIONS VARY SIGNIFICANTLY. A RANGE OF 10-30 ug/mL MAY BE AN EFFECTIVE CONCENTRATION FOR MANY PATIENTS.  HOWEVER, SOME ARE BEST TREATED AT CONCENTRATIONS OUTSIDE THIS RANGE. ACETAMINOPHEN CONCENTRATIONS >150 ug/mL AT 4 HOURS AFTER INGESTION AND >50 ug/mL AT 12 HOURS AFTER INGESTION ARE OFTEN ASSOCIATED WITH TOXIC REACTIONS.   CBC     Status: None   Collection Time: 07/04/14  2:33 PM  Result Value Ref Range   WBC 8.7 4.0 - 10.5 K/uL   RBC 5.19 4.22 - 5.81 MIL/uL   Hemoglobin 16.3 13.0 - 17.0 g/dL   HCT 48.8 39.0 - 52.0 %   MCV 94.0 78.0 - 100.0 fL   MCH 31.4 26.0 - 34.0 pg   MCHC 33.4 30.0 - 36.0 g/dL   RDW 13.0 11.5 - 15.5 %   Platelets 151 150 - 400 K/uL  Comprehensive metabolic panel     Status: Abnormal   Collection Time: 07/04/14  2:33 PM  Result Value Ref Range   Sodium 137 135 - 145 mmol/L   Potassium 3.7 3.5 - 5.1 mmol/L   Chloride 106 96 - 112 mmol/L   CO2 22 19 - 32 mmol/L   Glucose, Bld 93 70 - 99 mg/dL   BUN 15 6 - 23 mg/dL   Creatinine, Ser 1.08 0.50 - 1.35 mg/dL   Calcium 9.2 8.4 - 10.5 mg/dL   Total Protein 7.5 6.0 - 8.3 g/dL   Albumin 4.3 3.5 - 5.2 g/dL   AST 50 (H) 0 - 37 U/L   ALT 112 (H) 0 - 53 U/L   Alkaline  Phosphatase 57 39 - 117 U/L   Total Bilirubin 0.8 0.3 - 1.2 mg/dL   GFR calc non Af Amer 83 (L) >90 mL/min   GFR calc Af Amer >90 >90 mL/min    Comment: (NOTE) The eGFR has been calculated using the CKD EPI equation. This calculation has not been validated in all clinical situations. eGFR's persistently <90 mL/min signify possible Chronic Kidney Disease.    Anion gap 9 5 - 15  Ethanol (ETOH)     Status: None   Collection Time: 07/04/14  2:33 PM  Result Value Ref Range   Alcohol, Ethyl (B) <5 0 - 9 mg/dL    Comment:        LOWEST DETECTABLE LIMIT FOR SERUM ALCOHOL IS 11 mg/dL FOR MEDICAL PURPOSES ONLY   Salicylate level     Status: None   Collection Time: 07/04/14  2:33 PM  Result Value Ref Range   Salicylate Lvl <5.6 2.8 - 20.0 mg/dL  Urine Drug Screen     Status: Abnormal   Collection Time: 07/04/14  5:04 PM  Result Value Ref Range   Opiates POSITIVE (A) NONE DETECTED   Cocaine POSITIVE (A) NONE DETECTED   Benzodiazepines NONE DETECTED NONE DETECTED   Amphetamines NONE DETECTED NONE DETECTED   Tetrahydrocannabinol NONE DETECTED NONE DETECTED   Barbiturates NONE DETECTED NONE DETECTED    Comment:        DRUG SCREEN FOR MEDICAL PURPOSES ONLY.  IF CONFIRMATION IS NEEDED FOR ANY PURPOSE, NOTIFY LAB WITHIN 5 DAYS.        LOWEST DETECTABLE LIMITS FOR URINE DRUG SCREEN Drug Class       Cutoff (ng/mL) Amphetamine      1000 Barbiturate      200 Benzodiazepine   433 Tricyclics       295 Opiates          300 Cocaine          300 THC  50    Current Medications: Current Facility-Administered Medications  Medication Dose Route Frequency Provider Last Rate Last Dose  . acetaminophen (TYLENOL) tablet 650 mg  650 mg Oral Q6H PRN Delfin Gant, NP      . alum & mag hydroxide-simeth (MAALOX/MYLANTA) 200-200-20 MG/5ML suspension 30 mL  30 mL Oral Q4H PRN Delfin Gant, NP      . ARIPiprazole (ABILIFY) tablet 5 mg  5 mg Oral Daily Delfin Gant, NP   5  mg at 07/05/14 0854  . escitalopram (LEXAPRO) tablet 30 mg  30 mg Oral Daily Delfin Gant, NP   30 mg at 07/05/14 0854  . magnesium hydroxide (MILK OF MAGNESIA) suspension 30 mL  30 mL Oral Daily PRN Delfin Gant, NP      . nicotine (NICODERM CQ - dosed in mg/24 hours) patch 21 mg  21 mg Transdermal Daily Jenne Campus, MD   21 mg at 07/05/14 1010   PTA Medications: Prescriptions prior to admission  Medication Sig Dispense Refill Last Dose  . ARIPiprazole (ABILIFY) 5 MG tablet Take 1 tablet (5 mg total) by mouth daily. 30 tablet 0 07/05/2014 at Unknown time  . escitalopram (LEXAPRO) 20 MG tablet Take 30 mg by mouth daily. For depression.   07/05/2014 at Unknown time  . finasteride (PROSCAR) 5 MG tablet Take 1 tablet (5 mg total) by mouth daily. 30 tablet 0 07/05/2014 at Unknown time  . spironolactone (ALDACTONE) 50 MG tablet Take 1 tablet (50 mg total) by mouth daily. 30 tablet 0 07/05/2014 at Unknown time    Previous Psychotropic Medications: Yes   Substance Abuse History in the last 12 months:  No.  Consequences of Substance Abuse: re admissions hospital and work employment disturbance  Results for orders placed or performed during the hospital encounter of 07/04/14 (from the past 72 hour(s))  Acetaminophen level     Status: Abnormal   Collection Time: 07/04/14  2:33 PM  Result Value Ref Range   Acetaminophen (Tylenol), Serum <10.0 (L) 10 - 30 ug/mL    Comment:        THERAPEUTIC CONCENTRATIONS VARY SIGNIFICANTLY. A RANGE OF 10-30 ug/mL MAY BE AN EFFECTIVE CONCENTRATION FOR MANY PATIENTS. HOWEVER, SOME ARE BEST TREATED AT CONCENTRATIONS OUTSIDE THIS RANGE. ACETAMINOPHEN CONCENTRATIONS >150 ug/mL AT 4 HOURS AFTER INGESTION AND >50 ug/mL AT 12 HOURS AFTER INGESTION ARE OFTEN ASSOCIATED WITH TOXIC REACTIONS.   CBC     Status: None   Collection Time: 07/04/14  2:33 PM  Result Value Ref Range   WBC 8.7 4.0 - 10.5 K/uL   RBC 5.19 4.22 - 5.81 MIL/uL   Hemoglobin  16.3 13.0 - 17.0 g/dL   HCT 48.8 39.0 - 52.0 %   MCV 94.0 78.0 - 100.0 fL   MCH 31.4 26.0 - 34.0 pg   MCHC 33.4 30.0 - 36.0 g/dL   RDW 13.0 11.5 - 15.5 %   Platelets 151 150 - 400 K/uL  Comprehensive metabolic panel     Status: Abnormal   Collection Time: 07/04/14  2:33 PM  Result Value Ref Range   Sodium 137 135 - 145 mmol/L   Potassium 3.7 3.5 - 5.1 mmol/L   Chloride 106 96 - 112 mmol/L   CO2 22 19 - 32 mmol/L   Glucose, Bld 93 70 - 99 mg/dL   BUN 15 6 - 23 mg/dL   Creatinine, Ser 1.08 0.50 - 1.35 mg/dL   Calcium 9.2 8.4 - 10.5 mg/dL  Total Protein 7.5 6.0 - 8.3 g/dL   Albumin 4.3 3.5 - 5.2 g/dL   AST 50 (H) 0 - 37 U/L   ALT 112 (H) 0 - 53 U/L   Alkaline Phosphatase 57 39 - 117 U/L   Total Bilirubin 0.8 0.3 - 1.2 mg/dL   GFR calc non Af Amer 83 (L) >90 mL/min   GFR calc Af Amer >90 >90 mL/min    Comment: (NOTE) The eGFR has been calculated using the CKD EPI equation. This calculation has not been validated in all clinical situations. eGFR's persistently <90 mL/min signify possible Chronic Kidney Disease.    Anion gap 9 5 - 15  Ethanol (ETOH)     Status: None   Collection Time: 07/04/14  2:33 PM  Result Value Ref Range   Alcohol, Ethyl (B) <5 0 - 9 mg/dL    Comment:        LOWEST DETECTABLE LIMIT FOR SERUM ALCOHOL IS 11 mg/dL FOR MEDICAL PURPOSES ONLY   Salicylate level     Status: None   Collection Time: 07/04/14  2:33 PM  Result Value Ref Range   Salicylate Lvl <9.6 2.8 - 20.0 mg/dL  Urine Drug Screen     Status: Abnormal   Collection Time: 07/04/14  5:04 PM  Result Value Ref Range   Opiates POSITIVE (A) NONE DETECTED   Cocaine POSITIVE (A) NONE DETECTED   Benzodiazepines NONE DETECTED NONE DETECTED   Amphetamines NONE DETECTED NONE DETECTED   Tetrahydrocannabinol NONE DETECTED NONE DETECTED   Barbiturates NONE DETECTED NONE DETECTED    Comment:        DRUG SCREEN FOR MEDICAL PURPOSES ONLY.  IF CONFIRMATION IS NEEDED FOR ANY PURPOSE, NOTIFY LAB WITHIN 5  DAYS.        LOWEST DETECTABLE LIMITS FOR URINE DRUG SCREEN Drug Class       Cutoff (ng/mL) Amphetamine      1000 Barbiturate      200 Benzodiazepine   283 Tricyclics       662 Opiates          300 Cocaine          300 THC              50     Observation Level/Precautions:  15 minute checks  Laboratory:  per ED  Psychotherapy:  Group therapy  Medications:  As per medlist  Consultations:  As needed  Discharge Concerns:  Safety  Estimated LOS:  5-7 days  Other:     Psychological Evaluations: Yes   Treatment Plan Summary: Daily contact with patient to assess and evaluate symptoms and progress in treatment and Medication management  Medical Decision Making:  Discuss test with performing physician (1), Review and summation of old records (2), Established Problem, Worsening (2), Review of Medication Regimen & Side Effects (2) and Review of New Medication or Change in Dosage (2)  I certify that inpatient services furnished can reasonably be expected to improve the patient's condition.   Kerrie Buffalo MAY, AGNP-BC 2/25/201612:36 PM I personally assessed the patient and formulated the plan Geralyn Flash A. Sabra Heck, M.D.

## 2014-07-05 NOTE — Progress Notes (Signed)
This is a 44 y.o. male Caucasian, male to male transgendered person.He was admitted due to increased depression since the beginning of the month after her partner left her for someone else. Pt endorsed  SI with plan to overdose on medications.She continued to endorsed passive SI during this admission assessment. Her mood and affects flat, sad, depressed and irritable. Writer offered patient meal and encouraged her. Q 15 minute check initiated.

## 2014-07-05 NOTE — Tx Team (Signed)
Initial Interdisciplinary Treatment Plan   PATIENT STRESSORS: Financial difficulties Health problems Loss of relationship   PATIENT STRENGTHS: Capable of independent living Motivation for treatment/growth   PROBLEM LIST: Problem List/Patient Goals Date to be addressed Date deferred Reason deferred Estimated date of resolution  Depression 07/05/14                                                      DISCHARGE CRITERIA:  Improved stabilization in mood, thinking, and/or behavior Motivation to continue treatment in a less acute level of care Verbal commitment to aftercare and medication compliance  PRELIMINARY DISCHARGE PLAN: Attend PHP/IOP Outpatient therapy Return to previous living arrangement Return to previous work or school arrangements  PATIENT/FAMIILY INVOLVEMENT: This treatment plan has been presented to and reviewed with the patient, Randy [name], and/or family member.  The patient and family have been given the opportunity to ask questions and make suggestions.  Roselie SkinnerOgunjobi, Nik[facility] 07/05/2014, 1:48 AM

## 2014-07-05 NOTE — Progress Notes (Signed)
Adult Psychoeducational Group Note  Date:  07/05/2014 Time:  2:15 PM  Group Topic/Focus:  Overcoming Stress:   The focus of this group is to define stress and help patients assess their triggers.  Participation Level:  Active  Participation Quality:  Appropriate  Affect:  Appropriate  Cognitive:  Alert and Appropriate  Insight: Appropriate  Engagement in Group:  Engaged  Modes of Intervention: Discussion  Additional Comments:  Pt attended the group and remained appropriate and engaged throughout the duration of the group. Pt did not wish to share during the course of the group.  Fara Oldeneese, Shriley Joffe O 07/05/2014, 2:15 PM
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
## 2014-07-05 NOTE — Progress Notes (Signed)
D:Pt has a flat/sad affect and reports passive si thoughts. Pt was in her bed earlier this morning and has started interacting more later in the morning and attending group.  A:Offered support, encouragement and 15 minute checks. R:Pt contracts with staff for safety. Safety maintained on the unit.

## 2014-07-05 NOTE — [facility] Suicide Risk Assessment (Signed)
[facility] Admission Suicide Risk Assessment   Nursing information obtained from:  Patient Demographic factors:  Male, Caucasian Current Mental Status:  Self-harm thoughts Loss Factors:  Decline in physical health Historical Factors:  Prior suicide attempts Risk Reduction Factors:  Employed Total Time spent with patient: 45 minutes Principal Problem: <principal problem not specified> Diagnosis:   Patient Active Problem List   Diagnosis Date Noted  . Major depression, recurrent [F33.9] 04/10/2012    Priority: High  . Cocaine dependence [F14.20] 04/10/2012    Priority: High  . Anxiety disorder [F41.9] 08/14/2011    Priority: High  . Cocaine abuse [F14.10] 08/14/2011    Priority: High  . MDD (major depressive disorder), recurrent severe, without psychosis [F33.2] 07/05/2014  . Polysubstance abuse including IVDA (heroin), cocaine, marijuana [F19.10] 01/10/2012  . Elevated LFTs [R79.89] 01/10/2012  . Chronic hepatitis C without hepatic coma [B18.2] 01/10/2012  . 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 Tobacco abuse [Z72.0] 01/10/2012  . Recurrent major depression-severe [F33.2] 08/13/2011     Continued Clinical Symptoms:  Alcohol Use Disorder Identification Test Final Score (AUDIT): 0 The "Alcohol Use Disorders Identification Test", Guidelines for Use in Primary Care, Second Edition.  World Science writer [facility]). Score between 0-7:  no or low risk or alcohol related problems. Score between 8-15:  moderate risk of alcohol related problems. Score between 16-19:  high risk of alcohol related problems. Score 20 or above:  warrants further diagnostic evaluation for alcohol dependence and treatment.   CLINICAL FACTORS:   Severe Anxiety and/or Agitation Panic Attacks Depression:   Comorbid alcohol abuse/dependence Alcohol/Substance Abuse/Dependencies   Musculoskeletal: Strength & Muscle Tone: within normal limits Gait & Station: normal Patient leans:  N/A  Psychiatric Specialty Exam: Physical Exam  Review of Systems  Constitutional: Positive for malaise/fatigue.  HENT: Negative.   Eyes: Positive for blurred vision.  Respiratory: Positive for cough and shortness of breath.        Pack a day  Cardiovascular: Negative.   Gastrointestinal: Negative.   Genitourinary: Negative.   Musculoskeletal: Negative.   Skin: Negative.   Neurological: Positive for weakness.  Endo/Heme/Allergies: Negative.   Psychiatric/Behavioral: Positive for depression, suicidal ideas and substance abuse. The patient is nervous/anxious.     Blood pressure 90/58, pulse 94, temperature 98.3 F (36.8 C), temperature source Oral, resp. rate 16, height  (1.651 m), weight 0 kg (0 lb).Body mass index is 0.00 kg/(m^2).  General Appearance: Fairly Groomed  Patent attorney::  Fair  Speech:  Clear and Coherent  Volume:  Decreased  Mood:  Anxious and Depressed  Affect:  Restricted  Thought Process:  Coherent and Goal Directed  Orientation:  Full (Time, Place, and Person)  Thought Content:  symptoms events worries concerns  Suicidal Thoughts:  Yes.  without intent/plan  Homicidal Thoughts:  No  Memory:  Immediate;   Fair Recent;   Fair Remote;   Fair  Judgement:  Fair  Insight:  Present  Psychomotor Activity:  Restlessness  Concentration:  Fair  Recall:  [employer] of Knowledge:Fair  Language: Fair  Akathisia:  No  Handed:  Right  AIMS (if indicated):     Assets:  Desire for Improvement  Sleep:     Cognition: WNL  ADL's:  Intact     COGNITIVE FEATURES THAT CONTRIBUTE TO RISK:  Closed-mindedness, Polarized thinking and Thought constriction (tunnel vision)    SUICIDE RISK:   Moderate:  Frequent suicidal ideation with limited intensity, and duration, some specificity in terms of  plans, no associated intent, good self-control, limited dysphoria/symptomatology, some risk factors present, and identifiable protective factors, including available and  accessible social support.  PLAN OF CARE: Supportive approach/coping skills/relapse prevention                               Cocaine Abuse: address the mood instability                                Major depression: resume the Lexapro, increase the Abilify to 10 mg daily  Randy [name] is a 44 Y/O transgender male to male patient admitted acutely suicidal after her BF broke up with her. States he just showed up with a new GF. States the depression has gotten worst, she was missing work and had relapsed on using cocaine and some opioids. Was off her medications. Came for help  Medical Decision Making:  Review of Psycho-Social Stressors (1), Review of Medication Regimen & Side Effects (2) and Review of New Medication or Change in Dosage (2)  I certify that inpatient services furnished can reasonably be expected to improve the patient's condition.   Kaelie Henigan A 07/05/2014, 1:36 PM

## 2014-07-05 NOTE — Progress Notes (Signed)
Participated in group

## 2014-07-06 NOTE — Plan of Care (Signed)
Problem: Alteration in mood Goal: LTG-Pt's behavior demonstrates decreased signs of depression (Patient's behavior demonstrates decreased signs of depression to the point the patient is safe to return home and continue treatment in an outpatient setting)  Outcome: Progressing Pt attended groups on the unit today. Pt seen smiling on occasion.

## 2014-07-06 NOTE — Progress Notes (Signed)
D: Client spent most of the evening in his room lying down. Client reports depression at "8" of 10, denies any goal for today. A: Writer introduced self to client, encouraged karaoke. Medications administered as prescribed. Staff will monitor q8115min for safety. R: Client is safe on the unit, attended karaoke.

## 2014-07-06 NOTE — [facility] Group Notes (Signed)
[facility] Group Notes:  (Nursing/MHT/Case Management/Adjunct)  Date:  07/06/2014  Time:  1000  Type of Therapy:  Psychoeducational Skills  Participation Level:  Did Not Attend  Participation Quality:    Affect:    Cognitive:    Insight:    Engagement in Group:    Modes of Intervention:    Summary of Progress/Problems:  Layla BarterWhite, Lenka Zhao L 07/06/2014, 12:24 PM

## 2014-07-06 NOTE — [facility] Group Notes (Signed)
[facility] LCSW Group Therapy  Feelings Around Relapse 1:15 -2:30        07/06/2014 3:37 PM   Type of Therapy:  Group Therapy  Participation Level:  Appropriate  Participation Quality:  Appropriate  Affect:  Appropriate  Cognitive:  Attentive Appropriate  Insight:  Developing/Improving  Engagement in Therapy: Developing/Improving  Modes of Intervention:  Discussion Exploration Problem-Solving Supportive  Summary of Progress/Problems:  The topic for today was feelings around relapse.  Patient processed feelings toward relapse and was able to relate to peers. She shared relapsing would be using alcohol/drugs.  Patient identified coping skills that can be used to prevent a relapse.   Wynn BankerHodnett, Lyna Laningham Hairston 07/06/2014  3:36 PM

## 2014-07-06 NOTE — [facility] Suicide Risk Assessment (Signed)
[facility] INPATIENT:  Family/Significant Other Suicide Prevention Education  Suicide Prevention Education:  Patient Refusal for Family/Significant Other Suicide Prevention Education: The patient Randy [name] has refused to provide written consent for family/significant other to be provided Family/Significant Other Suicide Prevention Education during admission and/or prior to discharge.  Physician notified.  Wynn BankerHodnett, Lawson Mahone Hairston 07/06/2014, 3:35 PM

## 2014-07-06 NOTE — Tx Team (Signed)
Interdisciplinary Treatment Plan Update   Date Reviewed:  07/06/2014  Time Reviewed:  3:32 PM  Progress in Treatment:   Attending groups: Yes, patient is attending group. Participating in groups: Yes, patient participates in group discussion Taking medication as prescribed: Yes  Tolerating medication: Yes Family/Significant other contact made:  No,patient declined collateral contact Patient understands diagnosis: Yes, patient understands diagnosis and need for treatment. Discussing patient identified problems/goals with staff: Yes, patient is able to express goals for treatment and discharge. Medical problems stabilized or resolved: Yes Denies suicidal/homicidal ideation: Yes Patient has not harmed self or others: Yes  For review of initial/current patient goals, please see plan of care.  Estimated Length of Stay:  3-4 days  Reasons for Continued Hospitalization:  Anxiety Depression Medication stabilization   New Problems/Goals identified:    Discharge Plan or Barriers:   Home with outpatient follow up Dr. Jannifer FranklinAkintayo  Additional Comments:   Continue medication stabilization  Patient and CSW reviewed patient's identified goals and treatment plan.  Patient verbalized understanding and agreed to treatment plan.   Attendees:  Patient:  07/06/2014 3:32 PM   Signature:  Sallyanne HaversF. Cobos, MD 07/06/2014 3:32 PM  Signature: Geoffery LyonsIrving Lugo, MD 07/06/2014 3:32 PM  Signature:  Rodman KeyJanet Webb, RN 07/06/2014 3:32 PM  Signature: Scot JunJennifer Pritchard, RN 07/06/2014 3:32 PM  Signature:   07/06/2014 3:32 PM  Signature:  Juline PatchQuylle Vuk Skillern, LCSW 07/06/2014 3:32 PM  Signature:  Samuella BruinKristin Drinkard, LCSW-A 07/06/2014 3:32 PM  Signature:  Leisa LenzValerie Enoch, Care Coordinator [facility] 07/06/2014 3:32 PM  Signature:  Earl ManySara Twyman, RN 07/06/2014 3:32 PM  Signature:  07/06/2014  3:32 PM  Signature:    07/06/2014  3:32 PM  Signature:   07/06/2014  3:32 PM    Scribe for Treatment Team:   Juline PatchQuylle Kendarius Vigen,  07/06/2014 3:32 PM

## 2014-07-06 NOTE — [facility] Group Notes (Signed)
[facility]   07/06/2014 3:31 PM    Participation Quality:  Appropraite  Mood/Affect:  Appropriate  Depression Rating:  5  Anxiety Rating:  5  Thoughts of Suicide:  No  Will you contract for safety?   NA  Current AVH:  No  Plan for Discharge/Comments:  Patient attended discharge planning group and actively participated in group. She advised of having outpatient services with Dr. Jannifer FranklinAkintayo. Suicide prevention education reviewed and SPE document provided.   Transportation Means: Patient has transportation.   Supports:  Patient has a support system.   Che Below, Joesph JulyQuylle Hairston

## 2014-07-06 NOTE — Progress Notes (Signed)
[facility]  07/06/2014 3:32 PM Randy [name]  MRN:  [id number] Subjective:   Randy [name] states that every time she goes off the Abilify the depression gets worst. This time around was the brake up but feels that if she would have been on the Abilify probably it would not have gotten to be as bad. She minimizes the contribution of her cocaine use but admits it could be a factor Principal Problem: MDD (major depressive disorder), recurrent severe, without psychosis Diagnosis:   Patient Active Problem List   Diagnosis Date Noted  . Major depression, recurrent [F33.9] 04/10/2012    Priority: High  . Cocaine dependence [F14.20] 04/10/2012    Priority: High  . Anxiety disorder [F41.9] 08/14/2011    Priority: High  . Cocaine abuse [F14.10] 08/14/2011    Priority: High  . MDD (major depressive disorder), recurrent severe, without psychosis [F33.2] 07/05/2014  . Polysubstance abuse including IVDA (heroin), cocaine, marijuana [F19.10] 01/10/2012  . Elevated LFTs [R79.89] 01/10/2012  . Chronic hepatitis C without hepatic coma [B18.2] 01/10/2012  . 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 Tobacco abuse [Z72.0] 01/10/2012  . Recurrent major depression-severe [F33.2] 08/13/2011   Total Time spent with patient: 30 minutes   Past Medical History:  Past Medical History  Diagnosis Date  . Depression   . Hepatitis C   . 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 Cellulitis  Of  Left Forearm 01/10/2012    From IVDA    Past Surgical History  Procedure Laterality Date  . I&d extremity  01/13/2012    Procedure: IRRIGATION AND DEBRIDEMENT EXTREMITY;  Surgeon: Kennieth RadArthur F Carter, MD;  Location: [facility];  Service: Orthopedics;  Laterality: Left;   Family History:  Family History  Problem Relation Age of Onset  . Idiopathic pulmonary fibrosis Father    Social History:  History  Alcohol Use  . 0.0 oz/week    Comment: used once     History  Drug Use  . Yes   . Special: Heroin, "Crack" cocaine    Comment: heroin last used 06/30/14    History   Social History  . Marital Status: Single    Spouse Name: N/A  . Number of Children: N/A  . Years of Education: N/A   Occupational History  . 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 Smoking status: Current Every Day Smoker -- 0.50 packs/day for 20 years    Types: Cigarettes  . Smokeless tobacco: Never Used     Comment: trying to quit-using e-cig some  . Alcohol Use: 0.0 oz/week     Comment: used once  . Drug Use: Yes    Special: Heroin, "Crack" cocaine     Comment: heroin last used 06/30/14  . Sexual Activity:    Partners: Male    Birth Control/ Protection: Condom     Comment: Has been HIV tested in past and has been negative.   Other Topics Concern  . None   Social History Narrative   Transgendered male.  Single.  Lives alone.  Independent.   Additional History:    Sleep: Fair  Appetite:  Fair   Assessment:   Musculoskeletal: Strength & Muscle Tone: within normal limits Gait & Station: normal Patient leans: N/A   Psychiatric Specialty Exam: Physical Exam  Review of Systems  Constitutional: Positive for malaise/fatigue.  HENT: Negative.   Eyes: Negative.   Respiratory: Negative.   Cardiovascular: Negative.  Gastrointestinal: Negative.   Genitourinary: Negative.   Musculoskeletal: Negative.   Skin: Negative.   Neurological: Positive for weakness.  Endo/Heme/Allergies: Negative.   Psychiatric/Behavioral: Positive for depression and substance abuse. The patient is nervous/anxious.     Blood pressure 105/70, pulse 60, temperature 97.6 F (36.4 C), temperature source Oral, resp. rate 16, height  (1.651 m), weight 0 kg (0 lb).Body mass index is 0.00 kg/(m^2).  General Appearance: Disheveled  Eye Solicitor::  Fair  Speech:  Clear and Coherent  Volume:  Decreased  Mood:  Anxious and Depressed  Affect:  anxious worried depressed   Thought Process:  Coherent and Goal Directed  Orientation:  Full (Time, Place, and Person)  Thought Content:  symptoms events worries concerns  Suicidal Thoughts:  No  Homicidal Thoughts:  No  Memory:  Immediate;   Fair Recent;   Fair Remote;   Fair  Judgement:  Fair  Insight:  Present  Psychomotor Activity:  Restlessness  Concentration:  Fair  Recall:  [employer] of Knowledge:Fair  Language: Fair  Akathisia:  No  Handed:  Right  AIMS (if indicated):     Assets:  Desire for Improvement  ADL's:  Intact  Cognition: WNL  Sleep:        Current Medications: Current Facility-Administered Medications  Medication Dose Route Frequency Provider Last Rate Last Dose  . acetaminophen (TYLENOL) tablet 650 mg  650 mg Oral Q6H PRN Earney Navy, NP      . alum & mag hydroxide-simeth (MAALOX/MYLANTA) 200-200-20 MG/5ML suspension 30 mL  30 mL Oral Q4H PRN Earney Navy, NP      . ARIPiprazole (ABILIFY) tablet 10 mg  10 mg Oral Daily [name] Fee, MD      . escitalopram (LEXAPRO) tablet 30 mg  30 mg Oral Daily [name] Fee, MD      . estradiol (ESTRACE) tablet 2 mg  2 mg Oral TID [name] Fee, MD   2 mg at 07/06/14 1156  . magnesium hydroxide (MILK OF MAGNESIA) suspension 30 mL  30 mL Oral Daily PRN Earney Navy, NP      . nicotine (NICODERM CQ - dosed in mg/24 hours) patch 21 mg  21 mg Transdermal Daily Craige Cotta, MD   21 mg at 07/06/14 4540    Lab Results:  Results for orders placed or performed during the hospital encounter of 07/04/14 (from the past 48 hour(s))  Urine Drug Screen     Status: Abnormal   Collection Time: 07/04/14  5:04 PM  Result Value Ref Range   Opiates POSITIVE (A) NONE DETECTED   Cocaine POSITIVE (A) NONE DETECTED   Benzodiazepines NONE DETECTED NONE DETECTED   Amphetamines NONE DETECTED NONE DETECTED   Tetrahydrocannabinol NONE DETECTED NONE DETECTED   Barbiturates NONE DETECTED NONE DETECTED    Comment:        DRUG SCREEN FOR MEDICAL  PURPOSES ONLY.  IF CONFIRMATION IS NEEDED FOR ANY PURPOSE, NOTIFY LAB WITHIN 5 DAYS.        LOWEST DETECTABLE LIMITS FOR URINE DRUG SCREEN Drug Class       Cutoff (ng/mL) Amphetamine      1000 Barbiturate      200 Benzodiazepine   200 Tricyclics       300 Opiates          300 Cocaine          300 THC  50     Physical Findings: AIMS: Facial and Oral Movements Muscles of Facial Expression: None, normal Lips and Perioral Area: None, normal Jaw: None, normal Tongue: None, normal,Extremity Movements Upper (arms, wrists, hands, fingers): None, normal Lower (legs, knees, ankles, toes): None, normal, Trunk Movements Neck, shoulders, hips: None, normal, Overall Severity Severity of abnormal movements (highest score from questions above): None, normal Incapacitation due to abnormal movements: None, normal Patient's awareness of abnormal movements (rate only patient's report): No Awareness, Dental Status Current problems with teeth and/or dentures?: No Does patient usually wear dentures?: No  CIWA:  CIWA-Ar Total: 0 COWS:  COWS Total Score: 0  Treatment Plan Summary: Daily contact with patient to assess and evaluate symptoms and progress in treatment and Medication management Major Depression: continue the Lexapro at 30 mg and the Abilify at 10 mg Will work on lifestyle changes that can help better manage her depression Cocaine Abuse; monitor mood instability coming from withdrawal, work a relapse prevention plan CBT/minduflness  Medical Decision Making:  Review of Psycho-Social Stressors (1), Review of Medication Regimen & Side Effects (2) and Review of New Medication or Change in Dosage (2)     Vallory Oetken A 07/06/2014, 3:32 PM

## 2014-07-06 NOTE — Progress Notes (Signed)
Patient ID: Randy [name], male   DOB: 08-Aug-1970, 44 y.o.   MRN: [id number]  Pt currently presents with a flat affect and depressed/guarded behavior. Per self inventory, pt rates depression at a 6, hopelessness 6 and anxiety 5. Pt reports "I don't know" when asked about her daily goal throughout the day. Pt reports fair sleep, poor concentration and a fair appetite.   Pt provided with medications per providers orders. Pt's labs and vitals were monitored throughout the day. Pt supported emotionally and encouraged to express concerns and questions. Pt educated on medications.  Pt's safety ensured with 15 minute and environmental checks. Pt currently denies SI/HI and A/V hallucinations. Pt verbally agrees to seek staff if SI/HI or A/VH occurs and to consult with staff before acting on these thoughts. Pt remains in room for most of the day except when attending groups. Minimal interaction with pt throughout the day.

## 2014-07-07 NOTE — Plan of Care (Signed)
Problem: Consults Goal: Depression Patient Education See Patient Education Module for education specifics.  Outcome: Completed/Met Date Met:  07/07/14 Nurse discussed depression with patient.     

## 2014-07-07 NOTE — Progress Notes (Signed)
.  Psychoeducational Group Note    Date: 07/07/2014 Time: 0930    Goal Setting Purpose of Group: To be able to set a goal that is measurable and that can be accomplished in one day Participation Level:  Active  Participation Quality:  Appropriate  Affect:  Appropriate  Cognitive:  Oriented  Insight:  Improving  Engagement in Group:  Engaged  Additional Comments:  Pt participating and adding to the group.   Randy [name] A  

## 2014-07-07 NOTE — Progress Notes (Signed)
D.  Pt remained in bed for duration of evening shift and did not get up to attend wrap up group.  Minimal interaction, no complaints voiced.  Denies SI/HI/hallucinations at this time.  A.  Support and encouragement offered  R.  Pt remains safe on unit, will continue to monitor.

## 2014-07-07 NOTE — Progress Notes (Signed)
Patient met lying in bed with eyes open. Patient stated "I feel tired all day. Can I please get my medications, I want to sleep". Patient denies pain, SI,AH/VH. Patient continues to endorse depression 6/10. Made no new complaint. Every 15 minutes check maintained for safety. Patient encouraged to continue with the treatment plan and verbalize needs to staff. Will continue to monitor for safety and stability

## 2014-07-07 NOTE — Plan of Care (Signed)
Problem: Consults Goal: [facility] General Treatment Patient Education Outcome: Completed/Met Date Met:  07/07/14 Discussed treatment plan with patient.

## 2014-07-07 NOTE — Progress Notes (Signed)
D:  Patient's self inventory sheet, patient slept fair last night, did not take sleep medication.  Fair appetite, low energy level, good concentration.  Rated depression 5, hopeless and anxiety 4.  Denied withdrawals.  Denied SI.  Denied physical problems.  Denied physical pain.  Does have discharge plans.  No problems anticipated after discharge. A:  Medications administered per MD orders.  Emotional support and encouragement given patient. R:  Denied SI and HI.  Denied A/V hallucinations.  Safety maintained with 15 minute checks.

## 2014-07-07 NOTE — [facility] Group Notes (Signed)
[facility] Group Notes:  (Clinical Social Work)  07/07/2014   1:15-2:15PM  Summary of Progress/Problems:   The main focus of today's process group was for the patient to identify ways in which they have sabotaged their own mental health wellness/recovery.  Motivational interviewing and a handout were used to explore the benefits and costs of their self-sabotaging behavior as well as the benefits and costs of changing this behavior.  The Stages of Change were explained to the group using a handout, and patients identified where they are with regard to changing self-defeating behaviors.  The patient expressed she self-sabotages with isolation.  She does not know what positives could come out of her making a decision to no longer isolate, stating with a laugh that she has never done it, so cannot know.  Type of Therapy:  Process Group  Participation Level:  Active  Participation Quality:  Attentive and Sharing  Affect:  Not Congruent  Cognitive:  Appropriate  Insight:  Developing/Improving  Engagement in Therapy:  Engaged  Modes of Intervention:  Education, Motivational Interviewing   Randy MantleMareida Grossman-Orr, LCSW 07/07/2014, 4:00pm

## 2014-07-07 NOTE — [facility] Group Notes (Signed)
The focus of this group is to educate the patient on the purpose and policies of crisis stabilization and provide a format to answer questions about their admission.  The group details unit policies and expectations of patients while admitted.  Patient attended 0900 nurse education orientation group this morning.  Patient listened attentively, appropriate affect, alert, appropriate insight and engagement.  Patient will think about his goal for today.

## 2014-07-07 NOTE — Progress Notes (Signed)
Randy [name]  07/07/2014 12:47 PM Randy [name]  MRN:  [id number]  Subjective:    Randy [name] recognizes that being off her medications exacerbated her depression symptoms.  Knows she must take her Abilify  Rates Depression 5/10 Anxiety 4/10  Denies SI AVH    Participating in groupus    Hopeful to go home on Monday, requesting medication samples   Plans to f/u with Dr Jannifer FranklinAkintayo and is hopeful to secure  a therapist   Principal Problem: MDD (major depressive disorder), recurrent severe, without psychosis Diagnosis:   Patient Active Problem List   Diagnosis Date Noted  . MDD (major depressive disorder), recurrent severe, without psychosis [F33.2] 07/05/2014  . Major depression, recurrent [F33.9] 04/10/2012  . Cocaine dependence [F14.20] 04/10/2012  . Polysubstance abuse including IVDA (heroin), cocaine, marijuana [F19.10] 01/10/2012  . Elevated LFTs [R79.89] 01/10/2012  . Chronic hepatitis C without hepatic coma [B18.2] 01/10/2012  . 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 Tobacco abuse [Z72.0] 01/10/2012  . Anxiety disorder [F41.9] 08/14/2011  . Cocaine abuse [F14.10] 08/14/2011  . Recurrent major depression-severe [F33.2] 08/13/2011   Total Time spent with patient: 30 minutes   Past Medical History:  Past Medical History  Diagnosis Date  . Depression   . Hepatitis C   . 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 Cellulitis  Of  Left Forearm 01/10/2012    From IVDA    Past Surgical History  Procedure Laterality Date  . I&d extremity  01/13/2012    Procedure: IRRIGATION AND DEBRIDEMENT EXTREMITY;  Surgeon: Kennieth RadArthur F Carter, MD;  Location: [facility];  Service: Orthopedics;  Laterality: Left;   Family History:  Family History  Problem Relation Age of Onset  . Idiopathic pulmonary fibrosis Father    Social History:  History  Alcohol Use  . 0.0 oz/week    Comment: used once     History  Drug Use  . Yes  . Special: Heroin,  "Crack" cocaine    Comment: heroin last used 06/30/14    History   Social History  . Marital Status: Single    Spouse Name: N/A  . Number of Children: N/A  . Years of Education: N/A   Occupational History  . 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 Smoking status: Current Every Day Smoker -- 0.50 packs/day for 20 years    Types: Cigarettes  . Smokeless tobacco: Never Used     Comment: trying to quit-using e-cig some  . Alcohol Use: 0.0 oz/week     Comment: used once  . Drug Use: Yes    Special: Heroin, "Crack" cocaine     Comment: heroin last used 06/30/14  . Sexual Activity:    Partners: Male    Birth Control/ Protection: Condom     Comment: Has been HIV tested in past and has been negative.   Other Topics Concern  . None   Social History Narrative   Transgendered male.  Single.  Lives alone.  Independent.   Additional History:    Sleep: Fair  Appetite:  Fair   Assessment:   Musculoskeletal: Strength & Muscle Tone: within normal limits Gait & Station: normal Patient leans: N/A   Psychiatric Specialty Exam: Physical Exam  Constitutional: He is oriented to person, place, and time. He appears well-nourished.  HENT:  Head: Normocephalic.  Neck: Normal range of motion. Neck supple.  Musculoskeletal: Normal range of motion.  Neurological: He is  alert and oriented to person, place, and time.  Skin: Skin is warm and dry.    ROS  Blood pressure 115/79, pulse 73, temperature 98 F (36.7 C), temperature source Oral, resp. rate 17, height  (1.651 m), weight 0 kg (0 lb).Body mass index is 0.00 kg/(m^2).  General Appearance: Disheveled  Eye Solicitor::  Fair  Speech:  Clear and Coherent  Volume:  Decreased  Mood:  Anxious and Depressed  Affect:  anxious worried depressed  Thought Process:  Coherent and Goal Directed  Orientation:  Full (Time, Place, and Person)  Thought Content:  symptoms events worries concerns  Suicidal  Thoughts:  No  Homicidal Thoughts:  No  Memory:  Immediate;   Fair Recent;   Fair Remote;   Fair  Judgement:  Fair  Insight:  Present  Psychomotor Activity:  Normal  Concentration:  Fair  Recall:  [employer] of Knowledge:Fair  Language: Fair  Akathisia:  No  Handed:  Right  AIMS (if indicated):     Assets:  Desire for Improvement  ADL's:  Intact  Cognition: WNL  Sleep:        Current Medications: Current Facility-Administered Medications  Medication Dose Route Frequency Provider Last Rate Last Dose  . acetaminophen (TYLENOL) tablet 650 mg  650 mg Oral Q6H PRN Earney Navy, NP      . alum & mag hydroxide-simeth (MAALOX/MYLANTA) 200-200-20 MG/5ML suspension 30 mL  30 mL Oral Q4H PRN Earney Navy, NP      . ARIPiprazole (ABILIFY) tablet 10 mg  10 mg Oral Daily [name] Fee, MD   10 mg at 07/06/14 2006  . escitalopram (LEXAPRO) tablet 30 mg  30 mg Oral Daily [name] Fee, MD   30 mg at 07/06/14 2006  . estradiol (ESTRACE) tablet 2 mg  2 mg Oral TID [name] Fee, MD   2 mg at 07/07/14 1150  . magnesium hydroxide (MILK OF MAGNESIA) suspension 30 mL  30 mL Oral Daily PRN Earney Navy, NP      . nicotine (NICODERM CQ - dosed in mg/24 hours) patch 21 mg  21 mg Transdermal Daily Craige Cotta, MD   21 mg at 07/07/14 1610    Lab Results:  No results found for this or any previous visit (from the past 48 hour(s)).  Physical Findings: AIMS: Facial and Oral Movements Muscles of Facial Expression: None, normal Lips and Perioral Area: None, normal Jaw: None, normal Tongue: None, normal,Extremity Movements Upper (arms, wrists, hands, fingers): None, normal Lower (legs, knees, ankles, toes): None, normal, Trunk Movements Neck, shoulders, hips: None, normal, Overall Severity Severity of abnormal movements (highest score from questions above): None, normal Incapacitation due to abnormal movements: None, normal Patient's awareness of abnormal movements (rate only  patient's report): No Awareness, Dental Status Current problems with teeth and/or dentures?: No Does patient usually wear dentures?: No  CIWA:  CIWA-Ar Total: 1 COWS:  COWS Total Score: 1  Treatment Plan Summary: Daily contact with patient to assess and evaluate symptoms and progress in treatment and Medication management Major Depression: continue the Lexapro at 30 mg and the Abilify at 10 mg Discussed incorporating gratitude and positive thinking into plan for well being   Medical Decision Making:  Review of Psycho-Social Stressors (1), Review of Medication Regimen & Side Effects (2) and Review of New Medication or Change in Dosage (2) -denies side effects    Lorinda Creed  PMHNP 07/07/2014, 12:47 PM  I agreed with the  findings, treatment and disposition plan of this patient. Kathryne Sharper, MD

## 2014-07-08 NOTE — Progress Notes (Signed)
[facility] MD Progress Note  07/08/2014 12:54 PM Randy [name]  MRN:  [id number]  Subjective:    Randy [name] recognizes that being off her medications exacerbated her depression symptoms.  Knows she must take her Abilify  Rates Depression 4/10 Anxiety 4/10  Denies SI AVH    Participating in groups --  Breathing exercises were helpful    Sleep and appetite are fair   Hopeful to go home on Monday, requesting medication samples.  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 MDD (major depressive disorder), recurrent severe, without psychosis [F33.2] 07/05/2014  . Major depression, recurrent [F33.9] 04/10/2012  . Cocaine dependence [F14.20] 04/10/2012  . Polysubstance abuse including IVDA (heroin), cocaine, marijuana [F19.10] 01/10/2012  . Elevated LFTs [R79.89] 01/10/2012  . Chronic hepatitis C without hepatic coma [B18.2] 01/10/2012  . 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 Tobacco abuse [Z72.0] 01/10/2012  . Anxiety disorder [F41.9] 08/14/2011  . Cocaine abuse [F14.10] 08/14/2011  . Recurrent major depression-severe [F33.2] 08/13/2011   Total Time spent with patient: 30 minutes   Past Medical History:  Past Medical History  Diagnosis Date  . Depression   . Hepatitis C   . 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 Cellulitis  Of  Left Forearm 01/10/2012    From IVDA    Past Surgical History  Procedure Laterality Date  . I&d extremity  01/13/2012    Procedure: IRRIGATION AND DEBRIDEMENT EXTREMITY;  Surgeon: Kennieth Rad, MD;  Location: [facility];  Service: Orthopedics;  Laterality: Left;   Family History:  Family History  Problem Relation Age of Onset  . Idiopathic pulmonary fibrosis Father    Social  History:  History  Alcohol Use  . 0.0 oz/week    Comment: used once     History  Drug Use  . Yes  . Special: Heroin, "Crack" cocaine    Comment: heroin last used 06/30/14    History   Social History  . Marital Status: Single    Spouse Name: Randy [name]  . Number of Children: Randy [name]  . Years of Education: Randy [name]   Occupational History  . 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 Smoking status: Current Every Day Smoker -- 0.50 packs/day for 20 years    Types: Cigarettes  . Smokeless tobacco: Never Used     Comment: trying to quit-using e-cig some  . Alcohol Use: 0.0 oz/week     Comment: used once  . Drug Use: Yes    Special: Heroin, "Crack" cocaine     Comment: heroin last used 06/30/14  . Sexual Activity:    Partners: Male    Birth Control/ Protection: Condom     Comment: Has been HIV tested in past and has been negative.   Other Topics Concern  . None   Social History Narrative   Transgendered male.  Single.  Lives alone.  Independent.   Additional History:    Sleep: Fair  Appetite:  Fair   Assessment:   Musculoskeletal: Strength & Muscle Tone: within normal limits Gait & Station: normal Patient leans: Randy [name]   Psychiatric Specialty Exam: Physical Exam  Constitutional: He is oriented to person, place, and time. He appears well-nourished.  HENT:  Head: Normocephalic.  Neck: Normal range of motion. Neck supple.  Musculoskeletal: Normal range of motion.  Neurological: He is alert and oriented to person, place, and time.  Skin: Skin is warm and dry.    ROS  Blood pressure 98/67, pulse 78, temperature 97.8 F (36.6 C), temperature source Oral, resp. rate 19, height  (1.651 m), weight 0 kg (0 lb).Body mass index is 0.00 kg/(m^2).  General Appearance: Disheveled  Eye Solicitor::  Fair  Speech:  Clear and Coherent  Volume:  Decreased  Mood:  Anxious and Depressed  Affect:  anxious worried depressed  Thought Process:  Coherent and  Goal Directed  Orientation:  Full (Time, Place, and Person)  Thought Content:  symptoms events worries concerns  Suicidal Thoughts:  No  Homicidal Thoughts:  No  Memory:  Immediate;   Fair Recent;   Fair Remote;   Fair  Judgement:  Fair  Insight:  Present  Psychomotor Activity:  Normal  Concentration:  Fair  Recall:  [employer] of Knowledge:Fair  Language: Fair  Akathisia:  No  Handed:  Right  AIMS (if indicated):     Assets:  Desire for Improvement  ADL's:  Intact  Cognition: WNL  Sleep:        Current Medications: Current Facility-Administered Medications  Medication Dose Route Frequency Provider Last Rate Last Dose  . acetaminophen (TYLENOL) tablet 650 mg  650 mg Oral Q6H PRN Earney Navy, NP      . alum & mag hydroxide-simeth (MAALOX/MYLANTA) 200-200-20 MG/5ML suspension 30 mL  30 mL Oral Q4H PRN Earney Navy, NP      . ARIPiprazole (ABILIFY) tablet 10 mg  10 mg Oral Daily [name] Fee, MD   10 mg at 07/07/14 2151  . escitalopram (LEXAPRO) tablet 30 mg  30 mg Oral Daily [name] Fee, MD   30 mg at 07/07/14 2151  . estradiol (ESTRACE) tablet 2 mg  2 mg Oral TID [name] Fee, MD   2 mg at 07/08/14 1203  . magnesium hydroxide (MILK OF MAGNESIA) suspension 30 mL  30 mL Oral Daily PRN Earney Navy, NP      . nicotine (NICODERM CQ - dosed in mg/24 hours) patch 21 mg  21 mg Transdermal Daily Craige Cotta, MD   21 mg at 07/08/14 4098    Lab Results:  No results found for this or any previous visit (from the past 48 hour(s)).  Physical Findings: AIMS: Facial and Oral Movements Muscles of Facial Expression: None, normal Lips and Perioral Area: None, normal Jaw: None, normal Tongue: None, normal,Extremity Movements Upper (arms, wrists, hands, fingers): None, normal Lower (legs, knees, ankles, toes): None, normal, Trunk Movements Neck, shoulders, hips: None, normal, Overall Severity Severity of abnormal movements (highest score from questions above):  None, normal Incapacitation due to abnormal movements: None, normal Patient's awareness of abnormal movements (rate only patient's report): No Awareness, Dental Status Current problems with teeth and/or dentures?: No Does patient usually wear dentures?: No  CIWA:  CIWA-Ar Total: 0 COWS:  COWS Total Score: 1  Treatment Plan Summary: Daily contact with patient to assess and evaluate symptoms and progress in treatment and Medication management   Review of chart, vital signs, medications and notes Daily contact with the patient to assess and evaluate synmptoms and progress in treatment  1. Continue crisis management and stabilization. Estimated length of stay 5-7 days  2.  Medication management to reduce current symptoms to base line and  improve patient's overall level of functioning      Medications reviewed with the apteint and no untoward effects Major Depression: continue the Lexapro at 30 mg and the Abilify at 10 mg          Individual and group therapy encouraged     Coping skills for depression, substance abuse, and anxiety  3.  Treat health problems as indicated. 4.  Develop treatment plan to decrease risk of relapse upon discharge and the need for readmission 5.  Psych-social education regarding relapse prevention and self care. 6.  Health care follow up as needed for medical problems 7.  Continue home medications where appropriate 8.  Disposition in progress   Discussed incorporating gratitude and positive thinking into daily plan for well being   Medical Decision Making:  Review of Psycho-Social Stressors (1), Review of Medication Regimen & Side Effects (2) and Review of New Medication or Change in Dosage (2) -denies side effects    LARACH, MARY  PMHNP 07/08/2014, 12:54 PM  I agreed with the findings, treatment and disposition plan of this patient. Kathryne SharperSyed Ahlijah Raia, MD

## 2014-07-08 NOTE — Progress Notes (Signed)
.  Psychoeducational Group Note    Date: 07/08/2014 Time: 1000   Goal Setting Purpose of Group: To be able to set a goal that is measurable and that can be accomplished in one day Participation Level:  Minimal  Participation Quality:  Participated and was interested in what others had to say  Affect:  Flat  Cognitive:  Oriented  Insight:  Improving  Engagement in Group:  Limited  Additional Comments:    Dione HousekeeperJudge, Deziya Amero A

## 2014-07-08 NOTE — [facility] Group Notes (Signed)
[facility] Group Notes:  (Clinical Social Work)  07/08/2014   1:15-2:15PM  Summary of Progress/Problems:  The main focus of today's process group was to   identify the patient's current support system and decide on other supports that can be put in place.  The picture on workbook was used to discuss why additional supports are needed.  An emphasis was placed on using counselor, doctor, therapy groups, 12-step groups, and problem-specific support groups to expand supports.   There was also an extensive discussion about what constitutes a healthy support versus an unhealthy support.  The patient expressed full comprehension of the concepts presented.  One current healthy support is a friend.  Her partner and pt are co-dependents, she stated, and she is trying to end that relationship.  Type of Therapy:  Process Group  Participation Level:  Active  Participation Quality:  Attentive and Sharing  Affect:  Blunted  Cognitive:  Appropriate and Oriented  Insight:  Developing/Improving  Engagement in Therapy:  Engaged  Modes of Intervention:  Education,  Support and [employer] FoodsProcessing  Sayan Aldava Grossman-Orr, LCSW 07/08/2014, 4:00pm

## 2014-07-08 NOTE — Plan of Care (Signed)
Problem: Alteration in mood Goal: LTG-Patient reports reduction in suicidal thoughts (Patient reports reduction in suicidal thoughts and is able to verbalize a safety plan for whenever patient is feeling suicidal)  Outcome: Progressing Pt denied suicidal ideation this evening

## 2014-07-08 NOTE — Progress Notes (Signed)
D) Pt has been attending the groups and interacting with her peers. Rates her depression at a 4, hopelessness at a 4 and her anxiety at a 4. Denies SI and HI. States she is feeling better overall. Affect is less intense and is more spontaneous in her responses. A) Pt given support and reassurance along with praise. Encouragement given. Provided with a 1:1 R) Pt denies SI and HI.

## 2014-07-08 NOTE — Plan of Care (Signed)
Problem: Alteration in mood Goal: LTG-Patient reports reduction in suicidal thoughts (Patient reports reduction in suicidal thoughts and is able to verbalize a safety plan for whenever patient is feeling suicidal)  Outcome: Progressing Pt denies suicidal ideation this evening, and brightens with conversation

## 2014-07-08 NOTE — Progress Notes (Signed)
D.  Pt pleasant on approach, remained in bed during evening wrap up group, but it was reported that she does attend groups during day.  No complaints voiced this evening.  Denies SI/HI/hallucinations at this time.  A.  Support and encouragement offered  R.  Pt remains safe on unit, will continue to monitor.

## 2014-07-09 MED ORDER — ESTRADIOL 2 MG PO TABS: 2.0000 mg | ORAL_TABLET | Freq: Three times a day (TID) | ORAL | Status: DC

## 2014-07-09 MED ORDER — NICOTINE 21 MG/24HR TD PT24: 21.0000 mg | MEDICATED_PATCH | Freq: Every day | TRANSDERMAL | Status: DC

## 2014-07-09 MED ORDER — ESCITALOPRAM OXALATE 20 MG PO TABS
30.0000 mg | ORAL_TABLET | Freq: Every day | ORAL | Status: DC
  Filled 2014-07-09: qty 45

## 2014-07-09 MED ORDER — FINASTERIDE 5 MG PO TABS: 5.0000 mg | ORAL_TABLET | Freq: Every day | ORAL | Status: DC

## 2014-07-09 MED ORDER — ARIPIPRAZOLE 10 MG PO TABS: 10.0000 mg | ORAL_TABLET | Freq: Every day | ORAL | Status: DC

## 2014-07-09 MED ORDER — ESCITALOPRAM OXALATE 10 MG PO TABS: 30.0000 mg | ORAL_TABLET | Freq: Every day | ORAL | Status: DC

## 2014-07-09 NOTE — Progress Notes (Signed)
Patient ID: Randy [name], male   DOB: 01-07-1971, 44 y.o.   MRN: [id number] She has been discharged home and was provided a bus pass. She voiced understanding of discharge teaching about medications and follow up care. She denies SI thoughts and all her belongs were taken home with her.

## 2014-07-09 NOTE — [facility] Group Notes (Signed)
   [facility]  07/09/2014  8:45 AM   Participation Quality: Alert, Appropriate and Oriented  Mood/Affect: Appropriate  Depression Rating: 3  Anxiety Rating: 3  Thoughts of Suicide: Pt denies SI/HI  Will you contract for safety? Yes  Current AVH: Pt denies  Plan for Discharge/Comments: Pt attended discharge planning group and actively participated in group. CSW provided pt with today's workbook. Patient reports feeling ready to discharge. She plans on returning home to follow up with [facility] for outpatient services.  Transportation Means: Pt reports access to transportation  Supports: No supports mentioned at this time  Samuella BruinKristin Roniyah Llorens, MSW, [employer] IncLCSWA Clinical Social Worker [facility] [phone]

## 2014-07-09 NOTE — [facility] Suicide Risk Assessment (Signed)
[facility]   Demographic Factors:  Caucasian  Total Time spent with patient: 30 minutes  Musculoskeletal: Strength & Muscle Tone: within normal limits Gait & Station: normal Patient leans: N/A  Psychiatric Specialty Exam: Physical Exam  Review of Systems  Constitutional: Negative.   HENT: Negative.   Eyes: Negative.   Respiratory: Negative.   Cardiovascular: Negative.   Gastrointestinal: Negative.   Genitourinary: Negative.   Musculoskeletal: Negative.   Skin: Negative.   Neurological: Negative.   Endo/Heme/Allergies: Negative.   Psychiatric/Behavioral: Positive for depression and substance abuse.    Blood pressure 100/67, pulse 79, temperature 98.4 F (36.9 C), temperature source Oral, resp. rate 16, height 5\' 5"  (1.651 m), weight 0 kg (0 lb).Body mass index is 0.00 kg/(m^2).  General Appearance: Fairly Groomed  Patent attorneyye Contact::  Fair  Speech:  Clear and Coherent409  Volume:  Normal  Mood:  Euthymic  Affect:  Appropriate  Thought Process:  Coherent and Goal Directed  Orientation:  Full (Time, Place, and Person)  Thought Content:  plans as she moves on, relapse prevention   Suicidal Thoughts:  No  Homicidal Thoughts:  No  Memory:  Immediate;   Fair Recent;   Fair Remote;   Fair  Judgement:  Fair  Insight:  Present  Psychomotor Activity:  Normal  Concentration:  Fair  Recall:  [employer]  Fund of Knowledge:Fair  Language: Fair  Akathisia:  No  Handed:  Right  AIMS (if indicated):     Assets:  Desire for Improvement Housing Resilience Social Support  Sleep:  Number of Hours: 5.5  Cognition: WNL  ADL's:  Intact   Have you used any form of tobacco in the last 30 days? (Cigarettes, Smokeless Tobacco, Cigars, and/or Pipes): Yes  Has this patient used any form of tobacco in the last 30 days? (Cigarettes, Smokeless Tobacco, Cigars, and/or Pipes) Yes, A prescription for an FDA-approved tobacco cessation medication was offered at discharge and  the patient refused  Mental Status Per Nursing Assessment::   On Admission:  Self-harm thoughts  Current Mental Status by Physician: In full contact with reality. There are no active S/S of withdrawal. There are no active SI plans or intent. Plans not to see the person she was in relationship with. States she has some good supportive friends and has work waiting for her.    Loss Factors: Loss of significant relationship  Historical Factors: NA  Risk Reduction Factors:   Sense of responsibility to family, Employed and Positive social support  Continued Clinical Symptoms:  Depression:   Comorbid alcohol abuse/dependence Alcohol/Substance Abuse/Dependencies  Cognitive Features That Contribute To Risk:  Closed-mindedness    Suicide Risk:  Minimal: No identifiable suicidal ideation.  Patients presenting with no risk factors but with morbid ruminations; may be classified as minimal risk based on the severity of the depressive symptoms  Principal Problem: MDD (major depressive disorder), recurrent severe, without psychosis Discharge Diagnoses:  Patient Active Problem List   Diagnosis Date Noted  . Major depression, recurrent [F33.9] 04/10/2012    Priority: High  . Cocaine dependence [F14.20] 04/10/2012    Priority: High  . Anxiety disorder [F41.9] 08/14/2011    Priority: High  . Cocaine abuse [F14.10] 08/14/2011    Priority: High  . MDD (major depressive disorder), recurrent severe, without psychosis [F33.2] 07/05/2014  . Polysubstance abuse including IVDA (heroin), cocaine, marijuana [F19.10] 01/10/2012  . Elevated LFTs [R79.89] 01/10/2012  . Chronic hepatitis C without hepatic coma [B18.2] 01/10/2012  . Hormonal imbalance in  transgender patient [E34.9, F64.1] 01/10/2012  . Tobacco abuse [Z72.0] 01/10/2012  . Recurrent major depression-severe [F33.2] 08/13/2011    Follow-up Information    Follow up with [facility]  On 07/30/2014.   Why:  Monday March 21st  at 2:45pm with Dr. Hortencia Pilar information:   [name] [place], [place] [address] Phone: [phone] Fax: [phone]      Plan Of Care/Follow-up recommendations:  Activity:  as tolerated  Diet:  heart healthy Follow up with  Dr. Jannifer Franklin Is patient on multiple antipsychotic therapies at discharge:  No   Has Patient had three or more failed trials of antipsychotic monotherapy by history:  No  Recommended Plan for Multiple Antipsychotic Therapies: NA    Randy [name] A 07/09/2014, 1:03 PM

## 2014-07-09 NOTE — Discharge Summary (Signed)
Physician Discharge Summary Note  Patient:  Randy [name] is an 44 y.o., male MRN:  [id number] DOB:  1970-06-26 Patient phone:  [phone] (home)  Patient address:   [address] [place] [address],  Total Time spent with patient: 30 minutes  Date of Admission:  07/04/2014 Date of Discharge: 07/09/14  Reason for Admission:  Depressive symptoms  Principal Problem: MDD (major depressive disorder), recurrent severe, without psychosis Discharge Diagnoses: Patient Active Problem List   Diagnosis Date Noted  . MDD (major depressive disorder), recurrent severe, without psychosis [F33.2] 07/05/2014  . Major depression, recurrent [F33.9] 04/10/2012  . Cocaine dependence [F14.20] 04/10/2012  . Polysubstance abuse including IVDA (heroin), cocaine, marijuana [F19.10] 01/10/2012  . Elevated LFTs [R79.89] 01/10/2012  . Chronic hepatitis C without hepatic coma [B18.2] 01/10/2012  . 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 Tobacco abuse [Z72.0] 01/10/2012  . Anxiety disorder [F41.9] 08/14/2011  . Cocaine abuse [F14.10] 08/14/2011  . Recurrent major depression-severe [F33.2] 08/13/2011   Musculoskeletal: Strength & Muscle Tone: within normal limits Gait & Station: normal Patient leans: N/A  Psychiatric Specialty Exam: Physical Exam  Psychiatric: He has a normal mood and affect. His speech is normal and behavior is normal. Judgment and thought content normal. Cognition and memory are normal.    Review of Systems  Constitutional: Negative.   HENT: Negative.   Eyes: Negative.   Respiratory: Negative.   Cardiovascular: Negative.   Gastrointestinal: Negative.   Genitourinary: Negative.   Musculoskeletal: Negative.   Skin: Negative.   Neurological: Negative.   Endo/Heme/Allergies: Negative.   Psychiatric/Behavioral: Positive for depression (Stabilizing with treatments ). Negative for suicidal ideas, hallucinations, memory loss and substance abuse. The  patient is not nervous/anxious and does not have insomnia.     Blood pressure 100/67, pulse 79, temperature 98.4 F (36.9 C), temperature source Oral, resp. rate 16, height  (1.651 m), weight 0 kg (0 lb).Body mass index is 0.00 kg/(m^2).  See Physician SRA     Past Medical History:  Past Medical History  Diagnosis Date  . Depression   . Hepatitis C   . 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 Cellulitis  Of  Left Forearm 01/10/2012    From IVDA    Past Surgical History  Procedure Laterality Date  . I&d extremity  01/13/2012    Procedure: IRRIGATION AND DEBRIDEMENT EXTREMITY;  Surgeon: Kennieth Rad, MD;  Location: [facility];  Service: Orthopedics;  Laterality: Left;   Family History:  Family History  Problem Relation Age of Onset  . Idiopathic pulmonary fibrosis Father    Social History:  History  Alcohol Use  . 0.0 oz/week    Comment: used once     History  Drug Use  . Yes  . Special: Heroin, "Crack" cocaine    Comment: heroin last used 06/30/14    History   Social History  . Marital Status: Single    Spouse Name: N/A  . Number of Children: N/A  . Years of Education: N/A   Occupational History  . 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 Smoking status: Current Every Day Smoker -- 0.50 packs/day for 20 years    Types: Cigarettes  . Smokeless tobacco: Never Used     Comment: trying to quit-using e-cig some  . Alcohol Use: 0.0 oz/week     Comment: used once  . Drug Use: Yes    Special: Heroin, "Crack"  cocaine     Comment: heroin last used 06/30/14  . Sexual Activity:    Partners: Male    Birth Control/ Protection: Condom     Comment: Has been HIV tested in past and has been negative.   Other Topics Concern  . None   Social History Narrative   Transgendered male.  Single.  Lives alone.  Independent.    Risk to Self: Is patient at risk for suicide?: No Risk to Others:   Prior Inpatient Therapy:   Prior  Outpatient Therapy:    Level of Care:  OP  Hospital Course:  Randy [name] is an 44 y.o. male that was assessed this day as a walk-in [facility]. Pt is a Caucasian, male to male transgendered person.  [name] reported that depression worsened within the past month after her partner left her for someone else. Pt reported SI with plan to overdose on medications. Patient was without Abilify due to cost/insurance coverage for a month but was compliant taking Lexapro. Pt has had previous attempts in the past and has a hx of inpatient and outpatient treatment. She was last here for depression in 2015. It was also caused by her relationship with ex BF. She is from [place] but has lived in [place] for most of her adult life. She is a IT sales professionalfreelance web programmer. She states that her depression has begun to affect her work. She has been on a few psychotropic meds but favors the Abilify the best as it helps with her anxiety and depression. She also states that the Lexapro works well for her.She is denying SI/HI/AVH at the moment.         Randy [name] was admitted to the adult unit where she was evaluated and her symptoms were identified. Her Abilify was increased to 10 mg daily for treatment of depression and Lexapro was continued at 30 mg daily.  Medication management was discussed and implemented. She was encouraged to participate in unit programming. Medical problems were identified and treated appropriately. Home medication was restarted as needed. Her Estrace 2 mg TID was continued for her transgendered status. She was evaluated each day by a clinical provider to ascertain the patient's response to treatment.  Improvement was noted by the patient's report of decreasing symptoms, improved sleep and appetite, affect, medication tolerance, behavior, and participation in unit programming.  The patient was asked each day to complete a self inventory noting mood, mental status, pain, new symptoms, anxiety and  concerns.         She responded well to medication and being in a therapeutic and supportive environment. Patient showed insight about how being off her Abilify exacerbated her depressive symptoms.  Positive and appropriate behavior was noted and the patient was motivated for recovery.  She worked closely with the treatment team and case manager to develop a discharge plan with appropriate goals. Coping skills, problem solving as well as relaxation therapies were also part of the unit programming. 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          By the day of discharge she was in much improved condition than upon admission.  Symptoms were reported as significantly decreased or resolved completely. The patient denied SI/HI and voiced no AVH. She was motivated to continue taking medication with a goal of continued improvement in mental health. Randy Caulmily Mateja was discharged home with a plan to follow up as noted below. The patient was provided with sample medications and prescriptions at  time of discharge. She left [facility] in stable condition with all belongings returned to her.   Consults:  None  Significant Diagnostic Studies:  Chemistry panel, CBC, UDS positive for opiates/cocaine  Discharge Vitals:   Blood pressure 100/67, pulse 79, temperature 98.4 F (36.9 C), temperature source Oral, resp. rate 16, height 5\' 5"  (1.651 m), weight 0 kg (0 lb). Body mass index is 0.00 kg/(m^2). Lab Results:   No results found for this or any previous visit (from the past 72 hour(s)).  Physical Findings: AIMS: Facial and Oral Movements Muscles of Facial Expression: None, normal Lips and Perioral Area: None, normal Jaw: None, normal Tongue: None, normal,Extremity Movements Upper (arms, wrists, hands, fingers): None, normal Lower (legs, knees, ankles, toes): None, normal, Trunk Movements Neck, shoulders, hips: None, normal, Overall Severity Severity of abnormal movements (highest  score from questions above): None, normal Incapacitation due to abnormal movements: None, normal Patient's awareness of abnormal movements (rate only patient's report): No Awareness, Dental Status Current problems with teeth and/or dentures?: No Does patient usually wear dentures?: No  CIWA:  CIWA-Ar Total: 0 COWS:  COWS Total Score: 1   See Psychiatric Specialty Exam and Suicide Risk Assessment completed by Attending Physician prior to discharge.  Discharge destination:  Home  Is patient on multiple antipsychotic therapies at discharge:  No   Has Patient had three or more failed trials of antipsychotic monotherapy by history:  No  Recommended Plan for Multiple Antipsychotic Therapies: NA     Medication List    STOP taking these medications        spironolactone 50 MG tablet  Commonly known as:  ALDACTONE      TAKE these medications      Indication   ARIPiprazole 10 MG tablet  Commonly known as:  ABILIFY  Take 1 tablet (10 mg total) by mouth daily.   Indication:  depression     escitalopram 10 MG tablet  Commonly known as:  LEXAPRO  Take 3 tablets (30 mg total) by mouth daily.   Indication:  Depression     estradiol 2 MG tablet  Commonly known as:  ESTRACE  Take 1 tablet (2 mg total) by mouth 3 (three) times daily.   Indication:  Deficiency of the Hormone Estrogen     finasteride 5 MG tablet  Commonly known as:  PROSCAR  Take 1 tablet (5 mg total) by mouth daily.   Indication:  part of his hormonal therapy for transgendering     nicotine 21 mg/24hr patch  Commonly known as:  NICODERM CQ - dosed in mg/24 hours  Place 1 patch (21 mg total) onto the skin daily.   Indication:  Nicotine Addiction       Follow-up Information    Follow up with [facility]  On 07/30/2014.   Why:  Monday March 21st at 2:45pm with Dr. Hortencia Pilar information:   [address] [place], [place] [address] Phone: [phone] Fax: [phone]      Follow-up recommendations:   Activity: as tolerated  Diet: heart healthy Follow up with Dr. Jannifer Franklin  Comments:   Take all your medications as prescribed by your mental healthcare provider.  Report any adverse effects and or reactions from your medicines to your outpatient provider promptly.  Patient is instructed and cautioned to not engage in alcohol and or illegal drug use while on prescription medicines.  In the event of worsening symptoms, patient is instructed to call the crisis hotline, 911 and  or go to the nearest ED for appropriate evaluation and treatment of symptoms.  Follow-up with your primary care provider for your other medical issues, concerns and or health care needs.   Total Discharge Time: Greater than 30 minutes  Signed: DAVIS, LAURA NP-C 07/09/2014, 3:32 PM  I personally assessed the patient and formulated the plan Madie Reno A. Dub Mikes, M.D.

## 2014-07-09 NOTE — Progress Notes (Signed)
  [facility] Discharge Plan :  Will you be returning to the same living situation after discharge:  Yes,  plans to return home At discharge, do you have transportation home?: Yes,  patient reports access to transportation Do you have the ability to pay for your medications: Yes,  patient will be provided with prescriptions at discharge  Release of information consent forms completed and in the chart;  Patient's signature needed at discharge.  Patient to Follow up at: Follow-up Information    Follow up with [facility]  On 07/30/2014.   Why:  Monday March 21st at 2:45pm with Dr. Hortencia PilarA.    Contact information:   [address] [place], [place] [address] Phone: [phone] Fax: [phone]      Patient denies SI/HI: Yes,  denies    Safety Planning and Suicide Prevention discussed: Yes,  with patient  Have you used any form of tobacco in the last 30 days? (Cigarettes, Smokeless Tobacco, Cigars, and/or Pipes): Yes  Has patient been referred to the Quitline?: Yes, faxed on 07/05/14  Talley Casco, [place] CarboKristin L 07/09/2014, 12:52 PM

## 2014-07-09 NOTE — Progress Notes (Signed)
Patient ID: Randy [name] Service, male   DOB: 08/12/1970, 44 y.o.   MRN: [id number] PER STATE REGULATIONS 482.30  THIS CHART WAS REVIEWED FOR MEDICAL NECESSITY WITH RESPECT TO THE PATIENT'S ADMISSION/ DURATION OF STAY.  NEXT REVIEW DATE: 07/12/2014  [name] [name], RN, BSN CASE MANAGER

## 2014-07-09 NOTE — Progress Notes (Signed)
Adult Psychoeducational Group Note  Date:  07/09/2014 Time:  12:28 AM  Group Topic/Focus:  Wrap-Up Group:   The focus of this group is to help patients review their daily goal of treatment and discuss progress on daily workbooks.  Participation Level:  Did Not Attend  Participation Quality:  Did Not Attend  Affect:  Did Not Attend  Cognitive:  Did Not Attend  Insight: None  Engagement in Group:  None  Modes of Intervention:  Discussion and Education  Additional Comments:  Pt did not attend group.  Malachy MoanJeffers, Girolamo Lortie S 07/09/2014, 12:28 AM

## 2014-07-09 NOTE — [facility] Group Notes (Signed)
[facility] LCSW Group Therapy 07/09/2014  1:15 pm  Type of Therapy: Group Therapy Participation Level: Active  Participation Quality: Attentive, Sharing and Supportive  Affect: Appropriate  Cognitive: Alert and Oriented  Insight: Developing/Improving and Engaged  Engagement in Therapy: Developing/Improving and Engaged  Modes of Intervention: Clarification, Confrontation, Discussion, Education, Exploration,  Limit-setting, Orientation, Problem-solving, Rapport Building, Dance movement psychotherapisteality Testing, Socialization and Support  Summary of Progress/Problems: Pt identified obstacles faced currently and processed barriers involved in overcoming these obstacles. Pt identified steps necessary for overcoming these obstacles and explored motivation (internal and external) for facing these difficulties head on. Pt further identified one area of concern in their lives and chose a goal to focus on for today. Patient identified her goal as "to get back to work" and reports that she does not see any obstacles that would prevent her. Patient actively listened during group but did not active in group discussion.  Samuella BruinKristin Reyes Fifield, MSW, [employer]CSWA Clinical Social Worker [facility] [phone]

## 2014-07-12 NOTE — Progress Notes (Signed)
Patient Discharge Instructions:  After Visit Summary (AVS):   Faxed to:  07/12/14 Discharge Summary Note:   Faxed to:  07/12/14 Psychiatric Admission Assessment Note:   Faxed to:  07/12/14 Suicide Risk Assessment - Discharge Assessment:   Faxed to:  07/12/14 Faxed/Sent to the Next Level Care provider:  07/12/14  Faxed to [facility] @ [phone]  Jerelene ReddenSheena E [place], 07/12/2014, 2:01 PM

## 2014-07-13 DIAGNOSIS — T50904A Poisoning by unspecified drugs, medicaments and biological substances, undetermined, initial encounter: Secondary | ICD-10-CM | POA: Diagnosis not present

## 2014-07-14 ENCOUNTER — Emergency Department (HOSPITAL_COMMUNITY)
Admission: EM | Admit: 2014-07-14 | Discharge: 2014-07-14 | Disposition: A | Discharge status: Home / Self Care | Payer: Medicare Other | Attending: Emergency Medicine | Admitting: Emergency Medicine

## 2014-07-14 ENCOUNTER — Encounter (HOSPITAL_COMMUNITY): Payer: Self-pay | Admitting: Oncology

## 2014-07-14 DIAGNOSIS — Z79899 Other long term (current) drug therapy: Secondary | ICD-10-CM | POA: Insufficient documentation

## 2014-07-14 DIAGNOSIS — E349 Endocrine disorder, unspecified: Secondary | ICD-10-CM | POA: Insufficient documentation

## 2014-07-14 DIAGNOSIS — Z872 Personal history of diseases of the skin and subcutaneous tissue: Secondary | ICD-10-CM | POA: Diagnosis not present

## 2014-07-14 DIAGNOSIS — F641 Gender identity disorder in adolescence and adulthood: Secondary | ICD-10-CM | POA: Diagnosis not present

## 2014-07-14 DIAGNOSIS — F329 Major depressive disorder, single episode, unspecified: Secondary | ICD-10-CM | POA: Insufficient documentation

## 2014-07-14 DIAGNOSIS — Z8619 Personal history of other infectious and parasitic diseases: Secondary | ICD-10-CM | POA: Diagnosis not present

## 2014-07-14 DIAGNOSIS — Z720 Tobacco use: Secondary | ICD-10-CM | POA: Insufficient documentation

## 2014-07-14 DIAGNOSIS — F1110 Opioid abuse, uncomplicated: Secondary | ICD-10-CM | POA: Insufficient documentation

## 2014-07-14 DIAGNOSIS — R9431 Abnormal electrocardiogram [ECG] [EKG]: Secondary | ICD-10-CM | POA: Diagnosis not present

## 2014-07-14 DIAGNOSIS — Z880 Allergy status to penicillin: Secondary | ICD-10-CM | POA: Diagnosis not present

## 2014-07-14 LAB — COMPREHENSIVE METABOLIC PANEL
ALBUMIN: 3.8 g/dL (ref 3.5–5.2)
ALT: 80 U/L — AB (ref 0–53)
AST: 49 U/L — AB (ref 0–37)
Alkaline Phosphatase: 39 U/L (ref 39–117)
Anion gap: 6 (ref 5–15)
BUN: 17 mg/dL (ref 6–23)
CO2: 24 mmol/L (ref 19–32)
CREATININE: 1.42 mg/dL — AB (ref 0.50–1.35)
Calcium: 8.2 mg/dL — ABNORMAL LOW (ref 8.4–10.5)
Chloride: 104 mmol/L (ref 96–112)
GFR calc Af Amer: 69 mL/min — ABNORMAL LOW (ref >90)
GFR calc non Af Amer: 60 mL/min — ABNORMAL LOW (ref >90)
Glucose, Bld: 117 mg/dL — ABNORMAL HIGH (ref 70–99)
Potassium: 3.4 mmol/L — ABNORMAL LOW (ref 3.5–5.1)
Sodium: 134 mmol/L — ABNORMAL LOW (ref 135–145)
TOTAL PROTEIN: 7 g/dL (ref 6.0–8.3)
Total Bilirubin: 0.7 mg/dL (ref 0.3–1.2)

## 2014-07-14 LAB — ACETAMINOPHEN LEVEL: Acetaminophen (Tylenol), Serum: <10 ug/mL — ABNORMAL LOW (ref 10–30)

## 2014-07-14 LAB — CBC
HCT: 46.8 % (ref 39.0–52.0)
Hemoglobin: 15.7 g/dL (ref 13.0–17.0)
MCH: 30.9 pg (ref 26.0–34.0)
MCHC: 33.5 g/dL (ref 30.0–36.0)
MCV: 92.1 fL (ref 78.0–100.0)
Platelets: ADEQUATE 10*3/uL (ref 150–400)
RBC: 5.08 MIL/uL (ref 4.22–5.81)
RDW: 13.4 % (ref 11.5–15.5)
WBC: 4.9 10*3/uL (ref 4.0–10.5)

## 2014-07-14 LAB — SALICYLATE LEVEL: Salicylate Lvl: <4 mg/dL (ref 2.8–20.0)

## 2014-07-14 LAB — ETHANOL: Alcohol, Ethyl (B): <5 mg/dL (ref 0–9)

## 2014-07-14 MED ORDER — NALOXONE HCL 1 MG/ML IJ SOLN
2.0000 mg | Freq: Once | INTRAMUSCULAR | Status: AC
  Administered 2014-07-14: 2 mg via INTRAVENOUS
  Filled 2014-07-14: qty 2

## 2014-07-14 NOTE — ED Notes (Signed)
Pt ambulated to the bathroom w/o assistance and with a steady gait.

## 2014-07-14 NOTE — ED Notes (Signed)
Pt's O2 sats dropped to 88% on RA.  2L via [place] applied w/ a rebound in O2 sats to 100%

## 2014-07-14 NOTE — ED Notes (Signed)
Bed: [id number] Expected date:  Expected time:  Means of arrival:  Comments: 40M heroin/crack OD Narcan given

## 2014-07-14 NOTE — ED Notes (Signed)
Pt transported from home by EMS after friends called 911 after pt stopped breathing post heroin and crack usage. Pt given at home Narcan x 2 (auto injector) and Narcan 2mg  IVP by EMS. A & O on arrival to ED. [facility] stated he did begin CPR on arrival, @ 26 compressions completed.

## 2014-07-14 NOTE — Discharge Instructions (Signed)
Narcotic Overdose °A narcotic overdose is the misuse or overuse of a narcotic drug. A narcotic overdose can make you pass out and stop breathing. If you are not treated right away, this can cause permanent brain damage or stop your heart. Medicine may be given to reverse the effects of an overdose. If so, this medicine may bring on withdrawal symptoms. The symptoms may be abdominal cramps, throwing up (vomiting), sweating, chills, and nervousness. °Injecting narcotics can cause more problems than just an overdose. AIDS, hepatitis, and other very serious infections are transmitted by sharing needles and syringes. If you decide to quit using, there are medicines which can help you through the withdrawal period. Trying to quit all at once on your own can be uncomfortable, but not life-threatening. Call your caregiver, Narcotics Anonymous, or any drug and alcohol treatment program for further help.  °Document Released: 06/04/2004 Document Revised: 07/20/2011 Document Reviewed: 03/29/2009 °ExitCare® Patient Information ©2015 [employer]. This information is not intended to replace advice given to you by your health care provider. Make sure you discuss any questions you have with your health care provider. ° ° °Emergency Department Resource Guide °1) Find a Doctor and Pay Out of Pocket °Although you won't have to find out who is covered by your insurance plan, it is a good idea to ask around and get recommendations. You will then need to call the office and see if the doctor you have chosen will accept you as a new patient and what types of options they offer for patients who are self-pay. Some doctors offer discounts or will set up payment plans for their patients who do not have insurance, but you will need to ask so you aren't surprised when you get to your appointment. ° °2) Contact Your Local Health Department °Not all health departments have doctors that can see patients for sick visits, but many do, so it is  worth a call to see if yours does. If you don't know where your local health department is, you can check in your phone book. The CDC also has a tool to help you locate your state's health department, and many state websites also have listings of all of their local health departments. ° °3) Find a Walk-in Clinic °If your illness is not likely to be very severe or complicated, you may want to try a walk in clinic. These are popping up all over the country in pharmacies, drugstores, and shopping centers. They're usually staffed by nurse practitioners or physician assistants that have been trained to treat common illnesses and complaints. They're usually fairly quick and inexpensive. However, if you have serious medical issues or chronic medical problems, these are probably not your best option. ° °No Primary Care Doctor: °- Call [facility] at  832-8000 - they can help you locate a primary care doctor that  accepts your insurance, provides certain services, etc. °- Physician Referral Service- 1-800-533-3463 ° °Chronic Pain Problems: °Organization         Address  Phone   Notes  °[facility]  (336) 297-2271 Patients need to be referred by their primary care doctor.  ° °Medication Assistance: °Organization         Address  Phone   Notes  °[place] [facility] [address] °[place],  [address] (336) 641-8030 --Must be a resident of [place] °-- Must have NO insurance coverage whatsoever (no Medicaid/ Medicare, etc.) °-- The pt. MUST have a primary care   doctor that directs their care regularly and follows them in the community °  °[facility]  (866) 331-1348   °[employer]  (888) 892-1162   ° °Agencies that provide inexpensive medical care: °Organization         Address  Phone   Notes  °[facility]  (336) 832-8035   °[facility]    (336) 832-7272   °[facility] [address] °[place],  [place] [address] (336) 832-4777   °[facility] [address], °[place] (336) 271-4999   °[facility]    (336) 373-0678   °[facility]    (336) 272-1050   °[facility] ° [address], [place] Phone:  (336) 832-4444, Fax:  (336) 832-4440 Hours of Operation:  9 am - 6 pm, M-F.  Also accepts Medicaid/Medicare and self-pay.  °[facility] ° [address], [place] Phone: (336) 832-3150, Fax: (336) 832-3151. Hours of Operation:  8:30 am - 5:30 pm, M-F.  Also accepts Medicaid and self-pay.  °[facility] [place] [address], [place] Phone: (336) 878-6027   °[facility] [address], [place], [place] (336)723-1848, Ext. 123 Mondays & Thursdays: 7-9 AM.  First 15 patients are seen on a first come, first serve basis. °  ° °Medicaid-accepting [place] Providers: ° °Organization         Address  Phone   Notes  °[facility] [address], [place] (336) 641-2100 Also accepts self-pay patients.  °[facility] [address], [place] ° (336) 856-9996   °[facility] [address], [place] (336) 288-8857   °[facility] [address] [place] [address], [place] (336) 299-7000   °Veita Bland [address], [place]  ° (336) 373-1557 Only accepts [place] Access Medicaid patients after they have their name applied to their card.  ° °Self-Pay (no insurance) in [place]: ° °Organization         Address  Phone   Notes  °Sickle Cell Patients, [facility] [address], [place] (336) 832-1970   °[facility] [address], [place] (336) 832-4400   °[facility] ° [address], [place] (336) 992-4800   °[facility]/Dr. Osei-Bonsu ° [address] [place] [address], [place] or [address], [place] (336) 841-8500 Phone number for both [place] and [place] locations is the same.  °[facility] [address], [place] (336) 299-0000   °[facility] [address] [place] [address], [place] or [address] (336) 852-7530 °(336) 878-2260   °[facility] [address], [place] (336) 350-1642, phone; (336) 294-5005, fax Sees patients 1st and 3rd Saturday of every month.  Must not qualify for public or private insurance (i.e. Medicaid, Medicare, [place] Health Choice, Veterans' Benefits) • Household income should be no more than 200% of the poverty level •The clinic cannot treat you if you are pregnant or think you are pregnant • Sexually transmitted diseases are not treated at the clinic.  ° ° °Dental Care: °Organization         Address  Phone  Notes  °[facility] [address], [place] (336) 641-6152 Accepts   children up to age 21 who are enrolled in Medicaid or [place] Health Choice; pregnant women with a Medicaid card; and children who have applied for Medicaid or [place] Health Choice, but were declined, whose parents can pay a reduced fee at time of service.  °[facility] [place]  [address], [place] (336) 641-7733 Accepts children up to age 21 who are enrolled in Medicaid or [place] Health Choice; pregnant women with a Medicaid card; and children who have applied for Medicaid or [place] Health Choice, but were declined, whose parents can pay a reduced fee at time of service.  °[facility] ° [address], [place] (336) 641-4533 Patients are seen by appointment only. Walk-ins are not accepted. [facility] will see patients 18 years of age and older. °Monday - Tuesday (8am-5pm) °Most Wednesdays (8:30-5pm) °$30 per visit, cash only  °[facility] ° [address], [place] (336) 641-4533  Patients are seen by appointment only. Walk-ins are not accepted. [facility] will see patients 18 years of age and older. °One Wednesday Evening (Monthly: Volunteer Based).  $30 per visit, cash only  °[facility]  (919) 537-3737 for adults; Children under age 4, call Graduate Pediatric Dentistry at (919) [phone]. Children aged 4-14, please call (919) [phone] to request a pediatric application. ° Dental services are provided in all areas of dental care including fillings, crowns and bridges, complete and partial dentures, implants, gum treatment, root canals, and extractions. Preventive care is also provided. Treatment is provided to both adults and children. °Patients are selected via a lottery and there is often a waiting list. °  °[facility] [address], °[place] ° (336) 763-8833 [email or web address] °  °[facility] [address], [place], [place] (336)723-1848, Ext. 123 Second and Fourth Thursday of each month, opens at 6:30 AM; Clinic ends at 9 AM.  Patients are seen on a first-come first-served basis, and a limited number are seen during each clinic.  ° °[facility] ° [address], [place], [place] (336) 723-7904   Eligibility Requirements °You must have lived in [place], [place], or [place] counties for at least the last three months. °  You cannot be eligible for state or federal sponsored healthcare insurance, including Veterans Administration, Medicaid, or Medicare. °  You generally cannot be eligible for healthcare insurance through your employer.  °  How to apply: °Eligibility screenings are held every Tuesday and Wednesday afternoon from 1:00 pm until 4:00 pm. You do not need an appointment for the interview!  °[facility] [address], [place], [place] 336-631-2330   °[facility]  336-342-8273   °[facility]  336-703-3100   °[facility]   336-570-6415   ° °Behavioral Health Resources in the Community: °Intensive Outpatient Programs °Organization         Address  Phone  Notes  °[facility] [address], [place], [place] 336-878-6098   °[facility] [address], [place], [place] 336-832-9800   °[facility] [address], [place], [place] ° 336-882-2125   °[facility] [address],  °,  1-800-853-5163 or 336-641-4981   °Substance Abuse Resources °Organization         Address  Phone  Notes  °[facility]  336-882-2125   °  [facility]  336-784-9470   °[facility]  336-285-9073   °[facility]  336-845-3988   °Residential & Outpatient Substance Abuse Program  1-800-659-3381   °Psychological Services °Organization         Address  Phone  Notes  °[facility]  336- 832-9600   °[facility]  336- 378-7881   °[facility] [address], [place] 1-800-853-5163 or 336-641-4981   ° °Mobile Crisis Teams °Organization         Address  Phone  Notes  °[facility], Mobile Crisis Care Unit  1-877-626-1772   °Assertive °[facility] ° [address] [place], [place] 336-834-9664   °Sharon DeEsch [address] °[place] 336-554-5454   ° °Self-Help/Support Groups °Organization         Address  Phone             Notes  °[facility] - variety of support groups  336- 373-1402 Call for more information  °Narcotics Anonymous (NA), [facility] [address], °[place]  2 meetings at this location  ° °Residential Treatment Programs °Organization         Address  Phone  Notes  °[facility] [address],    °[place]  1-866-801-8205   °[facility] ° [address], [place], [place] 704-293-8524   °[facility] [address], [place] 336-845-3988 Admissions: 8am-3pm M-F   °[facility] [address]    °[place], [place] 336-841-1104   °[facility] [address], [place], [place] 336-379-7146   °[facility] [address]  °, [place] 336-285-9073   °[facility] - Intensive Outpatient [address], , [place] 336-852-3033   °[facility] [address]  °[place], [place] 1-877-615-2722 or 336-784-9470   °[facility] [address] [place], [place] 336-227-7417 Accepts Medicaid  °[facility] [address]  ° [place] 1-800-659-3381 Substance Abuse/Addiction Treatment  ° °[place] Behavioral Health Resources °Organization         Address  Phone  Notes  °[facility]  (888) 581-9988   °Julie Brannon, PhD [address] [place], [place]   (336) 349-5553 or (336) 951-0000   °[facility]   [address] °[place], [place] (336) 349-4454   °[facility] [address], [place], [place] (336) 342-8316 Insurance/Medicaid/sponsorship through [facility]  °[facility] [address]                                    [place], [place] (336) 342-8316 Therapy/tele-psych/case  °[facility] [address]  ° [place], [place] (336) 349-2233    °Dr. Arfeen  (336) 349-4544   °[facility] [place]  [employer] [place] [facility] 1) [address], [place] °2) [address], [place] °3)  [address], [place] (336) 349-3220 °(336) 342-7768 ° °(336) 342-8140   °[place] [facility] (336) 342-1394 or (336) 342-3537 (After Hours)    ° ° ° °

## 2014-07-14 NOTE — ED Provider Notes (Signed)
CSN: [id number]     Arrival date & time 07/14/14  0014 History   First MD Initiated Contact with Patient 07/14/14 0139     Chief Complaint  Patient presents with  . Drug Overdose     (Consider location/radiation/quality/duration/timing/severity/associated sxs/prior Treatment) HPI Comments: Is a 44 year old transgender male to male who is brought in by EMS after overdosing on her when in respiratory arrest.  He was given Narcan at home and by EMS.  [employer] reports that they did 26.  Chest compressions.  Patient has an extensive psychiatric history with frequent hospitalizations for depression.  Patient is a 44 y.o. male presenting with Overdose. The history is provided by the patient.  Drug Overdose This is a recurrent problem. The current episode started today. The problem has been gradually improving. Pertinent negatives include no fever or weakness. Nothing aggravates the symptoms. He has tried nothing for the symptoms. The treatment provided mild relief.    Past Medical History  Diagnosis Date  . Depression   . Hepatitis C   . 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 Cellulitis  Of  Left Forearm 01/10/2012    From IVDA   Past Surgical History  Procedure Laterality Date  . I&d extremity  01/13/2012    Procedure: IRRIGATION AND DEBRIDEMENT EXTREMITY;  Surgeon: Kennieth RadArthur F Carter, MD;  Location: [facility];  Service: Orthopedics;  Laterality: Left;   Family History  Problem Relation Age of Onset  . Idiopathic pulmonary fibrosis Father    History  Substance Use Topics  . Smoking status: Current Every Day Smoker -- 0.50 packs/day for 20 years    Types: Cigarettes  . Smokeless tobacco: Never Used     Comment: trying to quit-using e-cig some  . Alcohol Use: 0.0 oz/week     Comment: used once    Review of Systems  Constitutional: Negative for fever.  Neurological: Negative for weakness.      Allergies  Penicillins  Home Medications   Prior to Admission medications    Medication Sig Start Date End Date Taking? Authorizing Provider  ARIPiprazole (ABILIFY) 10 MG tablet Take 1 tablet (10 mg total) by mouth daily. 07/09/14  Yes Fransisca KaufmannLaura Davis, NP  escitalopram (LEXAPRO) 10 MG tablet Take 3 tablets (30 mg total) by mouth daily. 07/09/14  Yes Fransisca KaufmannLaura Davis, NP  estradiol (ESTRACE) 2 MG tablet Take 1 tablet (2 mg total) by mouth 3 (three) times daily. 07/09/14  Yes Fransisca KaufmannLaura Davis, NP  finasteride (PROSCAR) 5 MG tablet Take 1 tablet (5 mg total) by mouth daily. 07/09/14  Yes Fransisca KaufmannLaura Davis, NP  nicotine (NICODERM CQ - DOSED IN MG/24 HOURS) 21 mg/24hr patch Place 1 patch (21 mg total) onto the skin daily. 07/09/14   Fransisca KaufmannLaura Davis, NP   BP 106/70 mmHg  Pulse 78  Temp(Src) 98 F (36.7 C) (Oral)  Resp 16  Ht 5\' 6"  (1.676 m)  Wt 170 lb (77.111 kg)  BMI 27.45 kg/m2  SpO2 97% Physical Exam  ED Course  Procedures (including critical care time) Labs Review Labs Reviewed  COMPREHENSIVE METABOLIC PANEL - Abnormal; Notable for the following:    Sodium 134 (*)    Potassium 3.4 (*)    Glucose, Bld 117 (*)    Creatinine, Ser 1.42 (*)    Calcium 8.2 (*)    AST 49 (*)    ALT 80 (*)    GFR calc non Af Amer 60 (*)    GFR calc Af Amer 69 (*)    All  other components within normal limits  ACETAMINOPHEN LEVEL - Abnormal; Notable for the following:    Acetaminophen (Tylenol), Serum <10.0 (*)    All other components within normal limits  CBC  ETHANOL  SALICYLATE LEVEL    Imaging Review No results found.   EKG Interpretation   Date/Time:  Saturday July 14 2014 00:26:37 EST Ventricular Rate:  91 PR Interval:  155 QRS Duration: 88 QT Interval:  389 QTC Calculation: 479 R Axis:   86 Text Interpretation:  Sinus rhythm Borderline prolonged QT interval since  last tracing no significant change Confirmed by Effie Shy  MD, ELLIOTT [id number])  on 07/14/2014 12:46:45 AM     She was given additional dose of Narcan in the emergency room because he had a respiratory rate of 6 and was quite  somnolent since that time he has been slightly hypotensive in the 90s and given IV fluids he's been responsive with respiratory rate in the 15-18 range with an O2 sat of 96-100% Patient has been maintaining his oxygen saturation MDM   Final diagnoses:  Opiate abuse, continuous         Arman Filter, NP 07/14/14 2009  Lyanne Co, MD 07/15/14 0500

## 2014-07-14 NOTE — ED Notes (Signed)
Pt advised of need for urine specimen, Dondra SpryGail, NP aware, no order for I&O cath at this time.  Urinal is beside pt.  Pt is more alert after 2 mg narcan.  Warm blanket given.

## 2014-09-17 ENCOUNTER — Inpatient Hospital Stay (HOSPITAL_COMMUNITY)
Admission: AD | Admit: 2014-09-17 | Discharge: 2014-09-21 | DRG: 885 | Disposition: A | Discharge status: Home / Self Care | Payer: Federal, State, Local not specified - Other | Attending: Psychiatry | Admitting: Psychiatry

## 2014-09-17 DIAGNOSIS — R45851 Suicidal ideations: Secondary | ICD-10-CM | POA: Diagnosis present

## 2014-09-17 DIAGNOSIS — F1424 Cocaine dependence with cocaine-induced mood disorder: Secondary | ICD-10-CM | POA: Insufficient documentation

## 2014-09-17 DIAGNOSIS — F17210 Nicotine dependence, cigarettes, uncomplicated: Secondary | ICD-10-CM | POA: Diagnosis present

## 2014-09-17 DIAGNOSIS — F329 Major depressive disorder, single episode, unspecified: Secondary | ICD-10-CM | POA: Diagnosis present

## 2014-09-17 DIAGNOSIS — F1124 Opioid dependence with opioid-induced mood disorder: Secondary | ICD-10-CM | POA: Insufficient documentation

## 2014-09-17 DIAGNOSIS — F332 Major depressive disorder, recurrent severe without psychotic features: Principal | ICD-10-CM | POA: Diagnosis present

## 2014-09-17 MED ORDER — HYDROXYZINE HCL 25 MG PO TABS
25.0000 mg | ORAL_TABLET | Freq: Every evening | ORAL | Status: DC | PRN
  Administered 2014-09-20: 25 mg via ORAL
  Filled 2014-09-17: qty 14
  Filled 2014-09-17 (×2): qty 1

## 2014-09-17 MED ORDER — NICOTINE 21 MG/24HR TD PT24
21.0000 mg | MEDICATED_PATCH | Freq: Every day | TRANSDERMAL | Status: DC
  Administered 2014-09-17 – 2014-09-21 (×5): 21 mg via TRANSDERMAL
  Filled 2014-09-17 (×7): qty 1

## 2014-09-17 MED ORDER — ACETAMINOPHEN 325 MG PO TABS
650.0000 mg | ORAL_TABLET | Freq: Four times a day (QID) | ORAL | Status: DC | PRN
  Administered 2014-09-20: 650 mg via ORAL
  Filled 2014-09-17: qty 2

## 2014-09-17 MED ORDER — MAGNESIUM HYDROXIDE 400 MG/5ML PO SUSP: 30.0000 mL | Freq: Every day | ORAL | Status: DC | PRN

## 2014-09-17 MED ORDER — FINASTERIDE 5 MG PO TABS
5.0000 mg | ORAL_TABLET | Freq: Every day | ORAL | Status: DC
  Administered 2014-09-17 – 2014-09-21 (×5): 5 mg via ORAL
  Filled 2014-09-17 (×7): qty 1

## 2014-09-17 MED ORDER — ALUM & MAG HYDROXIDE-SIMETH 200-200-20 MG/5ML PO SUSP: 30.0000 mL | ORAL | Status: DC | PRN

## 2014-09-17 MED ORDER — ESTRADIOL 1 MG PO TABS
2.0000 mg | ORAL_TABLET | Freq: Three times a day (TID) | ORAL | Status: DC
  Administered 2014-09-17 – 2014-09-21 (×12): 2 mg via ORAL
  Filled 2014-09-17 (×2): qty 2
  Filled 2014-09-17: qty 1
  Filled 2014-09-17: qty 2
  Filled 2014-09-17: qty 1
  Filled 2014-09-17: qty 2
  Filled 2014-09-17: qty 1
  Filled 2014-09-17 (×2): qty 2
  Filled 2014-09-17: qty 1
  Filled 2014-09-17 (×4): qty 2
  Filled 2014-09-17: qty 1

## 2014-09-17 MED ORDER — ESCITALOPRAM OXALATE 20 MG PO TABS
30.0000 mg | ORAL_TABLET | Freq: Every day | ORAL | Status: DC
  Administered 2014-09-17 – 2014-09-18 (×2): 30 mg via ORAL
  Filled 2014-09-17 (×5): qty 1

## 2014-09-17 NOTE — Progress Notes (Signed)
Patient reports that he is having some self harm thoughts after waking up to a friend that was dead. Patient reports that were doing IV heroin the might before; patient reports that he does not normally use heroin; patient reports that he using cocaine; patient is a patient of Dr. Jannifer FranklinAkintayo and takes lexapro and abilify; patient states he has not had abilify for the last week because of the copay is too much;

## 2014-09-17 NOTE — [facility] Counselor (Signed)
Per Renata Capriceonrad, NP -  patient meets criteria for inpatient hospitalization. [facility]
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
## 2014-09-17 NOTE — Tx Team (Signed)
Initial Interdisciplinary Treatment Plan   PATIENT STRESSORS: Substance abuse Traumatic event   PATIENT STRENGTHS: Average or above average intelligence Capable of independent living General fund of knowledge   PROBLEM LIST: Problem List/Patient Goals Date to be addressed Date deferred Reason deferred Estimated date of resolution   depression 09/17/2014     " my friend died" 09/17/2014                                                DISCHARGE CRITERIA:  Improved stabilization in mood, thinking, and/or behavior Need for constant or close observation no longer present Reduction of life-threatening or endangering symptoms to within safe limits  PRELIMINARY DISCHARGE PLAN: Outpatient therapy Return to previous living arrangement  PATIENT/FAMIILY INVOLVEMENT: This treatment plan has been presented to and reviewed with the patient, Randy [name].  The patient and family have been given the opportunity to ask questions and make suggestions.  Randy [name], Randy [name] 09/17/2014, 6:03 PM

## 2014-09-18 ENCOUNTER — Encounter (HOSPITAL_COMMUNITY): Payer: Self-pay

## 2014-09-18 DIAGNOSIS — F1424 Cocaine dependence with cocaine-induced mood disorder: Secondary | ICD-10-CM | POA: Insufficient documentation

## 2014-09-18 DIAGNOSIS — F332 Major depressive disorder, recurrent severe without psychotic features: Principal | ICD-10-CM

## 2014-09-18 DIAGNOSIS — R45851 Suicidal ideations: Secondary | ICD-10-CM

## 2014-09-18 DIAGNOSIS — F1124 Opioid dependence with opioid-induced mood disorder: Secondary | ICD-10-CM

## 2014-09-18 MED ORDER — POTASSIUM CHLORIDE CRYS ER 20 MEQ PO TBCR
20.0000 meq | EXTENDED_RELEASE_TABLET | Freq: Two times a day (BID) | ORAL | Status: AC
Start: 2014-09-18 — End: 2014-09-19
  Administered 2014-09-18 – 2014-09-19 (×2): 20 meq via ORAL
  Filled 2014-09-18 (×3): qty 1

## 2014-09-18 MED ORDER — ESCITALOPRAM OXALATE 20 MG PO TABS
20.0000 mg | ORAL_TABLET | Freq: Every day | ORAL | Status: DC
  Administered 2014-09-19 – 2014-09-21 (×3): 20 mg via ORAL
  Filled 2014-09-18 (×4): qty 1

## 2014-09-18 MED ORDER — ARIPIPRAZOLE 5 MG PO TABS
5.0000 mg | ORAL_TABLET | Freq: Every day | ORAL | Status: DC
  Administered 2014-09-18 – 2014-09-21 (×4): 5 mg via ORAL
  Filled 2014-09-18 (×6): qty 1
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
## 2014-09-18 NOTE — [facility] Group Notes (Signed)
[facility]  09/18/2014 4:17 PM  Type of Therapy:  Group Therapy  Participation Level:  Did Not Attend  Wynn BankerHodnett, Lahoma Constantin Hairston 09/18/2014, 4:17 PM
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
## 2014-09-18 NOTE — Progress Notes (Signed)
At the beginning of the shift, pt had meds due which were taken to the pt who was in her room in bed.  Conversation with pt was very minimal.  He is still having passive suicidal thoughts, but contracts for safety.  She is still devastated by the sudden death of her girlfriend by overdose.  Pt did not have much to say this evening.  Support and encouragement offered to pt.  Pt had been crying when Clinical research associatewriter entered the room.  She voiced no needs or concerns.  She willingly took her meds.  Safety maintained with q15 minute checks.

## 2014-09-18 NOTE — Progress Notes (Signed)
Did not attend group 
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
## 2014-09-18 NOTE — H&P (Signed)
Psychiatric Admission Assessment Adult  Patient Identification: Randy [name] MRN:  [id number] Date of Evaluation:  09/18/2014 Chief Complaint:  "I got more depressed after my friend died in my arms."  Principal Diagnosis: MDD (major depressive disorder) Diagnosis:   Patient Active Problem List   Diagnosis Date Noted  . Opioid dependence with opioid-induced mood disorder [F11.24]   . Cocaine dependence with cocaine-induced mood disorder [F14.24]   . MDD (major depressive disorder) [F32.2] 09/17/2014  . MDD (major depressive disorder), recurrent severe, without psychosis [F33.2] 07/05/2014  . Major depression, recurrent [F33.9] 04/10/2012  . Cocaine dependence [F14.20] 04/10/2012  . Polysubstance abuse including IVDA (heroin), cocaine, marijuana [F19.10] 01/10/2012  . Elevated LFTs [R79.89] 01/10/2012  . Chronic hepatitis C without hepatic coma [B18.2] 01/10/2012  . 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 Tobacco abuse [Z72.0] 01/10/2012  . Anxiety disorder [F41.9] 08/14/2011  . Cocaine abuse [F14.10] 08/14/2011  . Recurrent major depression-severe [F33.2] 08/13/2011   History of Present Illness:  Randy [name] is a 89 year [name] to male transgendered [facility]. The patient reported suicidal thoughts with plan to overdose. She reported waking up yesterday morning to find her girlfriend dead from a heroine overdose. She reports  occasional use of heroine and cocaine. Has been using these substances since 2007. She has a [facility] with [facility] most recent [facility] 07/[facility]. Patient reports being compliant with Lexapro but has not been able to afford Abilify. She sees Dr. Jannifer FranklinAkintayo on an outpatient basis. The patient was detoxed and received substance [facility]. Currently denies any withdrawal symptoms and appears in no acute distress. Patient stated the following  during his psychiatric assessment today with Dr. Jama Flavorsobos "My girlfriend died recently from a heroine overdose. I woke up and found her that way. I was a little depressed before but now it is much worse. I have always struggled with depression. But the problem is that I was falling in love with her. I did not expect this to happen." Randy [name] appears sad and depressed throughout the assessment and endorses numerous symptoms of depression. Denies any symptoms of psychosis. Admits that she is re-experiencing finding the friend dead stating "I see her in my mind." Reports that the experience has motivated her to "never use heroine again." Patient admits to overdosing on heroine "one and a half months ago but KenyaBeth my friend called EMS and saved my life." The patient is able to contract for safety on the unit and was encouraged to participate more on the milieu. Randy [name] reports that she continued to take Estradiol but has not have any surgery to officially change her gender.   Elements:  Location:  [facility] adult unit Quality:  Feelings of hopelessness, worthlessness, anxiety and suicide Severity:  severe. Timing:  "Since Sunday when I found my friend dead."  Duration:  chronic, intermittent. Context:  Heroine use, unable to afford Abilify, sudden death of friend Associated Signs/Symptoms: Depression Symptoms:  depressed mood, anhedonia, insomnia, psychomotor retardation, fatigue, feelings of worthlessness/guilt, difficulty concentrating, recurrent thoughts of death, suicidal thoughts with specific plan, anxiety, loss of energy/fatigue, disturbed sleep, (Hypo) Manic Symptoms:  Irritable Mood, Anxiety Symptoms:  Excessive Worry, Psychotic Symptoms:  NA PTSD Symptoms: NA Total Time spent with patient: 45 minutes  Past Medical History:  Past Medical History  Diagnosis Date  . Depression   . Hepatitis C   . Hormonal imbalance in  transgender patient 01/10/2012  . Cellulitis  Of  Left Forearm 01/10/2012     From IVDA    Past Surgical History  Procedure Laterality Date  . I&d extremity  01/13/2012    Procedure: IRRIGATION AND DEBRIDEMENT EXTREMITY;  Surgeon: Kennieth Rad, MD;  Location: [facility];  Service: Orthopedics;  Laterality: Left;   Family History:  Family History  Problem Relation Age of Onset  . Idiopathic pulmonary fibrosis Father    Social History:  History  Alcohol Use  . 0.0 oz/week    Comment: used once     History  Drug Use  . Yes  . Special: Heroin, "Crack" cocaine    Comment: heroin last used 07/14/14, crack    History   Social History  . Marital Status: Single    Spouse Name: N/A  . Number of Children: N/A  . Years of Education: N/A   Occupational History  . 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 Smoking status: Current Every Day Smoker -- 0.50 packs/day for 20 years    Types: Cigarettes  . Smokeless tobacco: Never Used     Comment: trying to quit-using e-cig some  . Alcohol Use: 0.0 oz/week     Comment: used once  . Drug Use: Yes    Special: Heroin, "Crack" cocaine     Comment: heroin last used 07/14/14, crack  . Sexual Activity:    Partners: Male    Birth Control/ Protection: Condom     Comment: Has been HIV tested in past and has been negative.   Other Topics Concern  . None   Social History Narrative   Transgendered male.  Single.  Lives alone.  Independent.   Additional Social History:  Musculoskeletal: Strength & Muscle Tone: within normal limits Gait & Station: normal Patient leans: N/A  Psychiatric Specialty Exam: Physical Exam  Vitals reviewed. Constitutional: He is oriented to person, place, and time. He appears well-developed and well-nourished.  HENT:  Head: Normocephalic and atraumatic.  Right Ear: External ear normal.  Left Ear: External ear normal.  Nose: Nose normal.  Mouth/Throat: Oropharynx is clear and moist.  Eyes: Conjunctivae and EOM are normal.  Neck: Normal range of motion.  Neck supple.  Cardiovascular: Normal rate, regular rhythm, normal heart sounds and intact distal pulses.   Respiratory: Effort normal and breath sounds normal.  GI: Soft. Bowel sounds are normal.  Musculoskeletal: Normal range of motion.  Neurological: He is alert and oriented to person, place, and time. He has normal reflexes.  Skin: Skin is warm and dry.  Psychiatric: He exhibits a depressed mood.    Review of Systems  Constitutional: Negative.   HENT: Negative.   Eyes: Negative.   Respiratory: Negative.   Cardiovascular: Negative.   Gastrointestinal: Negative.   Genitourinary: Negative.   Musculoskeletal: Negative.   Skin: Negative.   Neurological: Negative.   Endo/Heme/Allergies: Negative.   Psychiatric/Behavioral: Positive for depression, suicidal ideas and substance abuse. Negative for hallucinations and memory loss. The patient is nervous/anxious and has insomnia.     Blood pressure 106/66, pulse 71, temperature 98.1 F (36.7 C), temperature source Oral, resp. rate 18, height  (1.676 m), weight 76.204 kg (168 lb).Body mass index is 27.13 kg/(m^2).  General Appearance: poorly groomed  Eye Contact::  Good  Speech:  Slow  Volume:  Decreased  Mood:  Dysphoric  Affect:  Depressed  Thought Process:  Goal Directed and Linear  Orientation:  Full (  Time, Place, and Person)  Thought Content:  Rumination  Suicidal Thoughts:  Yes.  without intent/plan  Homicidal Thoughts:  No  Memory:  Immediate;   Fair Recent;   Fair Remote;   Fair  Judgement:  Intact  Insight:  Fair  Psychomotor Activity:  Decreased  Concentration:  Fair  Recall:  [employer] of Knowledge:Fair  Language: Fair  Akathisia:  Negative  Handed:  Right  AIMS (if indicated):     Assets:  Communication Skills Desire for Improvement Resilience Social Support  ADL's:  Intact  Cognition: WNL  Sleep:      Risk to Self: Suicidal Ideation: Yes-Currently Present Suicidal Intent: Yes-Currently Present Is  patient at risk for suicide?: No Suicidal Plan?: Yes-Currently Present Specify Current Suicidal Plan: Overdose Access to Means: Yes Specify Access to Suicidal Means: Heroin and cocaine  What has been your use of drugs/alcohol within the last 12 months?: Patient reports abusing Heroin and Cocaine but unable to specify amount How many times?: 0 Other Self Harm Risks: None Reported Triggers for Past Attempts:  (NA) Intentional Self Injurious Behavior: None Risk to Others: Homicidal Ideation: No Thoughts of Harm to Others: No Current Homicidal Intent: No Current Homicidal Plan: No Access to Homicidal Means: No Identified Victim: None Reported History of harm to others?: No Assessment of Violence: None Noted Violent Behavior Description: None Reported Does patient have access to weapons?: No Criminal Charges Pending?: No Does patient have a court date: No Prior Inpatient Therapy: Prior Inpatient Therapy: Yes Prior Therapy Dates: 2016 Prior Therapy Facilty[facility]Provider[facility] Reason for Treatment: Medication Management  Prior Outpatient Therapy: Prior Outpatient Therapy: Yes Prior Therapy Dates: Ongoing  Prior Therapy Facilty/Provider(s): Dr. Jannifer Franklin Reason for Treatment: Medication Management  Does patient have an ACCT team?: No Does patient have Intensive In-House Services?  : No Does patient have [facility] services? : No Does patient have P4[facility] services?: No  Alcohol Screening: 1. How often do you have a drink containing alcohol?: Monthly or less 2. How many drinks containing alcohol do you have on a typical day when you are drinking?: 3 or 4 3. How often do you have six or more drinks on one occasion?: Never Preliminary Score: 1  Allergies:   Allergies  Allergen Reactions  . Penicillins Other (See Comments)    Swollen joints    Lab Results:  No results found for this or any previous visit (from the past 48 hour(s)). Current Medications: Current Facility-Administered  Medications  Medication Dose Route Frequency Provider Last Rate Last Dose  . acetaminophen (TYLENOL) tablet 650 mg  650 mg Oral Q6H PRN Sanjuana Kava, NP      . alum & mag hydroxide-simeth (MAALOX/MYLANTA) 200-200-20 MG/5ML suspension 30 mL  30 mL Oral Q4H PRN Sanjuana Kava, NP      . ARIPiprazole (ABILIFY) tablet 5 mg  5 mg Oral Daily Craige Cotta, MD      . Melene Muller ON 09/19/2014] escitalopram (LEXAPRO) tablet 20 mg  20 mg Oral Daily Rockey Situ Cobos, MD      . estradiol (ESTRACE) tablet 2 mg  2 mg Oral TID Sanjuana Kava, NP   2 mg at 09/18/14 1209  . finasteride (PROSCAR) tablet 5 mg  5 mg Oral Daily Sanjuana Kava, NP   5 mg at 09/18/14 0815  . hydrOXYzine (ATARAX/VISTARIL) tablet 25 mg  25 mg Oral QHS PRN Sanjuana Kava, NP      . magnesium hydroxide (MILK OF MAGNESIA) suspension 30  mL  30 mL Oral Daily PRN Sanjuana Kava, NP      . nicotine (NICODERM CQ - dosed in mg/24 hours) patch 21 mg  21 mg Transdermal Daily Sanjuana Kava, NP   21 mg at 09/18/14 1610   PTA Medications: Prescriptions prior to admission  Medication Sig Dispense Refill Last Dose  . ARIPiprazole (ABILIFY) 10 MG tablet Take 1 tablet (10 mg total) by mouth daily. 30 tablet 0 Past Week at Unknown time  . escitalopram (LEXAPRO) 10 MG tablet Take 3 tablets (30 mg total) by mouth daily. 30 tablet 0 09/15/2014  . estradiol (ESTRACE) 2 MG tablet Take 1 tablet (2 mg total) by mouth 3 (three) times daily. 90 tablet 0 Past Week at Unknown time  . finasteride (PROSCAR) 5 MG tablet Take 1 tablet (5 mg total) by mouth daily. 30 tablet 0 Past Week at Unknown time  . spironolactone (ALDACTONE) 50 MG tablet Take 50 mg by mouth daily.   Past Week at Unknown time    Previous Psychotropic Medications: Yes  Abilify  Substance Abuse History in the last 12 months:  Yes.   Reports recreational use of cocaine and heroine  Consequences of Substance Abuse: re admissions hospital and work employment disturbance  No results found for this or any  previous visit (from the past 72 hour(s)).  Observation Level/Precautions:  15 minute checks  Laboratory:  per ED, order UDS to evaluate for substances  Psychotherapy:  Group therapy  Medications:  Continue Lexapro 30 mg daily for treatment of depression, Restart Abilify 5 mg daily for mood stabilization and explore patient assistance options   Consultations:  As needed  Discharge Concerns:  Safety  Estimated LOS:  5-7 days  Other:  Increase collateral information    Psychological Evaluations: Yes   Treatment Plan Summary: Daily contact with patient to assess and evaluate symptoms and progress in treatment and Medication management  Treatment Plan/Recommendations:   1. Admit for crisis management and stabilization. Estimated length of stay 5-7 days. 2. Medication management to reduce current symptoms to base line and improve the patient's level of functioning.  3. Develop treatment plan to decrease risk of relapse upon discharge of depressive symptoms and the need for readmission. 5. Group therapy to facilitate development of healthy coping skills to use for depression and anxiety. 6. Health care follow up as needed for medical problems. Noted to have potassium level of 3.4 currently, Will order K-DUR 20 meq for two doses.  7. Discharge plan to include therapy to help patient cope with death of friend and other stressors.  8. Call for Consult with Hospitalist for additional specialty patient services as needed.   Medical Decision Making:  Review of Psycho-Social Stressors (1), Review or order clinical lab tests (1), Discuss test with performing physician (1), Review and summation of old records (2), Established Problem, Worsening (2), Review of Medication Regimen & Side Effects (2) and Review of New Medication or Change in Dosage (2)  I certify that inpatient services furnished can reasonably be expected to improve the patient's condition.   Fransisca Kaufmann NP-C 5/10/20165:00 PM   Case  discussed with NP and patient seen by me  I agree with NP Note and Assessment Patient is 44 years old . She is trans-gender and identifies self as male.  She has A history of depression, cocaine dependence, opiate ( heroin) abuse. Has had prior psychiatric admissions to our unit, most recently in early February /2016 for worsening depression, suicidal ideations.  Patient states she was doing relatively well up to recent death of a good friend. States that last weekend her best friend and her were using heroin, and that she woke up to find that her friend had passed away presumably from opiate overdose. Patient then felt severely depressed, developed suicidal ideations and requested admission. She reports anhedonia, sadness, grief about her friend's death, denies psychotic symptoms. States her opiate abuse was irregular and therefore does not feel she is at risk of withdrawal and is not endorsing any WDL symptoms at present. She states Lexapro and Abilify combination was very helpful and well tolerated, although recently had not taken Abilify due to affordability issues . Dx- MDD, Opiate Abuse, Cocaine Dependence

## 2014-09-18 NOTE — Progress Notes (Signed)
D:  Per pt self inventory pt reports sleeping ok last night but has been sleepy today throughout the day and has stayed in room in bed for most of the day, appetite ok, energy level low, ability to pay attention poor, pt reports that she is feeling depressed today, endorses Passive SI on and off, contracts for safety, denies HI/AVH presently.  A:  Emotional support provided, Encouraged pt to continue with treatment plan and attend all group activities, q15 min checks maintained for safety.  R:  Pt is calm, sleepy in bed, isolates in room, feeling depressed, not going to groups, med compliant, this evening EKG was ordered pt does not want to have EKG done until later this evening.

## 2014-09-18 NOTE — Progress Notes (Signed)
Recreation Therapy Notes  Animal-Assisted Activity (AAA) Program Checklist/Progress Notes Patient Eligibility Criteria Checklist & Daily Group note for Rec Tx Intervention  Date: 05.10.2016 Time: 2:45pm [address]    AAA/T Program Assumption of Risk Form signed by Patient/ or Parent Legal Guardian yes  Patient is free of allergies or sever asthma yes  Patient reports no fear of animals yes  Patient reports no history of cruelty to animals yes  Patient understands his/her participation is voluntary yes  Behavioral Response: Did not attend.   Randy [name], LRT/CTRS  [name] L 09/18/2014 4:03 PM

## 2014-09-18 NOTE — [facility] Group Notes (Signed)
Adult Psychoeducational Group Note  Date:  09/18/2014 Time:  0900am  Group Topic/Focus:  Orientation:   The focus of this group is to educate the patient on the purpose and policies of crisis stabilization and provide a format to answer questions about their admission.  The group details unit policies and expectations of patients while admitted.  Participation Level:  Did Not Attend  Alfonse Sprucehorne, Jodean Valade Brooke 09/18/2014, 10:44 AM

## 2014-09-19 NOTE — Progress Notes (Signed)
D: Patient denies HI and A/V hallucinations; patient reports sleep is poor; reports appetite is poor; reports energy level is low ; reports ability to concentrate is poor; rates depression as 7/10; rates hopelessness 8/10; rates anxiety as 8/10; patient reports SI thoughts off and on   A: Monitored q 15 minutes; patient encouraged to attend groups; patient educated about medications; patient given medications per physician orders; patient encouraged to express feelings and/or concerns  R: Patient is sad, flat, and blunted; patient is cooperative; patient spends a lot of time sleep; patient's interaction with staff and peers is isolative; patient was able to set goal to talk with staff 1:1 when having feelings of SI; patient is taking medications as prescribed and tolerating medications

## 2014-09-19 NOTE — Progress Notes (Signed)
[facility]  09/19/2014 7:03 PM Randy [name]  MRN:  [id number] Subjective:   Patient reports ongoing depression, sadness and feelings of grief related to recent death of friend. Reports poor appetite, poor energy level, and low motivation. Denies medication side effects. Objective : I have discussed case with treatment team. Patient remains depressed, sad, constricted in affect, and isolative, spending a lot of time in his room. He has been up for meals . Group, milieu participation has been limited . He denies any current plan or intention of hurting self and contacts for safety on the unit, agreeing to inform staff if any SI emerges. He denies medication side effects. No disruptive or agitated behaviors . He responds to support, empathy, validation , and affect does seem to improve during session.  Principal Problem: MDD (major depressive disorder) Diagnosis:   Patient Active Problem List   Diagnosis Date Noted  . Opioid dependence with opioid-induced mood disorder [F11.24]   . Cocaine dependence with cocaine-induced mood disorder [F14.24]   . MDD (major depressive disorder) [F32.2] 09/17/2014  . MDD (major depressive disorder), recurrent severe, without psychosis [F33.2] 07/05/2014  . Major depression, recurrent [F33.9] 04/10/2012  . Cocaine dependence [F14.20] 04/10/2012  . Polysubstance abuse including IVDA (heroin), cocaine, marijuana [F19.10] 01/10/2012  . Elevated LFTs [R79.89] 01/10/2012  . Chronic hepatitis C without hepatic coma [B18.2] 01/10/2012  . 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 Tobacco abuse [Z72.0] 01/10/2012  . Anxiety disorder [F41.9] 08/14/2011  . Cocaine abuse [F14.10] 08/14/2011  . Recurrent major depression-severe [F33.2] 08/13/2011   Total Time spent with patient: 25 minutes    Past Medical History:  Past Medical History  Diagnosis Date  . Depression   . Hepatitis C   . Hormonal imbalance in transgender  patient 01/10/2012  . Cellulitis  Of  Left Forearm 01/10/2012    From IVDA    Past Surgical History  Procedure Laterality Date  . I&d extremity  01/13/2012    Procedure: IRRIGATION AND DEBRIDEMENT EXTREMITY;  Surgeon: Kennieth RadArthur F Carter, MD;  Location: [facility];  Service: Orthopedics;  Laterality: Left;   Family History:  Family History  Problem Relation Age of Onset  . Idiopathic pulmonary fibrosis Father    Social History:  History  Alcohol Use  . 0.0 oz/week    Comment: used once     History  Drug Use  . Yes  . Special: Heroin, "Crack" cocaine    Comment: heroin last used 07/14/14, crack    History   Social History  . Marital Status: Single    Spouse Name: N/A  . Number of Children: N/A  . Years of Education: N/A   Occupational History  . 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 Smoking status: Current Every Day Smoker -- 0.50 packs/day for 20 years    Types: Cigarettes  . Smokeless tobacco: Never Used     Comment: trying to quit-using e-cig some  . Alcohol Use: 0.0 oz/week     Comment: used once  . Drug Use: Yes    Special: Heroin, "Crack" cocaine     Comment: heroin last used 07/14/14, crack  . Sexual Activity:    Partners: Male    Birth Control/ Protection: Condom     Comment: Has been HIV tested in past and has been negative.   Other Topics Concern  . None   Social History Narrative   Transgendered male.  Single.  Lives alone.  Independent.   Additional History:    Sleep:fair   Appetite:  Fair    Assessment:   Musculoskeletal: Strength & Muscle Tone: within normal limits Gait & Station: normal Patient leans: N/A   Psychiatric Specialty Exam: Physical Exam  ROS- poor energy level, fair appetite. Denies chest pain, shortness of breath.  No diarrhea, no vomiting, some nausea.  Blood pressure 118/84, pulse 94, temperature 98.5 F (36.9 C), temperature source Oral, resp. rate 20, height 5\' 6"  (1.676 m), weight 168 lb  (76.204 kg).Body mass index is 27.13 kg/(m^2).  General Appearance: Fairly Groomed  Patent attorneyye Contact::  Fair  Speech:  Slow  Volume:  Decreased  Mood:  Depressed  Affect:  Congruent and Constricted- although does smile briefly at times today  Thought Process:  Goal Directed and Linear  Orientation:  Full (Time, Place, and Person)  Thought Content:  denies hallucinations and does not appear internally preoccupied   Suicidal Thoughts:  No at this time denies any thoughts of hurting self and  contracts for safety on unit   Homicidal Thoughts:  No  Memory:  recent and remote grossly intact   Judgement:  Fair  Insight:  Fair  Psychomotor Activity:  Decreased  Concentration:  Good  Recall:  Good  Fund of Knowledge:Good  Language: Good  Akathisia:  Negative  Handed:  Right  AIMS (if indicated):     Assets:  Desire for Improvement Resilience  ADL's:  Impaired  Cognition: WNL  Sleep:        Current Medications: Current Facility-Administered Medications  Medication Dose Route Frequency Provider Last Rate Last Dose  . acetaminophen (TYLENOL) tablet 650 mg  650 mg Oral Q6H PRN Sanjuana KavaAgnes I Nwoko, NP      . alum & mag hydroxide-simeth (MAALOX/MYLANTA) 200-200-20 MG/5ML suspension 30 mL  30 mL Oral Q4H PRN Sanjuana KavaAgnes I Nwoko, NP      . ARIPiprazole (ABILIFY) tablet 5 mg  5 mg Oral Daily Craige CottaFernando A Shizue Kaseman, MD   5 mg at 09/19/14 0751  . escitalopram (LEXAPRO) tablet 20 mg  20 mg Oral Daily Craige CottaFernando A Takira Sherrin, MD   20 mg at 09/19/14 0749  . estradiol (ESTRACE) tablet 2 mg  2 mg Oral TID Sanjuana KavaAgnes I Nwoko, NP   2 mg at 09/19/14 1728  . finasteride (PROSCAR) tablet 5 mg  5 mg Oral Daily Sanjuana KavaAgnes I Nwoko, NP   5 mg at 09/19/14 0751  . hydrOXYzine (ATARAX/VISTARIL) tablet 25 mg  25 mg Oral QHS PRN Sanjuana KavaAgnes I Nwoko, NP      . magnesium hydroxide (MILK OF MAGNESIA) suspension 30 mL  30 mL Oral Daily PRN Sanjuana KavaAgnes I Nwoko, NP      . nicotine (NICODERM CQ - dosed in mg/24 hours) patch 21 mg  21 mg Transdermal Daily Sanjuana KavaAgnes I Nwoko,  NP   21 mg at 09/19/14 0750    Lab Results: No results found for this or any previous visit (from the past 48 hour(s)).  Physical Findings: AIMS: Facial and Oral Movements Muscles of Facial Expression: None, normal Lips and Perioral Area: None, normal Jaw: None, normal Tongue: None, normal,Extremity Movements Upper (arms, wrists, hands, fingers): None, normal Lower (legs, knees, ankles, toes): None, normal, Trunk Movements Neck, shoulders, hips: None, normal, Overall Severity Severity of abnormal movements (highest score from questions above): None, normal Incapacitation due to abnormal movements: None, normal Patient's awareness of abnormal movements (rate only patient's report): No Awareness, Dental Status Current problems with teeth and/or dentures?: No  Does patient usually wear dentures?: No  CIWA:    COWS:      Assessment- patient remains depressed, sad, and continues to have acute grief symptoms regarding recent death of friend from an opiate overdose. He denies suicidal ideations and has had no self injurious behaviors, he is not psychotic. He is tolerating medications well.    Treatment Plan Summary: Daily contact with patient to assess and evaluate symptoms and progress in treatment, Medication management, Plan continue inpatient psychiatric treatment and continue medications as below  Abilify 5 mgrs QDAY  For depression/mood disorder  Lexapro 20 mgrs QDAY  For depression/mood disorder  Have requested EKG - currently pending - to rule out QTc prolongation.  Ongoing support, empathy, regarding grief/loss. Ongoing counseling regarding maintaining sobriety/relapse prevention. On Estrace ( Estrogen supplement ) for history of  Trans-gender management.    Medical Decision Making:  Established Problem, Stable/Improving (1), Review of Psycho-Social Stressors (1), Review or order clinical lab tests (1) and Review of Medication Regimen & Side Effects (2)     Kymberly Blomberg,  Lam Mccubbins 09/19/2014, 7:03 PM

## 2014-09-19 NOTE — Progress Notes (Signed)
D: Pt presents depressed in affect and mood. Pt is currently denying any SI/HI/AVH. Pt reports some improvement in his overall mood. Pt verbalizes an increase in his visibility within the milieu. However, pt remained in his room this evening. Pt denied the need for any food or fluids with Clinical research associatewriter. Pt informed of sleep being available as needed. A: Continued support and availability as needed was extended to this pt. Staff continue to monitor pt with q4315min checks.  R: Pt receptive to treatment. Pt remains safe at this time.

## 2014-09-19 NOTE — [facility] Group Notes (Signed)
[facility]  09/19/2014 2:55 PM  Type of Therapy:  Group Therapy  Participation Level:  Did Not Attend  Wynn BankerHodnett, Cloud Graham Hairston 09/19/2014, 2:55 PM

## 2014-09-19 NOTE — [facility] Group Notes (Signed)
[facility]   09/19/2014 1:02 PM    Participation Quality:  Appropraite  Mood/Affect:  Appropriate  Depression Rating:  7  Anxiety Rating:  7  Thoughts of Suicide:  Yes  Will you contract for safety? Yes  Current AVH:  No  Plan for Discharge/Comments:  Patient attended discharge planning group and actively participated in group. She reports feeling a "tad better" today but continues to endorse SI.  she will follow up with [facility] for outpatient services.  Suicide prevention education reviewed and SPE document provided.   Transportation Means: Patient has transportation.   Supports:  Patient has a support system.   Christophor Eick Hairston   \

## 2014-09-19 NOTE — Plan of Care (Signed)
Problem: Ineffective individual coping Goal: LTG: Patient will report a decrease in negative feelings Outcome: Progressing Pt reports no improvement in his current mood. Pt was isolated to his room this evening.

## 2014-09-19 NOTE — [facility] Suicide Risk Assessment (Signed)
[facility] INPATIENT:  Family/Significant Other Suicide Prevention Education  Suicide Prevention Education:  Patient Refusal for Family/Significant Other Suicide Prevention Education: The patient Randy [name] has refused to provide written consent for family/significant other to be provided Family/Significant Other Suicide Prevention Education during admission and/or prior to discharge.  Physician notified.  Wynn BankerHodnett, Vaniyah Lansky Hairston 09/19/2014, 1:07 PM

## 2014-09-19 NOTE — Progress Notes (Signed)
Recreation Therapy Notes  Date: 09/19/14 Time: 9:30am Location: [address] Group Room  Group Topic: Stress Management  Goal Area(s) Addresses:  Patient will actively participate in stress management techniques presented during session.   Behavioral Response:  Did not attend.    Caroll RancherMarjette Carliyah Cotterman, LRT/CTRS         Lillia AbedLindsay, Luciann Gossett A 09/19/2014 3:12 PM

## 2014-09-19 NOTE — Progress Notes (Addendum)
D: Pt presents depressed in affect and mood. Pt remained in her room this evening. Pt continues to endorse SI. Denied having a plan. Pt verbally contracts for safety. Pt did not engage in conversation with Clinical research associatewriter. " I'm not doing good". Pt denied any physical concerns. Pt had no questions he wished for this writer to address at this time. Pt declined to take her ekg at this time. Writer instructed pt to seek writer once she was willing to have her ekg completed.   A: Continued support and availability as needed was extended to this pt. Staff continue to monitor pt with q6215min checks.  R:  Pt is not actively participating within the milieu. Marland Kitchen. Pt verbally contracts for safety. Pt remains safe at this time.

## 2014-09-20 LAB — RAPID URINE DRUG SCREEN, HOSP PERFORMED
AMPHETAMINES: NOT DETECTED
BARBITURATES: NOT DETECTED
BENZODIAZEPINES: NOT DETECTED
COCAINE: POSITIVE — AB
Opiates: POSITIVE — AB
Tetrahydrocannabinol: NOT DETECTED

## 2014-09-20 NOTE — Progress Notes (Signed)
Patient ID: Randy [name], male   DOB: October 14, 1970, 44 y.o.   MRN: [id number]  Adult Psychoeducational Group Note  Date:  09/20/2014 Time:  09:30am  Group Topic/Focus:  Self Care:   The focus of this group is to help patients understand the importance of self-care in order to improve or restore emotional, physical, spiritual, interpersonal, and financial health.  Participation Level:  Active  Participation Quality:  Appropriate  Affect:  Flat  Cognitive:  Alert and Oriented  Insight: Lacking  Engagement in Group: Minimal  Modes of Intervention:  Activity, Discussion, Education and Support  Additional Comments:  Pt able to identify one daily goal to accomplish today. Pt engaged in practicing positive self care techniques during group.   [name], [name] 09/20/2014, 10:07 AM

## 2014-09-20 NOTE — Progress Notes (Signed)
Patient ID: Randy [name], male   DOB: 03-04-1971, 44 y.o.   MRN: [id number] Pt currently presents with a flat affect and depressed behavior. Pt forwards little to staff. Pt states "I'm okay. I may be going home tomorrow." Per self inventory, pt rates depression at a 6, hopelessness 6 and anxiety 5. Pt smiles and shakes his head in a "no" fashion when asked about his daily goal. Pt reports poor sleep, poor concentration and a poor appetite.  Pt provided with medications per providers orders. Pt's labs and vitals were monitored throughout the day. Pt supported emotionally and encouraged to express concerns and questions. Pt educated on medications. Pt given a 1:1 on stressors and positive supports systems.  Pt's safety ensured with 15 minute and environmental checks. Pt currently denies SI/HI and A/V hallucinations. Pt verbally agrees to seek staff if SI/HI or A/VH occurs and to consult with staff before acting on these thoughts.

## 2014-09-20 NOTE — Progress Notes (Signed)
Pt awoke with a headache and asked the writer for tylenol. When asked about his day pt reported, "not good", however would not go into detail. When asked aobut his discussion with his Dr pt stated, "he wants me to have an EKG". Writer informed pt that it was reported he refused twice. However, pt stated that he plans to let the nurse perform the EKG today.  A:  Support and encouragement was offered. 15 min checks continued for safety.  R: Pt remains safe.

## 2014-09-20 NOTE — Clinical Social Work Note (Signed)
CSW met with patient who advised she talked with her landlord and can return to the apartment.  Patient stated it would be difficult but bette than being on the street.  Patient became visibly shaken when asked about how he is handling death of friend.  She stated she is doing okay.  Patient advised of appointment with outpatient MD and that they are requesting payment on outstanding bill when he comes for the appointment.

## 2014-09-20 NOTE — Progress Notes (Signed)
Patient ID: Randy [name], male   DOB: January 05, 1971, 44 y.o.   MRN: [id number] [facility] MD Progress Note  09/20/2014 3:21 PM Randy [name]  MRN:  [id number] Subjective:    Patient reports that today he feels " a little better" , and is asking when  He is going to be discharged. He denies medication side effects. Objective : I have discussed case with treatment team. As reported by staff has been somewhat more visible on unit, going to some groups, although still interacting little with staff/peers. He is polite and calm upon approach. He is tolerating medications well and feels they are helping . He continues to ruminate about the recent death of his friend, but reports improvement of the intrusive visual recollections associated with her death, and at present reports a sense of sadness and loss but not of Acute Stress Disorder, or of flashbacks . We discussed substance abuse issues - patient states he had been using heroin " here and there" but not daily, and has not developed any significant opiate WDL symptoms. He denies significant cravings and states he will no access to drugs after discharge. Encouraged to remain focused on recovery, relapse prevention and to avoid people, situations he associates with drug use . Today still depressed but clearly improved compared to admission- more verbal, better eye contact, more reactive affect, more future oriented . No disruptive or agitated behaviors .  Principal Problem: MDD (major depressive disorder) Diagnosis:   Patient Active Problem List   Diagnosis Date Noted  . Opioid dependence with opioid-induced mood disorder [F11.24]   . Cocaine dependence with cocaine-induced mood disorder [F14.24]   . MDD (major depressive disorder) [F32.2] 09/17/2014  . MDD (major depressive disorder), recurrent severe, without psychosis [F33.2] 07/05/2014  . Major depression, recurrent [F33.9] 04/10/2012  . Cocaine dependence [F14.20] 04/10/2012  . Polysubstance abuse including  IVDA (heroin), cocaine, marijuana [F19.10] 01/10/2012  . Elevated LFTs [R79.89] 01/10/2012  . Chronic hepatitis C without hepatic coma [B18.2] 01/10/2012  . 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 Tobacco abuse [Z72.0] 01/10/2012  . Anxiety disorder [F41.9] 08/14/2011  . Cocaine abuse [F14.10] 08/14/2011  . Recurrent major depression-severe [F33.2] 08/13/2011   Total Time spent with patient: 25 minutes    Past Medical History:  Past Medical History  Diagnosis Date  . Depression   . Hepatitis C   . 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 Cellulitis  Of  Left Forearm 01/10/2012    From IVDA    Past Surgical History  Procedure Laterality Date  . I&d extremity  01/13/2012    Procedure: IRRIGATION AND DEBRIDEMENT EXTREMITY;  Surgeon: Kennieth Rad, MD;  Location: [facility];  Service: Orthopedics;  Laterality: Left;   Family History:  Family History  Problem Relation Age of Onset  . Idiopathic pulmonary fibrosis Father    Social History:  History  Alcohol Use  . 0.0 oz/week    Comment: used once     History  Drug Use  . Yes  . Special: Heroin, "Crack" cocaine    Comment: heroin last used 07/14/14, crack    History   Social History  . Marital Status: Single    Spouse Name: N/A  . Number of Children: N/A  . Years of Education: N/A   Occupational History  . 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 Smoking status: Current Every Day Smoker -- 0.50 packs/day for 20 years  Types: Cigarettes  . Smokeless tobacco: Never Used     Comment: trying to quit-using e-cig some  . Alcohol Use: 0.0 oz/week     Comment: used once  . Drug Use: Yes    Special: Heroin, "Crack" cocaine     Comment: heroin last used 07/14/14, crack  . Sexual Activity:    Partners: Male    Birth Control/ Protection: Condom     Comment: Has been HIV tested in past and has been negative.   Other Topics Concern  . None   Social  History Narrative   Transgendered male.  Single.  Lives alone.  Independent.   Additional History:    Sleep:fair - but improving   Appetite:  Fair    Assessment:   Musculoskeletal: Strength & Muscle Tone: within normal limits Gait & Station: normal Patient leans: N/A   Psychiatric Specialty Exam: Physical Exam  ROS-  No rhinorrhea, no watery eyes, no yawning , no abdominal distress or diarrhea, no significant myalgias ( ie: no current significant /severe opiate WDL symptoms)   Blood pressure 113/82, pulse 89, temperature 98.8 F (37.1 C), temperature source Oral, resp. rate 16, height 5\' 6"  (1.676 m), weight 168 lb (76.204 kg).Body mass index is 27.13 kg/(m^2).  General Appearance: improved grooming   Eye Contact::  Improved   Speech:  Improved   Volume:  Decreased  Mood:   Less depressed, more reactive affect   Affect:   Constricted but more reactive   Thought Process:  Goal Directed and Linear  Orientation:  Full (Time, Place, and Person)  Thought Content:  denies hallucinations and does not appear internally preoccupied   Suicidal Thoughts:  No at this time denies any thoughts of hurting self and  contracts for safety on unit   Homicidal Thoughts:  No  Memory:  recent and remote grossly intact   Judgement:  Fair  Insight:  Fair  Psychomotor Activity:  Gradually improving, more visible on unit   Concentration:  Good  Recall:  Good  Fund of Knowledge:Good  Language: Good  Akathisia:  Negative  Handed:  Right  AIMS (if indicated):     Assets:  Desire for Improvement Resilience  ADL's:  Impaired  Cognition: WNL  Sleep:  Number of Hours: 5     Current Medications: Current Facility-Administered Medications  Medication Dose Route Frequency Provider Last Rate Last Dose  . acetaminophen (TYLENOL) tablet 650 mg  650 mg Oral Q6H PRN Sanjuana KavaAgnes I Nwoko, NP   650 mg at 09/20/14 0304  . alum & mag hydroxide-simeth (MAALOX/MYLANTA) 200-200-20 MG/5ML suspension 30 mL  30 mL  Oral Q4H PRN Sanjuana KavaAgnes I Nwoko, NP      . ARIPiprazole (ABILIFY) tablet 5 mg  5 mg Oral Daily Craige CottaFernando A Cobos, MD   5 mg at 09/20/14 0758  . escitalopram (LEXAPRO) tablet 20 mg  20 mg Oral Daily Craige CottaFernando A Cobos, MD   20 mg at 09/20/14 0758  . estradiol (ESTRACE) tablet 2 mg  2 mg Oral TID Sanjuana KavaAgnes I Nwoko, NP   2 mg at 09/20/14 1211  . finasteride (PROSCAR) tablet 5 mg  5 mg Oral Daily Sanjuana KavaAgnes I Nwoko, NP   5 mg at 09/20/14 0758  . hydrOXYzine (ATARAX/VISTARIL) tablet 25 mg  25 mg Oral QHS PRN Sanjuana KavaAgnes I Nwoko, NP      . magnesium hydroxide (MILK OF MAGNESIA) suspension 30 mL  30 mL Oral Daily PRN Sanjuana KavaAgnes I Nwoko, NP      . nicotine (NICODERM  CQ - dosed in mg/24 hours) patch 21 mg  21 mg Transdermal Daily Sanjuana KavaAgnes I Nwoko, NP   21 mg at 09/20/14 [id number]    Lab Results: No results found for this or any previous visit (from the past 48 hour(s)).  Physical Findings: AIMS: Facial and Oral Movements Muscles of Facial Expression: None, normal Lips and Perioral Area: None, normal Jaw: None, normal Tongue: None, normal,Extremity Movements Upper (arms, wrists, hands, fingers): None, normal Lower (legs, knees, ankles, toes): None, normal, Trunk Movements Neck, shoulders, hips: None, normal, Overall Severity Severity of abnormal movements (highest score from questions above): None, normal Incapacitation due to abnormal movements: None, normal Patient's awareness of abnormal movements (rate only patient's report): No Awareness, Dental Status Current problems with teeth and/or dentures?: No Does patient usually wear dentures?: No  CIWA:    COWS:      Assessment- patient's depression is gradually improving, although not yet euthymic or normal. Less withdrawn, less tearful, more communicative, improved range of affect. Tolerating medications well without side effects. No current opiate Withdrawal symptoms.     Treatment Plan Summary: Daily contact with patient to assess and evaluate symptoms and progress in  treatment, Medication management, Plan continue inpatient psychiatric treatment and continue medications as below  Abilify 5 mgrs QDAY  For depression/mood disorder  Lexapro 20 mgrs QDAY  For depression/mood disorder  Have requested EKG - currently pending - to rule out QTc prolongation. As discussed with Nursing staff patient had refused- I spoke with patient about importance of having this done and he agreed .  Continue to work on encouraging abstinence, maintaining sobriety after discharge. Encourage 12 step program participation. On Nicoderm patch for smoking cessation. On Estrace ( Estrogen supplement ) for history of  Trans-gender management. States he has a PCP in [place] who manages this as outpatient. Possible discharge tomorrow if continues to improve .   Medical Decision Making:  Established Problem, Stable/Improving (1), Review of Psycho-Social Stressors (1), Review or order clinical lab tests (1) and Review of Medication Regimen & Side Effects (2)     COBOS, FERNANDO 09/20/2014, 3:21 PM

## 2014-09-21 ENCOUNTER — Encounter (HOSPITAL_COMMUNITY): Payer: Self-pay | Admitting: Registered Nurse

## 2014-09-21 MED ORDER — HYDROXYZINE HCL 25 MG PO TABS: 25.0000 mg | ORAL_TABLET | Freq: Every evening | ORAL | Status: DC | PRN

## 2014-09-21 MED ORDER — ARIPIPRAZOLE 5 MG PO TABS: 5.0000 mg | ORAL_TABLET | Freq: Every day | ORAL | Status: DC

## 2014-09-21 MED ORDER — ESCITALOPRAM OXALATE 20 MG PO TABS: 20.0000 mg | ORAL_TABLET | Freq: Every day | ORAL | Status: DC

## 2014-09-21 NOTE — Progress Notes (Signed)
Recreation Therapy Notes  Date: 09/21/14 Time: 9:30am Location: [address] Group Room  Group Topic: Stress Management  Goal Area(s) Addresses:  Patient will actively participate in stress management techniques presented during session.   Intervention: Stress management techniques  Activity: Guided Imagery. LRT provided instruction and demonstration for Guided Imagery.   Education: Stress Management, Discharge Planning.   Clinical Observations/Feedback: Patient did not attend group.   Caroll RancherMarjette Nahsir Venezia, LRT/CTRS         Caroll RancherLindsay, Immanuel Fedak A 09/21/2014 3:40 PM

## 2014-09-21 NOTE — Progress Notes (Signed)
Patient ID: Randy [name], male   DOB: 05/05/1971, 44 y.o.   MRN: [id number]  Pt discharged home with a buss pass. Pt was stable and appreciative at the time of discharge. All papers and prescriptions were given and valuables returned. Verbal understanding expressed. Denies SI/HI and A/VH. Pt given opportunity to express concerns and ask questions.

## 2014-09-21 NOTE — Progress Notes (Signed)
  [facility] :  Will you be returning to the same living situation after discharge:  Yes,  Patient is returing to her apartment. At discharge, do you have transportation home?: Yes,  Patient assisted with bus passes. Do you have the ability to pay for your medications: Yes,  Paitent is able to obtain medications.  Release of information consent forms completed and in the chart;  Patient's signature needed at discharge.  Patient to Follow up at: Follow-up Information    Follow up with        [facility] On 10/15/2014.   Why:  Monday, October 15, 2014 at 12:30 PM   Contact information:   [address] [place], [place]   [address]  [phone]      Patient denies SI/HI: Patient no longer endorsing SI/HI or other thoughts of self harm.     Safety Planning and Suicide Prevention discussed:.Reviewed with all patients during discharge planning group    Has patient been referred to the Quitline?:  Patient declined referral to Quitline.   Wynn BankerHodnett, Shanielle Correll Hairston 09/21/2014, 10:25 AM

## 2014-09-21 NOTE — Tx Team (Addendum)
Interdisciplinary Treatment Plan Update (Adult)  Date:  09/21/2014  Time Reviewed:  10:28 AM   Progress in Treatment: Attending groups: Patient is attending groups. Participating in groups:  Patient engages in discussion Taking medication as prescribed:  Patient is taking medications Tolerating medication:  Patient is tolerating medications Family/Significant othe contact made:   No, patient declined collateral contact. Patient understands diagnosis:Yes, patient understands diagnosis and need for treatment Discussing patient identified problems/goals with staff:  Yes, patient is able to express goals/problems Medical problems stabilized or resolved:  Yes Denies suicidal/homicidal ideation:  Yes Issues/concerns per patient self-inventory:   Other:  Discharge Plan or Barriers: Patient is uncertain where she will live at discharge.  Patient is followed by [facility] for outpatient services.  Reason for Continuation of Hospitalization:  Comments:   Additional comments:  Patient and CSW reviewed Patient Discharge Process Letter/Patient Involvement Form.  Patient verbalized understanding and signed form.  Patient and CSW also reviewed and identified patient's goals and treatment plan.  Patient verbalized understanding and agreed to plan.  Estimated length of stay:  Discharge today  New goal(s):  Review of initial/current patient goals per problem list:     Attendees: Patient 09/21/2014 10:28 AM   Family:   09/21/2014 10:28 AM   Physician:  Kathie RhodesS. Eappen, MD 09/21/2014 10:28 AM   Nursing:    09/21/2014 10:28 AM   Clinical Social Worker:  Juline PatchQuylle Breckan Cafiero, LCSW 09/21/2014 10:28 AM   Clinical Social Worker:  Belenda CruiseKristin Drinkard, LCSW-A 09/21/2014 10:28 AM   Case Manager:  Onnie BoerJennifer Clark, RN 09/21/2014 10:28 AM   Other:   Joslyn Devonaroline Beaudry, RN 09/21/2014 10:28 AM  Other: Ludger NuttingSara Twyman,RN 09/21/2014  10:28 AM   Other:  09/21/2014 10:28 AM   Other:  09/21/2014 10:28 AM   Other:   09/21/2014 10:28 AM   Other:   09/21/2014 10:28 AM   Other:   09/21/2014 10:28 AM   Other:  09/21/2014 10:28 AM   Other:   09/21/2014 10:28 AM    Scribe for Treatment Team:   Wynn BankerHodnett, Dimitria Ketchum Hairston, 09/21/2014   10:28 AM

## 2014-09-21 NOTE — Progress Notes (Signed)
Patient ID: Randy [name], male   DOB: 1971-01-16, 44 y.o.   MRN: [id number]  Pt currently presents with a flat affect and cooperative/pleasant behavior. Per self inventory, pt rates depression, hopelessness and anxiety at a 4. Pt's daily goal is to "go home." Pt reports good sleep,  concentration and appetite.   Pt provided with medications per providers orders. Pt's labs and vitals were monitored throughout the day. Pt supported emotionally and encouraged to express concerns and questions. Pt educated on medications and support groups.  Pt's safety ensured with 15 minute and environmental checks. Pt currently denies SI/HI and A/V hallucinations. Pt verbally agrees to seek staff if SI/HI or A/VH occurs and to consult with staff before acting on these thoughts. Pt to be discharged today. Pt states "I'm ready to go back to my apartment."

## 2014-09-21 NOTE — Progress Notes (Signed)
Recreation Therapy Notes  Date: 09/20/14 Time: 2:30pm [address]   AAA/T Program Assumption of Risk Form signed by Patient/ or Parent Legal Guardian YES   Patient is free of allergies or sever asthma YES   Patient reports no fear of animals YES  Patient reports no history of cruelty to animals YES  Patient understands his/her participation is voluntary YES  Patient washes hands before animal contact YES  Patient washes hands after animal contact YES  Behavioral Response: Appropriate, attentive  Education:Hand Washing, Appropriate Animal Interaction   Education Outcome: Acknowledges understanding/In group clarification offered   Clinical Observations/Feedback: Patient arrived for the last 20 minutes of group.  Patient pet the dog and was attentive to speaker.   Caroll RancherMarjette Alexzavier Girardin, LRT/CTRS          Caroll RancherLindsay, Brookie Wayment A 09/21/2014 8:31 AM

## 2014-09-21 NOTE — [facility] Group Notes (Signed)
[facility] Note   09/21/2014 10:23 AM    Participation Quality:  Appropraite  Mood/Affect:  Appropriate  Depression Rating:  4  Anxiety Rating:  4  Thoughts of Suicide:  No  Will you contract for safety?   NA  Current AVH:  No  Plan for Discharge/Comments:  Patient attended discharge planning group and actively participated in group. Patient reports doing much better and being ready to discharge home today.  She reports systems are very near her baseline of two.  Suicide prevention education reviewed and SPE document provided.   Transportation Means: Patient uses public  transportation.   Supports:  Patient has a limited support system.   Laquandra Carrillo, Joesph JulyQuylle Hairston

## 2014-09-21 NOTE — Discharge Summary (Signed)
Physician Discharge Summary Note  Patient:  Randy [name] is an 44 y.o., male MRN:  [id number] DOB:  [birth year] Patient phone:  [phone] (home)  Patient address:   [address] [place] [address],  Total Time spent with patient: Greater than 30 minutes  Date of Admission:  09/17/2014 Date of Discharge: 09/21/2014  Reason for Admission:  Per H&P admission: Randy [name] is a 51 year [name] to male transgendered who presented to [facility] as a walk in. The patient reported suicidal thoughts with plan to overdose. She reported waking up yesterday morning to find her girlfriend dead from a heroine overdose. She reports occasional use of heroine and cocaine. Has been using these substances since 2007. She has a history of several psychiatric hospital admissions here at [facility] with the most recent being 07/04/14. Patient reports being compliant with Lexapro but has not been able to afford Abilify. She sees Dr. Jannifer Franklin on an outpatient basis. The patient was detoxed and received substance abuse treatment at [facility] last year. Currently denies any withdrawal symptoms and appears in no acute distress. Patient stated the following during his psychiatric assessment today with Dr. Jama Flavors "My girlfriend died recently from a heroine overdose. I woke up and found her that way. I was a little depressed before but now it is much worse. I have always struggled with depression. But the problem is that I was falling in love with her. I did not expect this to happen." Ibraheem appears sad and depressed throughout the assessment and endorses numerous symptoms of depression. Denies any symptoms of psychosis. Admits that she is re-experiencing finding the friend dead stating "I see her in my mind." Reports that the experience has motivated her to "never use heroine again." Patient admits to overdosing on heroine "one and a half months ago but [place] my friend called EMS and saved my life." The patient is able to contract for safety  on the unit and was encouraged to participate more on the milieu. Agastya reports that she continued to take Estradiol but has not have any surgery to officially change her gender.    Principal Problem: MDD (major depressive disorder) Discharge Diagnoses: Patient Active Problem List   Diagnosis Date Noted  . Opioid dependence with opioid-induced mood disorder [F11.24]   . Cocaine dependence with cocaine-induced mood disorder [F14.24]   . MDD (major depressive disorder) [F32.2] 09/17/2014  . MDD (major depressive disorder), recurrent severe, without psychosis [F33.2] 07/05/2014  . Major depression, recurrent [F33.9] 04/10/2012  . Cocaine dependence [F14.20] 04/10/2012  . Polysubstance abuse including IVDA (heroin), cocaine, marijuana [F19.10] 01/10/2012  . Elevated LFTs [R79.89] 01/10/2012  . Chronic hepatitis C without hepatic coma [B18.2] 01/10/2012  . 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 Tobacco abuse [Z72.0] 01/10/2012  . Anxiety disorder [F41.9] 08/14/2011  . Cocaine abuse [F14.10] 08/14/2011  . Recurrent major depression-severe [F33.2] 08/13/2011    Musculoskeletal: Strength & Muscle Tone: within normal limits Gait & Station: normal Patient leans: N/A  Psychiatric Specialty Exam:  See Suicide Risk Assessment Physical Exam  Nursing note and vitals reviewed. Constitutional: He is oriented to person, place, and time.  Neck: Normal range of motion.  Respiratory: Effort normal.  Musculoskeletal: Normal range of motion.  Neurological: He is alert and oriented to person, place, and time.    Review of Systems  Psychiatric/Behavioral: Positive for substance abuse. Negative for suicidal ideas, hallucinations and memory loss. Depression: Stable. Nervous/anxious: Stable. Insomnia: Stable.   All other systems  reviewed and are negative.   Blood pressure 115/88, pulse 95, temperature 99.2 F (37.3 C), temperature source Oral, resp. rate 18, height   (1.676 m), weight 76.204 kg (168 lb).Body mass index is 27.13 kg/(m^2).     Has this patient used any form of tobacco in the last 30 days? (Cigarettes, Smokeless Tobacco, Cigars, and/or Pipes) Yes, A prescription for an FDA-approved tobacco cessation medication was offered at discharge and the patient refused  Past Medical History:  Past Medical History  Diagnosis Date  . Depression   . Hepatitis C   . 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 Cellulitis  Of  Left Forearm 01/10/2012    From IVDA    Past Surgical History  Procedure Laterality Date  . I&d extremity  01/13/2012    Procedure: IRRIGATION AND DEBRIDEMENT EXTREMITY;  Surgeon: Kennieth Rad, MD;  Location: [facility] ORS;  Service: Orthopedics;  Laterality: Left;   Family History:  Family History  Problem Relation Age of Onset  . Idiopathic pulmonary fibrosis Father    Social History:  History  Alcohol Use  . 0.0 oz/week    Comment: used once     History  Drug Use  . Yes  . Special: Heroin, "Crack" cocaine    Comment: heroin last used 07/14/14, crack    History   Social History  . Marital Status: Single    Spouse Name: N/A  . Number of Children: N/A  . Years of Education: N/A   Occupational History  . 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 Smoking status: Current Every Day Smoker -- 0.50 packs/day for 20 years    Types: Cigarettes  . Smokeless tobacco: Never Used     Comment: trying to quit-using e-cig some  . Alcohol Use: 0.0 oz/week     Comment: used once  . Drug Use: Yes    Special: Heroin, "Crack" cocaine     Comment: heroin last used 07/14/14, crack  . Sexual Activity:    Partners: Male    Birth Control/ Protection: Condom     Comment: Has been HIV tested in past and has been negative.   Other Topics Concern  . None   Social History Narrative   Transgendered male.  Single.  Lives alone.  Independent.   Risk to Self: Suicidal Ideation: Yes-Currently  Present Suicidal Intent: Yes-Currently Present Is patient at risk for suicide?: No Suicidal Plan?: Yes-Currently Present Specify Current Suicidal Plan: Overdose Access to Means: Yes Specify Access to Suicidal Means: Heroin and cocaine  What has been your use of drugs/alcohol within the last 12 months?: Patient reports abusing Heroin and Cocaine but unable to specify amount How many times?: 0 Other Self Harm Risks: None Reported Triggers for Past Attempts:  (NA) Intentional Self Injurious Behavior: None Risk to Others: Homicidal Ideation: No Thoughts of Harm to Others: No Current Homicidal Intent: No Current Homicidal Plan: No Access to Homicidal Means: No Identified Victim: None Reported History of harm to others?: No Assessment of Violence: None Noted Violent Behavior Description: None Reported Does patient have access to weapons?: No Criminal Charges Pending?: No Does patient have a court date: No Prior Inpatient Therapy: Prior Inpatient Therapy: Yes Prior Therapy Dates: 2016 Prior Therapy Facilty/Provider(s): [facility] Reason for Treatment: Medication Management  Prior Outpatient Therapy: Prior Outpatient Therapy: Yes Prior Therapy Dates: Ongoing  Prior Therapy Facilty/Provider(s): Dr. Jannifer Franklin Reason for Treatment: Medication Management  Does patient have an ACCT  team?: No Does patient have Intensive In-House Services?  : No Does patient have [facility] services? : No Does patient have P4CC services?: No  Level of Care:  OP  Hospital Course:  Maxwell Caulmily Espy was admitted for MDD (major depressive disorder) and crisis management.  She was treated discharged with the medications listed below under Medication List.  Medical problems were identified and treated as needed.  Home medications were restarted as appropriate.  Improvement was monitored by observation and Maxwell CaulEmily Marlowe daily report of symptom reduction.  Emotional and mental status was monitored by daily self-inventory reports  completed by Maxwell CaulEmily Mcelwee and clinical staff.         Maxwell Caulmily Connolly was evaluated by the treatment team for stability and plans for continued recovery upon discharge.  Maxwell Caulmily Corsetti motivation was an integral factor for scheduling further treatment.  Employment, transportation, bed availability, health status, family support, and any pending legal issues were also considered during her hospital stay.  She was offered further treatment options upon discharge including but not limited to Residential, Intensive Outpatient, and Outpatient treatment.  Maxwell Caulmily Anding will follow up with the services as listed below under Follow Up Information.     Upon completion of this admission the patient was both mentally and medically stable for discharge denying suicidal/homicidal ideation, auditory/visual/tactile hallucinations, delusional thoughts and paranoia.      Consults:  psychiatry  Significant Diagnostic Studies:  labs: UDS, CBC, CMET, ETOH, Acetaminophen lever, Salicylate lever  Discharge Vitals:   Blood pressure 115/88, pulse 95, temperature 99.2 F (37.3 C), temperature source Oral, resp. rate 18, height 5\' 6"  (1.676 m), weight 76.204 kg (168 lb). Body mass index is 27.13 kg/(m^2). Lab Results:   Results for orders placed or performed during the hospital encounter of 09/17/14 (from the past 72 hour(s))  Urine rapid drug screen (hosp performed)     Status: Abnormal   Collection Time: 09/20/14  4:17 PM  Result Value Ref Range   Opiates POSITIVE (A) NONE DETECTED   Cocaine POSITIVE (A) NONE DETECTED   Benzodiazepines NONE DETECTED NONE DETECTED   Amphetamines NONE DETECTED NONE DETECTED   Tetrahydrocannabinol NONE DETECTED NONE DETECTED   Barbiturates NONE DETECTED NONE DETECTED    Comment:        DRUG SCREEN FOR MEDICAL PURPOSES ONLY.  IF CONFIRMATION IS NEEDED FOR ANY PURPOSE, NOTIFY LAB WITHIN 5 DAYS.        LOWEST DETECTABLE LIMITS FOR URINE DRUG SCREEN Drug Class       Cutoff  (ng/mL) Amphetamine      1000 Barbiturate      200 Benzodiazepine   200 Tricyclics       300 Opiates          300 Cocaine          300 THC              50 [facility]     Physical Findings: AIMS: Facial and Oral Movements Muscles of Facial Expression: None, normal Lips and Perioral Area: None, normal Jaw: None, normal Tongue: None, normal,Extremity Movements Upper (arms, wrists, hands, fingers): None, normal Lower (legs, knees, ankles, toes): None, normal, Trunk Movements Neck, shoulders, hips: None, normal, Overall Severity Severity of abnormal movements (highest score from questions above): None, normal Incapacitation due to abnormal movements: None, normal Patient's awareness of abnormal movements (rate only patient's report): No Awareness, Dental Status Current problems with teeth and/or dentures?: No Does patient usually wear dentures?: No  CIWA:    COWS:      See Psychiatric Specialty Exam and Suicide Risk Assessment completed by Attending Physician prior to discharge.  Discharge destination:  Home  Is patient on multiple antipsychotic therapies at discharge:  No   Has Patient had three or more failed trials of antipsychotic monotherapy by history:  No    Recommended Plan for Multiple Antipsychotic Therapies: NA      Discharge Instructions    Activity as tolerated - No restrictions    Complete by:  As directed      Diet general    Complete by:  As directed      Discharge instructions    Complete by:  As directed   Take all of you medications as prescribed by your mental healthcare provider.  Report any adverse effects and reactions from your medications to your outpatient provider promptly. Do not engage in alcohol and or illegal drug use while on prescription medicines. In the event of worsening symptoms call the crisis hotline, 911, and or go to the nearest emergency department for appropriate evaluation and treatment of  symptoms. Follow-up with your primary care provider for your medical issues, concerns and or health care needs.   Keep all scheduled appointments.  If you are unable to keep an appointment call to reschedule.  Let the nurse know if you will need medications before next scheduled appointment.            Medication List    TAKE these medications      Indication   ARIPiprazole 5 MG tablet  Commonly known as:  ABILIFY  Take 1 tablet (5 mg total) by mouth daily.   Indication:  Major Depressive Disorder     escitalopram 20 MG tablet  Commonly known as:  LEXAPRO  Take 1 tablet (20 mg total) by mouth daily.   Indication:  Depression     estradiol 2 MG tablet  Commonly known as:  ESTRACE  Take 1 tablet (2 mg total) by mouth 3 (three) times daily.   Indication:  Deficiency of the Hormone Estrogen     finasteride 5 MG tablet  Commonly known as:  PROSCAR  Take 1 tablet (5 mg total) by mouth daily.   Indication:  part of his hormonal therapy for transgendering     hydrOXYzine 25 MG tablet  Commonly known as:  ATARAX/VISTARIL  Take 1 tablet (25 mg total) by mouth at bedtime as needed for anxiety (Sleep).   Indication:  anxiety     spironolactone 50 MG tablet  Commonly known as:  ALDACTONE  Take 50 mg by mouth daily.        Follow-up Information    Follow up with        [facility] On 09/18/2014.   Contact information:   [address] [place], [place]   [address]  [phone]      Follow-up recommendations:  Activity:  As tolerated Diet:  As tolerated  Comments:   Patient has been instructed to take medications as prescribed; and report adverse effects to outpatient provider.  Follow up with primary doctor for any medical issues and If symptoms recur report to nearest emergency or crisis hot line.    Total Discharge Time: Greater than 30 minutes  Signed: Assunta FoundRankin, Shuvon, FNP-BC 09/21/2014, 10:19 AM  I personally assessed the patient and formulated  the plan Madie RenoIrving A. Dub MikesLugo, M.D.

## 2014-09-21 NOTE — [facility] Suicide Risk Assessment (Signed)
[facility]   Demographic Factors:  Caucasian  Total Time spent with patient: 30 minutes  Musculoskeletal: Strength & Muscle Tone: within normal limits Gait & Station: normal Patient leans: N/A  Psychiatric Specialty Exam: Physical Exam  Review of Systems  Constitutional: Negative.   HENT: Negative.   Eyes: Negative.   Respiratory: Negative.   Cardiovascular: Negative.   Gastrointestinal: Negative.   Genitourinary: Negative.   Musculoskeletal: Negative.   Skin: Negative.   Neurological: Negative.   Endo/Heme/Allergies: Negative.   Psychiatric/Behavioral: Positive for depression and substance abuse. The patient is nervous/anxious.     Blood pressure 115/88, pulse 95, temperature 99.2 F (37.3 C), temperature source Oral, resp. rate 18, height 5\' 6"  (1.676 m), weight 76.204 kg (168 lb).Body mass index is 27.13 kg/(m^2).  General Appearance: Fairly Groomed  Patent attorneyye Contact::  Fair  Speech:  Clear and Coherent409  Volume:  Normal  Mood:  Anxious and worried  Affect:  Appropriate  Thought Process:  Coherent and Goal Directed  Orientation:  Full (Time, Place, and Person)  Thought Content:  plans as she moves on, relapse prevention plan  Suicidal Thoughts:  No  Homicidal Thoughts:  No  Memory:  Immediate;   Fair Recent;   Fair Remote;   Fair  Judgement:  Fair  Insight:  Present  Psychomotor Activity:  Normal  Concentration:  Fair  Recall:  FiservFair  Fund of Knowledge:Fair  Language: Fair  Akathisia:  No  Handed:  Right  AIMS (if indicated):     Assets:  Desire for Improvement Housing Resilience  Sleep:  Number of Hours: 5  Cognition: WNL  ADL's:  Intact      Has this patient used any form of tobacco in the last 30 days? (Cigarettes, Smokeless Tobacco, Cigars, and/or Pipes) Yes, A prescription for an FDA-approved tobacco cessation medication was offered at discharge and the patient refused  Mental Status Per Nursing Assessment::   On  Admission:  Self-harm thoughts  Current Mental Status by Physician: In full contact with reality. There are no active S/S of withdrawal. There are no active SI plans or intent. She is still dealing with the death of her friend and knows it is going to be stressful when she goes back to her place as she died in her bed. If it gets to be too stressful she has a friend she can go to.   Loss Factors: Loss of significant relationship  Historical Factors: Impulsivity  Risk Reduction Factors:   Sense of responsibility to family and Positive social support  Continued Clinical Symptoms:  Depression:   Comorbid alcohol abuse/dependence Alcohol/Substance Abuse/Dependencies  Cognitive Features That Contribute To Risk:  Closed-mindedness, Polarized thinking and Thought constriction (tunnel vision)    Suicide Risk:  Minimal: No identifiable suicidal ideation.  Patients presenting with no risk factors but with morbid ruminations; may be classified as minimal risk based on the severity of the depressive symptoms  Principal Problem: MDD (major depressive disorder) Discharge Diagnoses:  Patient Active Problem List   Diagnosis Date Noted  . Major depression, recurrent [F33.9] 04/10/2012    Priority: High  . Cocaine dependence [F14.20] 04/10/2012    Priority: High  . Anxiety disorder [F41.9] 08/14/2011    Priority: High  . Cocaine abuse [F14.10] 08/14/2011    Priority: High  . Opioid dependence with opioid-induced mood disorder [F11.24]   . Cocaine dependence with cocaine-induced mood disorder [F14.24]   . MDD (major depressive disorder) [F32.2] 09/17/2014  . MDD (major depressive  disorder), recurrent severe, without psychosis [F33.2] 07/05/2014  . Polysubstance abuse including IVDA (heroin), cocaine, marijuana [F19.10] 01/10/2012  . Elevated LFTs [R79.89] 01/10/2012  . Chronic hepatitis C without hepatic coma [B18.2] 01/10/2012  . 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 Tobacco abuse [Z72.0] 01/10/2012  . Recurrent major depression-severe [F33.2] 08/13/2011    Follow-up Information    Follow up with        [facility] On 10/15/2014.   Why:  Monday, October 15, 2014 at 12:30 PM   Contact information:   [address] [place], [place]   [id number]  [phone]      Plan Of Care/Follow-up recommendations:  Activity:  as tolerated Diet:  regular Follow up [facility] as above Is patient on multiple antipsychotic therapies at discharge:  No   Has Patient had three or more failed trials of antipsychotic monotherapy by history:  No  Recommended Plan for Multiple Antipsychotic Therapies: NA    Addisynn Vassell A 09/21/2014, 11:36 AM

## 2014-09-21 NOTE — Progress Notes (Signed)
Pt has been out of his room this evening and attended evening karaoke group tonight.  Pt denies SI/HI/AVH tonight.  She tells Clinical research associatewriter that she may be discharged on Friday to go home to her apartment.  Pt admits she is still sad, but that she is no longer having suicidal thoughts.  Pt's interaction with staff and peers is still very minimal.  Pt makes her needs known to staff.  Support and encouragement offered.  Pt says she plans to continue her medication and to refrain from abusing drugs.  Safety maintained with q15 minute checks.

## 2014-11-17 ENCOUNTER — Encounter (HOSPITAL_COMMUNITY): Payer: Self-pay

## 2014-11-17 ENCOUNTER — Inpatient Hospital Stay (HOSPITAL_COMMUNITY)
Admission: AD | Admit: 2014-11-17 | Discharge: 2014-11-26 | DRG: 885 | Disposition: A | Discharge status: Home / Self Care | Payer: Medicare Other | Attending: Psychiatry | Admitting: Psychiatry

## 2014-11-17 DIAGNOSIS — F17210 Nicotine dependence, cigarettes, uncomplicated: Secondary | ICD-10-CM | POA: Diagnosis present

## 2014-11-17 DIAGNOSIS — B182 Chronic viral hepatitis C: Secondary | ICD-10-CM | POA: Diagnosis present

## 2014-11-17 DIAGNOSIS — F419 Anxiety disorder, unspecified: Secondary | ICD-10-CM | POA: Diagnosis present

## 2014-11-17 DIAGNOSIS — F1490 Cocaine use, unspecified, uncomplicated: Secondary | ICD-10-CM | POA: Diagnosis not present

## 2014-11-17 DIAGNOSIS — Z590 Homelessness: Secondary | ICD-10-CM

## 2014-11-17 DIAGNOSIS — F332 Major depressive disorder, recurrent severe without psychotic features: Principal | ICD-10-CM

## 2014-11-17 DIAGNOSIS — G4700 Insomnia, unspecified: Secondary | ICD-10-CM | POA: Diagnosis present

## 2014-11-17 DIAGNOSIS — F1124 Opioid dependence with opioid-induced mood disorder: Secondary | ICD-10-CM | POA: Diagnosis present

## 2014-11-17 DIAGNOSIS — F1190 Opioid use, unspecified, uncomplicated: Secondary | ICD-10-CM | POA: Diagnosis not present

## 2014-11-17 DIAGNOSIS — F1410 Cocaine abuse, uncomplicated: Secondary | ICD-10-CM | POA: Diagnosis present

## 2014-11-17 DIAGNOSIS — F1424 Cocaine dependence with cocaine-induced mood disorder: Secondary | ICD-10-CM | POA: Diagnosis present

## 2014-11-17 DIAGNOSIS — R45851 Suicidal ideations: Secondary | ICD-10-CM | POA: Diagnosis present

## 2014-11-17 MED ORDER — ACETAMINOPHEN 325 MG PO TABS
650.0000 mg | ORAL_TABLET | Freq: Four times a day (QID) | ORAL | Status: DC | PRN
  Administered 2014-11-20 – 2014-11-24 (×5): 650 mg via ORAL
  Filled 2014-11-17 (×5): qty 2

## 2014-11-17 MED ORDER — THIAMINE HCL 100 MG/ML IJ SOLN: 100.0000 mg | Freq: Once | INTRAMUSCULAR | Status: DC

## 2014-11-17 MED ORDER — LOPERAMIDE HCL 2 MG PO CAPS: 2.0000 mg | ORAL_CAPSULE | ORAL | Status: AC | PRN

## 2014-11-17 MED ORDER — LORAZEPAM 1 MG PO TABS
1.0000 mg | ORAL_TABLET | Freq: Three times a day (TID) | ORAL | Status: AC
  Administered 2014-11-19 (×3): 1 mg via ORAL
  Filled 2014-11-17 (×3): qty 1

## 2014-11-17 MED ORDER — TRAZODONE HCL 50 MG PO TABS
50.0000 mg | ORAL_TABLET | Freq: Every evening | ORAL | Status: DC | PRN
  Administered 2014-11-21: 50 mg via ORAL
  Filled 2014-11-17 (×2): qty 1

## 2014-11-17 MED ORDER — LORAZEPAM 1 MG PO TABS
1.0000 mg | ORAL_TABLET | Freq: Two times a day (BID) | ORAL | Status: AC
  Administered 2014-11-20 (×2): 1 mg via ORAL
  Filled 2014-11-17: qty 2
  Filled 2014-11-17: qty 1

## 2014-11-17 MED ORDER — LORAZEPAM 1 MG PO TABS
1.0000 mg | ORAL_TABLET | Freq: Every day | ORAL | Status: AC
  Administered 2014-11-21: 1 mg via ORAL
  Filled 2014-11-17: qty 1

## 2014-11-17 MED ORDER — VITAMIN B-1 100 MG PO TABS
100.0000 mg | ORAL_TABLET | Freq: Every day | ORAL | Status: DC
  Administered 2014-11-18 – 2014-11-26 (×9): 100 mg via ORAL
  Filled 2014-11-17 (×11): qty 1

## 2014-11-17 MED ORDER — MAGNESIUM HYDROXIDE 400 MG/5ML PO SUSP: 30.0000 mL | Freq: Every day | ORAL | Status: DC | PRN

## 2014-11-17 MED ORDER — LORAZEPAM 1 MG PO TABS: 1.0000 mg | ORAL_TABLET | Freq: Four times a day (QID) | ORAL | Status: AC | PRN

## 2014-11-17 MED ORDER — ADULT MULTIVITAMIN W/MINERALS CH
1.0000 | ORAL_TABLET | Freq: Every day | ORAL | Status: DC
  Administered 2014-11-18 – 2014-11-26 (×9): 1 via ORAL
  Filled 2014-11-17 (×12): qty 1

## 2014-11-17 MED ORDER — ALUM & MAG HYDROXIDE-SIMETH 200-200-20 MG/5ML PO SUSP: 30.0000 mL | ORAL | Status: DC | PRN

## 2014-11-17 MED ORDER — HYDROXYZINE HCL 25 MG PO TABS
25.0000 mg | ORAL_TABLET | Freq: Four times a day (QID) | ORAL | Status: AC | PRN
  Filled 2014-11-17: qty 1

## 2014-11-17 MED ORDER — ONDANSETRON 4 MG PO TBDP: 4.0000 mg | ORAL_TABLET | Freq: Four times a day (QID) | ORAL | Status: AC | PRN

## 2014-11-17 MED ORDER — LORAZEPAM 1 MG PO TABS
1.0000 mg | ORAL_TABLET | Freq: Four times a day (QID) | ORAL | Status: AC
Start: 2014-11-17 — End: 2014-11-19
  Administered 2014-11-17 – 2014-11-18 (×5): 1 mg via ORAL
  Filled 2014-11-17 (×5): qty 1

## 2014-11-17 NOTE — Progress Notes (Signed)
D.  Pt in bed on approach, denies complaints at this time.  Pt did not feel well enough to attend evening wrap up group.  Minimal interaction at this time, Pt has remained in bed.  Passive SI voiced but does contract for safety on the unit.  Negative for HI/hallucinatons at this time.  A.  Support and encouragement offered  R. Pt remains safe on the unit, will continue to monitor.

## 2014-11-17 NOTE — Progress Notes (Signed)
D) This is one of many admissions for this male transitioning to male Patient. Pt states that she has been living in an apartment that a friend of hers died in on June 9th. States, "I just can't live there anymore. I have been trying to take care of the loss by using drugs but that's the wrong way to do it". Pt states she has been using cocaine and heroine"as much as I can get my hands on" and smoking the cocaine at times, but is also injecting both into her veins. Pt states she is having some active thoughts of suicide. Affect is sad and tearful, even sobbing. States "I really miss my friend a lot". A) Pt given support and reassurance along with encouragement. Provided with a lengthy 1:1. Active listening provided along with praise for coming to the hospital and realizing that "this is not the way to take care of my sadness". Belongings placed in locker # 7. Pt reoriented to the unit. Offereed fluid and food. R) Pt states she wants to work on being healthy and helping herself to not use the drugs and to deal with her loss in a better way.

## 2014-11-17 NOTE — Progress Notes (Signed)
Psychoeducational Group Note  Date:  11/17/2014 Time:  2100 Group Topic/Focus:  wrap up group  Participation Level: Did Not Attend  Participation Quality:  Not Applicable  Affect:  Not Applicable  Cognitive:  Not Applicable  Insight:  Not Applicable  Engagement in Group: Not Applicable  Additional Comments:  Pt remained in bed asleep having only been on unit a few hours.   Shelah LewandowskySquires, Randol Zumstein Carol 11/17/2014, 11:35 PM

## 2014-11-17 NOTE — BH Assessment (Addendum)
Tele Assessment Note   Randy [name] is an 44 y.o. male transitioning to male who came in as a walk in to [facility] c/o increasing depression with intermittent suicidal ideation with a plan to overdose. She states that her life began going downhill about a month and a half ago when her friend died of a heroin overdose in her home. Pt came in to [facility] at that time for treatment, but says that she stopped paying rent on her apartment (becasue her friend died there), so her landlord kicked her out and "illegally got rid of all of my belongings". Pt was then homeless, and relapsed on heroin and crack about 5 weeks ago. Pt states she has been taking her meds prescribed by her Op provider of 2 yrs (Dr. Jannifer [name]) "most of the time", but is having multiple depressive symptoms including sleeping 4 hrs/night, crying more than usual, difficulty concentrating and getting tasks done, and appetite loss. Pt states that she cannot contract for safety and is scared that she will do something to hurt herself if she does not get help.  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  She has had difficulty completing these tasks recently due to depression.  During interview, pt was calm and cooperative, with normal thought content, movement and speech. Pt was dressed casually and appeared a bit disheveled. Pt made poor eye contact, denies HI, AVH, history of violence and there was no evidence of responding to internal stimuli. Please see SA section for more details of SA use, but pt last used heroin and crack yesterday, and says she wants long term treatment after being stabilized in the hospital.  Randy Caprice, NP recommends IP treatment. Pt awaiting a room at [facility].  Axis I: Major Depression, Recurrent severe Axis II: Deferred Axis III:  Past Medical History  Diagnosis Date  . Depression   . Hepatitis C   . 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  Cellulitis  Of  Left Forearm 01/10/2012    From IVDA   Axis IV: economic problems, housing problems and other psychosocial or environmental problems Axis V: 41-50 serious symptoms  Past Medical History:  Past Medical History  Diagnosis Date  . Depression   . Hepatitis C   . 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 Cellulitis  Of  Left Forearm 01/10/2012    From IVDA    Past Surgical History  Procedure Laterality Date  . I&d extremity  01/13/2012    Procedure: IRRIGATION AND DEBRIDEMENT EXTREMITY;  Surgeon: Kennieth Rad, MD;  Location: [facility];  Service: Orthopedics;  Laterality: Left;    Family History:  Family History  Problem Relation Age of Onset  . Idiopathic pulmonary fibrosis Father     Social History:  reports that he has been smoking Cigarettes.  He has a 10 pack-year smoking history. He has never used smokeless tobacco. He reports that he drinks alcohol. He reports that he uses illicit drugs (Heroin and "Crack" cocaine).  Additional Social History:  Alcohol / Drug Use Pain Medications: denies Prescriptions: denies Over the Counter: denies History of alcohol / drug use?: Yes Longest period of sobriety (when/how long): unknown Negative Consequences of Use: Financial, Personal relationships, Work / School Withdrawal Symptoms: Nausea / Vomiting Substance #1 Name of Substance 1: heroin 1 - Age of First Use: 42 1 - Amount (size/oz): $20 1 - Frequency: at least every other day  1 - Duration: months 1 - Last Use / Amount: yesterday Substance #2 Name of Substance 2: Cocaine (crack) 2 - Age of First Use: 33 2 - Amount (size/oz): $40 2 - Frequency: daily 2 - Duration: months 2 - Last Use / Amount: yesterday  CIWA:   COWS: Clinical Opiate Withdrawal Scale (COWS) Resting Pulse Rate: Pulse Rate 80 or below (unable to take pulse) Sweating: No report of chills or flushing Restlessness: Able to sit still Pupil Size: Pupils pinned or normal size for room  light Bone or Joint Aches: Mild diffuse discomfort Runny Nose or Tearing: Not present GI Upset: nausea or loose stool Tremor: No tremor Yawning: No yawning Anxiety or Irritability: Patient reports increasing irritability or anxiousness Gooseflesh Skin: Skin is smooth COWS Total Score: 4  PATIENT STRENGTHS: (choose at least two) Ability for insight Average or above average intelligence Capable of independent living Communication skills Motivation for treatment/growth  Allergies:  Allergies  Allergen Reactions  . Penicillins Other (See Comments)    Swollen joints     Home Medications:  (Not in a hospital admission)  OB/GYN Status:  No LMP for male patient.  General Assessment Data [facility] TTS Assessment: In system Is this a Tele or Face-to-Face Assessment?: Face-to-Face Is this an Initial Assessment or a Re-assessment for this encounter?: Initial Assessment Marital status: Single Is patient pregnant?: No Pregnancy Status: No Living Arrangements: Alone (homeless (motel, with friends)) Can pt return to current living arrangement?: Yes Admission Status: Voluntary Is patient capable of signing voluntary admission?: Yes Referral Source: Self/Family/Friend Insurance type:  [facility](MCR)  Medical Screening Exam [facility] Walk-in ONLY) Medical Exam completed: No Reason for MSE not completed: Other: (pt admitted, will be seen by MD)  Crisis Care Plan Living Arrangements: Alone (homeless (motel, with friends)) Name of Psychiatrist: Akintayo Name of Therapist: none  Education Status Is patient currently in school?: No  Risk to self with the past 6 months Suicidal Ideation: No-Not Currently/Within Last 6 Months (can't contract for safety) Has patient been a risk to self within the past 6 months prior to admission? : No Suicidal Intent: No-Not Currently/Within Last 6 Months Has patient had any suicidal intent within the past 6 months prior to  admission? : No Is patient at risk for suicide?: Yes Suicidal Plan?: No-Not Currently/Within Last 6 Months Has patient had any suicidal plan within the past 6 months prior to admission? : Yes (overdose) Access to Means: Yes Specify Access to Suicidal Means:  (pills) What has been your use of drugs/alcohol within the last 12 months?:  (used crack and heroin yesterday) Previous Attempts/Gestures: No Other Self Harm Risks: SA Intentional Self Injurious Behavior: None Family Suicide History: Unknown Recent stressful life event(s): Financial Problems, Trauma (Comment) (friend died of overdose in her home, pt now homeless) Persecutory voices/beliefs?: No Depression: No Depression Symptoms: Despondent, Insomnia, Tearfulness, Isolating, Fatigue, Guilt, Loss of interest in usual pleasures, Feeling worthless/self pity, Feeling angry/irritable Substance abuse history and/or treatment for substance abuse?: Yes Suicide prevention information given to non-admitted patients: Not applicable  Risk to Others within the past 6 months Homicidal Ideation: No Does patient have any lifetime risk of violence toward others beyond the six months prior to admission? : No Thoughts of Harm to Others: No Current Homicidal Intent: No Current Homicidal Plan: No Access to Homicidal Means: No History of harm to others?: No Assessment of Violence: None Noted Does patient have access to weapons?: No Criminal Charges Pending?: No Does patient have  a court date: No Is patient on probation?: No  Psychosis Hallucinations: None noted Delusions: None noted  Mental Status Report Appearance/Hygiene: Disheveled Eye Contact: Poor Motor Activity: Unremarkable Speech: Logical/coherent Level of Consciousness: Alert Mood: Anxious, Sad, Depressed Affect: Depressed, Sad, Anxious Anxiety Level: Panic Attacks Panic attack frequency: variable Most recent panic attack: yesterday Thought Processes: Coherent,  Relevant Judgement: Partial Orientation: Person, Place, Time, Situation, Appropriate for developmental age Obsessive Compulsive Thoughts/Behaviors: Minimal  Cognitive Functioning Concentration: Decreased Memory: Recent Intact, Remote Intact IQ: Average Insight: Fair Impulse Control: Fair Appetite: Poor Weight Loss: 0 Weight Gain: 0 Sleep: Decreased Total Hours of Sleep: 4 Vegetative Symptoms: None  ADLScreening [facility] Assessment Services) Patient's cognitive ability adequate to safely complete daily activities?: Yes Patient able to express need for assistance with ADLs?: Yes Independently performs ADLs?: Yes (appropriate for developmental age)  Prior Inpatient Therapy Prior Inpatient Therapy: Yes Prior Therapy Dates: May 2016 Prior Therapy Facilty/Provider(s): [facility], [facility] Reason for Treatment: depression, SA  Prior Outpatient Therapy Prior Outpatient Therapy: Yes Prior Therapy Dates: past 2 yrs Prior Therapy Facilty/Provider(s): Randy [name] Reason for Treatment: depression Does patient have an ACCT team?: No Does patient have Intensive In-House Services?  : No Does patient have [facility] services? : No Does patient have P4CC services?: No  ADL Screening (condition at time of admission) Patient's cognitive ability adequate to safely complete daily activities?: Yes Is the patient deaf or have difficulty hearing?: No Does the patient have difficulty seeing, even when wearing glasses/contacts?: No Does the patient have difficulty concentrating, remembering, or making decisions?: No Patient able to express need for assistance with ADLs?: Yes Does the patient have difficulty dressing or bathing?: No Independently performs ADLs?: Yes (appropriate for developmental age) Does the patient have difficulty walking or climbing stairs?: No Weakness of Legs: None Weakness of Arms/Hands: None  Home Assistive Devices/Equipment Home Assistive Devices/Equipment: None    Abuse/Neglect  Assessment (Assessment to be complete while patient is alone) Physical Abuse: Denies Verbal Abuse: Denies Sexual Abuse: Denies Exploitation of patient/patient's resources: Denies Self-Neglect: Denies Values / Beliefs Cultural Requests During Hospitalization: None Spiritual Requests During Hospitalization: None   Advance Directives (For Healthcare) Does patient have an advance directive?: No Would patient like information on creating an advanced directive?: No - patient declined information    Additional Information 1:1 In Past 12 Months?: No CIRT Risk: No Elopement Risk: No Does patient have medical clearance?: No     Disposition:  Disposition Initial Assessment Completed for this Encounter: Yes Disposition of Patient: Inpatient treatment program  [facility] 11/17/2014 1:09 PM

## 2014-11-18 ENCOUNTER — Encounter (HOSPITAL_COMMUNITY): Payer: Self-pay | Admitting: Registered Nurse

## 2014-11-18 DIAGNOSIS — F332 Major depressive disorder, recurrent severe without psychotic features: Secondary | ICD-10-CM

## 2014-11-18 DIAGNOSIS — F1490 Cocaine use, unspecified, uncomplicated: Secondary | ICD-10-CM

## 2014-11-18 DIAGNOSIS — F1190 Opioid use, unspecified, uncomplicated: Secondary | ICD-10-CM

## 2014-11-18 DIAGNOSIS — R45851 Suicidal ideations: Secondary | ICD-10-CM

## 2014-11-18 LAB — CBC WITH DIFFERENTIAL/PLATELET
BASOS PCT: 0 % (ref 0–1)
Basophils Absolute: 0 10*3/uL (ref 0.0–0.1)
EOS ABS: 0.2 10*3/uL (ref 0.0–0.7)
EOS PCT: 3 % (ref 0–5)
HEMATOCRIT: 49.4 % (ref 39.0–52.0)
HEMOGLOBIN: 16.3 g/dL (ref 13.0–17.0)
Lymphocytes Relative: 36 % (ref 12–46)
Lymphs Abs: 2.5 10*3/uL (ref 0.7–4.0)
MCH: 31 pg (ref 26.0–34.0)
MCHC: 33 g/dL (ref 30.0–36.0)
MCV: 93.9 fL (ref 78.0–100.0)
MONO ABS: 0.3 10*3/uL (ref 0.1–1.0)
MONOS PCT: 5 % (ref 3–12)
Neutro Abs: 3.8 10*3/uL (ref 1.7–7.7)
Neutrophils Relative %: 56 % (ref 43–77)
Platelets: 149 10*3/uL — ABNORMAL LOW (ref 150–400)
RBC: 5.26 MIL/uL (ref 4.22–5.81)
RDW: 13.6 % (ref 11.5–15.5)
WBC: 6.8 10*3/uL (ref 4.0–10.5)

## 2014-11-18 LAB — COMPREHENSIVE METABOLIC PANEL
ALBUMIN: 4.1 g/dL (ref 3.5–5.0)
ALK PHOS: 56 U/L (ref 38–126)
ALT: 42 U/L (ref 17–63)
AST: 27 U/L (ref 15–41)
Anion gap: 8 (ref 5–15)
BILIRUBIN TOTAL: 0.6 mg/dL (ref 0.3–1.2)
BUN: 21 mg/dL — ABNORMAL HIGH (ref 6–20)
CO2: 24 mmol/L (ref 22–32)
Calcium: 9.3 mg/dL (ref 8.9–10.3)
Chloride: 106 mmol/L (ref 101–111)
Creatinine, Ser: 1.19 mg/dL (ref 0.61–1.24)
GFR calc Af Amer: >60 mL/min (ref >60)
GFR calc non Af Amer: >60 mL/min (ref >60)
Glucose, Bld: 93 mg/dL (ref 65–99)
POTASSIUM: 4.6 mmol/L (ref 3.5–5.1)
Sodium: 138 mmol/L (ref 135–145)
Total Protein: 7.6 g/dL (ref 6.5–8.1)

## 2014-11-18 LAB — TSH: TSH: 0.304 u[IU]/mL — ABNORMAL LOW (ref 0.350–4.500)

## 2014-11-18 MED ORDER — FINASTERIDE 5 MG PO TABS
5.0000 mg | ORAL_TABLET | Freq: Every day | ORAL | Status: DC
  Administered 2014-11-18 – 2014-11-26 (×9): 5 mg via ORAL
  Filled 2014-11-18 (×11): qty 1

## 2014-11-18 MED ORDER — ARIPIPRAZOLE 5 MG PO TABS
5.0000 mg | ORAL_TABLET | Freq: Every day | ORAL | Status: DC
  Administered 2014-11-18 – 2014-11-26 (×9): 5 mg via ORAL
  Filled 2014-11-18: qty 3
  Filled 2014-11-18 (×10): qty 1

## 2014-11-18 MED ORDER — SPIRONOLACTONE 25 MG PO TABS
50.0000 mg | ORAL_TABLET | Freq: Every day | ORAL | Status: DC
  Administered 2014-11-18 – 2014-11-26 (×9): 50 mg via ORAL
  Filled 2014-11-18 (×2): qty 1
  Filled 2014-11-18: qty 2
  Filled 2014-11-18 (×3): qty 1
  Filled 2014-11-18 (×4): qty 2
  Filled 2014-11-18 (×2): qty 1

## 2014-11-18 MED ORDER — ESTRADIOL 2 MG PO TABS
2.0000 mg | ORAL_TABLET | Freq: Three times a day (TID) | ORAL | Status: DC
  Administered 2014-11-18 – 2014-11-26 (×24): 2 mg via ORAL
  Filled 2014-11-18 (×29): qty 1

## 2014-11-18 MED ORDER — ESCITALOPRAM OXALATE 20 MG PO TABS
20.0000 mg | ORAL_TABLET | Freq: Every day | ORAL | Status: DC
  Administered 2014-11-18 – 2014-11-26 (×9): 20 mg via ORAL
  Filled 2014-11-18 (×5): qty 1
  Filled 2014-11-18: qty 3
  Filled 2014-11-18 (×5): qty 1

## 2014-11-18 NOTE — Progress Notes (Signed)
Patient did not attend the evening speaker AA meeting. Pt was notified that group was beginning but remained in bed.   

## 2014-11-18 NOTE — [facility] Group Notes (Signed)
[facility] Group Notes: (Clinical Social Work)   11/18/2014      Type of Therapy:  Group Therapy   Participation Level:  Did Not Attend despite MHT prompting   Ambrose MantleMareida Grossman-Orr, LCSW 11/18/2014, 12:23 PM

## 2014-11-18 NOTE — [facility] Group Notes (Signed)
[facility] Group Notes:  Relapse prevention  Date:  11/18/2014  Time:  12:46 PM  Type of Therapy:  Nurse Education  Participation Level:  Did Not Attend  Participation Quality:  Inattentive  Affect:  Blunted  Cognitive:  Lacking  Insight:  None  Engagement in Group:  None  Modes of Intervention:  Discussion  Summary of Progress/Problems:  Randy [name], Randy [name] 11/18/2014, 12:46 PM

## 2014-11-18 NOTE — H&P (Signed)
Psychiatric Admission Assessment Adult  Patient Identification: Randy [name] MRN:  [id number] Date of Evaluation:  11/18/2014 Chief Complaint:  MDD RECURRENT SEVERE SUBSTANCE ABUSE Principal Diagnosis: Major depressive disorder, recurrent, severe without psychotic features Diagnosis:   Patient Active Problem List   Diagnosis Date Noted  . Major depressive disorder, recurrent, severe without psychotic features [F33.2]   . Opioid dependence with opioid-induced mood disorder [F11.24]   . Cocaine dependence with cocaine-induced mood disorder [F14.24]   . MDD (major depressive disorder) [F32.2] 09/17/2014  . MDD (major depressive disorder), recurrent severe, without psychosis [F33.2] 07/05/2014  . Major depression, recurrent [F33.9] 04/10/2012  . Cocaine dependence [F14.20] 04/10/2012  . Polysubstance abuse including IVDA (heroin), cocaine, marijuana [F19.10] 01/10/2012  . Elevated LFTs [R79.89] 01/10/2012  . Chronic hepatitis C without hepatic coma [B18.2] 01/10/2012  . 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 Tobacco abuse [Z72.0] 01/10/2012  . Anxiety disorder [F41.9] 08/14/2011  . Cocaine abuse [F14.10] 08/14/2011  . Recurrent major depression-severe [F33.2] 08/13/2011   History of Present Illness:: Randy [name] is an 44 yr old male transitioning to male patient.  Patient walked into [facility] yesterday with complaints of worsening depression, anxiety, and suicidal thoughts with a plan to over dose on heroin.  Patient's partner recently died of and overdose of heroin.  Since his partner died he has lost his home and is now homeless and has relapsed on heroin and cocaine.  Patient states that he has no support system. Patient states "I haven't been doing well; my depressing is worse."  When asked about his current state of suicidal thoughts patient stated "I don't want to go into it have talked about this and answered this questions about three or four time to  somebody.  Patient is sitting in chair holding his head in his hand. States that he uses heroin and cocaine "every other day and last use was Friday.   Denies the use of alcohol. Patient denies homicidal ideation, psychosis, and paranoia.  Patient has a history of psych inpatient hospitalization and detox.  Outpatient services with Dr. Jannifer Franklin.  Patient states that he was taking his medication regularly.      States that depressive symptoms including sleeping 4 hours a night, crying more than usual, difficulty concentrating, unable to complete tasks, and decrease appetite.   Elements:  Location:  Worsening depression. Quality:  Suicidal thoughts. Severity:  Severe. Duration:  several days. Associated Signs/Symptoms: Depression Symptoms:  depressed mood, feelings of worthlessness/guilt, hopelessness, suicidal thoughts with specific plan, loss of energy/fatigue, decreased appetite, (Hypo) Manic Symptoms:  Irritable Mood, Anxiety Symptoms:  Excessive Worry, Panic Symptoms, Psychotic Symptoms:  Denies PTSD Symptoms: Denies Total Time spent with patient: 1 hour  Past Medical History:  Past Medical History  Diagnosis Date  . Depression   . Hepatitis C   . 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 Cellulitis  Of  Left Forearm 01/10/2012    From IVDA    Past Surgical History  Procedure Laterality Date  . I&d extremity  01/13/2012    Procedure: IRRIGATION AND DEBRIDEMENT EXTREMITY;  Surgeon: Kennieth Rad, MD;  Location: [facility];  Service: Orthopedics;  Laterality: Left;   Family History:  Family History  Problem Relation Age of Onset  . Idiopathic pulmonary fibrosis Father    Social History:  History  Alcohol Use  . 0.0 oz/week    Comment: used once     History  Drug Use  . Yes  . Special:  Heroin, "Crack" cocaine    Comment: heroin last used 07/14/14, crack    History   Social History  . Marital Status: Single    Spouse Name: N/A  . Number of Children: N/A   . Years of Education: N/A   Occupational History  . 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 Smoking status: Current Every Day Smoker -- 0.50 packs/day for 20 years    Types: Cigarettes  . Smokeless tobacco: Never Used     Comment: trying to quit-using e-cig some  . Alcohol Use: 0.0 oz/week     Comment: used once  . Drug Use: Yes    Special: Heroin, "Crack" cocaine     Comment: heroin last used 07/14/14, crack  . Sexual Activity:    Partners: Male    Birth Control/ Protection: Condom     Comment: Has been HIV tested in past and has been negative.   Other Topics Concern  . None   Social History Narrative   Transgendered male.  Single.  Lives alone.  Independent.   Additional Social History:    Pain Medications: denies Prescriptions: denies Over the Counter: denies History of alcohol / drug use?: Yes Longest period of sobriety (when/how long): unknown Negative Consequences of Use: Financial, Personal relationships, Work / School Withdrawal Symptoms: Nausea / Vomiting Name of Substance 1: heroin 1 - Age of First Use: 42 1 - Amount (size/oz): $20 1 - Frequency: at least every other day 1 - Duration: months 1 - Last Use / Amount: yesterday Name of Substance 2: Cocaine (crack) 2 - Age of First Use: 33 2 - Amount (size/oz): $40 2 - Frequency: daily 2 - Duration: months 2 - Last Use / Amount: yesterday   Musculoskeletal: Strength & Muscle Tone: within normal limits Gait & Station: normal Patient leans: N/A  Psychiatric Specialty Exam: Physical Exam  Constitutional: He is oriented to person, place, and time.  Neck: Normal range of motion.  Respiratory: Effort normal.  Musculoskeletal: Normal range of motion.  Neurological: He is alert and oriented to person, place, and time.    Review of Systems  Psychiatric/Behavioral: Positive for depression and suicidal ideas.    Blood pressure 95/72, pulse 85, temperature 98.4 F (36.9 C),  temperature source Oral, resp. rate 17, height  (1.676 m), weight 76.658 kg (169 lb), SpO2 99 %.Body mass index is 27.29 kg/(m^2).  General Appearance: Disheveled  Eye Solicitor::  None  Speech:  Slow  Volume:  Decreased  Mood:  Depressed, Hopeless and Irritable  Affect:  Depressed and Flat  Thought Process:  Linear  Orientation:  Full (Time, Place, and Person)  Thought Content:  Denies hallucinations, delusions, and paranoia  Suicidal Thoughts:  Yes.  with intent/plan  Homicidal Thoughts:  No  Memory:  Immediate;   Good Recent;   Good Remote;   Good  Judgement:  Poor  Insight:  Lacking  Psychomotor Activity:  Decreased  Concentration:  Fair  Recall:  Good  Fund of Knowledge:Fair  Language: Fair  Akathisia:  Negative  Handed:  Right  AIMS (if indicated):     Assets:  Desire for Improvement  ADL's:  Intact  Cognition: WNL  Sleep:      Risk to Self: Suicidal Ideation: No-Not Currently/Within Last 6 Months (can't contract for safety) Suicidal Intent: No-Not Currently/Within Last 6 Months Is patient at risk for suicide?: Yes Suicidal Plan?: No-Not Currently/Within Last 6 Months Access to Means: Yes Specify  Access to Suicidal Means:  (pills) What has been your use of drugs/alcohol within the last 12 months?:  (used crack and heroin yesterday) Other Self Harm Risks: SA Intentional Self Injurious Behavior: None Risk to Others: Homicidal Ideation: No Thoughts of Harm to Others: No Current Homicidal Intent: No Current Homicidal Plan: No Access to Homicidal Means: No History of harm to others?: No Assessment of Violence: None Noted Does patient have access to weapons?: No Criminal Charges Pending?: No Does patient have a court date: No Prior Inpatient Therapy: Prior Inpatient Therapy: Yes Prior Therapy Dates: May 2016 Prior Therapy Facilty/Provider(s): [facility], [facility] Reason for Treatment: depression, SA Prior Outpatient Therapy: Prior Outpatient Therapy: Yes Prior Therapy  Dates: past 2 yrs Prior Therapy Facilty/Provider(s): [facility] Reason for Treatment: depression Does patient have an ACCT team?: No Does patient have Intensive In-House Services?  : No Does patient have [facility] services? : No Does patient have P4[facility] services?: No  Alcohol Screening: 1. How often do you have a drink containing alcohol?: Never 9. Have you or someone else been injured as a result of your drinking?: No 10. Has a relative or friend or a doctor or another health worker been concerned about your drinking or suggested you cut down?: No Alcohol Use Disorder Identification Test Final Score (AUDIT): 0  Allergies:   Allergies  Allergen Reactions  . Penicillins Other (See Comments)    Swollen joints    Lab Results: No results found for this or any previous visit (from the past 48 hour(s)). Current Medications: Current Facility-Administered Medications  Medication Dose Route Frequency Provider Last Rate Last Dose  . acetaminophen (TYLENOL) tablet 650 mg  650 mg Oral Q6H PRN Adonis BrookSheila Agustin, NP      . alum & mag hydroxide-simeth (MAALOX/MYLANTA) 200-200-20 MG/5ML suspension 30 mL  30 mL Oral Q4H PRN Adonis BrookSheila Agustin, NP      . hydrOXYzine (ATARAX/VISTARIL) tablet 25 mg  25 mg Oral Q6H PRN Adonis BrookSheila Agustin, NP      . loperamide (IMODIUM) capsule 2-4 mg  2-4 mg Oral PRN Adonis BrookSheila Agustin, NP      . LORazepam (ATIVAN) tablet 1 mg  1 mg Oral Q6H PRN Adonis BrookSheila Agustin, NP      . LORazepam (ATIVAN) tablet 1 mg  1 mg Oral QID Adonis BrookSheila Agustin, NP   1 mg at 11/18/14 0830   Followed by  . [START ON 11/19/2014] LORazepam (ATIVAN) tablet 1 mg  1 mg Oral TID Adonis BrookSheila Agustin, NP       Followed by  . [START ON 11/20/2014] LORazepam (ATIVAN) tablet 1 mg  1 mg Oral BID Adonis BrookSheila Agustin, NP       Followed by  . [START ON 11/21/2014] LORazepam (ATIVAN) tablet 1 mg  1 mg Oral Daily Adonis BrookSheila Agustin, NP      . magnesium hydroxide (MILK OF MAGNESIA) suspension 30 mL  30 mL Oral Daily PRN Adonis BrookSheila Agustin, NP      .  multivitamin with minerals tablet 1 tablet  1 tablet Oral Daily Adonis BrookSheila Agustin, NP   1 tablet at 11/18/14 0830  . ondansetron (ZOFRAN-ODT) disintegrating tablet 4 mg  4 mg Oral Q6H PRN Adonis BrookSheila Agustin, NP      . thiamine (B-1) injection 100 mg  100 mg Intramuscular Once Adonis BrookSheila Agustin, NP   100 mg at 11/17/14 1830  . thiamine (VITAMIN B-1) tablet 100 mg  100 mg Oral Daily Adonis BrookSheila Agustin, NP   100 mg at 11/18/14 0829  . traZODone (DESYREL) tablet 50 mg  50 mg Oral QHS PRN Adonis Brook, NP       PTA Medications: Prescriptions prior to admission  Medication Sig Dispense Refill Last Dose  . ARIPiprazole (ABILIFY) 5 MG tablet Take 1 tablet (5 mg total) by mouth daily. 30 tablet 0 Past Week at Unknown time  . escitalopram (LEXAPRO) 20 MG tablet Take 1 tablet (20 mg total) by mouth daily. 30 tablet 0 Past Week at Unknown time  . estradiol (ESTRACE) 2 MG tablet Take 1 tablet (2 mg total) by mouth 3 (three) times daily. 90 tablet 0 Past Week at Unknown time  . finasteride (PROSCAR) 5 MG tablet Take 1 tablet (5 mg total) by mouth daily. 30 tablet 0 Past Week at Unknown time  . hydrOXYzine (ATARAX/VISTARIL) 25 MG tablet Take 1 tablet (25 mg total) by mouth at bedtime as needed for anxiety (Sleep). 30 tablet 0 Past Week at Unknown time  . spironolactone (ALDACTONE) 50 MG tablet Take 50 mg by mouth daily.   Past Week at Unknown time    Previous Psychotropic Medications: Yes   Substance Abuse History in the last 12 months:  Yes.      Consequences of Substance Abuse: Family Consequences:  Family discord Withdrawal Symptoms:   Cramps Diaphoresis Headaches Tremors Vomiting  No results found for this or any previous visit (from the past 72 hour(s)).  Observation Level/Precautions:  15 minute checks  Laboratory:  CBC Chemistry Profile HbAIC UDS UA  Psychotherapy:  Individual and group sessions  Medications:  Will start medications add/adjust as appropriate for patient stabilization   Consultations:  Psychiatry  Discharge Concerns:  Safety, stabilization, and risk of access to medication and medication stabilization   Estimated LOS:  5-7 days  Other:     Psychological Evaluations: Yes   Treatment Plan Summary: Daily contact with patient to assess and evaluate symptoms and progress in treatment and Medication management  1. Admit for crisis management and stabilization 2. Medication management to reduce current symptoms to bale line and improve the patient's overall level of functioning 3. Treat health problems as indicated 4. Develop treatment plan to decrease risk of relapse upon discharge and the need for readmission. 5. Psycho-social education regarding relapse prevention and self care. 6. Health care follow up as needed for medical problems 7. Restart home medications where appropriate:  Abilify 5 mg daily for major depression, Lexapro 20 mg daily for depression; and other personal home medications.   Medical Decision Making:  Review of Psycho-Social Stressors (1), Review or order clinical lab tests (1), Review and summation of old records (2), Review of Last Therapy Session (1), Independent Review of image, tracing or specimen (2) and Review of Medication Regimen & Side Effects (2)  I certify that inpatient services furnished can reasonably be expected to improve the patient's condition.   Rankin, Shuvon, FNP-BC 7/10/20162:36 PM I have examined the patient and agreed with the findings of H&P and treatment plan. I have also done suicide assessment on this patient.

## 2014-11-18 NOTE — Progress Notes (Signed)
Pt rates her depression,"very high ."pt appears very dishelved and tired . She stated she is full of anxiety and is not having a good morning at all. Pt stated she is not sure where she will go when discharged from here but does not want to go back to her apartment. Pt does contract for safety.

## 2014-11-18 NOTE — Progress Notes (Signed)
Patient ID: Randy [name], male   DOB: 12-30-1970, 44 y.o.   MRN: [id number]  Pt currently presents with a flat affect and depressed behavior. Pt remains in bed for the afternoon, states "I'm just tired, I don't feel good." Pt cooperative but maintains minimal interaction with Clinical research associatewriter. Pt provided with medications per providers orders. Pt's vitals were monitored. Pt given a urine specimen cup, pt has not yet returned a sample to this Clinical research associatewriter. Pt supported emotionally and encouraged to express concerns and questions. Pt educated on medications and hydration. Pt currently denies SI/HI and A/V hallucinations. Pt verbally agrees to seek staff if SI/HI or A/VH occurs and to consult with staff before acting on these thoughts.
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
## 2014-11-18 NOTE — Progress Notes (Signed)
D.  Pt in bed upon approach, remains flat, depressed.  Pt remains passively suicidal but contracts for safety while here in the hospital.  Minimal interaction, remains in bed.  Fluids given and encouraged.  Pt has not yet urinated for urine sample but is aware of cup in bathroom and need for collection.  A.  Support and encouragement offered  R.  Pt remains safe on unit, will continue to monitor.

## 2014-11-19 ENCOUNTER — Encounter (HOSPITAL_COMMUNITY): Payer: Self-pay | Admitting: Psychiatry

## 2014-11-19 DIAGNOSIS — F1124 Opioid dependence with opioid-induced mood disorder: Secondary | ICD-10-CM

## 2014-11-19 DIAGNOSIS — F1424 Cocaine dependence with cocaine-induced mood disorder: Secondary | ICD-10-CM

## 2014-11-19 LAB — RAPID URINE DRUG SCREEN, HOSP PERFORMED
Amphetamines: NOT DETECTED
Barbiturates: NOT DETECTED
Benzodiazepines: POSITIVE — AB
COCAINE: POSITIVE — AB
OPIATES: POSITIVE — AB
Tetrahydrocannabinol: NOT DETECTED

## 2014-11-19 LAB — URINALYSIS, ROUTINE W REFLEX MICROSCOPIC
BILIRUBIN URINE: NEGATIVE
Glucose, UA: NEGATIVE mg/dL
HGB URINE DIPSTICK: NEGATIVE
KETONES UR: NEGATIVE mg/dL
Leukocytes, UA: NEGATIVE
Nitrite: NEGATIVE
PROTEIN: NEGATIVE mg/dL
Specific Gravity, Urine: 1.012 (ref 1.005–1.030)
UROBILINOGEN UA: 1 mg/dL (ref 0.0–1.0)
pH: 7.5 (ref 5.0–8.0)

## 2014-11-19 LAB — LIPID PANEL
CHOLESTEROL: 171 mg/dL (ref 0–200)
HDL: 32 mg/dL — ABNORMAL LOW (ref >40)
LDL Cholesterol: 124 mg/dL — ABNORMAL HIGH (ref 0–99)
Total CHOL/HDL Ratio: 5.3 RATIO
Triglycerides: 73 mg/dL (ref <150)
VLDL: 15 mg/dL (ref 0–40)

## 2014-11-19 MED ORDER — NICOTINE 21 MG/24HR TD PT24
21.0000 mg | MEDICATED_PATCH | Freq: Every day | TRANSDERMAL | Status: DC
  Administered 2014-11-19 – 2014-11-26 (×8): 21 mg via TRANSDERMAL
  Filled 2014-11-19 (×10): qty 1

## 2014-11-19 NOTE — [facility] Group Notes (Signed)
[facility] LCSW Aftercare Discharge Planning Group Note  11/19/2014  8:45 AM  Participation Quality: Did Not Attend. Patient invited to participate but declined.   Jeremaih Klima, MSW, LCSWA Clinical Social Worker [facility] [phone]  
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
## 2014-11-19 NOTE — Plan of Care (Signed)
Problem: Diagnosis: Increased Risk For Suicide Attempt Goal: STG-Patient Will Attend All Groups On The Unit Outcome: Not Progressing Pt has not attended group on the unit as of yet

## 2014-11-19 NOTE — Progress Notes (Signed)
Adult Psychoeducational Group Note  Date:  11/19/2014 Time:  10:00 PM  Group Topic/Focus:  Wrap-Up Group:   The focus of this group is to help patients review their daily goal of treatment and discuss progress on daily workbooks.  Participation Level:  Did Not Attend   Additional Comments:  Pt was invited to attend, however stayed in her room in bed.  Caswell CorwinOwen, Francesca Strome C 11/19/2014, 10:00 PM

## 2014-11-19 NOTE — Progress Notes (Signed)
Recreation Therapy Notes  Date: 07.11.16 Time: 9:30 am Location: [address] Group Room  Group Topic: Stress Management  Goal Area(s) Addresses:  Patient will verbalize importance of using healthy stress management.  Patient will identify positive emotions associated with healthy stress management.   Intervention: Stress Management  Activity :  Progressive Muscle Relaxation.  LRT introduced and educated patients on stress management technique of progressive muscle relaxation.  A script was used to deliver both techniques to patients.  Patients were asked to follow the script read a loud by the LRT to engage in practicing the technique.  Education:  Stress Management, Discharge Planning.   Education Outcome: Acknowledges edcuation/In group clarification offered/Needs additional education  Clinical Observations/Feedback: Patient did not attend group.    Elexius Minar, LRT/CTRS         Alitza Cowman A 11/19/2014 3:37 PM 

## 2014-11-19 NOTE — Progress Notes (Signed)
Patient ID: Randy [name], male   DOB: 22-Jun-1970, 44 y.o.   MRN: [id number] D: Client in bed most of the shift, up after group requesting nicotine patch "I hadn't had in days" Client reports depression "8" of 10, goal "depression to get better" A:Writer provided emotional support, encouraged group and fluids. Staff will monitor q2515min for safety. R:Client is safe on the unit, did not attend group.

## 2014-11-19 NOTE — Progress Notes (Signed)
Patient ID: Randy [name], male   DOB: 05-04-1971, 44 y.o.   MRN: [id number] PER STATE REGULATIONS 482.30  THIS CHART WAS REVIEWED FOR MEDICAL NECESSITY WITH RESPECT TO THE PATIENT'S ADMISSION/DURATION OF STAY.  NEXT REVIEW DATE: 11/21/14  Loura HaltBARBARA Nashon Erbes, RN, BSN CASE MANAGER

## 2014-11-19 NOTE — Tx Team (Addendum)
Initial Interdisciplinary Treatment Plan   PATIENT STRESSORS: Financial difficulties Loss of friend Substance abuse Traumatic event   PATIENT STRENGTHS: Average or above average intelligence Motivation for treatment/growth   PROBLEM LIST: Problem List/Patient Goals Date to be addressed Date deferred Reason deferred Estimated date of resolution  Depression 11/17/14     Suicidal Ideation 11/17/14     "I want to find better ways to cope"      Substance abuse                                     DISCHARGE CRITERIA:  Adequate post-discharge living arrangements Improved stabilization in mood, thinking, and/or behavior Motivation to continue treatment in a less acute level of care Need for constant or close observation no longer present Verbal commitment to aftercare and medication compliance Withdrawal symptoms are absent or subacute and managed without 24-hour nursing intervention  PRELIMINARY DISCHARGE PLAN: Attend 12-step recovery group Outpatient therapy  PATIENT/FAMIILY INVOLVEMENT: This treatment plan has been presented to and reviewed with the patient, Randy [name].  The patient and family have been given the opportunity to ask questions and make suggestions.  Juliann ParesBowman, Jaecob Lowden Elizabeth 11/19/2014, 2:09 AM

## 2014-11-19 NOTE — Progress Notes (Signed)
Patient ID: Randy [name], male   DOB: 11-27-1970, 44 y.o.   MRN: [id number]  Pt currently presents with a flat affect and depressed behavior. Pt attended group this afternoon, only for the beginning part. Pt did go to the cafeteria for dinner tonight. Pt states "I'm just sleepy." Pt smiles at Emerson Electricwriter today. Affect seems brighter. Pt denies withdrawal symptoms.   Pt provided with medications per providers orders. Pt's labs and vitals were monitored throughout the day. Pt supported emotionally and encouraged to express concerns and questions. Pt educated on the importance of  Medications, withdrawal and hydration. Pt given a 1:1 today.   Pt's safety ensured with 15 minute and environmental checks. Pt currently denies SI/HI and A/V hallucinations. Pt verbally agrees to seek staff if SI/HI or A/VH occurs and to consult with staff before acting on these thoughts. Will continue POC.

## 2014-11-19 NOTE — Progress Notes (Signed)
[facility] MD Progress Note  11/19/2014 1:34 PM Randy [name]  MRN:  [id number] Subjective:  Randy [name] continues to have a hard time since her best friend killed herself by an OD. After she died she lost her place to stay. Has been staying here and there. Has been using heroin, cocaine. Admits she does not know what to do to get her life back together.  Principal Problem: Major depressive disorder, recurrent, severe without psychotic features Diagnosis:   Patient Active Problem List   Diagnosis Date Noted  . Major depression, recurrent [F33.9] 04/10/2012    Priority: High  . Cocaine dependence [F14.20] 04/10/2012    Priority: High  . Anxiety disorder [F41.9] 08/14/2011    Priority: High  . Cocaine abuse [F14.10] 08/14/2011    Priority: High  . Major depressive disorder, recurrent, severe without psychotic features [F33.2]   . Opioid dependence with opioid-induced mood disorder [F11.24]   . Cocaine dependence with cocaine-induced mood disorder [F14.24]   . MDD (major depressive disorder) [F32.2] 09/17/2014  . MDD (major depressive disorder), recurrent severe, without psychosis [F33.2] 07/05/2014  . Polysubstance abuse including IVDA (heroin), cocaine, marijuana [F19.10] 01/10/2012  . Elevated LFTs [R79.89] 01/10/2012  . Chronic hepatitis C without hepatic coma [B18.2] 01/10/2012  . 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 Tobacco abuse [Z72.0] 01/10/2012  . Recurrent major depression-severe [F33.2] 08/13/2011   Total Time spent with patient: 30 minutes   Past Medical History:  Past Medical History  Diagnosis Date  . Depression   . Hepatitis C   . 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 Cellulitis  Of  Left Forearm 01/10/2012    From IVDA    Past Surgical History  Procedure Laterality Date  . I&d extremity  01/13/2012    Procedure: IRRIGATION AND DEBRIDEMENT EXTREMITY;  Surgeon: Sharmon Revere, MD;  Location: [facility];  Service: Orthopedics;   Laterality: Left;   Family History:  Family History  Problem Relation Age of Onset  . Idiopathic pulmonary fibrosis Father    Social History:  History  Alcohol Use  . 0.0 oz/week    Comment: used once     History  Drug Use  . Yes  . Special: Heroin, "Crack" cocaine    Comment: heroin last used 07/14/14, crack    History   Social History  . Marital Status: Single    Spouse Name: N/A  . Number of Children: N/A  . Years of Education: N/A   Occupational History  . 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 Smoking status: Current Every Day Smoker -- 0.50 packs/day for 20 years    Types: Cigarettes  . Smokeless tobacco: Never Used     Comment: trying to quit-using e-cig some  . Alcohol Use: 0.0 oz/week     Comment: used once  . Drug Use: Yes    Special: Heroin, "Crack" cocaine     Comment: heroin last used 07/14/14, crack  . Sexual Activity:    Partners: Male    Birth Control/ Protection: Condom     Comment: Has been HIV tested in past and has been negative.   Other Topics Concern  . None   Social History Narrative   Transgendered male.  Single.  Lives alone.  Independent.   Additional History:    Sleep: Fair  Appetite:  Poor   Assessment:   Musculoskeletal: Strength & Muscle Tone: within normal limits Gait & Station: normal  Patient leans: normal   Psychiatric Specialty Exam: Physical Exam  Review of Systems  Constitutional: Positive for malaise/fatigue.  HENT: Negative.   Eyes: Negative.   Respiratory: Positive for cough.        Pack a day  Cardiovascular: Negative.   Gastrointestinal: Negative.   Genitourinary: Negative.   Musculoskeletal: Positive for back pain.  Skin: Negative.   Neurological: Negative.   Endo/Heme/Allergies: Negative.   Psychiatric/Behavioral: Positive for depression, suicidal ideas and substance abuse. The patient is nervous/anxious.     Blood pressure 114/79, pulse 96, temperature 98.1 F  (36.7 C), temperature source Oral, resp. rate 16, height '5\' 6"'  (1.676 m), weight 76.658 kg (169 lb), SpO2 99 %.Body mass index is 27.29 kg/(m^2).  General Appearance: Disheveled  Eye Contact::  Minimal  Speech:  Clear and Coherent, Slow and not spontaneous  Volume:  Decreased  Mood:  Anxious and Depressed  Affect:  Restricted  Thought Process:  Coherent and Goal Directed  Orientation:  Full (Time, Place, and Person)  Thought Content:  no spontaneous content,answers questions ,mainly yes no answers  Suicidal Thoughts:  Yes.  without intent/plan  Homicidal Thoughts:  No  Memory:  Immediate;   Fair Recent;   Fair Remote;   Fair  Judgement:  Fair  Insight:  Present and Shallow  Psychomotor Activity:  Decreased  Concentration:  Fair  Recall:  [employer] of Knowledge:Fair  Language: Fair  Akathisia:  No  Handed:  Right  AIMS (if indicated):     Assets:  Desire for Improvement  ADL's:  Intact  Cognition: WNL  Sleep:        Current Medications: Current Facility-Administered Medications  Medication Dose Route Frequency Provider Last Rate Last Dose  . acetaminophen (TYLENOL) tablet 650 mg  650 mg Oral Q6H PRN Kerrie Buffalo, NP      . alum & mag hydroxide-simeth (MAALOX/MYLANTA) 200-200-20 MG/5ML suspension 30 mL  30 mL Oral Q4H PRN Kerrie Buffalo, NP      . ARIPiprazole (ABILIFY) tablet 5 mg  5 mg Oral Daily Shuvon B Rankin, NP   5 mg at 11/19/14 0902  . escitalopram (LEXAPRO) tablet 20 mg  20 mg Oral Daily Shuvon B Rankin, NP   20 mg at 11/19/14 0902  . estradiol (ESTRACE) tablet 2 mg  2 mg Oral TID Shuvon B Rankin, NP   2 mg at 11/19/14 1237  . finasteride (PROSCAR) tablet 5 mg  5 mg Oral Daily Shuvon B Rankin, NP   5 mg at 11/19/14 0902  . hydrOXYzine (ATARAX/VISTARIL) tablet 25 mg  25 mg Oral Q6H PRN Kerrie Buffalo, NP      . loperamide (IMODIUM) capsule 2-4 mg  2-4 mg Oral PRN Kerrie Buffalo, NP      . LORazepam (ATIVAN) tablet 1 mg  1 mg Oral Q6H PRN Kerrie Buffalo, NP       . LORazepam (ATIVAN) tablet 1 mg  1 mg Oral TID Kerrie Buffalo, NP   1 mg at 11/19/14 1237   Followed by  . [START ON 11/20/2014] LORazepam (ATIVAN) tablet 1 mg  1 mg Oral BID Kerrie Buffalo, NP       Followed by  . [START ON 11/21/2014] LORazepam (ATIVAN) tablet 1 mg  1 mg Oral Daily Kerrie Buffalo, NP      . magnesium hydroxide (MILK OF MAGNESIA) suspension 30 mL  30 mL Oral Daily PRN Kerrie Buffalo, NP      . multivitamin with minerals tablet 1 tablet  1  tablet Oral Daily Kerrie Buffalo, NP   1 tablet at 11/19/14 0902  . ondansetron (ZOFRAN-ODT) disintegrating tablet 4 mg  4 mg Oral Q6H PRN Kerrie Buffalo, NP      . spironolactone (ALDACTONE) tablet 50 mg  50 mg Oral Daily Shuvon B Rankin, NP   50 mg at 11/19/14 0902  . thiamine (B-1) injection 100 mg  100 mg Intramuscular Once Kerrie Buffalo, NP   100 mg at 11/17/14 1830  . thiamine (VITAMIN B-1) tablet 100 mg  100 mg Oral Daily Kerrie Buffalo, NP   100 mg at 11/19/14 0902  . traZODone (DESYREL) tablet 50 mg  50 mg Oral QHS PRN Kerrie Buffalo, NP        Lab Results:  Results for orders placed or performed during the hospital encounter of 11/17/14 (from the past 48 hour(s))  Lipid panel     Status: Abnormal   Collection Time: 11/18/14  7:37 PM  Result Value Ref Range   Cholesterol 171 0 - 200 mg/dL   Triglycerides 73 <150 mg/dL   HDL 32 (L) >40 mg/dL   Total CHOL/HDL Ratio 5.3 RATIO   VLDL 15 0 - 40 mg/dL   LDL Cholesterol 124 (H) 0 - 99 mg/dL    Comment:        Total Cholesterol/HDL:CHD Risk Coronary Heart Disease Risk Table                     Men   Women  1/2 Average Risk   3.4   3.3  Average Risk       5.0   4.4  2 X Average Risk   9.6   7.1  3 X Average Risk  23.4   11.0        Use the calculated Patient Ratio above and the CHD Risk Table to determine the patient's CHD Risk.        ATP III CLASSIFICATION (LDL):  <100     mg/dL   Optimal  100-129  mg/dL   Near or Above                    Optimal  130-159  mg/dL    Borderline  160-189  mg/dL   High  >190     mg/dL   Very High Performed at [facility]   CBC with Differential/Platelet     Status: Abnormal   Collection Time: 11/18/14  7:37 PM  Result Value Ref Range   WBC 6.8 4.0 - 10.5 K/uL   RBC 5.26 4.22 - 5.81 MIL/uL   Hemoglobin 16.3 13.0 - 17.0 g/dL   HCT 49.4 39.0 - 52.0 %   MCV 93.9 78.0 - 100.0 fL   MCH 31.0 26.0 - 34.0 pg   MCHC 33.0 30.0 - 36.0 g/dL   RDW 13.6 11.5 - 15.5 %   Platelets 149 (L) 150 - 400 K/uL   Neutrophils Relative % 56 43 - 77 %   Neutro Abs 3.8 1.7 - 7.7 K/uL   Lymphocytes Relative 36 12 - 46 %   Lymphs Abs 2.5 0.7 - 4.0 K/uL   Monocytes Relative 5 3 - 12 %   Monocytes Absolute 0.3 0.1 - 1.0 K/uL   Eosinophils Relative 3 0 - 5 %   Eosinophils Absolute 0.2 0.0 - 0.7 K/uL   Basophils Relative 0 0 - 1 %   Basophils Absolute 0.0 0.0 - 0.1 K/uL    Comment: Performed at [facility]  Comprehensive metabolic panel     Status: Abnormal   Collection Time: 11/18/14  7:37 PM  Result Value Ref Range   Sodium 138 135 - 145 mmol/L   Potassium 4.6 3.5 - 5.1 mmol/L   Chloride 106 101 - 111 mmol/L   CO2 24 22 - 32 mmol/L   Glucose, Bld 93 65 - 99 mg/dL   BUN 21 (H) 6 - 20 mg/dL   Creatinine, Ser 1.19 0.61 - 1.24 mg/dL   Calcium 9.3 8.9 - 10.3 mg/dL   Total Protein 7.6 6.5 - 8.1 g/dL   Albumin 4.1 3.5 - 5.0 g/dL   AST 27 15 - 41 U/L   ALT 42 17 - 63 U/L   Alkaline Phosphatase 56 38 - 126 U/L   Total Bilirubin 0.6 0.3 - 1.2 mg/dL   GFR calc non Af Amer >60 >60 mL/min   GFR calc Af Amer >60 >60 mL/min    Comment: (NOTE) The eGFR has been calculated using the CKD EPI equation. This calculation has not been validated in all clinical situations. eGFR's persistently <60 mL/min signify possible Chronic Kidney Disease.    Anion gap 8 5 - 15    Comment: Performed at [facility]  TSH     Status: Abnormal   Collection Time: 11/18/14  7:37 PM  Result Value Ref Range   TSH 0.304  (L) 0.350 - 4.500 uIU/mL    Comment: Performed at [facility]  Urinalysis, Routine w reflex microscopic (not at [facility])     Status: Abnormal   Collection Time: 11/19/14  6:48 AM  Result Value Ref Range   Color, Urine YELLOW YELLOW   APPearance CLOUDY (A) CLEAR   Specific Gravity, Urine 1.012 1.005 - 1.030   pH 7.5 5.0 - 8.0   Glucose, UA NEGATIVE NEGATIVE mg/dL   Hgb urine dipstick NEGATIVE NEGATIVE   Bilirubin Urine NEGATIVE NEGATIVE   Ketones, ur NEGATIVE NEGATIVE mg/dL   Protein, ur NEGATIVE NEGATIVE mg/dL   Urobilinogen, UA 1.0 0.0 - 1.0 mg/dL   Nitrite NEGATIVE NEGATIVE   Leukocytes, UA NEGATIVE NEGATIVE    Comment: MICROSCOPIC NOT DONE ON URINES WITH NEGATIVE PROTEIN, BLOOD, LEUKOCYTES, NITRITE, OR GLUCOSE <1000 mg/dL. Performed at [facility]   Urine rapid drug screen (hosp performed)     Status: Abnormal   Collection Time: 11/19/14  6:49 AM  Result Value Ref Range   Opiates POSITIVE (A) NONE DETECTED   Cocaine POSITIVE (A) NONE DETECTED   Benzodiazepines POSITIVE (A) NONE DETECTED   Amphetamines NONE DETECTED NONE DETECTED   Tetrahydrocannabinol NONE DETECTED NONE DETECTED   Barbiturates NONE DETECTED NONE DETECTED    Comment:        DRUG SCREEN FOR MEDICAL PURPOSES ONLY.  IF CONFIRMATION IS NEEDED FOR ANY PURPOSE, NOTIFY LAB WITHIN 5 DAYS.        LOWEST DETECTABLE LIMITS FOR URINE DRUG SCREEN Drug Class       Cutoff (ng/mL) Amphetamine      1000 Barbiturate      200 Benzodiazepine   824 Tricyclics       235 Opiates          300 Cocaine          300 THC              50 Performed at [facility]     Physical Findings: AIMS: Facial and Oral Movements Muscles of Facial Expression: None,  normal Lips and Perioral Area: None, normal Jaw: None, normal Tongue: None, normal,Extremity Movements Upper (arms, wrists, hands, fingers): None, normal Lower (legs, knees, ankles, toes): None, normal, Trunk  Movements Neck, shoulders, hips: None, normal, Overall Severity Severity of abnormal movements (highest score from questions above): None, normal Incapacitation due to abnormal movements: None, normal Patient's awareness of abnormal movements (rate only patient's report): No Awareness, Dental Status Current problems with teeth and/or dentures?: No Does patient usually wear dentures?: No  CIWA:  CIWA-Ar Total: 1 COWS:  COWS Total Score: 4  Treatment Plan Summary: Daily contact with patient to assess and evaluate symptoms and progress in treatment and Medication management Supportive approach/coping skills Opioid dependence/assess for S/S of withdrawal and address them Cocaine abuse; reassess for mood instability from coming off the cocaine Major Depression; will continue the Lexapro with Abilify augmentation Consider increasing the Abilify Explore residential treatment options Medical Decision Making:  Review of Psycho-Social Stressors (1), Review or order clinical lab tests (1), Review of Medication Regimen & Side Effects (2) and Review of New Medication or Change in Dosage (2)     Randy [name] A 11/19/2014, 1:34 PM

## 2014-11-19 NOTE — [facility] Suicide Risk Assessment (Signed)
[facility] INPATIENT:  Family/Significant Other Suicide Prevention Education  Suicide Prevention Education:  Patient Refusal for Family/Significant Other Suicide Prevention Education: The patient Randy [name] has refused to provide written consent for family/significant other to be provided Family/Significant Other Suicide Prevention Education during admission and/or prior to discharge.  Physician notified. SPE reviewed with patient and brochure provided. Patient encouraged to return to hospital if having suicidal thoughts, patient verbalized his/her understanding and has no further questions at this time.   Summer Mccolgan, [place] CarboKristin L 11/19/2014, 6:32 PM

## 2014-11-19 NOTE — Plan of Care (Signed)
Problem: Alteration in mood Goal: LTG-Pt's behavior demonstrates decreased signs of depression (Patient's behavior demonstrates decreased signs of depression to the point the patient is safe to return home and continue treatment in an outpatient setting)  Outcome: Not Progressing Pt has remained in bed despite encouragement to get up

## 2014-11-20 LAB — HEMOGLOBIN A1C
Hgb A1c MFr Bld: 5.6 % (ref 4.8–5.6)
Mean Plasma Glucose: 114 mg/dL

## 2014-11-20 MED ORDER — PNEUMOCOCCAL VAC POLYVALENT 25 MCG/0.5ML IJ INJ
0.5000 mL | INJECTION | INTRAMUSCULAR | Status: AC
  Administered 2014-11-21: 0.5 mL via INTRAMUSCULAR

## 2014-11-20 NOTE — Progress Notes (Signed)
Patient ID: Randy [name] File, male   DOB: 06/29/1970, 44 y.o.   MRN: [id number] D: Client in bed most of this shift, reports depression "7" of 10, denies SHI. Client up for a snack, but no interaction with peers noted. A: Writer encouraged group and shower. Staff will monitor q5915min for safety.R:client is safe on the unit, reports "I took a shower this morning"

## 2014-11-20 NOTE — Progress Notes (Signed)
Adult Psychoeducational Group Note  Date:  11/20/2014 Time:  10:23 AM  Group Topic/Focus:  Recovery Goals:   The focus of this group is to identify appropriate goals for recovery and establish a plan to achieve them.  Participation Level:  Did Not Attend  Participation Quality:  na  Affect:  na  Cognitive:  na  Insight: None  Engagement in Group:  na  Modes of Intervention:  na  Additional Comments:   Did not feel well and stayed in bed.  Genia DelBatchelor, Diane C 11/20/2014, 10:23 AM

## 2014-11-20 NOTE — Tx Team (Signed)
Interdisciplinary Treatment Plan Update (Adult) Date: 11/20/2014    Time Reviewed: 9:30 AM  Progress in Treatment: Attending groups: Continuing to assess, patient new to milieu Participating in groups: Continuing to assess, patient new to milieu Taking medication as prescribed: Yes Tolerating medication: Yes Family/Significant other contact made: No, patient has declined collateral contact Patient understands diagnosis: Yes Discussing patient identified problems/goals with staff: Yes Medical problems stabilized or resolved: Yes Denies suicidal/homicidal ideation: Yes Issues/concerns per patient self-inventory: Yes Other:  New problem(s) identified: N/A  Discharge Plan or Barriers: 11/20/2014:  CSW continuing to assess, patient new to milieu.  Reason for Continuation of Hospitalization:  Depression Anxiety Medication Stabilization   Comments: N/A  Estimated length of stay: 3-5 days  For review of initial/current patient goals, please see plan of care. Randy [name] is a 44 year old male, Caucasian, male transitioning to male reporting increased depression and SI with a plan to overdose.Stressors include homelessness, drug use, and recent death of friend. She is requesting referral to [facility] at this time. She will benefit from crisis stabilization, evaluation for medication, psycho-education groups for coping skills development, group therapy and case management for discharge planning.   Attendees: Patient:    Family:    Physician: Dr. Jama Flavorsobos; Dr. Dub MikesLugo 11/20/2014 9:30 AM  Nursing: Kathi SimpersSarah Twyman, Manuela SchwartzJennifer Pritchett , RN 11/20/2014 9:30 AM  Clinical Social Worker: Samuella BruinKristin Herschell Virani,  LCSWA 11/20/2014 9:30 AM  Other: Chad CordialLauren Carter, LCSWA 11/20/2014 9:30 AM  Other: Leisa LenzValerie Enoch, Vesta [facility]er[facility] Liaison 11/20/2014 9:30 AM  Other: Onnie BoerJennifer Clark, Case Manager 11/20/2014 9:30 AM  Other: Serena ColonelAggie Nwoko, NP 11/20/2014 9:30 AM  Other:    Other:    Other:    Other:    Other:       Scribe for  Treatment Team:  Samuella BruinKristin Italia Wolfert, MSW, [employer]CSWA [phone]

## 2014-11-20 NOTE — Clinical Social Work Note (Signed)
Referral faxed to [facility] at patient's request. Patient also provided information on [facility] program.   Samuella BruinKristin Roshanda Balazs, MSW, [facility]CSWA Clinical Social Worker [facility] [phone]

## 2014-11-20 NOTE — Progress Notes (Signed)
NSG shift assessment. 7a-7p.   D: Affect blunted, mood depressed, behavior guarded, appearance disheveled and unkempt. Pt did not attend the morning nurse education group because she did not feel well and stayed in bed. Her sleep was fair last night and her appetite is poor. She rates her depression as 7/10 and feelings of hopelessness as 7/10. Her anxiety is also 7/10.  Pt is withdrawing from alcohol, and she reports cravings, sedation, chilling, and irritability. Sometimes she has thoughts of hurting herself, but is able to contract for safety. Her goal today is, "To get out of bed", and she wrote that she achieved that goal by, "going to breakfast."  She also got out of bed to get her 5 pm medications and she went to the dining room.  A: Observed pt interacting in group and in the milieu: Support and encouragement offered. Safety maintained with observations every 15 minutes.   R:   Contracts for safety and continues to follow the treatment plan, working on learning new coping skills.

## 2014-11-20 NOTE — Progress Notes (Signed)
[facility] MD Progress Note  11/20/2014 7:29 PM Randy [name]  MRN:  [id number] Subjective:  Randy [name] continues to have a hard time. Continues to feel very depressed. States she had to let go of the apartment where she was staying at as Randy [name] was sleeping in the apartment when she OD. State she could no be there in the same place she died. She is still having SI on and off. Would not act on them.  Principal Problem: Major depressive disorder, recurrent, severe without psychotic features Diagnosis:   Patient Active Problem List   Diagnosis Date Noted  . Major depression, recurrent [F33.9] 04/10/2012    Priority: High  . Cocaine dependence [F14.20] 04/10/2012    Priority: High  . Anxiety disorder [F41.9] 08/14/2011    Priority: High  . Major depressive disorder, recurrent, severe without psychotic features [F33.2]   . Opioid dependence with opioid-induced mood disorder [F11.24]   . Cocaine dependence with cocaine-induced mood disorder [F14.24]   . MDD (major depressive disorder) [F32.2] 09/17/2014  . MDD (major depressive disorder), recurrent severe, without psychosis [F33.2] 07/05/2014  . Polysubstance abuse including IVDA (heroin), cocaine, marijuana [F19.10] 01/10/2012  . Elevated LFTs [R79.89] 01/10/2012  . Chronic hepatitis C without hepatic coma [B18.2] 01/10/2012  . 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 Tobacco abuse [Z72.0] 01/10/2012  . Recurrent major depression-severe [F33.2] 08/13/2011   Total Time spent with patient: 30 minutes   Past Medical History:  Past Medical History  Diagnosis Date  . Depression   . Hepatitis C   . 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 Cellulitis  Of  Left Forearm 01/10/2012    From IVDA    Past Surgical History  Procedure Laterality Date  . I&d extremity  01/13/2012    Procedure: IRRIGATION AND DEBRIDEMENT EXTREMITY;  Surgeon: Sharmon Revere, MD;  Location: [facility];  Service: Orthopedics;  Laterality: Left;    Family History:  Family History  Problem Relation Age of Onset  . Idiopathic pulmonary fibrosis Father    Social History:  History  Alcohol Use  . 0.0 oz/week    Comment: used once     History  Drug Use  . Yes  . Special: Heroin, "Crack" cocaine    Comment: heroin last used 07/14/14, crack    History   Social History  . Marital Status: Single    Spouse Name: N/A  . Number of Children: N/A  . Years of Education: N/A   Occupational History  . 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 Smoking status: Current Every Day Smoker -- 0.50 packs/day for 20 years    Types: Cigarettes  . Smokeless tobacco: Never Used     Comment: trying to quit-using e-cig some  . Alcohol Use: 0.0 oz/week     Comment: used once  . Drug Use: Yes    Special: Heroin, "Crack" cocaine     Comment: heroin last used 07/14/14, crack  . Sexual Activity:    Partners: Male    Birth Control/ Protection: Condom     Comment: Has been HIV tested in past and has been negative.   Other Topics Concern  . None   Social History Narrative   Transgendered male.  Single.  Lives alone.  Independent.   Additional History:    Sleep: Poor  Appetite:  Poor   Assessment:   Musculoskeletal: Strength & Muscle Tone: within normal limits Gait & Station: normal  Patient leans: normal   Psychiatric Specialty Exam: Physical Exam  Review of Systems  Constitutional: Positive for malaise/fatigue.  Eyes: Negative.   Cardiovascular: Negative.   Gastrointestinal: Negative.   Genitourinary: Negative.   Musculoskeletal: Negative.   Skin: Negative.   Neurological: Positive for weakness and headaches.  Endo/Heme/Allergies: Negative.   Psychiatric/Behavioral: Positive for depression and suicidal ideas. The patient is nervous/anxious and has insomnia.     Blood pressure 108/64, pulse 76, temperature 98.2 F (36.8 C), temperature source Oral, resp. rate 16, height '5\' 6"'  (1.676 m),  weight 76.658 kg (169 lb), SpO2 99 %.Body mass index is 27.29 kg/(m^2).  General Appearance: Disheveled  Eye Sport and exercise psychologist::  Fair  Speech:  Blocked, Slow and not spontaneous  Volume:  Decreased  Mood:  Anxious and Depressed  Affect:  Depressed  Thought Process:  Coherent and Goal Directed  Orientation:  Full (Time, Place, and Person)  Thought Content:  symptoms events worries concerns  Suicidal Thoughts:  on and off but able to contract for safety  Homicidal Thoughts:  No  Memory:  Immediate;   Fair Recent;   Fair Remote;   Fair  Judgement:  Fair  Insight:  Present and Shallow  Psychomotor Activity:  Decreased  Concentration:  Fair  Recall:  [employer] of Knowledge:Fair  Language: Fair  Akathisia:  No  Handed:  Right  AIMS (if indicated):     Assets:  Desire for Improvement  ADL's:  Intact  Cognition: WNL  Sleep:  Number of Hours: 1.75     Current Medications: Current Facility-Administered Medications  Medication Dose Route Frequency Provider Last Rate Last Dose  . acetaminophen (TYLENOL) tablet 650 mg  650 mg Oral Q6H PRN Kerrie Buffalo, NP   650 mg at 11/20/14 0314  . alum & mag hydroxide-simeth (MAALOX/MYLANTA) 200-200-20 MG/5ML suspension 30 mL  30 mL Oral Q4H PRN Kerrie Buffalo, NP      . ARIPiprazole (ABILIFY) tablet 5 mg  5 mg Oral Daily Shuvon B Rankin, NP   5 mg at 11/20/14 0801  . escitalopram (LEXAPRO) tablet 20 mg  20 mg Oral Daily Shuvon B Rankin, NP   20 mg at 11/20/14 0801  . estradiol (ESTRACE) tablet 2 mg  2 mg Oral TID Shuvon B Rankin, NP   2 mg at 11/20/14 1717  . finasteride (PROSCAR) tablet 5 mg  5 mg Oral Daily Shuvon B Rankin, NP   5 mg at 11/20/14 0800  . [START ON 11/21/2014] LORazepam (ATIVAN) tablet 1 mg  1 mg Oral Daily Kerrie Buffalo, NP      . magnesium hydroxide (MILK OF MAGNESIA) suspension 30 mL  30 mL Oral Daily PRN Kerrie Buffalo, NP      . multivitamin with minerals tablet 1 tablet  1 tablet Oral Daily Kerrie Buffalo, NP   1 tablet at 11/20/14  0800  . nicotine (NICODERM CQ - dosed in mg/24 hours) patch 21 mg  21 mg Transdermal Daily Nicholaus Bloom, MD   21 mg at 11/20/14 1716  . [START ON 11/21/2014] pneumococcal 23 valent vaccine (PNU-IMMUNE) injection 0.5 mL  0.5 mL Intramuscular Tomorrow-1000 Nicholaus Bloom, MD      . spironolactone (ALDACTONE) tablet 50 mg  50 mg Oral Daily Shuvon B Rankin, NP   50 mg at 11/20/14 0801  . thiamine (B-1) injection 100 mg  100 mg Intramuscular Once Kerrie Buffalo, NP   100 mg at 11/17/14 1830  . thiamine (VITAMIN B-1) tablet 100 mg  100 mg  Oral Daily Kerrie Buffalo, NP   100 mg at 11/20/14 0800  . traZODone (DESYREL) tablet 50 mg  50 mg Oral QHS PRN Kerrie Buffalo, NP        Lab Results:  Results for orders placed or performed during the hospital encounter of 11/17/14 (from the past 48 hour(s))  Lipid panel     Status: Abnormal   Collection Time: 11/18/14  7:37 PM  Result Value Ref Range   Cholesterol 171 0 - 200 mg/dL   Triglycerides 73 <150 mg/dL   HDL 32 (L) >40 mg/dL   Total CHOL/HDL Ratio 5.3 RATIO   VLDL 15 0 - 40 mg/dL   LDL Cholesterol 124 (H) 0 - 99 mg/dL    Comment:        Total Cholesterol/HDL:CHD Risk Coronary Heart Disease Risk Table                     Men   Women  1/2 Average Risk   3.4   3.3  Average Risk       5.0   4.4  2 X Average Risk   9.6   7.1  3 X Average Risk  23.4   11.0        Use the calculated Patient Ratio above and the CHD Risk Table to determine the patient's CHD Risk.        ATP III CLASSIFICATION (LDL):  <100     mg/dL   Optimal  100-129  mg/dL   Near or Above                    Optimal  130-159  mg/dL   Borderline  160-189  mg/dL   High  >190     mg/dL   Very High Performed at [facility]   CBC with Differential/Platelet     Status: Abnormal   Collection Time: 11/18/14  7:37 PM  Result Value Ref Range   WBC 6.8 4.0 - 10.5 K/uL   RBC 5.26 4.22 - 5.81 MIL/uL   Hemoglobin 16.3 13.0 - 17.0 g/dL   HCT 49.4 39.0 - 52.0 %   MCV 93.9 78.0 -  100.0 fL   MCH 31.0 26.0 - 34.0 pg   MCHC 33.0 30.0 - 36.0 g/dL   RDW 13.6 11.5 - 15.5 %   Platelets 149 (L) 150 - 400 K/uL   Neutrophils Relative % 56 43 - 77 %   Neutro Abs 3.8 1.7 - 7.7 K/uL   Lymphocytes Relative 36 12 - 46 %   Lymphs Abs 2.5 0.7 - 4.0 K/uL   Monocytes Relative 5 3 - 12 %   Monocytes Absolute 0.3 0.1 - 1.0 K/uL   Eosinophils Relative 3 0 - 5 %   Eosinophils Absolute 0.2 0.0 - 0.7 K/uL   Basophils Relative 0 0 - 1 %   Basophils Absolute 0.0 0.0 - 0.1 K/uL    Comment: Performed at [facility]  Comprehensive metabolic panel     Status: Abnormal   Collection Time: 11/18/14  7:37 PM  Result Value Ref Range   Sodium 138 135 - 145 mmol/L   Potassium 4.6 3.5 - 5.1 mmol/L   Chloride 106 101 - 111 mmol/L   CO2 24 22 - 32 mmol/L   Glucose, Bld 93 65 - 99 mg/dL   BUN 21 (H) 6 - 20 mg/dL   Creatinine, Ser 1.19 0.61 - 1.24 mg/dL   Calcium 9.3 8.9 -  10.3 mg/dL   Total Protein 7.6 6.5 - 8.1 g/dL   Albumin 4.1 3.5 - 5.0 g/dL   AST 27 15 - 41 U/L   ALT 42 17 - 63 U/L   Alkaline Phosphatase 56 38 - 126 U/L   Total Bilirubin 0.6 0.3 - 1.2 mg/dL   GFR calc non Af Amer >60 >60 mL/min   GFR calc Af Amer >60 >60 mL/min    Comment: (NOTE) The eGFR has been calculated using the CKD EPI equation. This calculation has not been validated in all clinical situations. eGFR's persistently <60 mL/min signify possible Chronic Kidney Disease.    Anion gap 8 5 - 15    Comment: Performed at [facility]  TSH     Status: Abnormal   Collection Time: 11/18/14  7:37 PM  Result Value Ref Range   TSH 0.304 (L) 0.350 - 4.500 uIU/mL    Comment: Performed at [facility]  Hemoglobin A1c     Status: None   Collection Time: 11/18/14  7:37 PM  Result Value Ref Range   Hgb A1c MFr Bld 5.6 4.8 - 5.6 %    Comment: (NOTE)         Pre-diabetes: 5.7 - 6.4         Diabetes: >6.4         Glycemic control for adults with diabetes: <7.0    Mean  Plasma Glucose 114 mg/dL    Comment: (NOTE) Performed At: [facility] [place], [place] [id number] Lindon Romp MD [id number] Performed at [facility]   Urinalysis, Routine w reflex microscopic (not at [facility])     Status: Abnormal   Collection Time: 11/19/14  6:48 AM  Result Value Ref Range   Color, Urine YELLOW YELLOW   APPearance CLOUDY (A) CLEAR   Specific Gravity, Urine 1.012 1.005 - 1.030   pH 7.5 5.0 - 8.0   Glucose, UA NEGATIVE NEGATIVE mg/dL   Hgb urine dipstick NEGATIVE NEGATIVE   Bilirubin Urine NEGATIVE NEGATIVE   Ketones, ur NEGATIVE NEGATIVE mg/dL   Protein, ur NEGATIVE NEGATIVE mg/dL   Urobilinogen, UA 1.0 0.0 - 1.0 mg/dL   Nitrite NEGATIVE NEGATIVE   Leukocytes, UA NEGATIVE NEGATIVE    Comment: MICROSCOPIC NOT DONE ON URINES WITH NEGATIVE PROTEIN, BLOOD, LEUKOCYTES, NITRITE, OR GLUCOSE <1000 mg/dL. Performed at [facility]   Urine rapid drug screen (hosp performed)     Status: Abnormal   Collection Time: 11/19/14  6:49 AM  Result Value Ref Range   Opiates POSITIVE (A) NONE DETECTED   Cocaine POSITIVE (A) NONE DETECTED   Benzodiazepines POSITIVE (A) NONE DETECTED   Amphetamines NONE DETECTED NONE DETECTED   Tetrahydrocannabinol NONE DETECTED NONE DETECTED   Barbiturates NONE DETECTED NONE DETECTED    Comment:        DRUG SCREEN FOR MEDICAL PURPOSES ONLY.  IF CONFIRMATION IS NEEDED FOR ANY PURPOSE, NOTIFY LAB WITHIN 5 DAYS.        LOWEST DETECTABLE LIMITS FOR URINE DRUG SCREEN Drug Class       Cutoff (ng/mL) Amphetamine      1000 Barbiturate      200 Benzodiazepine   497 Tricyclics       530 Opiates          300 Cocaine          300 THC              50 Performed at  [facility]     Physical Findings: AIMS: Facial and Oral Movements Muscles of Facial Expression: None, normal Lips and Perioral Area: None, normal Jaw: None, normal Tongue: None, normal,Extremity  Movements Upper (arms, wrists, hands, fingers): None, normal Lower (legs, knees, ankles, toes): None, normal, Trunk Movements Neck, shoulders, hips: None, normal, Overall Severity Severity of abnormal movements (highest score from questions above): None, normal Incapacitation due to abnormal movements: None, normal Patient's awareness of abnormal movements (rate only patient's report): No Awareness, Dental Status Current problems with teeth and/or dentures?: No Does patient usually wear dentures?: No  CIWA:  CIWA-Ar Total: 4 COWS:  COWS Total Score: 4  Treatment Plan Summary: Daily contact with patient to assess and evaluate symptoms and progress in treatment and Medication management Supportive approach/coping skills Heroin dependence; continue to monitor for withdrawal and address Cocaine dependence; continue to monitor the withdrawal and address Major Depression; continue the Lexapro at 20 mg, the Abilify at 5 mg Insomnia;will increase the Trazodone to 100 mg HS PRN sleep Work a relapse prevention plan Explore residential treatment programs "trans gender friendly" as per [place] Making:  Review of Psycho-Social Stressors (1), Review of Medication Regimen & Side Effects (2) and Review of New Medication or Change in Dosage (2)     Daniqua Campoy A 11/20/2014, 7:29 PM

## 2014-11-20 NOTE — [facility] Group Notes (Signed)
[facility] LCSW Group Therapy 11/20/2014  1:15 PM   Type of Therapy: Group Therapy  Participation Level: Did Not Attend. Patient invited to participate but declined.   Nicolo Tomko, MSW, LCSWA Clinical Social Worker [facility] [phone]   

## 2014-11-20 NOTE — Progress Notes (Signed)
Recreation Therapy Notes  Animal-Assisted Activity (AAA) Program Checklist/Progress Notes Patient Eligibility Criteria Checklist & Daily Group note for Rec Tx Intervention  Date: 07.12.16 Time: 2:45 pm Location: [address] Dayroom   AAA/T Program Assumption of Risk Form signed by Patient/ or Parent Legal Guardian yes  Patient is free of allergies or sever asthma yes  Patient reports no fear of animals yes  Patient reports no history of cruelty to animalsyes  Patient understands his/her participation is voluntary yes  Patient washes hands before animal contact yes  Patient washes hands after animal contact yes  Education: Hand Washing, Appropriate Animal Interaction   Education Outcome: Acknowledges understanding/In group clarification offered/Needs additional education.   Clinical Observations/Feedback:  Patient did not attend group.   Navina Wohlers, LRT/CTRS         Fahd Galea A 11/20/2014 4:02 PM 

## 2014-11-21 DIAGNOSIS — F332 Major depressive disorder, recurrent severe without psychotic features: Secondary | ICD-10-CM | POA: Diagnosis not present

## 2014-11-21 MED ORDER — TRAZODONE HCL 100 MG PO TABS
100.0000 mg | ORAL_TABLET | Freq: Every evening | ORAL | Status: DC | PRN
  Administered 2014-11-22 – 2014-11-25 (×4): 100 mg via ORAL
  Filled 2014-11-21 (×4): qty 1
  Filled 2014-11-21: qty 3
  Filled 2014-11-21: qty 1

## 2014-11-21 MED ORDER — LORAZEPAM 1 MG PO TABS
1.0000 mg | ORAL_TABLET | Freq: Four times a day (QID) | ORAL | Status: DC | PRN
  Administered 2014-11-21 – 2014-11-26 (×11): 1 mg via ORAL
  Filled 2014-11-21 (×11): qty 1

## 2014-11-21 NOTE — Progress Notes (Signed)
Patient ID: Randy [name], male   DOB: [birth year], 44 y.o.   MRN: [id number] PER STATE REGULATIONS 482.30  THIS CHART WAS REVIEWED FOR MEDICAL NECESSITY WITH RESPECT TO THE PATIENT'S ADMISSION/ DURATION OF STAY.  NEXT REVIEW DATE: 11/25/2014  [name] [name], RN, BSN CASE MANAGER

## 2014-11-21 NOTE — Clinical Social Work Note (Signed)
[facility] until possibly Friday [facility]/15. CSW will continue to follow.  Samuella BruinKristin Tacuma Graffam, MSW, [employer] Clinical Social Worker [facility] [phone]

## 2014-11-21 NOTE — Progress Notes (Signed)
D: Randy [name] has been calm and appropriate on the unit. She admits passive SI but contracts for safety. She denies HI and AVH. She began feeling generalized discomfort in early afternoon. She attended one group but remained isolative. On her self-inventory, she rated last night's sleep and today's concentration as poor. She also rated her depression an 8, hopelessness and 8, and anxiety and 6.  A: Meds given as ordered, including PRN Tylenol. Q15 safety checks maintained. Support/encouragement offered. R: Pt remains free from harm and continues with treatment. Will continue to monitor for needs/safety.

## 2014-11-21 NOTE — [facility] Group Notes (Signed)
[facility] LCSW Group Therapy 11/21/2014  1:15 PM   Type of Therapy: Group Therapy  Participation Level: Did Not Attend. Patient invited to participate but declined.   Yareli Carthen, MSW, LCSWA Clinical Social Worker [facility] [phone]   

## 2014-11-21 NOTE — [facility] Group Notes (Signed)
   [facility]  11/21/2014  8:45 AM   Participation Quality: Alert, Appropriate and Oriented  Mood/Affect: Depressed and Flat  Depression Rating: 6  Anxiety Rating: 6  Thoughts of Suicide: Pt endorses passive SI but contracts for safety  Will you contract for safety? Yes  Current AVH: Pt denies  Plan for Discharge/Comments: Pt attended discharge planning group and actively participated in group. CSW provided pt with today's workbook. Patient appeared with improved and brighter affect today, was seen smiling in group. [facility]  Transportation Means: Pt reports access to transportation  Supports: No supports mentioned at this time  Samuella BruinKristin Kunal Levario, MSW, [employer] Clinical Social Worker [facility] [phone]

## 2014-11-21 NOTE — Progress Notes (Signed)
[facility] MD Progress Note  11/21/2014 6:47 PM Randy [name]  MRN:  [id number] Subjective:  Randy [name] continues to have a hard time. States she is still trying to block memories of Beth but at times not successful. Continues to endorse a sense of hopelessness helplessness. States that she does not know why she continues to use as states the high is not even good anymore.  Principal Problem: Major depressive disorder, recurrent, severe without psychotic features Diagnosis:   Patient Active Problem List   Diagnosis Date Noted  . Major depression, recurrent [F33.9] 04/10/2012    Priority: High  . Cocaine dependence [F14.20] 04/10/2012    Priority: High  . Anxiety disorder [F41.9] 08/14/2011    Priority: High  . Major depressive disorder, recurrent, severe without psychotic features [F33.2]   . Opioid dependence with opioid-induced mood disorder [F11.24]   . Cocaine dependence with cocaine-induced mood disorder [F14.24]   . MDD (major depressive disorder) [F32.2] 09/17/2014  . MDD (major depressive disorder), recurrent severe, without psychosis [F33.2] 07/05/2014  . Polysubstance abuse including IVDA (heroin), cocaine, marijuana [F19.10] 01/10/2012  . Elevated LFTs [R79.89] 01/10/2012  . Chronic hepatitis C without hepatic coma [B18.2] 01/10/2012  . 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 Tobacco abuse [Z72.0] 01/10/2012  . Recurrent major depression-severe [F33.2] 08/13/2011   Total Time spent with patient: 30 minutes   Past Medical History:  Past Medical History  Diagnosis Date  . Depression   . Hepatitis C   . 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 Cellulitis  Of  Left Forearm 01/10/2012    From IVDA    Past Surgical History  Procedure Laterality Date  . I&d extremity  01/13/2012    Procedure: IRRIGATION AND DEBRIDEMENT EXTREMITY;  Surgeon: Kennieth Rad, MD;  Location: [facility];  Service: Orthopedics;  Laterality: Left;   Family History:   Family History  Problem Relation Age of Onset  . Idiopathic pulmonary fibrosis Father    Social History:  History  Alcohol Use  . 0.0 oz/week    Comment: used once     History  Drug Use  . Yes  . Special: Heroin, "Crack" cocaine    Comment: heroin last used 07/14/14, crack    History   Social History  . Marital Status: Single    Spouse Name: N/A  . Number of Children: N/A  . Years of Education: N/A   Occupational History  . 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 Smoking status: Current Every Day Smoker -- 0.50 packs/day for 20 years    Types: Cigarettes  . Smokeless tobacco: Never Used     Comment: trying to quit-using e-cig some  . Alcohol Use: 0.0 oz/week     Comment: used once  . Drug Use: Yes    Special: Heroin, "Crack" cocaine     Comment: heroin last used 07/14/14, crack  . Sexual Activity:    Partners: Male    Birth Control/ Protection: Condom     Comment: Has been HIV tested in past and has been negative.   Other Topics Concern  . None   Social History Narrative   Transgendered male.  Single.  Lives alone.  Independent.   Additional History:    Sleep: Poor  Appetite:  Poor   Assessment:   Musculoskeletal: Strength & Muscle Tone: within normal limits Gait & Station: normal Patient leans: normal   Psychiatric Specialty Exam: Physical Exam  Review of Systems  Constitutional: Positive for malaise/fatigue.  HENT: Negative.   Eyes: Negative.   Respiratory: Negative.   Cardiovascular: Negative.   Gastrointestinal: Negative.   Genitourinary: Negative.   Musculoskeletal: Negative.   Skin: Negative.   Neurological: Positive for weakness.  Endo/Heme/Allergies: Negative.   Psychiatric/Behavioral: Positive for depression, suicidal ideas and substance abuse. The patient is nervous/anxious and has insomnia.     Blood pressure 97/48, pulse 60, temperature 98.3 F (36.8 C), temperature source Oral, resp. rate 18,  height 5\' 6"  (1.676 m), weight 76.658 kg (169 lb), SpO2 99 %.Body mass index is 27.29 kg/(m^2).  General Appearance: Fairly Groomed  Patent attorneyye Contact::  Fair  Speech:  Clear and Coherent  Volume:  Decreased  Mood:  Anxious and Depressed  Affect:  anxious worried depressed at times tearful  Thought Process:  Coherent and Goal Directed  Orientation:  Full (Time, Place, and Person)  Thought Content:  symptoms events worries concerns  Suicidal Thoughts:  No  Homicidal Thoughts:  No  Memory:  Immediate;   Fair Recent;   Fair Remote;   Fair  Judgement:  Fair  Insight:  Present  Psychomotor Activity:  Restlessness  Concentration:  Fair  Recall:  [employer]iservFair  Fund of Knowledge:Fair  Language: Fair  Akathisia:  No  Handed:  Right  AIMS (if indicated):     Assets:  Desire for Improvement  ADL's:  Intact  Cognition: WNL  Sleep:  Number of Hours: 0.75     Current Medications: Current Facility-Administered Medications  Medication Dose Route Frequency Provider Last Rate Last Dose  . acetaminophen (TYLENOL) tablet 650 mg  650 mg Oral Q6H PRN Adonis BrookSheila Agustin, NP   650 mg at 11/21/14 1305  . alum & mag hydroxide-simeth (MAALOX/MYLANTA) 200-200-20 MG/5ML suspension 30 mL  30 mL Oral Q4H PRN Adonis BrookSheila Agustin, NP      . ARIPiprazole (ABILIFY) tablet 5 mg  5 mg Oral Daily Shuvon B Rankin, NP   5 mg at 11/21/14 0847  . escitalopram (LEXAPRO) tablet 20 mg  20 mg Oral Daily Shuvon B Rankin, NP   20 mg at 11/21/14 0847  . estradiol (ESTRACE) tablet 2 mg  2 mg Oral TID Shuvon B Rankin, NP   2 mg at 11/21/14 1622  . finasteride (PROSCAR) tablet 5 mg  5 mg Oral Daily Shuvon B Rankin, NP   5 mg at 11/21/14 0847  . LORazepam (ATIVAN) tablet 1 mg  1 mg Oral Q6H PRN [name] FeeIrving A Allegra Cerniglia, MD   1 mg at 11/21/14 1835  . magnesium hydroxide (MILK OF MAGNESIA) suspension 30 mL  30 mL Oral Daily PRN Adonis BrookSheila Agustin, NP      . multivitamin with minerals tablet 1 tablet  1 tablet Oral Daily Adonis BrookSheila Agustin, NP   1 tablet at 11/21/14  0847  . nicotine (NICODERM CQ - dosed in mg/24 hours) patch 21 mg  21 mg Transdermal Daily [name] FeeIrving A Anisia Leija, MD   21 mg at 11/21/14 0800  . spironolactone (ALDACTONE) tablet 50 mg  50 mg Oral Daily Shuvon B Rankin, NP   50 mg at 11/21/14 0847  . thiamine (B-1) injection 100 mg  100 mg Intramuscular Once Adonis BrookSheila Agustin, NP   100 mg at 11/17/14 1830  . thiamine (VITAMIN B-1) tablet 100 mg  100 mg Oral Daily Adonis BrookSheila Agustin, NP   100 mg at 11/21/14 0846  . traZODone (DESYREL) tablet 100 mg  100 mg Oral QHS PRN [name] FeeIrving A Lexander Tremblay, MD  Lab Results: No results found for this or any previous visit (from the past 48 hour(s)).  Physical Findings: AIMS: Facial and Oral Movements Muscles of Facial Expression: None, normal Lips and Perioral Area: None, normal Jaw: None, normal Tongue: None, normal,Extremity Movements Upper (arms, wrists, hands, fingers): None, normal Lower (legs, knees, ankles, toes): None, normal, Trunk Movements Neck, shoulders, hips: None, normal, Overall Severity Severity of abnormal movements (highest score from questions above): None, normal Incapacitation due to abnormal movements: None, normal Patient's awareness of abnormal movements (rate only patient's report): No Awareness, Dental Status Current problems with teeth and/or dentures?: No Does patient usually wear dentures?: No  CIWA:  CIWA-Ar Total: 1 COWS:  COWS Total Score: 4  Treatment Plan Summary: Daily contact with patient to assess and evaluate symptoms and progress in treatment and Medication management Supportive approach/coping skills Opioid dependence; clonidine detox protocol Anxiety; will extend the Ativan 1 mg Q 8 H PRN anxiety Depression; will continue the Lexapro at 20 mg daily/Abilify 5 mg daily Will work with grief and loss CBT/mindfulness Medical Decision Making:  Review of Psycho-Social Stressors (1), Review of Medication Regimen & Side Effects (2) and Review of New Medication or Change in Dosage  (2)     Ritik Stavola A 11/21/2014, 6:47 PM

## 2014-11-21 NOTE — Progress Notes (Signed)
Recreation Therapy Notes  Date: 07.13.16 Time: 9:30 am Location: [address] Dayroom  Group Topic: Stress Management  Goal Area(s) Addresses:  Patient will verbalize importance of using healthy stress management.  Patient will identify positive emotions associated with healthy stress management.   Intervention: Stress Management  Activity :  Guided Imagery.  LRT introduced and explained the stress management technique of guided imagery.  LRT used [name] script to deliver the technique.  Patients were asked to follow the scripts read [name] loud by the LRT to participate in the stress management technique.  Education:  Stress Management, Discharge Planning.   Education Outcome: Acknowledges edcuation/In group clarification offered/Needs additional education  Clinical Observations/Feedback: Patient did not attend group.   Randy [name], LRT/CTRS         Randy [name] 11/21/2014 1:37 PM 

## 2014-11-21 NOTE — Progress Notes (Signed)
Pt did not attend group this evening.  

## 2014-11-22 NOTE — Progress Notes (Signed)
Pt reports she is feeling better although she is still depressed and sad over the accidentally death of her friend Buyer, retailBeth.  She says she wants to find another place to live because it is too hard to return to the place where [place] died.  She says she still has thoughts to kill herself, but could not bring herself to do it.  She denies HI/AVH.  She is tolerating her medications well.  She makes her needs known to staff.  She keeps to herself most of the time and has minimal interaction with her peers.  Support and encouragement offered.  Safety maintained with q15 minute checks.

## 2014-11-22 NOTE — Progress Notes (Signed)
Patient ID: Randy [name], male   DOB: [birth year], 44 y.o.   MRN: [id number] D: Client isolates this shift, only up for medications and BR. Client reports anxiety "6" of 10. A: Writer provides emotional support encourages client to report any concerns. Staff will monitor q315min for safety. Data processing managerWriter encouraged karaoke. R: Client is safe on the unit, did not attend karaoke.

## 2014-11-22 NOTE — Progress Notes (Signed)
D: Pt presents with flat affect and depressed mood. Pt rates depression 5/10. Hopeless 6/10. Anxiety 5/10. Pt reports withdrawal symptoms of chills, agitation and cravings. Pt denies suicidal thoughts. Pt contracts not to harm self. Pt have minimal interaction on the unit and forwards little information. Pt appears disheveled. Pt reports fair sleep last night. Pt compliant with taking meds. No adverse reaction to meds verbalized my pt.  A: Medications administered as ordered per MD. Verbal support given. Pt encouraged to attend groups. 15 minute checks performed for safety.  R: Pt receptive to treatment. Pt safety maintained.

## 2014-11-22 NOTE — Progress Notes (Signed)
[facility] MD Progress Note  11/22/2014 8:31 PM Randy [name]  MRN:  [id number] Subjective:  Randy [name] states that she is still having a hard time. She feels lost not knowing what to do. She lost her best friend to an OD. Randy [name] states that she is wanting to go to a residential treatment program. Worried about how she will be received being trans gender. She slept better on the higher dose of Trazodone. Feels the other medications are working as well as they can Principal Problem: Major depressive disorder, recurrent, severe without psychotic features Diagnosis:   Patient Active Problem List   Diagnosis Date Noted  . Major depression, recurrent [F33.9] 04/10/2012    Priority: High  . Cocaine dependence [F14.20] 04/10/2012    Priority: High  . Anxiety disorder [F41.9] 08/14/2011    Priority: High  . Major depressive disorder, recurrent, severe without psychotic features [F33.2]   . Opioid dependence with opioid-induced mood disorder [F11.24]   . Cocaine dependence with cocaine-induced mood disorder [F14.24]   . MDD (major depressive disorder) [F32.2] 09/17/2014  . MDD (major depressive disorder), recurrent severe, without psychosis [F33.2] 07/05/2014  . Polysubstance abuse including IVDA (heroin), cocaine, marijuana [F19.10] 01/10/2012  . Elevated LFTs [R79.89] 01/10/2012  . Chronic hepatitis C without hepatic coma [B18.2] 01/10/2012  . 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 Tobacco abuse [Z72.0] 01/10/2012  . Recurrent major depression-severe [F33.2] 08/13/2011   Total Time spent with patient: 30 minutes   Past Medical History:  Past Medical History  Diagnosis Date  . Depression   . Hepatitis C   . 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 Cellulitis  Of  Left Forearm 01/10/2012    From IVDA    Past Surgical History  Procedure Laterality Date  . I&d extremity  01/13/2012    Procedure: IRRIGATION AND DEBRIDEMENT EXTREMITY;  Surgeon: Kennieth Rad,  MD;  Location: [facility];  Service: Orthopedics;  Laterality: Left;   Family History:  Family History  Problem Relation Age of Onset  . Idiopathic pulmonary fibrosis Father    Social History:  History  Alcohol Use  . 0.0 oz/week    Comment: used once     History  Drug Use  . Yes  . Special: Heroin, "Crack" cocaine    Comment: heroin last used 07/14/14, crack    History   Social History  . Marital Status: Single    Spouse Name: N/A  . Number of Children: N/A  . Years of Education: N/A   Occupational History  . 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 Smoking status: Current Every Day Smoker -- 0.50 packs/day for 20 years    Types: Cigarettes  . Smokeless tobacco: Never Used     Comment: trying to quit-using e-cig some  . Alcohol Use: 0.0 oz/week     Comment: used once  . Drug Use: Yes    Special: Heroin, "Crack" cocaine     Comment: heroin last used 07/14/14, crack  . Sexual Activity:    Partners: Male    Birth Control/ Protection: Condom     Comment: Has been HIV tested in past and has been negative.   Other Topics Concern  . None   Social History Narrative   Transgendered male.  Single.  Lives alone.  Independent.   Additional History:    Sleep: Fair  Appetite:  Fair   Assessment:   Musculoskeletal: Strength & Muscle Tone:  within normal limits Gait & Station: normal Patient leans: normal   Psychiatric Specialty Exam: Physical Exam  Review of Systems  Constitutional: Positive for malaise/fatigue.  HENT: Negative.   Eyes: Negative.   Respiratory: Negative.   Cardiovascular: Negative.   Gastrointestinal: Negative.   Genitourinary: Negative.   Musculoskeletal: Negative.   Skin: Negative.   Endo/Heme/Allergies: Negative.   Psychiatric/Behavioral: Positive for depression and substance abuse. The patient is nervous/anxious.     Blood pressure 115/73, pulse 91, temperature 98 F (36.7 C), temperature source Oral,  resp. rate 16, height 5\' 6"  (1.676 m), weight 76.658 kg (169 lb), SpO2 99 %.Body mass index is 27.29 kg/(m^2).  General Appearance: Fairly Groomed  Patent attorney::  Fair  Speech:  Clear and Coherent  Volume:  Normal  Mood:  Anxious and Depressed  Affect:  Depressed and anxious worried  Thought Process:  Coherent and Goal Directed  Orientation:  Full (Time, Place, and Person)  Thought Content:  symptoms events worries concerns  Suicidal Thoughts:  No  Homicidal Thoughts:  No  Memory:  Immediate;   Fair Recent;   Fair Remote;   Fair  Judgement:  Fair  Insight:  Present  Psychomotor Activity:  Restlessness  Concentration:  Fair  Recall:  [employer] of Knowledge:Fair  Language: Fair  Akathisia:  No  Handed:  Right  AIMS (if indicated):     Assets:  Desire for Improvement  ADL's:  Intact  Cognition: WNL  Sleep:  Number of Hours: 5     Current Medications: Current Facility-Administered Medications  Medication Dose Route Frequency Provider Last Rate Last Dose  . acetaminophen (TYLENOL) tablet 650 mg  650 mg Oral Q6H PRN Adonis Brook, NP   650 mg at 11/22/14 1101  . alum & mag hydroxide-simeth (MAALOX/MYLANTA) 200-200-20 MG/5ML suspension 30 mL  30 mL Oral Q4H PRN Adonis Brook, NP      . ARIPiprazole (ABILIFY) tablet 5 mg  5 mg Oral Daily Shuvon B Rankin, NP   5 mg at 11/22/14 0904  . escitalopram (LEXAPRO) tablet 20 mg  20 mg Oral Daily Shuvon B Rankin, NP   20 mg at 11/22/14 0904  . estradiol (ESTRACE) tablet 2 mg  2 mg Oral TID Shuvon B Rankin, NP   2 mg at 11/22/14 1729  . finasteride (PROSCAR) tablet 5 mg  5 mg Oral Daily Shuvon B Rankin, NP   5 mg at 11/22/14 0904  . LORazepam (ATIVAN) tablet 1 mg  1 mg Oral Q6H PRN [name] Fee, MD   1 mg at 11/22/14 1451  . magnesium hydroxide (MILK OF MAGNESIA) suspension 30 mL  30 mL Oral Daily PRN Adonis Brook, NP      . multivitamin with minerals tablet 1 tablet  1 tablet Oral Daily Adonis Brook, NP   1 tablet at 11/22/14 0904   . nicotine (NICODERM CQ - dosed in mg/24 hours) patch 21 mg  21 mg Transdermal Daily [name] Fee, MD   21 mg at 11/22/14 0905  . spironolactone (ALDACTONE) tablet 50 mg  50 mg Oral Daily Shuvon B Rankin, NP   50 mg at 11/22/14 0904  . thiamine (B-1) injection 100 mg  100 mg Intramuscular Once Adonis Brook, NP   100 mg at 11/17/14 1830  . thiamine (VITAMIN B-1) tablet 100 mg  100 mg Oral Daily Adonis Brook, NP   100 mg at 11/22/14 0903  . traZODone (DESYREL) tablet 100 mg  100 mg Oral QHS PRN Reymundo Poll  Dub MikesLugo, MD        Lab Results: No results found for this or any previous visit (from the past 48 hour(s)).  Physical Findings: AIMS: Facial and Oral Movements Muscles of Facial Expression: None, normal Lips and Perioral Area: None, normal Jaw: None, normal Tongue: None, normal,Extremity Movements Upper (arms, wrists, hands, fingers): None, normal Lower (legs, knees, ankles, toes): None, normal, Trunk Movements Neck, shoulders, hips: None, normal, Overall Severity Severity of abnormal movements (highest score from questions above): None, normal Incapacitation due to abnormal movements: None, normal Patient's awareness of abnormal movements (rate only patient's report): No Awareness, Dental Status Current problems with teeth and/or dentures?: No Does patient usually wear dentures?: No  CIWA:  CIWA-Ar Total: 1 COWS:  COWS Total Score: 4  Treatment Plan Summary: Daily contact with patient to assess and evaluate symptoms and progress in treatment and Medication management Supportive approach/coping skills Cocaine Opioid dependence; work a relapse prevention plan Explore residential treatment options Depression; continue the Lexapro/Abilify  Insomnia; continue the  Trazodone at 100 mg HS Medical Decision Making:  Review of Psycho-Social Stressors (1) and Review of Medication Regimen & Side Effects (2)     Randy [name] A 11/22/2014, 8:31 PM

## 2014-11-22 NOTE — [facility] Group Notes (Signed)
[facility] LCSW Group Therapy 11/22/2014  1:15 PM   Type of Therapy: Group Therapy  Participation Level: Did Not Attend. Patient invited to participate but declined.   Vedant Shehadeh, MSW, LCSWA Clinical Social Worker [facility] [phone]   

## 2014-11-22 NOTE — Progress Notes (Signed)
Pt did not attend karaoke group this evening.  

## 2014-11-23 NOTE — [facility] Group Notes (Signed)
[facility] LCSW Group Therapy 11/23/2014  1:15 PM   Type of Therapy: Group Therapy  Participation Level: Did Not Attend. Patient invited to participate but declined.   Samuella BruinKristin Justice Milliron, MSW, [employer] IncLCSWA Clinical Social Worker [facility] [phone]

## 2014-11-23 NOTE — Progress Notes (Signed)
[facility] MD Progress Note  11/23/2014 5:52 PM Randy [name]  MRN:  [id number] Subjective:  Randy [name] is trying to get her life back together. Right now still feeling stuck. States she is afraid that if she does not go to rehab she is not going to be able to make it. States that she still has some jobs but walked out of one. Not sure where to go from here Principal Problem: Major depressive disorder, recurrent, severe without psychotic features Diagnosis:   Patient Active Problem List   Diagnosis Date Noted  . Major depression, recurrent [F33.9] 04/10/2012    Priority: High  . Cocaine dependence [F14.20] 04/10/2012    Priority: High  . Anxiety disorder [F41.9] 08/14/2011    Priority: High  . Major depressive disorder, recurrent, severe without psychotic features [F33.2]   . Opioid dependence with opioid-induced mood disorder [F11.24]   . Cocaine dependence with cocaine-induced mood disorder [F14.24]   . MDD (major depressive disorder) [F32.2] 09/17/2014  . MDD (major depressive disorder), recurrent severe, without psychosis [F33.2] 07/05/2014  . Polysubstance abuse including IVDA (heroin), cocaine, marijuana [F19.10] 01/10/2012  . Elevated LFTs [R79.89] 01/10/2012  . Chronic hepatitis C without hepatic coma [B18.2] 01/10/2012  . 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 Tobacco abuse [Z72.0] 01/10/2012  . Recurrent major depression-severe [F33.2] 08/13/2011   Total Time spent with patient: 30 minutes   Past Medical History:  Past Medical History  Diagnosis Date  . Depression   . Hepatitis C   . 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 Cellulitis  Of  Left Forearm 01/10/2012    From IVDA    Past Surgical History  Procedure Laterality Date  . I&d extremity  01/13/2012    Procedure: IRRIGATION AND DEBRIDEMENT EXTREMITY;  Surgeon: Kennieth Rad, MD;  Location: [facility];  Service: Orthopedics;  Laterality: Left;   Family History:  Family History   Problem Relation Age of Onset  . Idiopathic pulmonary fibrosis Father    Social History:  History  Alcohol Use  . 0.0 oz/week    Comment: used once     History  Drug Use  . Yes  . Special: Heroin, "Crack" cocaine    Comment: heroin last used 07/14/14, crack    History   Social History  . Marital Status: Single    Spouse Name: N/A  . Number of Children: N/A  . Years of Education: N/A   Occupational History  . 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 Smoking status: Current Every Day Smoker -- 0.50 packs/day for 20 years    Types: Cigarettes  . Smokeless tobacco: Never Used     Comment: trying to quit-using e-cig some  . Alcohol Use: 0.0 oz/week     Comment: used once  . Drug Use: Yes    Special: Heroin, "Crack" cocaine     Comment: heroin last used 07/14/14, crack  . Sexual Activity:    Partners: Male    Birth Control/ Protection: Condom     Comment: Has been HIV tested in past and has been negative.   Other Topics Concern  . None   Social History Narrative   Transgendered male.  Single.  Lives alone.  Independent.   Additional History:    Sleep: Fair  Appetite:  Fair   Assessment:   Musculoskeletal: Strength & Muscle Tone: within normal limits Gait & Station: normal Patient leans: normal   Psychiatric Specialty  Exam: Physical Exam  Review of Systems  Constitutional: Negative.   HENT: Negative.   Eyes: Negative.   Respiratory: Negative.   Cardiovascular: Negative.   Gastrointestinal: Negative.   Genitourinary: Negative.   Musculoskeletal: Negative.   Skin: Negative.   Neurological: Negative.   Endo/Heme/Allergies: Negative.   Psychiatric/Behavioral: Positive for depression and substance abuse. The patient is nervous/anxious.     Blood pressure 117/68, pulse 75, temperature 98.7 F (37.1 C), temperature source Oral, resp. rate 16, height 5\' 6"  (1.676 m), weight 76.658 kg (169 lb), SpO2 99 %.Body mass index is  27.29 kg/(m^2).  General Appearance: Fairly Groomed  Patent attorneyye Contact::  Fair  Speech:  Clear and Coherent  Volume:  Decreased  Mood:  Anxious, Depressed and worried  Affect:  anxious depressed worried  Thought Process:  Coherent and Goal Directed  Orientation:  Full (Time, Place, and Person)  Thought Content:  symptoms events worries concerns  Suicidal Thoughts:  No  Homicidal Thoughts:  No  Memory:  Immediate;   Fair Recent;   Fair Remote;   Fair  Judgement:  Fair  Insight:  Present  Psychomotor Activity:  Restlessness  Concentration:  Fair  Recall:  FiservFair  Fund of Knowledge:Fair  Language: Fair  Akathisia:  No  Handed:  Right  AIMS (if indicated):     Assets:  Desire for Improvement Vocational/Educational  ADL's:  Intact  Cognition: WNL  Sleep:  Number of Hours: 5.75     Current Medications: Current Facility-Administered Medications  Medication Dose Route Frequency Provider Last Rate Last Dose  . acetaminophen (TYLENOL) tablet 650 mg  650 mg Oral Q6H PRN Adonis BrookSheila Agustin, NP   650 mg at 11/22/14 2310  . alum & mag hydroxide-simeth (MAALOX/MYLANTA) 200-200-20 MG/5ML suspension 30 mL  30 mL Oral Q4H PRN Adonis BrookSheila Agustin, NP      . ARIPiprazole (ABILIFY) tablet 5 mg  5 mg Oral Daily Shuvon B Rankin, NP   5 mg at 11/23/14 0806  . escitalopram (LEXAPRO) tablet 20 mg  20 mg Oral Daily Shuvon B Rankin, NP   20 mg at 11/23/14 0806  . estradiol (ESTRACE) tablet 2 mg  2 mg Oral TID Shuvon B Rankin, NP   2 mg at 11/23/14 1704  . finasteride (PROSCAR) tablet 5 mg  5 mg Oral Daily Shuvon B Rankin, NP   5 mg at 11/23/14 0806  . LORazepam (ATIVAN) tablet 1 mg  1 mg Oral Q6H PRN [name] FeeIrving A Marquinn Meschke, MD   1 mg at 11/22/14 2310  . magnesium hydroxide (MILK OF MAGNESIA) suspension 30 mL  30 mL Oral Daily PRN Adonis BrookSheila Agustin, NP      . multivitamin with minerals tablet 1 tablet  1 tablet Oral Daily Adonis BrookSheila Agustin, NP   1 tablet at 11/23/14 0806  . nicotine (NICODERM CQ - dosed in mg/24 hours) patch 21 mg   21 mg Transdermal Daily [name] FeeIrving A Tyresha Fede, MD   21 mg at 11/23/14 0836  . spironolactone (ALDACTONE) tablet 50 mg  50 mg Oral Daily Shuvon B Rankin, NP   50 mg at 11/23/14 0806  . thiamine (B-1) injection 100 mg  100 mg Intramuscular Once Adonis BrookSheila Agustin, NP   100 mg at 11/17/14 1830  . thiamine (VITAMIN B-1) tablet 100 mg  100 mg Oral Daily Adonis BrookSheila Agustin, NP   100 mg at 11/23/14 0806  . traZODone (DESYREL) tablet 100 mg  100 mg Oral QHS PRN [name] FeeIrving A Demaya Hardge, MD   100 mg at 11/22/14 2310  Lab Results: No results found for this or any previous visit (from the past 48 hour(s)).  Physical Findings: AIMS: Facial and Oral Movements Muscles of Facial Expression: None, normal Lips and Perioral Area: None, normal Jaw: None, normal Tongue: None, normal,Extremity Movements Upper (arms, wrists, hands, fingers): None, normal Lower (legs, knees, ankles, toes): None, normal, Trunk Movements Neck, shoulders, hips: None, normal, Overall Severity Severity of abnormal movements (highest score from questions above): None, normal Incapacitation due to abnormal movements: None, normal Patient's awareness of abnormal movements (rate only patient's report): No Awareness, Dental Status Current problems with teeth and/or dentures?: No Does patient usually wear dentures?: No  CIWA:  CIWA-Ar Total: 0 COWS:  COWS Total Score: 4  Treatment Plan Summary: Daily contact with patient to assess and evaluate symptoms and progress in treatment and Medication management Supportive approach/coping skills Opioid Dependence; continue to work a relapse prevention plan Depression; will continue the Lexapro and the Abilify Will work with CBT/mindfulness Will continue to explore residential treatment options Medical Decision Making:  Review of Psycho-Social Stressors (1) and Review of Medication Regimen & Side Effects (2)     Romaine Neville A 11/23/2014, 5:52 PM

## 2014-11-23 NOTE — [facility] Group Notes (Signed)
   [facility] Dis[facility] Note  11/23/2014  8:45 AM   Participation Quality: Alert, Appropriate and Oriented  Mood/Affect: Depressed and Flat  Depression Rating: 5  Anxiety Rating: 5  Thoughts of Suicide: Pt denies SI/HI  Will you contract for safety? Yes  Current AVH: Pt denies  Plan for Discharge/Comments: Pt attended discharge planning group and actively participated in group. CSW provided pt with today's workbook. Patient reports feeling "okay" today. She continues to be interested in residential treatment options and [facility]  Transportation Means: Pt reports access to transportation  Supports: No supports mentioned at this time  Samuella BruinKristin Michell Giuliano, MSW, [employer]CSWA Clinical Social Worker [facility] [phone]

## 2014-11-23 NOTE — Progress Notes (Signed)
Recreation Therapy Notes  Date: 07.15.16 Time: 9:30 am Location: [address] Dayroom  Group Topic: Stress Management  Goal Area(s) Addresses:  Patient will verbalize importance of using healthy stress management.  Patient will identify positive emotions associated with healthy stress management.   Intervention: Stress Management  Activity : Progressive Muscle Relaxation. LRT introduced and explained the stress management technique of progressive muscle relaxation. LRT used a script to deliver the technique. Patients were asked to follow the script read a loud by the LRT to participate in the stress management technique.  Education: Stress Management, Discharge Planning.   Education Outcome: Acknowledges edcuation/In group clarification offered  Clinical Observations/Feedback: Patient did not attend group.   Caroll RancherMarjette Brendon Christoffel, LRT/CTRS  Caroll RancherLindsay, Gianny Killman A 11/23/2014 11:57 AM

## 2014-11-23 NOTE — Clinical Social Work Note (Signed)
[facility] auth # [id number]  Santa GeneraAnne Cunningham, LCSW Clinical Social Worker

## 2014-11-23 NOTE — Plan of Care (Signed)
Problem: Alteration in mood & ability to function due to Goal: LTG-Pt verbalizes understanding of importance of med regimen (Patient verbalizes understanding of importance of medication regimen and need to continue outpatient care and support groups)  Outcome: Completed/Met Date Met:  11/23/14 Patient verbalizes understanding of his medications, administration and indications.

## 2014-11-23 NOTE — Progress Notes (Signed)
Pt did not attend AA group this evening.  

## 2014-11-23 NOTE — Clinical Social Work Note (Signed)
Referrals re-faxed to [facility] at their request.   Samuella BruinKristin Igor Bishop, MSW, [facility] Clinical Social Worker [facility] [phone]

## 2014-11-23 NOTE — Progress Notes (Signed)
D:  Patient seems to be improving with his treatment.  He is sleeping and eating well.  He attends some groups with minimal participation.  He continues to voice his desire for long term treatment for his drug use.  He denies any SI/HI/AVH.  He denies any physical pain.  He reports minimal withdrawal symptoms.  Patient has bright affect; eye contact is good.  His thought process and intact and relevant to speech.   A: Continue to monitor medication management and MD orders.  Safety checks completed every 15 minutes per protocol.  Offer support and encouragement as needed. R: Patient has been visible in the milieu today.  Encourage group attendance and participation.

## 2014-11-23 NOTE — Tx Team (Signed)
Interdisciplinary Treatment Plan Update (Adult) Date: 11/23/2014    Time Reviewed: 9:30 AM  Progress in Treatment: Attending groups: Minimally Participating in groups: Minimally Taking medication as prescribed: Yes Tolerating medication: Yes Family/Significant other contact made: No, patient has declined collateral contact Patient understands diagnosis: Yes Discussing patient identified problems/goals with staff: Yes Medical problems stabilized or resolved: Yes Denies suicidal/homicidal ideation: Yes Issues/concerns per patient self-inventory: Yes Other:  New problem(s) identified: N/A  Discharge Plan or Barriers: 11/20/14:  CSW continuing to assess, patient new to milieu.  7/15: Patient continues to be interested in residential treatment, [facility] referral pending. [facility]  Reason for Continuation of Hospitalization:  Depression Anxiety Medication Stabilization   Comments: N/A  Estimated length of stay: 2-3 days  For review of initial/current patient goals, please see plan of care. Randy [name] is a 44 year old male, Caucasian, male transitioning to male reporting increased depression and SI with a plan to overdose.Stressors include homelessness, drug use, and recent death of friend. She is requesting referral to [facility] at this time. She will benefit from crisis stabilization, evaluation for medication, psycho-education groups for coping skills development, group therapy and case management for discharge planning.   Attendees: Patient:    Family:    Physician: Dr. Jama Flavorsobos; Dr. Dub MikesLugo 11/23/2014 9:30 AM  Nursing: Joslyn Devonaroline Beaudry, [address]Mariam Earleen NewportFriedman, Beverly Knight , RN 11/23/2014 9:30 AM  Clinical Social Worker: Samuella BruinKristin Deshante Cassell,  LCSWA 11/23/2014 9:30 AM  Other: Chad CordialLauren Carter, LCSWA  11/23/2014 9:30 AM  Other: Cleopatra Cedareggie King, [facility] Liaison 11/23/2014 9:30 AM  Other: Onnie BoerJennifer Clark, Case Manager 11/23/2014 9:30 AM  Other: Mosetta AnisAggie Nwoko,  Laura Davis NP 11/23/2014 9:30 AM  Other: Santa GeneraAnne Cunningham, LCSW 11/23/2014 9:30 AM  Other:    Other:    Other:       Scribe for Treatment Team:  Samuella BruinKristin Corena Tilson, MSW, [employer]CSWA [phone]

## 2014-11-23 NOTE — Progress Notes (Signed)
Patient ID: Randy [name], male   DOB: [birth year], 44 y.o.   MRN: [id number] Pt with minimal interaction with staff and peers.  Pt remaining in room in bed this evening.  Pt reported he was tired.  Needs assessed. Pt denied. Pt denied SI, HI and AVH.  Difficult to engage in conversation.  Offered support and encouragement. Fifteen minute checks in progress for patient safety. Pt safe on unit.

## 2014-11-24 MED ORDER — PANTOPRAZOLE SODIUM 40 MG PO TBEC
40.0000 mg | DELAYED_RELEASE_TABLET | Freq: Every day | ORAL | Status: DC
  Administered 2014-11-24 – 2014-11-26 (×3): 40 mg via ORAL
  Filled 2014-11-24 (×6): qty 1

## 2014-11-24 NOTE — Progress Notes (Signed)
Patient did not attend the evening speaker AA meeting. Pt was notified that group was beginning but remained in bed.   

## 2014-11-24 NOTE — Plan of Care (Signed)
Problem: Alteration in mood & ability to function due to Goal: LTG-Pt reports reduction in suicidal thoughts (Patient reports reduction in suicidal thoughts and is able to verbalize a safety plan for whenever patient is feeling suicidal)  Outcome: Completed/Met Date Met:  11/24/14 Patient denies any suicidal ideation.  Patient's depression has decreased.

## 2014-11-24 NOTE — [facility] Group Notes (Signed)
[facility] Group Notes:  (Clinical Social Work)  11/24/2014     10-11AM  Summary of Progress/Problems:   The main focus of today's process group was to learn how to use a decisional balance exercise to move forward in the Stages of Change, which were described and discussed.  Motivational Interviewing and a worksheet and the whiteboard were utilized to help patients explore in depth the perceived benefits and costs of unhealthy coping techniques, as well as the  benefits and costs of replacing that with a healthy coping skills.   The patient expressed that her unhealthy coping involves using drugs and her healthy coping techniques include talking with friends; however, the two are often combined.  She spoke of losing her best friend to an overdose a couple of months and how that has made her realize just how dangerous what she is doing is.  She herself has had two overdoses this year already and "had to be brought back" and states that did not have as large an impact on her as losing her friend.  However, that has only impacted her thought processes regarding heroin, not cocaine.  In order to stop using, she stated [facility] because that would give her at least 6 months of not knowing where to get drugs.  Type of Therapy:  Group Therapy - Process   Participation Level:  Active  Participation Quality:  Appropriate, Attentive, Sharing and Supportive  Affect:  Appropriate  Cognitive:  Alert, Appropriate and Oriented  Insight:  Engaged  Engagement in Therapy:  Engaged  Modes of Intervention:  Education, Motivational Interviewing  Ambrose MantleMareida Grossman-Orr, LCSW 11/24/2014, 12:07 PM

## 2014-11-24 NOTE — Progress Notes (Signed)
D: Patient has brighter affect today.  Hopes to hear something today about further treatment after discharge.  She did say she has somewhere "safe" to go if she can't get into a treatment facility.  She rates her depression as a 5; hopelessness as a 4; anxiety as a 6.  Her withdrawal symptoms have decreased.  Her only complaint is anxiety which she was given ativan.  She has been attending groups with minimal participation.   A: Continue to monitor medication management and MD orders.  Safety checks completed every 15 minutes per protocol.  Offer support and encouragement as needed. R: Patient is interacting well with staff and peers.  She is visible in the milieu.

## 2014-11-24 NOTE — [facility] Group Notes (Signed)
[facility] Group Notes:  (Nursing/MHT/Case Management/Adjunct)  Date:  11/24/2014  Time:  0900 am  Type of Therapy:  Psychoeducational Skills  Participation Level:  Minimal  Participation Quality:  Resistant  Affect:  Appropriate  Cognitive:  Alert and Appropriate  Insight:  Improving  Engagement in Group:  Resistant  Modes of Intervention:  Support  Summary of Progress/Problems: Randy [name] actively listened, but decline to contribute to conversation.   Cranford MonBeaudry, Hue Frick Evans 11/24/2014, 9:39 AM

## 2014-11-24 NOTE — Progress Notes (Signed)
[facility] MD Progress Note  11/24/2014 10:22 PM Randy [name]  MRN:  [id number] Subjective:  Randy [name] continues to try to get herself together. Dealing with the uncertainty of not having a stable situation in her life. Would like to go to rehab. Once out of rehab will take the next step. 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 C/O of pain right lower quadrant not accompanied by nausea or vomiting etc Principal Problem: Major depressive disorder, recurrent, severe without psychotic features Diagnosis:   Patient Active Problem List   Diagnosis Date Noted  . Major depression, recurrent [F33.9] 04/10/2012    Priority: High  . Cocaine dependence [F14.20] 04/10/2012    Priority: High  . Anxiety disorder [F41.9] 08/14/2011    Priority: High  . Major depressive disorder, recurrent, severe without psychotic features [F33.2]   . Opioid dependence with opioid-induced mood disorder [F11.24]   . Cocaine dependence with cocaine-induced mood disorder [F14.24]   . MDD (major depressive disorder) [F32.2] 09/17/2014  . MDD (major depressive disorder), recurrent severe, without psychosis [F33.2] 07/05/2014  . Polysubstance abuse including IVDA (heroin), cocaine, marijuana [F19.10] 01/10/2012  . Elevated LFTs [R79.89] 01/10/2012  . Chronic hepatitis C without hepatic coma [B18.2] 01/10/2012  . 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 Tobacco abuse [Z72.0] 01/10/2012  . Recurrent major depression-severe [F33.2] 08/13/2011   Total Time spent with patient: 30 minutes   Past Medical History:  Past Medical History  Diagnosis Date  . Depression   . Hepatitis C   . 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 Cellulitis  Of  Left Forearm 01/10/2012    From IVDA    Past Surgical History  Procedure Laterality Date  . I&d extremity  01/13/2012    Procedure: IRRIGATION AND DEBRIDEMENT EXTREMITY;  Surgeon: Kennieth Rad, MD;  Location: [facility];  Service: Orthopedics;  Laterality: Left;   Family History:  Family History  Problem Relation Age of Onset  . Idiopathic pulmonary fibrosis Father    Social History:  History  Alcohol Use  . 0.0 oz/week    Comment: used once     History  Drug Use  . Yes  . Special: Heroin, "Crack" cocaine    Comment: heroin last used 07/14/14, crack    History   Social History  . Marital Status: Single    Spouse Name: N/A  . Number of Children: N/A  . Years of Education: N/A   Occupational History  . 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 Smoking status: Current Every Day Smoker -- 0.50 packs/day for 20 years    Types: Cigarettes  . Smokeless tobacco: Never Used     Comment: trying to quit-using e-cig some  . Alcohol Use: 0.0 oz/week     Comment: used once  . Drug Use: Yes    Special: Heroin, "Crack" cocaine     Comment: heroin last used 07/14/14, crack  . Sexual Activity:    Partners: Male    Birth Control/ Protection: Condom     Comment: Has been HIV tested in past and has been negative.   Other Topics Concern  . None   Social History Narrative   Transgendered male.  Single.  Lives alone.  Independent.   Additional History:    Sleep: Poor  Appetite:  Fair   Assessment:   Musculoskeletal: Strength &  Muscle Tone: within normal limits Gait & Station: normal Patient leans: normal   Psychiatric Specialty Exam: Physical Exam  Review of Systems  Constitutional: Negative.   HENT: Negative.   Eyes: Negative.   Respiratory: Negative.   Cardiovascular: Negative.   Gastrointestinal: Positive for abdominal pain.  Genitourinary: Negative.   Musculoskeletal: Negative.   Skin: Negative.   Endo/Heme/Allergies: Negative.   Psychiatric/Behavioral: Positive for depression and substance abuse. The patient is nervous/anxious and has insomnia.     Blood pressure 94/58, pulse 89, temperature 97.6 F (36.4 C),  temperature source Oral, resp. rate 16, height 5\' 6"  (1.676 m), weight 76.658 kg (169 lb), SpO2 99 %.Body mass index is 27.29 kg/(m^2).  General Appearance: Fairly Groomed  Patent attorney::  Fair  Speech:  Clear and Coherent  Volume:  Decreased  Mood:  Anxious and Depressed  Affect:  anxious worried  Thought Process:  Coherent and Goal Directed  Orientation:  Full (Time, Place, and Person)  Thought Content:  symptoms events worries concerns  Suicidal Thoughts:  No  Homicidal Thoughts:  No  Memory:  Immediate;   Fair Recent;   Fair Remote;   Fair  Judgement:  Fair  Insight:  Present  Psychomotor Activity:  Restlessness  Concentration:  Fair  Recall:  [employer] of Knowledge:Fair  Language: Fair  Akathisia:  No  Handed:  Right  AIMS (if indicated):     Assets:  Desire for Improvement  ADL's:  Intact  Cognition: WNL  Sleep:  Number of Hours: 2.75     Current Medications: Current Facility-Administered Medications  Medication Dose Route Frequency Provider Last Rate Last Dose  . acetaminophen (TYLENOL) tablet 650 mg  650 mg Oral Q6H PRN Adonis Brook, NP   650 mg at 11/24/14 2205  . alum & mag hydroxide-simeth (MAALOX/MYLANTA) 200-200-20 MG/5ML suspension 30 mL  30 mL Oral Q4H PRN Adonis Brook, NP      . ARIPiprazole (ABILIFY) tablet 5 mg  5 mg Oral Daily Shuvon B Rankin, NP   5 mg at 11/24/14 0754  . escitalopram (LEXAPRO) tablet 20 mg  20 mg Oral Daily Shuvon B Rankin, NP   20 mg at 11/24/14 0754  . estradiol (ESTRACE) tablet 2 mg  2 mg Oral TID Shuvon B Rankin, NP   2 mg at 11/24/14 1819  . finasteride (PROSCAR) tablet 5 mg  5 mg Oral Daily Shuvon B Rankin, NP   5 mg at 11/24/14 0754  . LORazepam (ATIVAN) tablet 1 mg  1 mg Oral Q6H PRN [name] Fee, MD   1 mg at 11/24/14 2205  . magnesium hydroxide (MILK OF MAGNESIA) suspension 30 mL  30 mL Oral Daily PRN Adonis Brook, NP      . multivitamin with minerals tablet 1 tablet  1 tablet Oral Daily Adonis Brook, NP   1 tablet at  11/24/14 0754  . nicotine (NICODERM CQ - dosed in mg/24 hours) patch 21 mg  21 mg Transdermal Daily [name] Fee, MD   21 mg at 11/24/14 0758  . pantoprazole (PROTONIX) EC tablet 40 mg  40 mg Oral Daily [name] Fee, MD   40 mg at 11/24/14 1819  . spironolactone (ALDACTONE) tablet 50 mg  50 mg Oral Daily Shuvon B Rankin, NP   50 mg at 11/24/14 0754  . thiamine (B-1) injection 100 mg  100 mg Intramuscular Once Adonis Brook, NP   100 mg at 11/17/14 1830  . thiamine (VITAMIN B-1) tablet 100 mg  100  mg Oral Daily Adonis BrookSheila Agustin, NP   100 mg at 11/24/14 0754  . traZODone (DESYREL) tablet 100 mg  100 mg Oral QHS PRN [name] FeeIrving A Moses Odoherty, MD   100 mg at 11/24/14 2205    Lab Results: No results found for this or any previous visit (from the past 48 hour(s)).  Physical Findings: AIMS: Facial and Oral Movements Muscles of Facial Expression: None, normal Lips and Perioral Area: None, normal Jaw: None, normal Tongue: None, normal,Extremity Movements Upper (arms, wrists, hands, fingers): None, normal Lower (legs, knees, ankles, toes): None, normal, Trunk Movements Neck, shoulders, hips: None, normal, Overall Severity Severity of abnormal movements (highest score from questions above): None, normal Incapacitation due to abnormal movements: None, normal Patient's awareness of abnormal movements (rate only patient's report): No Awareness, Dental Status Current problems with teeth and/or dentures?: No Does patient usually wear dentures?: No  CIWA:  CIWA-Ar Total: 0 COWS:  COWS Total Score: 4  Treatment Plan Summary: Daily contact with patient to assess and evaluate symptoms and progress in treatment and Medication management Supportive approach/coping skills Opioid dependence; continue to work a relapse prevention plan Depression; continue the Lexapro and the Abilify CBT/mindfulness NP to evaluate pain Medical Decision Making:  Review of Psycho-Social Stressors (1) and Review of Medication Regimen &  Side Effects (2)     Reise Gladney A 11/24/2014, 10:22 PM

## 2014-11-24 NOTE — Progress Notes (Signed)
D: Randy [name] spent most of the shift in her room. Reports generalized body pain of 4/10. Accepted Acetaminophen 650 mg PO PRN for pain. Denies SI, ah/vh at this time. Endorses depression 5/10 and anxiety 5/10. A: Support and encouragement offered to patient. Patient encouraged verbalize needs to staff. Every 15 minutes check for safety maintained. Will continue to monitor patient for safety and stability.  R: Patient remains safe.

## 2014-11-25 NOTE — Progress Notes (Signed)
D: [name] more cheerful and pleasant. Seen on day room interacting and socializing. Patient denies pain, SI/HI, AH/VH at this time. Patient denies any withdrawal symptoms and none noted. Compliant with the treatment/medication plan. Patient made no new complaint.  A: Patient encouraged to continue with the treatment plan and verbalize needs to staff. Every 15 minutes check for safety maintained. Will continue to monitor patient for safety and stability.  R: Patient remains safe.

## 2014-11-25 NOTE — Progress Notes (Signed)
Patient did attend the evening speaker AA meeting.  

## 2014-11-25 NOTE — Progress Notes (Signed)
D: Patient is eating and sleeping well.  She is more visible on the milieu.  Patient has brighter affect and is interacting more with staff and her peers.  She is unsure about plan regarding her discharge.  She still has hopes to get into a long term treatment facility.  She rates her depression as a 5; hopelessness as a 4; anxiety as a 6.  She has completed her protocol and denies any withdrawal symptoms.  She denies SI/HI/AVH. A: Continue to monitor medication management and MD orders.  Safety checks completed every 15 minutes per protocol.  Offer support and encouragement as needed. R: Patient is receptive to staff.

## 2014-11-25 NOTE — [facility] Group Notes (Signed)
[facility] Group Notes:  (Nursing/MHT/Case Management/Adjunct)  Date:  11/25/2014  Time: 0900 am  Type of Therapy:  Psychoeducational Skills  Participation Level:  Minimal  Participation Quality:  Attentive  Affect:  Appropriate  Cognitive:  Alert  Insight:  Improving  Engagement in Group:  Resistant  Modes of Intervention:  Support  Summary of Progress/Problems: Randy [name] has bright affect today.  She sat quietly in the corner putting a puzzle together.  Would participate when prompted.  Randy [name], Randy [name] 11/25/2014, 10:19 AM

## 2014-11-25 NOTE — Plan of Care (Signed)
Problem: Consults Goal: Substance Abuse Patient Education See Patient Education Module for education specifics.  Outcome: Progressing Educated patient on the importance of a support system upon discharge.  Educated patient on the importance of attending NA meetings.

## 2014-11-25 NOTE — [facility] Group Notes (Signed)
[facility] Group Notes:  (Clinical Social Work)  11/25/2014  10:00-11:00AM  Summary of Progress/Problems:   The main focus of today's process group was to   1)  discuss the importance of adding supports  2)  define health supports versus unhealthy supports  3)  identify the patient's current unhealthy supports and plan how to handle them  4)  Identify the patient's current healthy supports and plan what to add.  An emphasis was placed on using counselor, doctor, therapy groups, 12-step groups, and problem-specific support groups to expand supports.    The patient expressed full comprehension of the concepts presented, and agreed that there is a need to add more supports.  The patient stated her close friends are healthy for her and emphasized that even if her friends use drugs, they will agree to not use around her if requested.  She said her drug dealer in her unhealthy support.    Type of Therapy:  Process Group with Motivational Interviewing  Participation Level:  Active  Participation Quality:  Attentive, Sharing and Supportive  Affect:  Appropriate  Cognitive:  Alert, Appropriate and Oriented  Insight:  Engaged  Engagement in Therapy:  Engaged  Modes of Intervention:   Education, Support and Processing, Activity  Ambrose MantleMareida Grossman-Orr, LCSW 11/25/2014

## 2014-11-25 NOTE — Progress Notes (Signed)
[facility] MD Progress Note  11/25/2014 7:05 PM Randy [name]  MRN:  [id number] Subjective:  Randy [name] states she is getting anxious. She is worried about being able to finish the jobs that she has pending. States this is good for her as she was not caring for anything. She states that she would like to go to [facility] as she knows she needs more help. At the same time it would ease her mind if she could take care of those jobs before going to [facility]. She does not know anything past that Principal Problem: Major depressive disorder, recurrent, severe without psychotic features Diagnosis:   Patient Active Problem List   Diagnosis Date Noted  . Major depression, recurrent [F33.9] 04/10/2012    Priority: High  . Cocaine dependence [F14.20] 04/10/2012    Priority: High  . Anxiety disorder [F41.9] 08/14/2011    Priority: High  . Major depressive disorder, recurrent, severe without psychotic features [F33.2]   . Opioid dependence with opioid-induced mood disorder [F11.24]   . Cocaine dependence with cocaine-induced mood disorder [F14.24]   . MDD (major depressive disorder) [F32.2] 09/17/2014  . MDD (major depressive disorder), recurrent severe, without psychosis [F33.2] 07/05/2014  . Polysubstance abuse including IVDA (heroin), cocaine, marijuana [F19.10] 01/10/2012  . Elevated LFTs [R79.89] 01/10/2012  . Chronic hepatitis C without hepatic coma [B18.2] 01/10/2012  . 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 Tobacco abuse [Z72.0] 01/10/2012  . Recurrent major depression-severe [F33.2] 08/13/2011   Total Time spent with patient: 30 minutes   Past Medical History:  Past Medical History  Diagnosis Date  . Depression   . Hepatitis C   . 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 Cellulitis  Of  Left Forearm 01/10/2012    From IVDA    Past Surgical History  Procedure Laterality Date  . I&d extremity  01/13/2012    Procedure: IRRIGATION AND DEBRIDEMENT EXTREMITY;   Surgeon: Kennieth Rad, MD;  Location: [facility];  Service: Orthopedics;  Laterality: Left;   Family History:  Family History  Problem Relation Age of Onset  . Idiopathic pulmonary fibrosis Father    Social History:  History  Alcohol Use  . 0.0 oz/week    Comment: used once     History  Drug Use  . Yes  . Special: Heroin, "Crack" cocaine    Comment: heroin last used 07/14/14, crack    History   Social History  . Marital Status: Single    Spouse Name: N/A  . Number of Children: N/A  . Years of Education: N/A   Occupational History  . 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 Smoking status: Current Every Day Smoker -- 0.50 packs/day for 20 years    Types: Cigarettes  . Smokeless tobacco: Never Used     Comment: trying to quit-using e-cig some  . Alcohol Use: 0.0 oz/week     Comment: used once  . Drug Use: Yes    Special: Heroin, "Crack" cocaine     Comment: heroin last used 07/14/14, crack  . Sexual Activity:    Partners: Male    Birth Control/ Protection: Condom     Comment: Has been HIV tested in past and has been negative.   Other Topics Concern  . None   Social History Narrative   Transgendered male.  Single.  Lives alone.  Independent.   Additional History:    Sleep: Fair  Appetite:  Fair  Assessment:   Musculoskeletal: Strength & Muscle Tone: within normal limits Gait & Station: normal Patient leans: normal   Psychiatric Specialty Exam: Physical Exam  Review of Systems  Constitutional: Negative.   HENT: Negative.   Eyes: Negative.   Respiratory: Negative.   Cardiovascular: Negative.   Gastrointestinal: Negative.   Genitourinary: Negative.   Musculoskeletal: Negative.   Skin: Negative.   Neurological: Negative.   Endo/Heme/Allergies: Negative.   Psychiatric/Behavioral: Positive for depression and substance abuse. The patient is nervous/anxious.     Blood pressure 115/90, pulse 104, temperature 98 F (36.7  C), temperature source Oral, resp. rate 20, height 5\' 6"  (1.676 m), weight 76.658 kg (169 lb), SpO2 99 %.Body mass index is 27.29 kg/(m^2).  General Appearance: Fairly Groomed  Patent attorneyye Contact::  Fair  Speech:  Clear and Coherent  Volume:  Decreased  Mood:  Anxious and worried  Affect:  anxious worried  Thought Process:  Coherent and Goal Directed  Orientation:  Full (Time, Place, and Person)  Thought Content:  worries concerns  Suicidal Thoughts:  No  Homicidal Thoughts:  No  Memory:  Immediate;   Fair Recent;   Fair Remote;   Fair  Judgement:  Fair  Insight:  Present  Psychomotor Activity:  Normal  Concentration:  Fair  Recall:  [employer]  Fund of Knowledge:Fair  Language: Fair  Akathisia:  No  Handed:  Right  AIMS (if indicated):     Assets:  Desire for Improvement Vocational/Educational  ADL's:  Intact  Cognition: WNL  Sleep:  Number of Hours: 2.75     Current Medications: Current Facility-Administered Medications  Medication Dose Route Frequency Provider Last Rate Last Dose  . acetaminophen (TYLENOL) tablet 650 mg  650 mg Oral Q6H PRN Adonis BrookSheila Agustin, NP   650 mg at 11/24/14 2205  . alum & mag hydroxide-simeth (MAALOX/MYLANTA) 200-200-20 MG/5ML suspension 30 mL  30 mL Oral Q4H PRN Adonis BrookSheila Agustin, NP      . ARIPiprazole (ABILIFY) tablet 5 mg  5 mg Oral Daily Shuvon B Rankin, NP   5 mg at 11/25/14 0832  . escitalopram (LEXAPRO) tablet 20 mg  20 mg Oral Daily Shuvon B Rankin, NP   20 mg at 11/25/14 0832  . estradiol (ESTRACE) tablet 2 mg  2 mg Oral TID Shuvon B Rankin, NP   2 mg at 11/25/14 1722  . finasteride (PROSCAR) tablet 5 mg  5 mg Oral Daily Shuvon B Rankin, NP   5 mg at 11/25/14 0833  . LORazepam (ATIVAN) tablet 1 mg  1 mg Oral Q6H PRN [name] FeeIrving A Fatumata Kashani, MD   1 mg at 11/25/14 1723  . magnesium hydroxide (MILK OF MAGNESIA) suspension 30 mL  30 mL Oral Daily PRN Adonis BrookSheila Agustin, NP      . multivitamin with minerals tablet 1 tablet  1 tablet Oral Daily Adonis BrookSheila Agustin, NP   1  tablet at 11/25/14 [phone]  . nicotine (NICODERM CQ - dosed in mg/24 hours) patch 21 mg  21 mg Transdermal Daily [name] FeeIrving A Elfrieda Espino, MD   21 mg at 11/25/14 0836  . pantoprazole (PROTONIX) EC tablet 40 mg  40 mg Oral Daily [name] FeeIrving A Dusty Wagoner, MD   40 mg at 11/25/14 [id number]  . spironolactone (ALDACTONE) tablet 50 mg  50 mg Oral Daily Shuvon B Rankin, NP   50 mg at 11/25/14 0832  . thiamine (B-1) injection 100 mg  100 mg Intramuscular Once Adonis BrookSheila Agustin, NP   100 mg at 11/17/14 1830  . thiamine (VITAMIN B-1) tablet  100 mg  100 mg Oral Daily Adonis Brook, NP   100 mg at 11/25/14 1610  . traZODone (DESYREL) tablet 100 mg  100 mg Oral QHS PRN [name] Fee, MD   100 mg at 11/24/14 2205    Lab Results: No results found for this or any previous visit (from the past 48 hour(s)).  Physical Findings: AIMS: Facial and Oral Movements Muscles of Facial Expression: None, normal Lips and Perioral Area: None, normal Jaw: None, normal Tongue: None, normal,Extremity Movements Upper (arms, wrists, hands, fingers): None, normal Lower (legs, knees, ankles, toes): None, normal, Trunk Movements Neck, shoulders, hips: None, normal, Overall Severity Severity of abnormal movements (highest score from questions above): None, normal Incapacitation due to abnormal movements: None, normal Patient's awareness of abnormal movements (rate only patient's report): No Awareness, Dental Status Current problems with teeth and/or dentures?: No Does patient usually wear dentures?: No  CIWA:  CIWA-Ar Total: 0 COWS:  COWS Total Score: 4  Treatment Plan Summary: Daily contact with patient to assess and evaluate symptoms and progress in treatment and Medication management Supportive approach/coping skills Opioid dependence; continue to work a relapse prevention plan Major depression; continue the Lexapro/Abilify combination Work with CBT/mindfulness Check with [facility] in the morning  Medical Decision Making:  Review of Psycho-Social  Stressors (1) and Review of Medication Regimen & Side Effects (2)     Lindsy Cerullo A 11/25/2014, 7:05 PM

## 2014-11-26 MED ORDER — ADULT MULTIVITAMIN W/MINERALS CH: 1.0000 | ORAL_TABLET | Freq: Every day | ORAL | Status: DC

## 2014-11-26 MED ORDER — ESTRADIOL 2 MG PO TABS: 2.0000 mg | ORAL_TABLET | Freq: Three times a day (TID) | ORAL | Status: DC

## 2014-11-26 MED ORDER — PANTOPRAZOLE SODIUM 40 MG PO TBEC: 40.0000 mg | DELAYED_RELEASE_TABLET | Freq: Every day | ORAL | Status: DC

## 2014-11-26 MED ORDER — LORAZEPAM 0.5 MG PO TABS: ORAL_TABLET | ORAL | Status: DC

## 2014-11-26 MED ORDER — NICOTINE 21 MG/24HR TD PT24: 21.0000 mg | MEDICATED_PATCH | Freq: Every day | TRANSDERMAL | Status: DC

## 2014-11-26 MED ORDER — SPIRONOLACTONE 50 MG PO TABS: 50.0000 mg | ORAL_TABLET | Freq: Every day | ORAL | Status: DC

## 2014-11-26 MED ORDER — TRAZODONE HCL 100 MG PO TABS: 100.0000 mg | ORAL_TABLET | Freq: Every evening | ORAL | Status: DC | PRN

## 2014-11-26 MED ORDER — FINASTERIDE 5 MG PO TABS: 5.0000 mg | ORAL_TABLET | Freq: Every day | ORAL | Status: DC

## 2014-11-26 MED ORDER — ARIPIPRAZOLE 5 MG PO TABS: 5.0000 mg | ORAL_TABLET | Freq: Every day | ORAL | Status: DC

## 2014-11-26 MED ORDER — ESCITALOPRAM OXALATE 20 MG PO TABS: 20.0000 mg | ORAL_TABLET | Freq: Every day | ORAL | Status: DC

## 2014-11-26 NOTE — [facility] Suicide Risk Assessment (Signed)
[facility]   Demographic Factors:  Caucasian  Total Time spent with patient: 30 minutes  Musculoskeletal: Strength & Muscle Tone: within normal limits Gait & Station: normal Patient leans: normal  Psychiatric Specialty Exam: Physical Exam  Review of Systems  Constitutional: Negative.   HENT: Negative.   Eyes: Negative.   Respiratory: Negative.   Cardiovascular: Negative.   Gastrointestinal: Negative.   Genitourinary: Negative.   Musculoskeletal: Negative.   Skin: Negative.   Neurological: Negative.   Endo/Heme/Allergies: Negative.   Psychiatric/Behavioral: Positive for depression and substance abuse.    Blood pressure 108/72, pulse 92, temperature 98.3 F (36.8 C), temperature source Oral, resp. rate 16, height 5\' 6"  (1.676 m), weight 76.658 kg (169 lb), SpO2 99 %.Body mass index is 27.29 kg/(m^2).  General Appearance: Fairly Groomed  Patent attorneyye Contact::  Fair  Speech:  Clear and Coherent409  Volume:  Normal  Mood:  Euthymic  Affect:  Appropriate  Thought Process:  Coherent and Goal Directed  Orientation:  Full (Time, Place, and Person)  Thought Content:  plans as she moves on, relapse preveniton plan  Suicidal Thoughts:  No  Homicidal Thoughts:  No  Memory:  Immediate;   Fair Recent;   Fair Remote;   Fair  Judgement:  Fair  Insight:  Present  Psychomotor Activity:  Normal  Concentration:  Fair  Recall:  FiservFair  Fund of Knowledge:Fair  Language: Fair  Akathisia:  No  Handed:  Right  AIMS (if indicated):     Assets:  Desire for Improvement Social Support  Sleep:  Number of Hours: 2.75  Cognition: WNL  ADL's:  Intact   Have you used any form of tobacco in the last 30 days? (Cigarettes, Smokeless Tobacco, Cigars, and/or Pipes): Yes  Has this patient used any form of tobacco in the last 30 days? (Cigarettes, Smokeless Tobacco, Cigars, and/or Pipes) Yes, A prescription for an FDA-approved tobacco cessation medication was offered at discharge  and the patient refused  Mental Status Per Nursing Assessment::   On Admission:  Suicidal ideation indicated by patient  Current Mental Status by Physician: In full contact with reality. There are no active SI plans or intent. There are no active S/S of withdrawal. Willing and motivated to pursue treatment. [facility] Will try to rescue some of his work that she had not finished. States she is planning to go back to school   Loss Factors: Loss of significant relationship  Historical Factors: NA  Risk Reduction Factors:   Sense of responsibility to family and Positive social support  Continued Clinical Symptoms:  Depression:   Comorbid alcohol abuse/dependence Alcohol/Substance Abuse/Dependencies  Cognitive Features That Contribute To Risk:  None    Suicide Risk:  Minimal: No identifiable suicidal ideation.  Patients presenting with no risk factors but with morbid ruminations; may be classified as minimal risk based on the severity of the depressive symptoms  Principal Problem: Major depressive disorder, recurrent, severe without psychotic features Discharge Diagnoses:  Patient Active Problem List   Diagnosis Date Noted  . Major depression, recurrent [F33.9] 04/10/2012    Priority: High  . Cocaine dependence [F14.20] 04/10/2012    Priority: High  . Anxiety disorder [F41.9] 08/14/2011    Priority: High  . Major depressive disorder, recurrent, severe without psychotic features [F33.2]   . Opioid dependence with opioid-induced mood disorder [F11.24]   . Cocaine dependence with cocaine-induced mood disorder [F14.24]   . MDD (major depressive disorder) [F32.2] 09/17/2014  .  MDD (major depressive disorder), recurrent severe, without psychosis [F33.2] 07/05/2014  . Polysubstance abuse including IVDA (heroin), cocaine, marijuana [F19.10] 01/10/2012  . Elevated LFTs [R79.89] 01/10/2012  . Chronic hepatitis C without hepatic coma [B18.2] 01/10/2012  . 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 Tobacco abuse [Z72.0] 01/10/2012  . Recurrent major depression-severe [F33.2] 08/13/2011    Follow-up Information    Follow up with [facility].   Why:  Please call Shayla weekly to inquire about status of referral sent on 7/12 & 7/15.   Contact information:   [address]  [place], [place] [address] [phone]      Follow up with [facility].   Why:  Patient agreeable to call to schedule outpatient services for therapy and medication management.   Contact information:   [address] [place], [place] [address] Phone: [phone] Fax: [phone]      Plan Of Care/Follow-up recommendations:  Activity:  as tolerated Diet:  regular Follow up [facility] and as above  Is patient on multiple antipsychotic therapies at discharge:  No   Has Patient had three or more failed trials of antipsychotic monotherapy by history:  No  Recommended Plan for Multiple Antipsychotic Therapies: NA    Derek Laughter A 11/26/2014, 11:12 AM

## 2014-11-26 NOTE — Clinical Social Work Note (Signed)
Referral faxed to [facility] at patient's request.   Lashelle Koy, MSW, LCSWA Clinical Social Worker [facility] [phone]  

## 2014-11-26 NOTE — Progress Notes (Signed)
Discharge note:  Patient received all personal belongings, prescriptions and medication samples.  Reviewed discharge instructions and medications with patient and he indicated understanding.  She denies SI/HI/AVH.  Patient left ambulatory for the bus station.

## 2014-11-26 NOTE — [facility] Group Notes (Signed)
   [facility]  11/26/2014  8:45 AM   Participation Quality: Alert, Appropriate and Oriented  Mood/Affect: Appropriate  Depression Rating: 4  Anxiety Rating: 5  Thoughts of Suicide: Pt denies SI/HI  Will you contract for safety? Yes  Current AVH: Pt denies  Plan for Discharge/Comments: Pt attended discharge planning group and actively participated in group. CSW provided pt with today's workbook. Patient plans to return to previous living situation to await [facility] bed.  Transportation Means: Pt reports access to transportation  Supports: No supports mentioned at this time  Samuella BruinKristin Constantina Laseter, MSW, Am[employer] IncLCSWA Clinical Social Worker [facility] [phone]

## 2014-11-26 NOTE — Progress Notes (Signed)
Recreation Therapy Notes  Date: 07.18.16 Time: 9:30 am Location: [address] Group Room  Group Topic: Stress Management  Goal Area(s) Addresses:  Patient will verbalize importance of using healthy stress management.  Patient will identify positive emotions associated with healthy stress management.   Intervention: Stress Management  Activity :  Guided Imagery Script.  LRT introduced and educated patients on the stress management technique of guided imagery.  A script was used to deliver the technique to patients.  Patients were asked to follow the script read a loud by LRT to engage in practicing the stress management technique.  Education:  Stress Management, Discharge Planning.   Education Outcome: Acknowledges edcuation/In group clarification offered/Needs additional education  Clinical Observations/Feedback: Patient did not attend group.   Pam Vanalstine, LRT/CTRS         Jaidee Stipe A 11/26/2014 2:10 PM 

## 2014-11-26 NOTE — Progress Notes (Signed)
Pt attended spiritual care group on grief and loss facilitated by chaplain Burnis KingfisherMatthew Jelisha Weed. Group opened with brief discussion and psycho-social ed around grief and loss in relationships and in relation to self - identifying life patterns, circumstances, changes that cause losses. Established group norm of speaking from own life experience. Group goal of establishing open and affirming space for members to share loss and experience with grief, normalize grief experience and provide psycho social education and grief support.  Group drew on narrative and Adlerian therapeutic modalities.   Randy [name] was present and attentive throughout group.  She did not contribute to group discussion.   Randy [name], [name] MDiv

## 2014-11-26 NOTE — Progress Notes (Signed)
  [facility] Discharge Plan :  Will you be returning to the same living situation after discharge:  Yes,  Patient plans to return to previous living situation At discharge, do you have transportation home?: Yes,  Patient requesting bus ticket Do you have the ability to pay for your medications: Yes,  patient will be provided with medication samples and prescriptions  Release of information consent forms completed and in the chart;  Patient's signature needed at discharge.  Patient to Follow up at: Follow-up Information    Follow up with [facility].   Why:  Please call Shayla weekly to inquire about status of referral sent on 7/12 & 7/15.   Contact information:   [address],  [place], [place] [address] [phone]      Follow up with [facility].   Why:  Patient agreeable to call to schedule outpatient services for therapy and medication management.   Contact information:   [address] [place], [place] [address] Phone: [phone] Fax: [phone]      Patient denies SI/HI: Yes,  denies    Safety Planning and Suicide Prevention discussed: Yes,  with patient  Have you used any form of tobacco in the last 30 days? (Cigarettes, Smokeless Tobacco, Cigars, and/or Pipes): Yes  Has patient been referred to the Quitline?: Patient refused referral  Jaycelyn Orrison, [place] CarboKristin L 11/26/2014, 10:21 AM

## 2014-11-26 NOTE — Discharge Summary (Signed)
Physician Discharge Summary Note  Patient:  Randy [name] is an 44 y.o., male MRN:  [id number] DOB:  [birth year] Patient phone:  [phone] (home)  Patient address:   [address] [place] [address],  Total Time spent with patient: Greater than 30 minutes  Date of Admission:  11/17/2014 Date of Discharge: 11/26/2014  Reason for Admission:  Severe depressive symptoms   Principal Problem: Major depressive disorder, recurrent, severe without psychotic features Discharge Diagnoses: Patient Active Problem List   Diagnosis Date Noted  . Major depressive disorder, recurrent, severe without psychotic features [F33.2]   . Opioid dependence with opioid-induced mood disorder [F11.24]   . Cocaine dependence with cocaine-induced mood disorder [F14.24]   . MDD (major depressive disorder) [F32.2] 09/17/2014  . MDD (major depressive disorder), recurrent severe, without psychosis [F33.2] 07/05/2014  . Major depression, recurrent [F33.9] 04/10/2012  . Cocaine dependence [F14.20] 04/10/2012  . Polysubstance abuse including IVDA (heroin), cocaine, marijuana [F19.10] 01/10/2012  . Elevated LFTs [R79.89] 01/10/2012  . Chronic hepatitis C without hepatic coma [B18.2] 01/10/2012  . 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 Tobacco abuse [Z72.0] 01/10/2012  . Anxiety disorder [F41.9] 08/14/2011  . Recurrent major depression-severe [F33.2] 08/13/2011    Musculoskeletal: Strength & Muscle Tone: within normal limits Gait & Station: normal Patient leans: N/A  Psychiatric Specialty Exam:  See Suicide Risk Assessment Physical Exam  Nursing note and vitals reviewed. Constitutional: He is oriented to person, place, and time.  Neck: Normal range of motion.  Respiratory: Effort normal.  Musculoskeletal: Normal range of motion.  Neurological: He is alert and oriented to person, place, and time.  Psychiatric: He has a normal mood and affect. His speech is normal and  behavior is normal. Judgment and thought content normal. Cognition and memory are normal.    Review of Systems  Constitutional: Negative.   HENT: Negative.   Eyes: Negative.   Respiratory: Negative.   Cardiovascular: Negative.   Gastrointestinal: Negative.   Genitourinary: Negative.   Musculoskeletal: Negative.   Skin: Negative.   Neurological: Negative.   Endo/Heme/Allergies: Negative.   Psychiatric/Behavioral: Positive for depression (Stable) and substance abuse (Positive for cocaine and opiates ). Negative for suicidal ideas, hallucinations and memory loss. The patient is nervous/anxious (Stable). Insomnia: Stable.   All other systems reviewed and are negative.   Blood pressure 108/72, pulse 92, temperature 98.3 F (36.8 C), temperature source Oral, resp. rate 16, height 5\' 6"  (1.676 m), weight 76.658 kg (169 lb), SpO2 99 %.Body mass index is 27.29 kg/(m^2).  Have you used any form of tobacco in the last 30 days? (Cigarettes, Smokeless Tobacco, Cigars, and/or Pipes): Yes  Has this patient used any form of tobacco in the last 30 days? (Cigarettes, Smokeless Tobacco, Cigars, and/or Pipes) Yes, A prescription for an FDA-approved tobacco cessation medication was offered at discharge and the patient refused  Past Medical History:  Past Medical History  Diagnosis Date  . Depression   . Hepatitis C   . 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 Cellulitis  Of  Left Forearm 01/10/2012    From IVDA    Past Surgical History  Procedure Laterality Date  . I&d extremity  01/13/2012    Procedure: IRRIGATION AND DEBRIDEMENT EXTREMITY;  Surgeon: Kennieth Rad, MD;  Location: [facility];  Service: Orthopedics;  Laterality: Left;   Family History:  Family History  Problem Relation Age of Onset  . Idiopathic pulmonary fibrosis Father    Social History:  History  Alcohol Use  . 0.0 oz/week    Comment: used once     History  Drug Use  . Yes  . Special: Heroin, "Crack" cocaine     Comment: heroin last used 07/14/14, crack    History   Social History  . Marital Status: Single    Spouse Name: N/A  . Number of Children: N/A  . Years of Education: N/A   Occupational History  . 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 Smoking status: Current Every Day Smoker -- 0.50 packs/day for 20 years    Types: Cigarettes  . Smokeless tobacco: Never Used     Comment: trying to quit-using e-cig some  . Alcohol Use: 0.0 oz/week     Comment: used once  . Drug Use: Yes    Special: Heroin, "Crack" cocaine     Comment: heroin last used 07/14/14, crack  . Sexual Activity:    Partners: Male    Birth Control/ Protection: Condom     Comment: Has been HIV tested in past and has been negative.   Other Topics Concern  . None   Social History Narrative   Transgendered male.  Single.  Lives alone.  Independent.   Risk to Self: Suicidal Ideation: No-Not Currently/Within Last 6 Months (can't contract for safety) Suicidal Intent: No-Not Currently/Within Last 6 Months Is patient at risk for suicide?: Yes Suicidal Plan?: No-Not Currently/Within Last 6 Months Access to Means: Yes Specify Access to Suicidal Means:  (pills) What has been your use of drugs/alcohol within the last 12 months?:  (used crack and heroin yesterday) Other Self Harm Risks: SA Intentional Self Injurious Behavior: None Risk to Others: Homicidal Ideation: No Thoughts of Harm to Others: No Current Homicidal Intent: No Current Homicidal Plan: No Access to Homicidal Means: No History of harm to others?: No Assessment of Violence: None Noted Does patient have access to weapons?: No Criminal Charges Pending?: No Does patient have a court date: No Prior Inpatient Therapy: Prior Inpatient Therapy: Yes Prior Therapy Dates: May 2016 Prior Therapy Facilty/Provider(s): [facility], [facility] Reason for Treatment: depression, SA Prior Outpatient Therapy: Prior Outpatient Therapy: Yes Prior Therapy  Dates: past 2 yrs Prior Therapy Facilty/Provider(s): [facility] Reason for Treatment: depression Does patient have an ACCT team?: No Does patient have Intensive In-House Services?  : No Does patient have [facility] services? : No Does patient have P4CC services?: No  Level of Care:  OP  Hospital Course:    Randy [name] is an 44 y.o. male transitioning to male who came in as a walk in to [facility] c/o increasing depression with intermittent suicidal ideation with a plan to overdose. She states that her life began going downhill about a month and a half ago when her friend died of a heroin overdose in her home. Patient came in to [facility] at that time for treatment, but says that she stopped paying rent on her apartment (becasue her friend died there), so her landlord kicked her out and "illegally got rid of all of my belongings". Patient was then homeless, and relapsed on heroin and crack about 5 weeks ago. Patient states she has been taking her meds prescribed by her outpatient provider of 2 yrs (Dr. Jannifer Franklin) "most of the time", but is having multiple depressive symptoms including sleeping 4 hrs/night, crying more than usual, difficulty concentrating and getting tasks done, and appetite loss. Patient states that she cannot contract for safety and is scared that  she will do something to hurt herself if she does not get help. 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 She has had difficulty completing these tasks recently due to depression.         Randy [name] was admitted to the adult 300 unit where she was evaluated and her symptoms were identified. Medication management was discussed and implemented. She was encouraged to participate in unit programming. Medical problems were identified and treated appropriately. The patient was continued on her hormones for gender transition.  Patient was restarted on her medication regimen that  included Abilify 5 mg daily and Lexapro 20 mg daily. She completed an ativan detox protocol to address previous use of benzos and to avoid potential complications such as seizures. Home medication was restarted as needed.  She was evaluated each day by a clinical provider to ascertain the patient's response to treatment.  Improvement was noted by the patient's report of decreasing symptoms, improved sleep and appetite, affect, medication tolerance, behavior, and participation in unit programming.  The patient was asked each day to complete a self inventory noting mood, mental status, pain, new symptoms, anxiety and concerns. Patient talked about her need for rehab and desire to have a stable life situation. On admission she was positive for opiates and cocaine. She was set up for further treatment at [facility] after discharge from [facility].          She responded well to medication and being in a therapeutic and supportive environment. Positive and appropriate behavior was noted and the patient was motivated for recovery.  She worked closely with the treatment team and case manager to develop a discharge plan with appropriate goals. Coping skills, problem solving as well as relaxation therapies were also part of the unit programming.         By the day of discharge she was in much improved condition than upon admission.  Symptoms were reported as significantly decreased or resolved completely. The patient denied SI/HI and voiced no AVH. She was motivated to continue taking medication with a goal of continued improvement in mental health.   Randy [name] was discharged home with a plan to follow up as noted below. The patient was provided with sample medications and prescriptions at time of discharge. She left [facility] in stable condition with all belongings returned to her.    Consults:  None  Significant Diagnostic Studies:  Chemistry panel, Lipid profile, CBC, Hemoglobin A1c, UDS positive for benzo, opiates, and cocaine    Discharge Vitals:   Blood pressure 108/72, pulse 92, temperature 98.3 F (36.8 C), temperature source Oral, resp. rate 16, height 5\' 6"  (1.676 m), weight 76.658 kg (169 lb), SpO2 99 %. Body mass index is 27.29 kg/(m^2). Lab Results:   No results found for this or any previous visit (from the past 72 hour(s)).  Physical Findings: AIMS: Facial and Oral Movements Muscles of Facial Expression: None, normal Lips and Perioral Area: None, normal Jaw: None, normal Tongue: None, normal,Extremity Movements Upper (arms, wrists, hands, fingers): None, normal Lower (legs, knees, ankles, toes): None, normal, Trunk Movements Neck, shoulders, hips: None, normal, Overall Severity Severity of abnormal movements (highest score from questions above): None, normal Incapacitation due to abnormal movements: None, normal Patient's awareness of abnormal movements (rate only patient's report): No Awareness, Dental Status Current problems with teeth and/or dentures?: No Does patient usually wear dentures?: No  CIWA:  CIWA-Ar Total:  0 COWS:  COWS Total Score: 4   See Psychiatric Specialty Exam and Suicide Risk Assessment completed by Attending Physician prior to discharge.  Discharge destination:  Home  Is patient on multiple antipsychotic therapies at discharge:  No   Has Patient had three or more failed trials of antipsychotic monotherapy by history:  No  Recommended Plan for Multiple Antipsychotic Therapies: NA      Discharge Instructions    Discharge instructions    Complete by:  As directed   Please follow up with your Primary Care Provider for medical concerns as scheduled.            Medication List    TAKE these medications      Indication   ARIPiprazole 5 MG tablet  Commonly known as:  ABILIFY  Take 1 tablet (5 mg total) by mouth daily.   Indication:  Major Depressive Disorder     escitalopram 20 MG tablet  Commonly known as:  LEXAPRO  Take 1 tablet (20 mg total) by mouth  daily.   Indication:  Depression     estradiol 2 MG tablet  Commonly known as:  ESTRACE  Take 1 tablet (2 mg total) by mouth 3 (three) times daily.   Indication:  Deficiency of the Hormone Estrogen     finasteride 5 MG tablet  Commonly known as:  PROSCAR  Take 1 tablet (5 mg total) by mouth daily.   Indication:  part of his hormonal therapy for transgendering     hydrOXYzine 25 MG tablet  Commonly known as:  ATARAX/VISTARIL  Take 1 tablet (25 mg total) by mouth at bedtime as needed for anxiety (Sleep).   Indication:  anxiety     LORazepam 0.5 MG tablet  Commonly known as:  ATIVAN  Take one tablet four times on (8am, noon, 5 pm, and 8pm)  11/26/14 Take one tablet three times (8am, noon, and 8 pm) on 11/27/14 Take one tablet two times on (8am and 8pm) 11/28/14 Take one tablet one time on (8am) 11/29/14 for Ativan taper   Indication:  Acute Alcohol Withdrawal Syndrome     multivitamin with minerals Tabs tablet  Take 1 tablet by mouth daily.   Indication:  Vitamin Supplementation     nicotine 21 mg/24hr patch  Commonly known as:  NICODERM CQ - dosed in mg/24 hours  Place 1 patch (21 mg total) onto the skin daily.   Indication:  Nicotine Addiction     pantoprazole 40 MG tablet  Commonly known as:  PROTONIX  Take 1 tablet (40 mg total) by mouth daily.   Indication:  Gastroesophageal Reflux Disease     spironolactone 50 MG tablet  Commonly known as:  ALDACTONE  Take 1 tablet (50 mg total) by mouth daily.   Indication:  High Blood Pressure, Medication regimen for transgender process     traZODone 100 MG tablet  Commonly known as:  DESYREL  Take 1 tablet (100 mg total) by mouth at bedtime as needed for sleep.   Indication:  Trouble Sleeping       Follow-up Information    Follow up with [facility].   Why:  Please call Shayla weekly to inquire about status of referral sent on 7/12 & 7/15.   Contact information:   [address],  [place], [place] [address] [phone]       Follow up with [facility].   Why:  Patient agreeable to call to schedule outpatient services for therapy and medication management.  Contact information:   [address] [place], [place] [address] Phone: [phone] Fax: [phone]      Follow-up recommendations:   Activity: as tolerated Diet: regular Follow up [facility] and as above   Comments:   Patient has been instructed to take medications as prescribed; and report adverse effects to outpatient provider.  Follow up with primary doctor for any medical issues and If symptoms recur report to nearest emergency or crisis hot line.    Total Discharge Time: Greater than 30 minutes  Signed: Fransisca Kaufmann, NP-C 11/27/2014, 9:00 AM  I personally assessed the patient and formulated the plan Madie Reno A. Dub Mikes, M.D.

## 2014-12-12 DIAGNOSIS — E281 Androgen excess: Secondary | ICD-10-CM | POA: Diagnosis not present

## 2014-12-19 ENCOUNTER — Ambulatory Visit: Payer: Medicare Other | Admitting: Family Medicine

## 2015-01-02 ENCOUNTER — Emergency Department (INDEPENDENT_AMBULATORY_CARE_PROVIDER_SITE_OTHER)
Admission: EM | Admit: 2015-01-02 | Discharge: 2015-01-02 | Disposition: A | Discharge status: Home / Self Care | Payer: Medicare Other | Source: Home / Self Care | Attending: Family Medicine | Admitting: Family Medicine

## 2015-01-02 ENCOUNTER — Encounter (HOSPITAL_COMMUNITY): Payer: Self-pay | Admitting: Emergency Medicine

## 2015-01-02 DIAGNOSIS — F329 Major depressive disorder, single episode, unspecified: Secondary | ICD-10-CM | POA: Diagnosis not present

## 2015-01-02 DIAGNOSIS — F32A Depression, unspecified: Secondary | ICD-10-CM

## 2015-01-02 MED ORDER — ESCITALOPRAM OXALATE 20 MG PO TABS: 20.0000 mg | ORAL_TABLET | Freq: Every day | ORAL | Status: DC

## 2015-01-02 NOTE — Discharge Instructions (Signed)

## 2015-01-02 NOTE — ED Notes (Signed)
Requesting lexapro refill

## 2015-01-02 NOTE — ED Provider Notes (Signed)
CSN: [id number]     Arrival date & time 01/02/15  1525 History   First MD Initiated Contact with Patient 01/02/15 1656     Chief Complaint  Patient presents with  . Medication Refill   (Consider location/radiation/quality/duration/timing/severity/associated sxs/prior Treatment) HPI Comments: 44 year old transgender male to male has a history of depression and anxiety. She is currently being seen at the behavioral health center and Andrey Campanile be seen by neuropsychiatric services with an appointment date on September 2. She is taking Lexapro 20 mg daily but has been out of her medicine for nearly a month. She is requesting a refill of medicine to get by until she is able to see her physician in about one week.   Past Medical History  Diagnosis Date  . Depression   . Hepatitis C   . 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 Cellulitis  Of  Left Forearm 01/10/2012    From IVDA   Past Surgical History  Procedure Laterality Date  . I&d extremity  01/13/2012    Procedure: IRRIGATION AND DEBRIDEMENT EXTREMITY;  Surgeon: Kennieth Rad, MD;  Location: [facility];  Service: Orthopedics;  Laterality: Left;   Family History  Problem Relation Age of Onset  . Idiopathic pulmonary fibrosis Father    Social History  Substance Use Topics  . Smoking status: Current Every Day Smoker -- 0.50 packs/day for 20 years    Types: Cigarettes  . Smokeless tobacco: Never Used     Comment: trying to quit-using e-cig some  . Alcohol Use: 0.0 oz/week     Comment: used once    Review of Systems  Constitutional: Negative for activity change and fatigue.  Respiratory: Negative.   Cardiovascular: Negative for chest pain.  Neurological: Negative for dizziness, syncope, facial asymmetry, speech difficulty and light-headedness.  Psychiatric/Behavioral: Negative.  Negative for suicidal ideas, behavioral problems, confusion and agitation. The patient is not hyperactive.     Allergies  Penicillins  Home  Medications   Prior to Admission medications   Medication Sig Start Date End Date Taking? Authorizing Provider  ARIPiprazole (ABILIFY) 5 MG tablet Take 1 tablet (5 mg total) by mouth daily. 11/26/14   Thermon Leyland, NP  escitalopram (LEXAPRO) 20 MG tablet Take 1 tablet (20 mg total) by mouth daily. 11/26/14   Thermon Leyland, NP  estradiol (ESTRACE) 2 MG tablet Take 1 tablet (2 mg total) by mouth 3 (three) times daily. 11/26/14   Thermon Leyland, NP  finasteride (PROSCAR) 5 MG tablet Take 1 tablet (5 mg total) by mouth daily. 11/26/14   Thermon Leyland, NP  hydrOXYzine (ATARAX/VISTARIL) 25 MG tablet Take 1 tablet (25 mg total) by mouth at bedtime as needed for anxiety (Sleep). 09/21/14   Shuvon B Rankin, NP  LORazepam (ATIVAN) 0.5 MG tablet Take one tablet four times on (8am, noon, 5 pm, and 8pm)  11/26/14 Take one tablet three times (8am, noon, and 8 pm) on 11/27/14 Take one tablet two times on (8am and 8pm) 11/28/14 Take one tablet one time on (8am) 11/29/14 for Ativan taper 11/26/14   Thermon Leyland, NP  Multiple Vitamin (MULTIVITAMIN WITH MINERALS) TABS tablet Take 1 tablet by mouth daily. 11/26/14   Thermon Leyland, NP  nicotine (NICODERM CQ - DOSED IN MG/24 HOURS) 21 mg/24hr patch Place 1 patch (21 mg total) onto the skin daily. 11/26/14   Thermon Leyland, NP  pantoprazole (PROTONIX) 40 MG tablet Take 1 tablet (40 mg total) by mouth daily. 11/26/14  Thermon Leyland, NP  spironolactone (ALDACTONE) 50 MG tablet Take 1 tablet (50 mg total) by mouth daily. 11/26/14   Thermon Leyland, NP  traZODone (DESYREL) 100 MG tablet Take 1 tablet (100 mg total) by mouth at bedtime as needed for sleep. 11/26/14   Thermon Leyland, NP   BP 114/64 mmHg  Pulse 58  Temp(Src) 97.3 F (36.3 C) (Oral)  Resp 18  SpO2 100% Physical Exam  Constitutional: He is oriented to person, place, and time. He appears well-developed and well-nourished. No distress.  Eyes: EOM are normal.  Neck: Normal range of motion. Neck supple.  Cardiovascular:  Normal rate.   Pulmonary/Chest: Effort normal. No respiratory distress.  Musculoskeletal: He exhibits no edema.  Neurological: He is alert and oriented to person, place, and time. He exhibits normal muscle tone.  Psychiatric: He has a normal mood and affect.  Nursing note and vitals reviewed.   ED Course  Procedures (including critical care time) Labs Review Labs Reviewed - No data to display  Imaging Review No results found.   MDM   1. Depression    Refill lexapro 20 mg #15    Hayden Rasmussen, NP 01/02/15 1711

## 2015-01-11 DIAGNOSIS — F331 Major depressive disorder, recurrent, moderate: Secondary | ICD-10-CM | POA: Diagnosis not present

## 2015-01-11 DIAGNOSIS — F411 Generalized anxiety disorder: Secondary | ICD-10-CM | POA: Diagnosis not present

## 2015-02-22 ENCOUNTER — Ambulatory Visit: Payer: Medicare Other | Admitting: Family Medicine

## 2015-02-22 ENCOUNTER — Telehealth: Payer: Self-pay | Admitting: Family Medicine

## 2015-02-22 DIAGNOSIS — Z0289 Encounter for other administrative examinations: Secondary | ICD-10-CM

## 2015-02-22 NOTE — Telephone Encounter (Signed)
Noted  

## 2015-02-22 NOTE — Telephone Encounter (Signed)
Pt called in at 9:50 am stating that she will need to reschedule her appt for two weeks because she doesn't currently have cab fare to come in to her appt. She says that she still have a cough but is still consistently smoking. She says that within the two week she will work on not smoking as much to help her cough. Pt has been rescheduled to 10/28 at 2:00p.      Thanks.

## 2015-02-27 NOTE — Telephone Encounter (Signed)
Yes- pt needs a no-show fee as this is a regular occurrence for her

## 2015-02-27 NOTE — Telephone Encounter (Signed)
Charge or no charge for no show? This is the 3rd in 1 year.

## 2015-03-08 ENCOUNTER — Ambulatory Visit: Payer: Medicare Other | Admitting: Family Medicine

## 2015-03-14 ENCOUNTER — Encounter: Payer: Self-pay | Admitting: Family Medicine

## 2015-03-14 ENCOUNTER — Ambulatory Visit (INDEPENDENT_AMBULATORY_CARE_PROVIDER_SITE_OTHER): Payer: Medicare Other | Admitting: Family Medicine

## 2015-03-14 VITALS — BP 140/82 | HR 97 | Temp 98.0°F | Resp 16 | Ht 66.0 in | Wt 181.2 lb

## 2015-03-14 DIAGNOSIS — B1920 Unspecified viral hepatitis C without hepatic coma: Secondary | ICD-10-CM | POA: Diagnosis not present

## 2015-03-14 DIAGNOSIS — R059 Cough, unspecified: Secondary | ICD-10-CM

## 2015-03-14 DIAGNOSIS — R05 Cough: Secondary | ICD-10-CM | POA: Diagnosis not present

## 2015-03-14 NOTE — Progress Notes (Signed)
   Subjective:    Patient ID: Randy [name], male    DOB: 25-Aug-1970, 44 y.o.   MRN: [id number]  HPI Cough- pt 1st made appt a few weeks ago b/c cough had 'black speckles in it'.  As weather has cooled and she is no longer running window AC, the cough has improved and the black specks have resolved.  Still smoking cigarettes- 1ppd.  No fevers.  Mild SOB.  Denies wheezing or chest tightness.  Pt has attempted to quit smoking in the past but mental health issues have prevented.  Hep C- pt would like referral back to Hep C clinic   Review of Systems For ROS see HPI     Objective:   Physical Exam  Constitutional: He appears well-developed and well-nourished. No distress.  HENT:  Head: Normocephalic and atraumatic.  No TTP over sinuses + turbinate edema + PND TMs normal bilaterally  Eyes: Conjunctivae and EOM are normal. Pupils are equal, round, and reactive to light.  Neck: Normal range of motion. Neck supple.  Cardiovascular: Normal rate, regular rhythm and normal heart sounds.   Pulmonary/Chest: Effort normal and breath sounds normal. No respiratory distress. He has no wheezes.  Lymphadenopathy:    He has no cervical adenopathy.  Skin: Skin is warm and dry.  Vitals reviewed.         Assessment & Plan:

## 2015-03-14 NOTE — Patient Instructions (Signed)
Schedule your complete physical at your convenience Start Zyrtec daily to decrease your post-nasal drip Drink plenty of fluids Mucinex DM (over the counter) to help w/ cough and congestion We'll call you with your Hep C appt Call with any questions or concerns Hang in there!!

## 2015-03-14 NOTE — Assessment & Plan Note (Signed)
Chronic problem.  Pt did not f/u as recommended w/ Hep C clinic.  Reports she wants to re-establish care.  Referral placed.

## 2015-03-14 NOTE — Progress Notes (Signed)
Pre visit review using our clinic review tool, if applicable. No additional management support is needed unless otherwise documented below in the visit note. 

## 2015-03-14 NOTE — Assessment & Plan Note (Signed)
New.  Suspect this is multifactorial- smoking, untreated seasonal allergies, possible previous infection.  Lungs CTAB today in office.  Offered CXR- pt declined.  Feels sxs are improving.  Start OTC antihistamine.  Cough meds prn.  Encouraged smoking cessation.

## 2015-03-15 DIAGNOSIS — F411 Generalized anxiety disorder: Secondary | ICD-10-CM | POA: Diagnosis not present

## 2015-03-15 DIAGNOSIS — F331 Major depressive disorder, recurrent, moderate: Secondary | ICD-10-CM | POA: Diagnosis not present

## 2015-04-15 ENCOUNTER — Other Ambulatory Visit: Payer: Medicare Other

## 2015-04-16 ENCOUNTER — Other Ambulatory Visit: Payer: Medicare Other

## 2015-04-19 ENCOUNTER — Other Ambulatory Visit: Payer: Medicare Other

## 2015-04-22 ENCOUNTER — Other Ambulatory Visit: Payer: Self-pay

## 2015-08-09 DIAGNOSIS — F331 Major depressive disorder, recurrent, moderate: Secondary | ICD-10-CM | POA: Diagnosis not present

## 2015-08-09 DIAGNOSIS — F411 Generalized anxiety disorder: Secondary | ICD-10-CM | POA: Diagnosis not present

## 2015-08-27 DIAGNOSIS — E875 Hyperkalemia: Secondary | ICD-10-CM | POA: Diagnosis not present

## 2015-08-27 DIAGNOSIS — R635 Abnormal weight gain: Secondary | ICD-10-CM | POA: Diagnosis not present

## 2015-08-27 DIAGNOSIS — K719 Toxic liver disease, unspecified: Secondary | ICD-10-CM | POA: Diagnosis not present

## 2015-08-27 DIAGNOSIS — N951 Menopausal and female climacteric states: Secondary | ICD-10-CM | POA: Diagnosis not present

## 2015-10-09 ENCOUNTER — Telehealth: Payer: Self-pay | Admitting: Family Medicine

## 2015-10-09 DIAGNOSIS — B182 Chronic viral hepatitis C: Secondary | ICD-10-CM

## 2015-10-09 NOTE — Telephone Encounter (Signed)
Referral has be sent °

## 2015-10-09 NOTE — Telephone Encounter (Signed)
Ok to [facility] but please stress importance of showing up to appts as providers will not tolerate multiple late arrivals or no-shows

## 2015-10-09 NOTE — Telephone Encounter (Signed)
Referral placed.

## 2015-10-09 NOTE — Telephone Encounter (Signed)
Per ID notes they are not going to accept patient until commitment to treatment has been discussed with patient as pt no-showed or came late on multiple times even with the assistance of ID providing bus passes for the patient.    These were in the referral notes from November referral.

## 2015-10-09 NOTE — Telephone Encounter (Signed)
Patient calling to request referral for treatment of Hep C testing and to have an ultrasound of liver.  Please see referral order to [facility], [phone].

## 2015-11-20 DIAGNOSIS — Z760 Encounter for issue of repeat prescription: Secondary | ICD-10-CM | POA: Diagnosis not present

## 2015-11-20 DIAGNOSIS — Z79899 Other long term (current) drug therapy: Secondary | ICD-10-CM | POA: Diagnosis not present

## 2015-11-20 DIAGNOSIS — F17200 Nicotine dependence, unspecified, uncomplicated: Secondary | ICD-10-CM | POA: Diagnosis not present

## 2015-11-20 DIAGNOSIS — Z880 Allergy status to penicillin: Secondary | ICD-10-CM | POA: Diagnosis not present

## 2016-02-26 DIAGNOSIS — K719 Toxic liver disease, unspecified: Secondary | ICD-10-CM | POA: Diagnosis not present

## 2016-02-26 DIAGNOSIS — F641 Gender identity disorder in adolescence and adulthood: Secondary | ICD-10-CM | POA: Diagnosis not present

## 2016-09-07 ENCOUNTER — Encounter (HOSPITAL_COMMUNITY): Payer: Self-pay | Admitting: Emergency Medicine

## 2016-09-07 ENCOUNTER — Inpatient Hospital Stay (HOSPITAL_COMMUNITY)
Admission: AD | Admit: 2016-09-07 | Discharge: 2016-09-11 | DRG: 885 | Disposition: A | Discharge status: Home / Self Care | Payer: Medicare Other | Source: Intra-hospital | Attending: Psychiatry | Admitting: Psychiatry

## 2016-09-07 ENCOUNTER — Encounter (HOSPITAL_COMMUNITY): Payer: Self-pay | Admitting: Behavioral Health

## 2016-09-07 ENCOUNTER — Emergency Department (HOSPITAL_COMMUNITY)
Admission: EM | Admit: 2016-09-07 | Discharge: 2016-09-07 | Disposition: A | Discharge status: Home / Self Care | Payer: Medicare Other | Attending: Emergency Medicine | Admitting: Emergency Medicine

## 2016-09-07 ENCOUNTER — Ambulatory Visit (HOSPITAL_COMMUNITY)
Admission: RE | Admit: 2016-09-07 | Discharge: 2016-09-07 | Disposition: A | Discharge status: Home / Self Care | Payer: Medicare Other | Source: Home / Self Care | Attending: Psychiatry | Admitting: Psychiatry

## 2016-09-07 ENCOUNTER — Encounter (HOSPITAL_COMMUNITY): Payer: Self-pay | Admitting: *Deleted

## 2016-09-07 DIAGNOSIS — Z880 Allergy status to penicillin: Secondary | ICD-10-CM | POA: Diagnosis not present

## 2016-09-07 DIAGNOSIS — F32A Depression, unspecified: Secondary | ICD-10-CM

## 2016-09-07 DIAGNOSIS — R05 Cough: Secondary | ICD-10-CM | POA: Diagnosis present

## 2016-09-07 DIAGNOSIS — F332 Major depressive disorder, recurrent severe without psychotic features: Secondary | ICD-10-CM | POA: Diagnosis not present

## 2016-09-07 DIAGNOSIS — Z79899 Other long term (current) drug therapy: Secondary | ICD-10-CM

## 2016-09-07 DIAGNOSIS — F419 Anxiety disorder, unspecified: Secondary | ICD-10-CM | POA: Diagnosis present

## 2016-09-07 DIAGNOSIS — F4310 Post-traumatic stress disorder, unspecified: Secondary | ICD-10-CM | POA: Diagnosis present

## 2016-09-07 DIAGNOSIS — F649 Gender identity disorder, unspecified: Secondary | ICD-10-CM | POA: Diagnosis present

## 2016-09-07 DIAGNOSIS — F1124 Opioid dependence with opioid-induced mood disorder: Secondary | ICD-10-CM | POA: Diagnosis present

## 2016-09-07 DIAGNOSIS — F329 Major depressive disorder, single episode, unspecified: Secondary | ICD-10-CM

## 2016-09-07 DIAGNOSIS — R45851 Suicidal ideations: Secondary | ICD-10-CM | POA: Diagnosis not present

## 2016-09-07 DIAGNOSIS — B182 Chronic viral hepatitis C: Secondary | ICD-10-CM | POA: Diagnosis present

## 2016-09-07 DIAGNOSIS — F1424 Cocaine dependence with cocaine-induced mood disorder: Secondary | ICD-10-CM | POA: Diagnosis present

## 2016-09-07 DIAGNOSIS — F17210 Nicotine dependence, cigarettes, uncomplicated: Secondary | ICD-10-CM | POA: Insufficient documentation

## 2016-09-07 LAB — ETHANOL: Alcohol, Ethyl (B): <5 mg/dL (ref <5)

## 2016-09-07 LAB — COMPREHENSIVE METABOLIC PANEL
ALBUMIN: 4.5 g/dL (ref 3.5–5.0)
ALT: 69 U/L — AB (ref 17–63)
AST: 37 U/L (ref 15–41)
Alkaline Phosphatase: 47 U/L (ref 38–126)
Anion gap: 8 (ref 5–15)
BUN: 12 mg/dL (ref 6–20)
CHLORIDE: 105 mmol/L (ref 101–111)
CO2: 23 mmol/L (ref 22–32)
Calcium: 9.3 mg/dL (ref 8.9–10.3)
Creatinine, Ser: 0.95 mg/dL (ref 0.61–1.24)
GFR calc Af Amer: >60 mL/min (ref >60)
GFR calc non Af Amer: >60 mL/min (ref >60)
Glucose, Bld: 114 mg/dL — ABNORMAL HIGH (ref 65–99)
Potassium: 3.6 mmol/L (ref 3.5–5.1)
SODIUM: 136 mmol/L (ref 135–145)
Total Bilirubin: 0.8 mg/dL (ref 0.3–1.2)
Total Protein: 7.5 g/dL (ref 6.5–8.1)

## 2016-09-07 LAB — ACETAMINOPHEN LEVEL: Acetaminophen (Tylenol), Serum: <10 ug/mL — ABNORMAL LOW (ref 10–30)

## 2016-09-07 LAB — CBC
HCT: 47 % (ref 39.0–52.0)
HEMOGLOBIN: 16.6 g/dL (ref 13.0–17.0)
MCH: 32 pg (ref 26.0–34.0)
MCHC: 35.3 g/dL (ref 30.0–36.0)
MCV: 90.6 fL (ref 78.0–100.0)
Platelets: 131 10*3/uL — ABNORMAL LOW (ref 150–400)
RBC: 5.19 MIL/uL (ref 4.22–5.81)
RDW: 12.9 % (ref 11.5–15.5)
WBC: 6.3 10*3/uL (ref 4.0–10.5)

## 2016-09-07 LAB — SALICYLATE LEVEL: Salicylate Lvl: <7 mg/dL (ref 2.8–30.0)

## 2016-09-07 MED ORDER — ENSURE ENLIVE PO LIQD: 237.0000 mL | Freq: Two times a day (BID) | ORAL | Status: DC

## 2016-09-07 MED ORDER — ACETAMINOPHEN 325 MG PO TABS: 650.0000 mg | ORAL_TABLET | ORAL | Status: DC | PRN

## 2016-09-07 MED ORDER — IBUPROFEN 200 MG PO TABS: 600.0000 mg | ORAL_TABLET | Freq: Three times a day (TID) | ORAL | Status: DC | PRN

## 2016-09-07 MED ORDER — ESTRADIOL 2 MG PO TABS
2.0000 mg | ORAL_TABLET | Freq: Three times a day (TID) | ORAL | Status: DC
  Administered 2016-09-07: 2 mg via ORAL
  Filled 2016-09-07: qty 1

## 2016-09-07 MED ORDER — ACETAMINOPHEN 325 MG PO TABS: 650.0000 mg | ORAL_TABLET | Freq: Four times a day (QID) | ORAL | Status: DC | PRN

## 2016-09-07 MED ORDER — HYDROXYZINE HCL 25 MG PO TABS
25.0000 mg | ORAL_TABLET | Freq: Four times a day (QID) | ORAL | Status: DC | PRN
  Filled 2016-09-07: qty 1

## 2016-09-07 MED ORDER — ALUM & MAG HYDROXIDE-SIMETH 200-200-20 MG/5ML PO SUSP: 30.0000 mL | ORAL | Status: DC | PRN

## 2016-09-07 MED ORDER — FINASTERIDE 5 MG PO TABS
5.0000 mg | ORAL_TABLET | Freq: Every day | ORAL | Status: DC
  Administered 2016-09-07: 5 mg via ORAL
  Filled 2016-09-07: qty 1

## 2016-09-07 MED ORDER — NICOTINE 21 MG/24HR TD PT24: 21.0000 mg | MEDICATED_PATCH | Freq: Every day | TRANSDERMAL | Status: DC

## 2016-09-07 MED ORDER — MAGNESIUM HYDROXIDE 400 MG/5ML PO SUSP: 30.0000 mL | Freq: Every day | ORAL | Status: DC | PRN

## 2016-09-07 MED ORDER — TRAZODONE HCL 50 MG PO TABS
50.0000 mg | ORAL_TABLET | Freq: Every evening | ORAL | Status: DC | PRN
  Filled 2016-09-07: qty 1

## 2016-09-07 NOTE — ED Notes (Signed)
Attempted to orient patient to room and unit.  She is sleeping soundly and refused to answer assessment questions.  15 minute checks and video monitoring in place.

## 2016-09-07 NOTE — ED Notes (Signed)
Bed: [facility] Expected date:  Expected time:  Means of arrival:  Comments: 
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
## 2016-09-07 NOTE — H&P (Signed)
Behavioral Health Medical Screening Exam  Randy [name] is an 46 y.o. male.  Total Time spent with patient: 30 minutes  Psychiatric Specialty Exam: Physical Exam  Nursing note and vitals reviewed. Constitutional: He is oriented to person, place, and time. He appears well-developed.  Neurological: He is alert and oriented to person, place, and time.  Psychiatric: He has a normal mood and affect. His behavior is normal.    Review of Systems  Psychiatric/Behavioral: Positive for depression and suicidal ideas. The patient is nervous/anxious.     There were no vitals taken for this visit.There is no height or weight on file to calculate BMI.  General Appearance: Guarded  Eye Contact:  Fair  Speech:  Clear and Coherent  Volume:  Normal  Mood:  Depressed and Irritable  Affect:  Depressed and Flat  Thought Process:  Coherent  Orientation:  Full (Time, Place, and Person)  Thought Content:  Hallucinations: None  Suicidal Thoughts:  Yes.  with intent/plan  Homicidal Thoughts:  No  Memory:  Immediate;   Fair Recent;   Fair Remote;   Fair  Judgement:  Fair  Insight:  Fair  Psychomotor Activity:  Restlessness  Concentration: Concentration: Fair  Recall:  [employer] of Knowledge:Fair  Language: Fair  Akathisia:  No  Handed:  Right  AIMS (if indicated):     Assets:  Desire for Improvement Resilience Social Support  Sleep:       Musculoskeletal: Strength & Muscle Tone: within normal limits Gait & Station: normal Patient leans: N/A  There were no vitals taken for this visit. BP:21/72 HR 100 RR 16 Temp: 97.6  Recommendations:  Based on my evaluation the patient does not appear to have an emergency medical condition.  Oneta Rack, NP 09/07/2016, 10:51 AM

## 2016-09-07 NOTE — ED Triage Notes (Signed)
Patient sent to ED from [facility] for medical clearance.  Patient reports that she needs to get back on her medications.   Patient denies SI or HI at this time.

## 2016-09-07 NOTE — [facility] Group Notes (Signed)
[facility] Group Notes:  (Nursing/MHT/Case Management/Adjunct)  Date:  09/07/2016  Time:  10:37 PM  Type of Therapy:  Adult Psychoeducational Group Note  Date:  09/07/2016 Time:  10:37 PM  Group Topic/Focus:  Self Esteem Action Plan:   The focus of this group is to help patients create a plan to continue to build self-esteem after discharge. Wrap-Up Group:   The focus of this group is to help patients review their daily goal of treatment and discuss progress on daily workbooks.  Participation Level:  Active  Participation Quality:  Inattentive  Affect:  Flat  Cognitive:  Alert and Lacking  Insight: Good  Engagement in Group:  None  Modes of Intervention:  Discussion  Additional Comments:  Pt participated minimally in group  Jamas Lav 09/07/2016, 10:37 PM  Participation Level:  Minimal  Participation Quality:  Attentive, Sharing and Supportive  Affect:  Flat  Cognitive:  Appropriate  Insight:  Good  Engagement in Group:  None  Modes of Intervention:  Discussion  Summary of Progress/Problems:  Jamas Lav 09/07/2016, 10:37 PM

## 2016-09-07 NOTE — Care Management Utilization Note (Signed)
   Per State Regulation 482.30  This chart was reviewed for necessity with respect to the patient's Admission/ Duration of stay.  Next review date: 09/11/16  Nicolasa Ducking RN, BSN

## 2016-09-07 NOTE — ED Notes (Addendum)
Patient has one book bag and one patient belonging bag at triage nurse station.  Patient wanded by [employer]

## 2016-09-07 NOTE — Tx Team (Signed)
Initial Treatment Plan 09/07/2016 5:36 PM Esiquio Boesen ZOX:[id number]    PATIENT STRESSORS: Medication change or noncompliance Substance abuse   PATIENT STRENGTHS: Ability for insight Capable of independent living   PATIENT IDENTIFIED PROBLEMS: "get back on medications"  "depression"  "grief" "anniversary of friend death"                  DISCHARGE CRITERIA:  Ability to meet basic life and health needs Adequate post-discharge living arrangements  PRELIMINARY DISCHARGE PLAN: Attend aftercare/continuing care group Attend PHP/IOP  PATIENT/FAMILY INVOLVEMENT: This treatment plan has been presented to and reviewed with the patient, Randy [name], and/or family member.  The patient and family have been given the opportunity to ask questions and make suggestions.  Layla Barter, RN 09/07/2016, 5:36 PM

## 2016-09-07 NOTE — ED Notes (Signed)
ED Provider at bedside. 

## 2016-09-07 NOTE — ED Notes (Signed)
Patient denies pain, verbalizes understanding of care plan. Belongings (one backpack and one plastic belongings bag) sent with [place].

## 2016-09-07 NOTE — Progress Notes (Signed)
09/07/16  1354:  LRT went to offer pt activities, pt was sleep.  Caroll Rancher, LRT/CTRS

## 2016-09-07 NOTE — Plan of Care (Signed)
Problem: Safety: Goal: Periods of time without injury will increase Outcome: Progressing Pt safe on the unit at this time   

## 2016-09-07 NOTE — BH Assessment (Signed)
[facility] Assessment Progress Note  Per Hillery Jacks, NP, this pt requires psychiatric hospitalization at this time.  Berneice Heinrich, RN, [facility] has assigned pt to [facility] Rm 400-1.  Pollyann Glen, TS, agrees to review consent forms with pt.  Pt's nurse, Kendal Hymen, has been notified, and agrees to send original paperwork along with pt via Juel Burrow, and to call report to [phone].  Doylene Canning, MA Triage Specialist [phone]

## 2016-09-07 NOTE — ED Notes (Signed)
Patient wanded 2nd time for transferring to [facility].

## 2016-09-07 NOTE — ED Provider Notes (Signed)
[facility]-EMERGENCY DEPT Provider Note   CSN: [id number] Arrival date & time: 09/07/16  0904     History   Chief Complaint Chief Complaint  Patient presents with  . Suicidal    HPI Randy [name] is a 46 y.o. male.  46 year old individual who presents for medical clearance for treatment of depression with suicidal ideations. Does have a history of suicide attempt in the past. Has been seen by their therapist and states that this is not been helping. Went to behavior health hospital and was sent here for medical clearance. Denies any intentional ingestions at this time. Denies responding to any internal stimuli. No homicidal ideations. No current substance abuse. Notes that use prior to arrival      Past Medical History:  Diagnosis Date  . Cellulitis  Of  Left Forearm 01/10/2012   From IVDA  . Depression   . Hepatitis C   . 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 Hepatitis C 03/14/2015  . Cough 03/14/2015  . Major depressive disorder, recurrent, severe without psychotic features (HCC)   . Opioid dependence with opioid-induced mood disorder (HCC)   . Cocaine dependence with cocaine-induced mood disorder (HCC)   . MDD (major depressive disorder) 09/17/2014  . MDD (major depressive disorder), recurrent severe, without psychosis (HCC) 07/05/2014  . Major depression, recurrent (HCC) 04/10/2012  . Cocaine dependence (HCC) 04/10/2012  . Polysubstance abuse including IVDA (heroin), cocaine, marijuana 01/10/2012  . Elevated LFTs 01/10/2012  . Chronic hepatitis C without hepatic coma (HCC) 01/10/2012  . 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 Tobacco abuse 01/10/2012  . Anxiety disorder 08/14/2011  . Recurrent major depression-severe (HCC) 08/13/2011    Past Surgical History:  Procedure Laterality Date  . I&D EXTREMITY  01/13/2012   Procedure: IRRIGATION AND DEBRIDEMENT EXTREMITY;  Surgeon: Kennieth Rad, MD;  Location: [facility] ORS;  Service: Orthopedics;  Laterality: Left;       Home Medications    Prior to Admission medications   Medication Sig Start Date End Date Taking? Authorizing Provider  ARIPiprazole (ABILIFY) 5 MG tablet Take 1 tablet (5 mg total) by mouth daily. Patient not taking: Reported on 03/14/2015 11/26/14   Thermon Leyland, NP  escitalopram (LEXAPRO) 20 MG tablet Take 1 tablet (20 mg total) by mouth daily. 01/02/15   Hayden Rasmussen, NP  estradiol (ESTRACE) 2 MG tablet Take 1 tablet (2 mg total) by mouth 3 (three) times daily. 11/26/14   Thermon Leyland, NP  finasteride (PROSCAR) 5 MG tablet Take 1 tablet (5 mg total) by mouth daily. 11/26/14   Thermon Leyland, NP  nicotine (NICODERM CQ - DOSED IN MG/24 HOURS) 21 mg/24hr patch Place 1 patch (21 mg total) onto the skin daily. Patient not taking: Reported on 03/14/2015 11/26/14   Thermon Leyland, NP  pantoprazole (PROTONIX) 40 MG tablet Take 1 tablet (40 mg total) by mouth daily. 11/26/14   Thermon Leyland, NP  spironolactone (ALDACTONE) 50 MG tablet Take 1 tablet (50 mg total) by mouth daily. Patient not taking: Reported on 03/14/2015 11/26/14   Thermon Leyland, NP    Family History Family History  Problem Relation Age of Onset  . Idiopathic pulmonary fibrosis Father     Social History Social History  Substance Use Topics  . Smoking status: Current Every Day Smoker    Packs/day: 0.50    Years: 20.00    Types: Cigarettes  .  Smokeless tobacco: Never Used     Comment: trying to quit-using e-cig some  . Alcohol use 0.0 oz/week     Allergies   Penicillins   Review of Systems Review of Systems  All other systems reviewed and are negative.    Physical Exam Updated Vital Signs BP 107/81 (BP Location: Left Arm)   Pulse 93   Temp 97.5 F (36.4 C) (Oral)   Ht  (1.676 m)   Wt 72.6 kg   SpO2 100%   BMI 25.82 kg/m   Physical Exam  Constitutional: He is oriented to person, place, and time. He appears well-developed and  well-nourished.  Non-toxic appearance. No distress.  HENT:  Head: Normocephalic and atraumatic.  Eyes: Conjunctivae, EOM and lids are normal. Pupils are equal, round, and reactive to light.  Neck: Normal range of motion. Neck supple. No tracheal deviation present. No thyroid mass present.  Cardiovascular: Normal rate, regular rhythm and normal heart sounds.  Exam reveals no gallop.   No murmur heard. Pulmonary/Chest: Effort normal and breath sounds normal. No stridor. No respiratory distress. He has no decreased breath sounds. He has no wheezes. He has no rhonchi. He has no rales.  Abdominal: Soft. Normal appearance and bowel sounds are normal. He exhibits no distension. There is no tenderness. There is no rebound and no CVA tenderness.  Musculoskeletal: Normal range of motion. He exhibits no edema or tenderness.  Neurological: He is alert and oriented to person, place, and time. He has normal strength. No cranial nerve deficit or sensory deficit. GCS eye subscore is 4. GCS verbal subscore is 5. GCS motor subscore is 6.  Skin: Skin is warm and dry. No abrasion and no rash noted.  Psychiatric: His speech is normal. His affect is blunt. He is withdrawn.  Nursing note and vitals reviewed.    ED Treatments / Results  Labs (all labs ordered are listed, but only abnormal results are displayed) Labs Reviewed  COMPREHENSIVE METABOLIC PANEL - Abnormal; Notable for the following:       Result Value   Glucose, Bld 114 (*)    ALT 69 (*)    All other components within normal limits  CBC - Abnormal; Notable for the following:    Platelets 131 (*)    All other components within normal limits  ETHANOL  SALICYLATE LEVEL  ACETAMINOPHEN LEVEL  RAPID URINE DRUG SCREEN, HOSP PERFORMED  I-STAT BETA HCG BLOOD, ED ([facility], [facility], [facility] ONLY)    EKG  EKG Interpretation None       Radiology No results found.  Procedures Procedures (including critical care time)  Medications Ordered in ED Medications -  No data to display   Initial Impression / Assessment and Plan / ED Course  I have reviewed the triage vital signs and the nursing notes.  Pertinent labs & imaging results that were available during my care of the patient were reviewed by me and considered in my medical decision making (see chart for details).     Patient to be medically cleared and then disposition by psychiatry  Final Clinical Impressions(s) / ED Diagnoses   Final diagnoses:  None    New Prescriptions New Prescriptions   No medications on file     Lorre Nick, MD 09/07/16 1023

## 2016-09-07 NOTE — Progress Notes (Signed)
D: Pt denies SI/HI/AVH. Pt is pleasant and cooperative. Pt stated she was feeling better this evening, pt isolates, to self but was in the dayroom for a little while, but continued to keep to herself.   A: Pt was offered support and encouragement. Pt was given scheduled medications. Pt was encourage to attend groups. Q 15 minute checks were done for safety.   R:Pt receptive to treatment and safety maintained on unit.

## 2016-09-07 NOTE — Progress Notes (Signed)
Admission note: Pt presented to [facility] adult unit from [facility] with flat affect and depressed mood. Pt tearful during admission process and had a difficult time answering questions. Pt stated that it's the anniversary of her friend beth's death. Pt endorses increased depression, anxiety and suicidal thoughts. Pt verbally contracts for safety at [facility].  Pt reported weaning herself off her meds 4 months ago after speaking with her doctor. Pt stated that she wanted to come off of the meds because she's been on meds since the early 90's. Pt denies AVH. Pt reports difficulty sleeping for the past month due to racing thoughts.   Pt denies using any illegal drugs although UDS is pos for cocaine, benzo's and opiates. Pt reports drinking 1-2 beers a week.

## 2016-09-07 NOTE — ED Notes (Signed)
Per [facility] staff pt being transported from [facility] for Medical Clearance; no rooms available at [facility] at present time. Pt recognizes self as male.

## 2016-09-07 NOTE — BH Assessment (Signed)
Patient accepted to [facility] for voluntary admit. Patient accepted to 400-1, MD [place].

## 2016-09-08 DIAGNOSIS — F332 Major depressive disorder, recurrent severe without psychotic features: Principal | ICD-10-CM

## 2016-09-08 MED ORDER — ESCITALOPRAM OXALATE 5 MG PO TABS
5.0000 mg | ORAL_TABLET | Freq: Every day | ORAL | Status: DC
  Administered 2016-09-08 – 2016-09-11 (×4): 5 mg via ORAL
  Filled 2016-09-08 (×7): qty 1

## 2016-09-08 MED ORDER — ARIPIPRAZOLE 2 MG PO TABS
2.0000 mg | ORAL_TABLET | Freq: Every day | ORAL | Status: DC
  Administered 2016-09-08 – 2016-09-09 (×2): 2 mg via ORAL
  Filled 2016-09-08 (×4): qty 1

## 2016-09-08 MED ORDER — ESTRADIOL 2 MG PO TABS
2.0000 mg | ORAL_TABLET | Freq: Three times a day (TID) | ORAL | Status: DC
  Administered 2016-09-08 – 2016-09-11 (×9): 2 mg via ORAL
  Filled 2016-09-08 (×4): qty 1
  Filled 2016-09-08: qty 2
  Filled 2016-09-08 (×8): qty 1
  Filled 2016-09-08: qty 2
  Filled 2016-09-08 (×2): qty 1

## 2016-09-08 MED ORDER — FINASTERIDE 5 MG PO TABS
2.5000 mg | ORAL_TABLET | Freq: Every day | ORAL | Status: DC
  Administered 2016-09-08 – 2016-09-11 (×4): 2.5 mg via ORAL
  Filled 2016-09-08 (×8): qty 0.5

## 2016-09-08 NOTE — Progress Notes (Signed)
DAR note: Randy [name] has been up and visible on the unit.  She denies any SI/HI or A/V hallucinations.  She reported that she was getting more depressed and felt like she needed some assistance before she got worse.  "This time I didn't need to be detoxed."  She denies any pain or discomfort and appears to be in no physical distress.  She completed her self inventory and reported that her depression was 5/10, hopelessness 7/10 and anxiety 8/10.  She wasn't able to report what her goal was for today.  Encouraged continued participation in group and unit activities.  Q 15 minute checks maintained for safety.  We will continue to monitor the progress towards her goals.

## 2016-09-08 NOTE — [facility] Group Notes (Signed)
[facility] Group Notes:  (Nursing/MHT/Case Management/Adjunct)  Date:  09/08/2016  Time:  0900 am  Type of Therapy:  Psychoeducational Skills  Participation Level:  Minimal  Participation Quality:  Attentive  Affect:  Appropriate  Cognitive:  Alert  Insight:  Good  Engagement in Group:  Lacking  Modes of Intervention:  Support  Summary of Progress/Problems: Patient came to group, however, she was asked to leave group to see her Child psychotherapist.  Patient is pleasant and states that she has an upcoming appointment for her therapist.  Cranford Mon 09/08/2016, 11:00 AM

## 2016-09-08 NOTE — Progress Notes (Signed)
NUTRITION ASSESSMENT  Pt identified as at risk on the Malnutrition Screen Tool  INTERVENTION: 1. Supplements: Continue Ensure Enlive po BID, each supplement provides 350 kcal and 20 grams of protein  NUTRITION DIAGNOSIS: Unintentional weight loss related to sub-optimal intake as evidenced by pt report.   Goal: Pt to meet >/= 90% of their estimated nutrition needs.  Monitor:  PO intake  Assessment:  Pt admitted with depression and polysubstance abuse (ETOH, benzos, opiates, cocaine). Pt with at least 10 lb of weight loss per chart. Pt has been ordered Ensure supplements, will continue order.   Height: Ht Readings from Last 1 Encounters:  09/07/16  (1.676 m)    Weight: Wt Readings from Last 1 Encounters:  09/07/16 150 lb (68 kg)    Weight Hx: Wt Readings from Last 10 Encounters:  09/07/16 150 lb (68 kg)  09/07/16 160 lb (72.6 kg)  03/14/15 181 lb 4 oz (82.2 kg)  11/17/14 169 lb (76.7 kg)  09/17/14 168 lb (76.2 kg)  07/14/14 170 lb (77.1 kg)  12/18/13 178 lb (80.7 kg)  11/26/13 166 lb (75.3 kg)  11/25/13 160 lb (72.6 kg)  10/20/13 169 lb (76.7 kg)    BMI:  Body mass index is 24.21 kg/m. Pt meets criteria for normal based on current BMI.  Estimated Nutritional Needs: Kcal: 25-30 kcal/kg Protein: > 1 gram protein/kg Fluid: 1 ml/kcal  Diet Order: Diet regular Room service appropriate? Yes; Fluid consistency: Thin Pt is also offered choice of unit snacks mid-morning and mid-afternoon.  Pt is eating as desired.   Lab results and medications reviewed.   Tilda Franco, MS, RD, LDN Pager: [phone] After Hours Pager: [phone]
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
## 2016-09-08 NOTE — [facility] Suicide Risk Assessment (Signed)
[facility] INPATIENT:  Family/Significant Other Suicide Prevention Education  Suicide Prevention Education:  Patient Refusal for Family/Significant Other Suicide Prevention Education: The patient Randy [name] has refused to provide written consent for family/significant other to be provided Family/Significant Other Suicide Prevention Education during admission and/or prior to discharge.  Physician notified.  Verdene Lennert 09/08/2016, 10:10 AM

## 2016-09-08 NOTE — [facility] Group Notes (Signed)
[facility] LCSW Group Therapy  09/08/2016 2:13 PM  Type of Therapy:  Group Therapy  Participation Level:  Active  Participation Quality:  Attentive  Affect:  Appropriate  Cognitive:  Alert and Oriented  Insight:  Improving  Engagement in Therapy:  Improving  Modes of Intervention:  Discussion, Education, Exploration, Problem-solving, Socialization and Support  Summary of Progress/Problems: MHA Speaker came to talk about his personal journey with substance abuse and addiction. The pt processed ways by which to relate to the speaker. MHA speaker provided handouts and educational information pertaining to groups and services offered by the [facility].   Ora Mcnatt N Smart LCSW 09/08/2016, 2:13 PM

## 2016-09-08 NOTE — Progress Notes (Signed)
Recreation Therapy Notes  Animal-Assisted Activity (AAA) Program Checklist/Progress Notes Patient Eligibility Criteria Checklist & Daily Group note for Rec TxIntervention  Date: 05.01.2018 Time: 2:45pm Location: [address]   AAA/T Program Assumption of Risk Form signed by Patient/ or Parent Legal Guardian Yes  Patient is free of allergies or sever asthma Yes  Patient reports no fear of animals Yes  Patient reports no history of cruelty to animals Yes  Patient understands his/her participation is voluntary Yes  Patient washes hands before animal contact Yes  Patient washes hands after animal contact Yes  Behavioral Response: Appropriate   Education:Hand Washing, Appropriate Animal Interaction   Education Outcome: Acknowledges education.   Clinical Observations/Feedback: Patient attended session and interacted appropriately with therapy dog and peers.   Marykay Lex Oneka Parada, LRT/CTRS        Jamell Opfer L 09/08/2016 3:03 PM
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
## 2016-09-08 NOTE — Progress Notes (Signed)
Patient attended the evening goals group and answered all questions prompted from this Clinical research associate. Patient stated she did not have a goal for the day but would think about one for tomorrow. Patient rated her day a 9 out of 10 and her affect was appropriate.
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
## 2016-09-08 NOTE — Tx Team (Signed)
Interdisciplinary Treatment and Diagnostic Plan Update  09/08/2016 Time of Session: 0930 Randy [name] MRN: [id number]  Principal Diagnosis: MDD  Secondary Diagnoses: Active Problems:   MDD (major depressive disorder), recurrent severe, without psychosis (HCC)   Current Medications:  Current Facility-Administered Medications  Medication Dose Route Frequency Provider Last Rate Last Dose  . acetaminophen (TYLENOL) tablet 650 mg  650 mg Oral Q6H PRN Thermon Leyland, NP      . alum & mag hydroxide-simeth (MAALOX/MYLANTA) 200-200-20 MG/5ML suspension 30 mL  30 mL Oral Q4H PRN Thermon Leyland, NP      . feeding supplement (ENSURE ENLIVE) (ENSURE ENLIVE) liquid 237 mL  237 mL Oral BID BM Rockey Situ Cobos, MD      . hydrOXYzine (ATARAX/VISTARIL) tablet 25 mg  25 mg Oral Q6H PRN Thermon Leyland, NP      . magnesium hydroxide (MILK OF MAGNESIA) suspension 30 mL  30 mL Oral Daily PRN Thermon Leyland, NP      . traZODone (DESYREL) tablet 50 mg  50 mg Oral QHS PRN Thermon Leyland, NP       PTA Medications: Prescriptions Prior to Admission  Medication Sig Dispense Refill Last Dose  . ARIPiprazole (ABILIFY) 5 MG tablet Take 1 tablet (5 mg total) by mouth daily. 30 tablet 0 Not Taking  . escitalopram (LEXAPRO) 20 MG tablet Take 1 tablet (20 mg total) by mouth daily. 15 tablet 0 Taking  . estradiol (ESTRACE) 2 MG tablet Take 1 tablet (2 mg total) by mouth 3 (three) times daily. 90 tablet 0 09/06/2016 at Unknown time  . finasteride (PROSCAR) 5 MG tablet Take 1 tablet (5 mg total) by mouth daily. (Patient taking differently: Take 2.5 mg by mouth daily. ) 30 tablet 0 09/06/2016 at Unknown time  . nicotine (NICODERM CQ - DOSED IN MG/24 HOURS) 21 mg/24hr patch Place 1 patch (21 mg total) onto the skin daily. (Patient not taking: Reported on 03/14/2015) 28 patch 0 Not Taking  . pantoprazole (PROTONIX) 40 MG tablet Take 1 tablet (40 mg total) by mouth daily. (Patient not taking: Reported on 09/07/2016) 30 tablet 0 Not Taking at  Unknown time  . spironolactone (ALDACTONE) 50 MG tablet Take 1 tablet (50 mg total) by mouth daily.   Not Taking    Patient Stressors: Medication change or noncompliance Substance abuse  Patient Strengths: Ability for insight Capable of independent living  Treatment Modalities: Medication Management, Group therapy, Case management,  1 to 1 session with clinician, Psychoeducation, Recreational therapy.   Physician Treatment Plan for Primary Diagnosis: MDD  Medication Management: Evaluate patient's response, side effects, and tolerance of medication regimen.  Therapeutic Interventions: 1 to 1 sessions, Unit Group sessions and Medication administration.  Evaluation of Outcomes: Progressing  Physician Treatment Plan for Secondary Diagnosis: Active Problems:   MDD (major depressive disorder), recurrent severe, without psychosis (HCC)  Long Term Goal(s):     Short Term Goals:       Medication Management: Evaluate patient's response, side effects, and tolerance of medication regimen.  Therapeutic Interventions: 1 to 1 sessions, Unit Group sessions and Medication administration.  Evaluation of Outcomes: Progressing   RN Treatment Plan for Primary Diagnosis: MDD Long Term Goal(s): Knowledge of disease and therapeutic regimen to maintain health will improve  Short Term Goals: Ability to remain free from injury will improve, Ability to disclose and discuss suicidal ideas and Ability to identify and develop effective coping behaviors will improve  Medication Management: RN will administer medications as  ordered by provider, will assess and evaluate patient's response and provide education to patient for prescribed medication. RN will report any adverse and/or side effects to prescribing provider.  Therapeutic Interventions: 1 on 1 counseling sessions, Psychoeducation, Medication administration, Evaluate responses to treatment, Monitor vital signs and CBGs as ordered, Perform/monitor  CIWA, COWS, AIMS and Fall Risk screenings as ordered, Perform wound care treatments as ordered.  Evaluation of Outcomes: Progressing   LCSW Treatment Plan for Primary Diagnosis: MDD Long Term Goal(s): Safe transition to appropriate next level of care at discharge, Engage patient in therapeutic group addressing interpersonal concerns.  Short Term Goals: Engage patient in aftercare planning with referrals and resources, Facilitate patient progression through stages of change regarding substance use diagnoses and concerns and Identify triggers associated with mental health/substance abuse issues  Therapeutic Interventions: Assess for all discharge needs, 1 to 1 time with Social worker, Explore available resources and support systems, Assess for adequacy in community support network, Educate family and significant other(s) on suicide prevention, Complete Psychosocial Assessment, Interpersonal group therapy.  Evaluation of Outcomes: Progressing   Progress in Treatment: Attending groups: No. New to unit. Continuing to assess.  Participating in groups: No. Taking medication as prescribed: Yes. Toleration medication: Yes. Family/Significant other contact made: No, will contact:  family member/supportive person if patient consents Patient understands diagnosis: Yes. Discussing patient identified problems/goals with staff: Yes. Medical problems stabilized or resolved: Yes. Denies suicidal/homicidal ideation: No. Passive SI/Able to contract for safety on the unit.  Issues/concerns per patient self-inventory: No. Other: n/a   New problem(s) identified: No, Describe:  n/a  New Short Term/Long Term Goal(s): elimination of SI thoughts; mood stabilization; medication management; development of comprehensive mental wellness plan.   Discharge Plan or Barriers: CSW assessing for appropriate referrals. Pt last admitted to [facility] in 2016 and was [facility] to [facility]. Pt  currently lives alone in apartment and works as an Tree surgeon.   Reason for Continuation of Hospitalization: Depression Medication stabilization Suicidal ideation   Estimated Length of Stay: 3-5 days   Attendees: Patient: 09/08/2016 8:29 AM  Physician: Dr. Jama Flavors MD 09/08/2016 8:29 AM  Nursing: Wilhelmenia Blase RN 09/08/2016 8:29 AM  RN Care Manager: Onnie Boer CM 09/08/2016 8:29 AM  Social Worker: Trula Slade, LCSW 09/08/2016 8:29 AM  Recreational Therapist: Juliann Pares 09/08/2016 8:29 AM  Other: Armandina Stammer NP; May Augustin NP 09/08/2016 8:29 AM  Other:  09/08/2016 8:29 AM  Other: 09/08/2016 8:29 AM    Scribe for Treatment Team: Ledell Peoples Smart, LCSW 09/08/2016 8:29 AM

## 2016-09-09 LAB — LIPID PANEL
CHOL/HDL RATIO: 3.4 ratio
Cholesterol: 147 mg/dL (ref 0–200)
HDL: 43 mg/dL (ref >40)
LDL CALC: 78 mg/dL (ref 0–99)
TRIGLYCERIDES: 132 mg/dL (ref <150)
VLDL: 26 mg/dL (ref 0–40)

## 2016-09-09 LAB — TSH: TSH: 1.439 u[IU]/mL (ref 0.350–4.500)

## 2016-09-09 MED ORDER — ARIPIPRAZOLE 5 MG PO TABS
5.0000 mg | ORAL_TABLET | Freq: Every day | ORAL | Status: DC
  Administered 2016-09-10 – 2016-09-11 (×2): 5 mg via ORAL
  Filled 2016-09-09 (×4): qty 1

## 2016-09-09 NOTE — Plan of Care (Signed)
Problem: Safety: Goal: Periods of time without injury will increase Outcome: Progressing Pt. remains a low fall risk, denies SI/HI/AVH at this time, Q 15 checks in effect.    

## 2016-09-09 NOTE — Progress Notes (Signed)
  DATA ACTION RESPONSE  Objective- Pt. is visible in room, seen resting in bed with eyes closed. Pt. was awaken for assessment.  Presents with a flat/depressed/irritable affect and mood. Pt. does not want to participate in discussion at this time. Subjective- Denies having any SI/HI/AVH/Pain. Pt. states "I don't feel it, I just want to go back to bed".  Remains safe on the unit.  1:1 interaction in private to establish rapport. Encouragement, education, & support given from staff. No meds. ordered at this time.  Safety maintained with Q 15 checks. Continues to follow treatment plan and will monitor closely. No additonal questions/concerns noted.

## 2016-09-09 NOTE — Progress Notes (Signed)
DAR Note: Randy [name] has been up and visible on the unit.  She has attended some groups and is up for some meals.  She will go to the day room and journal but remains isolative and has minimal interaction with peers/staff.  Encouraged her to seek out staff with questions or problems.  She denies any SI/HI or A/V hallucinations.  She denies any pain or discomfort and appears to be in no physical distress.  She completed her self inventory but did not provide a goal.  When asked she stated "I don't know."  She reported that her depression was 4/10, hopelessness 5/10 and her anxiety was 6/10.  Encouraged continued participation in group and unit activities.  Q 15 minute checks maintained for safety.  We will continue to monitor the progress towards her goals.  She remains safe on the unit.

## 2016-09-09 NOTE — Progress Notes (Signed)
Recreation Therapy Notes  Date: 09/09/16 Time: 0930 Location: 400 Hall Dayroom  Group Topic: Stress Management  Goal Area(s) Addresses:  Patient will verbalize importance of using healthy stress management.  Patient will identify positive emotions associated with healthy stress management.   Behavioral Response: Engaged  Intervention: Stress Management  Activity :  Guided Imagery.  LRT introduced the stress management technique of guided imagery.  LRT read a script to allow patients engage in the technique.  Patients were to follow along as the script was read by LRT to participate in the technique of guided imagery.  Education:  Stress Management, Discharge Planning.   Education Outcome: Acknowledges edcuation/In group clarification offered/Needs additional education  Clinical Observations/Feedback: Pt attended group.    Caroll Rancher, LRT/CTRS        Lillia Abed, Lanny Lipkin A 09/09/2016 11:12 AM

## 2016-09-09 NOTE — B[facility] Group Notes (Signed)
B[facility] LCSW Group Therapy 09/09/2016 1:15 PM  Type of Therapy: Group Therapy- Emotion Regulation  Participation Level: Pt invited. Did not attend.  Jonathon Jordan, MSW, LCSWA 09/09/2016 3:06 PM

## 2016-09-09 NOTE — Progress Notes (Signed)
D: Pt was flat isolative and withdrawn to room; remained in bed with eyes closed. Pt endorsed moderate depression and anxiety. Pt denies HI, SI, AVH or pain.  A: Medications offered as prescribed. All patient's questions and concerns addressed. Support, encouragement, and safe environment provided. Will continue to monitor for any changes. 15-minute safety checks continue. R: Pt was med compliant. Pt did not attend wrap-up group. Safety checks continue.

## 2016-09-09 NOTE — Progress Notes (Signed)
[facility] MD Progress Note  09/09/2016 8:43 AM Randy [name]  MRN:  [id number] Subjective:  Patient is feeling better than on admission, but still depressed . Reports some PTSD symptoms, mainly intrusive recollections, regarding the death of his friend last year, as well as an ongoing sense of grief about her loss. States " it did help me to stop using drugs, after that I have not wanted to touch drugs at all ".  Denies medication side effects. Objective : I have discussed case with treatment team and have met with patient . Patient presents with improving mood and range of affect. Denies any active SI at this time. Somewhat more visible on unit/ day room, less isolative . Denies medication side effects. As above, reports ongoing depression, sense of loss, related to her friend's death last year, as well as intrusive recollections regarding the traumatic experience .   Principal Problem:  MDD  Diagnosis:   Patient Active Problem List   Diagnosis Date Noted  . Hepatitis C [B19.20] 03/14/2015  . Cough [R05] 03/14/2015  . Major depressive disorder, recurrent, severe without psychotic features ([place]) [F33.2]   . Opioid dependence with opioid-induced mood disorder ([place]) [F11.24]   . Cocaine dependence with cocaine-induced mood disorder ([place]) [F14.24]   . MDD (major depressive disorder) [F32.9] 09/17/2014  . MDD (major depressive disorder), recurrent severe, without psychosis ([place]) [F33.2] 07/05/2014  . Major depression, recurrent ([place]) [F33.9] 04/10/2012  . Cocaine dependence ([place]) [F14.20] 04/10/2012  . Polysubstance abuse including IVDA (heroin), cocaine, marijuana [F19.10] 01/10/2012  . Elevated LFTs [R79.89] 01/10/2012  . Chronic hepatitis C without hepatic coma ([place]) [B18.2] 01/10/2012  . 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 Tobacco abuse [Z72.0] 01/10/2012  . Anxiety disorder [F41.9] 08/14/2011  . Recurrent major depression-severe ([place]) [F33.2] 08/13/2011    Total Time spent with patient: 20 minutes Past Medical History:  Past Medical History:  Diagnosis Date  . Cellulitis  Of  Left Forearm 01/10/2012   From IVDA  . Depression   . Hepatitis C   . 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 I&D EXTREMITY  01/13/2012   Procedure: IRRIGATION AND DEBRIDEMENT EXTREMITY;  Surgeon: Sharmon Revere, MD;  Location: [facility] ORS;  Service: Orthopedics;  Laterality: Left;   Family History:  Family History  Problem Relation Age of Onset  . Idiopathic pulmonary fibrosis Father    Social History:  History  Alcohol Use  . 0.0 oz/week     History  Drug Use  . Types: Heroin, "Crack" cocaine    Comment: heroin last used 07/14/14, crack    Social History   Social History  . Marital status: Single    Spouse name: N/A  . Number of children: N/A  . Years of education: N/A   Occupational History  . 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 Smoking status: Current Every Day Smoker    Packs/day: 0.50    Years: 20.00    Types: Cigarettes  . Smokeless tobacco: Never Used     Comment: trying to quit-using e-cig some  . Alcohol use 0.0 oz/week  . Drug use: Yes    Types: Heroin, "Crack" cocaine     Comment: heroin last used 07/14/14, crack  . Sexual activity: Yes    Partners: Male    Birth control/ protection: Condom     Comment: Has been HIV tested in  past and has been negative.   Other Topics Concern  . None   Social History Narrative   Transgendered male.  Single.  Lives alone.  Independent.   Additional Social History:   Sleep: improving   Appetite:  improving   Current Medications: Current Facility-Administered Medications  Medication Dose Route Frequency Provider Last Rate Last Dose  . acetaminophen (TYLENOL) tablet 650 mg  650 mg Oral Q6H PRN Niel Hummer, NP      . alum & mag hydroxide-simeth (MAALOX/MYLANTA) 200-200-20 MG/5ML  suspension 30 mL  30 mL Oral Q4H PRN Niel Hummer, NP      . ARIPiprazole (ABILIFY) tablet 2 mg  2 mg Oral Daily Jenne Campus, MD   2 mg at 09/09/16 0808  . escitalopram (LEXAPRO) tablet 5 mg  5 mg Oral Daily Jenne Campus, MD   5 mg at 09/09/16 0808  . estradiol (ESTRACE) tablet 2 mg  2 mg Oral TID Kerrie Buffalo, NP   2 mg at 09/09/16 0932  . feeding supplement (ENSURE ENLIVE) (ENSURE ENLIVE) liquid 237 mL  237 mL Oral BID BM Myer Peer Froylan Hobby, MD      . finasteride (PROSCAR) tablet 2.5 mg  2.5 mg Oral Daily Kerrie Buffalo, NP   2.5 mg at 09/08/16 1845  . hydrOXYzine (ATARAX/VISTARIL) tablet 25 mg  25 mg Oral Q6H PRN Niel Hummer, NP      . magnesium hydroxide (MILK OF MAGNESIA) suspension 30 mL  30 mL Oral Daily PRN Niel Hummer, NP      . traZODone (DESYREL) tablet 50 mg  50 mg Oral QHS PRN Niel Hummer, NP        Lab Results:  Results for orders placed or performed during the hospital encounter of 09/07/16 (from the past 48 hour(s))  TSH     Status: None   Collection Time: 09/09/16  6:05 AM  Result Value Ref Range   TSH 1.439 0.350 - 4.500 uIU/mL    Comment: Performed by a 3rd Generation assay with a functional sensitivity of <=0.01 uIU/mL. Performed at [facility] [place] [address] [place], [place] [address]     Blood Alcohol level:  Lab Results  Component Value Date   [facility] <5 09/07/2016   ETH <5 22/06/5425    Metabolic Disorder Labs: Lab Results  Component Value Date   HGBA1C 5.6 11/18/2014   MPG 114 11/18/2014   No results found for: PROLACTIN Lab Results  Component Value Date   CHOL 171 11/18/2014   TRIG 73 11/18/2014   HDL 32 (L) 11/18/2014   CHOLHDL 5.3 11/18/2014   VLDL 15 11/18/2014   LDLCALC 124 (H) 11/18/2014    Physical Findings: AIMS: Facial and Oral Movements Muscles of Facial Expression: None, normal Lips and Perioral Area: None, normal Jaw: None, normal Tongue: None, normal,Extremity Movements Upper (arms, wrists,  hands, fingers): None, normal Lower (legs, knees, ankles, toes): None, normal, Trunk Movements Neck, shoulders, hips: None, normal, Overall Severity Severity of abnormal movements (highest score from questions above): None, normal Incapacitation due to abnormal movements: None, normal Patient's awareness of abnormal movements (rate only patient's report): No Awareness, Dental Status Current problems with teeth and/or dentures?: No Does patient usually wear dentures?: No  CIWA:    COWS:     Musculoskeletal: Strength & Muscle Tone: within normal limits Gait & Station: normal Patient leans: N/A  Psychiatric Specialty Exam: Physical Exam  ROS no chest pain, no shortness of breath, no  vomiting   Blood pressure 100/67, pulse 81, temperature 98.5 F (36.9 C), temperature source Oral, resp. rate 20, height '5\' 6"'  (1.676 m), weight 68 kg (150 lb), SpO2 99 %.Body mass index is 24.21 kg/m.  General Appearance: Fairly Groomed  Eye Contact:  improved eye contact   Speech:  Normal Rate  Volume:  Decreased  Mood:  less depressed  Affect:  remains constricted, but reactive   Thought Process:  Linear and Descriptions of Associations: Intact  Orientation:  Full (Time, Place, and Person)  Thought Content:  denies hallucinations, no delusions, not internally preoccupied   Suicidal Thoughts:  No  Homicidal Thoughts:  No  Memory:  Recent and remote grossly intact   Judgement:  Other:  improving  Insight:  improving   Psychomotor Activity:  Normal  Concentration:  Concentration: Good and Attention Span: Good  Recall:  Good  Fund of Knowledge:  Good  Language:  Good  Akathisia:  Negative  Handed:  Right  AIMS (if indicated):     Assets:  Communication Skills Desire for Improvement Resilience  ADL's:  Intact  Cognition:  WNL  Sleep:  Number of Hours: 6.25   Assessment - patient remains depressed, but overall feeling better than on admission, and does present with an improving range of  affect. Denies any active SI.   Treatment Plan Summary: Daily contact with patient to assess and evaluate symptoms and progress in treatment, Medication management, Plan inpatient treatment  and medications as below Encourage group and milieu participation to work on coping skills and symptom reduction Continue Abilify 5 mgrs QDAY for mood disorder Continue Lexapro 5 mgrs QDAY for depression Continue Vistaril 25 mgrs Q 6 hours PRN for anxiety Continue Trazodone 50 mgrs QHS PRN for insomnia Treatment team working on disposition planning options  Jenne Campus, MD 09/09/2016, 8:43 AM

## 2016-09-10 LAB — PROLACTIN: PROLACTIN: 6.3 ng/mL (ref 4.0–15.2)

## 2016-09-10 LAB — HEMOGLOBIN A1C
HEMOGLOBIN A1C: 5.2 % (ref 4.8–5.6)
MEAN PLASMA GLUCOSE: 103 mg/dL

## 2016-09-10 NOTE — Progress Notes (Signed)
DATA ACTION RESPONSE  Objective- Pt. is visible in dayroom, seen reading a book. Presents with a flat/depressed affect and mood. Pt. made effort to program today and went to [employer]. Subjective- Denies having any SI/HI/AVH/Pain. Pt. states "I just came here to get back on my medications. I think I am leaving tomorrow". Remains safe on the unit.  1:1 interaction in private to establish rapport. Encouragement, education, & support given from staff. No meds. ordered at this time.  Safety maintained with Q 15 checks. Continues to follow treatment plan and will monitor closely. No additonal questions/concerns noted.

## 2016-09-10 NOTE — [facility] Group Notes (Signed)
[facility] LCSW Group Therapy 09/10/2016 1:15 PM  Type of Therapy: Group Therapy- Feelings about Diagnosis  Participation Level: Pt invited. Did not attend.   Jonathon JordanLynn B Faheem Ziemann, MSW, LCSWA 09/10/2016 2:35 PM

## 2016-09-10 NOTE — Progress Notes (Signed)
Adult Psychoeducational Group Note  Date:  09/10/2016 Time:  3:29 AM  Group Topic/Focus:  Wrap-Up Group:   The focus of this group is to help patients review their daily goal of treatment and discuss progress on daily workbooks.  Participation Level:  Did Not Attend  Participation Quality:  Patient did not attend wrap up group.  Affect:  Patient did not attend  Cognitive:  Patient did not attend  Insight: None  Engagement in Group:  Patient did not attend  Modes of Intervention:  Patient did not attend  Additional Comments: Patient did not attend wrap up group tonight.   Randy [name]  Randy [name] 09/10/2016, 3:29 AM

## 2016-09-10 NOTE — Progress Notes (Signed)
D: Pt presents with sad affect and depressed mood. Pt appears disheveled with brief eye contact on approach. Pt appears to be withdrawn and isolative to room. Pt have little engagement, no initiation and forwards little information.  Pt reports low energy level and poor concentration on self inventory sheet.  Pt denies self harm thoughts today. Pt reports tolerating meds well. No side effects to meds verbalized by pt. A: Medications reviewed with pt. Medications administered as ordered per MD. Verbal support provided. Pt encouraged to attend scheduled groups. 15 minute checks performed for safety. R: Pt safety maintained.

## 2016-09-10 NOTE — Progress Notes (Signed)
[facility] MD Progress Note  09/10/2016 2:00 PM Norvil Martensen  MRN:  [id number] Subjective:patient continues to feel better , she states she is less severely  depressed, denies any current suicidal ideations. Denies medication side effects . Objective : I have discussed case with treatment team and have met with patient . Staff reports patient is presenting with a partially improved affect, but still presenting sad, isolative . Patient presents with improving mood and range of affect, less neuro-vegetative symptoms of depression . As she improves she is starting to focus more on disposition planning .Plans to return to her home after discharge, and proceed with outpatient treatment . Denies medication side effects at this time. Tolerating medications well .  No disruptive or agitated behaviors on unit, limited group participation   Principal Problem:  MDD  Diagnosis:   Patient Active Problem List   Diagnosis Date Noted  . Hepatitis C [B19.20] 03/14/2015  . Cough [R05] 03/14/2015  . Major depressive disorder, recurrent, severe without psychotic features ([place]) [F33.2]   . Opioid dependence with opioid-induced mood disorder ([place]) [F11.24]   . Cocaine dependence with cocaine-induced mood disorder ([place]) [F14.24]   . MDD (major depressive disorder) [F32.9] 09/17/2014  . MDD (major depressive disorder), recurrent severe, without psychosis ([place]) [F33.2] 07/05/2014  . Major depression, recurrent ([place]) [F33.9] 04/10/2012  . Cocaine dependence ([place]) [F14.20] 04/10/2012  . Polysubstance abuse including IVDA (heroin), cocaine, marijuana [F19.10] 01/10/2012  . Elevated LFTs [R79.89] 01/10/2012  . Chronic hepatitis C without hepatic coma ([place]) [B18.2] 01/10/2012  . 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 Tobacco abuse [Z72.0] 01/10/2012  . Anxiety disorder [F41.9] 08/14/2011  . Recurrent major depression-severe ([place]) [F33.2] 08/13/2011   Total Time spent with patient: 20  minutes Past Medical History:  Past Medical History:  Diagnosis Date  . Cellulitis  Of  Left Forearm 01/10/2012   From IVDA  . Depression   . Hepatitis C   . 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 I&D EXTREMITY  01/13/2012   Procedure: IRRIGATION AND DEBRIDEMENT EXTREMITY;  Surgeon: Sharmon Revere, MD;  Location: [facility];  Service: Orthopedics;  Laterality: Left;   Family History:  Family History  Problem Relation Age of Onset  . Idiopathic pulmonary fibrosis Father    Social History:  History  Alcohol Use  . 0.0 oz/week     History  Drug Use  . Types: Heroin, "Crack" cocaine    Comment: heroin last used 07/14/14, crack    Social History   Social History  . Marital status: Single    Spouse name: N/A  . Number of children: N/A  . Years of education: N/A   Occupational History  . 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 Smoking status: Current Every Day Smoker    Packs/day: 0.50    Years: 20.00    Types: Cigarettes  . Smokeless tobacco: Never Used     Comment: trying to quit-using e-cig some  . Alcohol use 0.0 oz/week  . Drug use: Yes    Types: Heroin, "Crack" cocaine     Comment: heroin last used 07/14/14, crack  . Sexual activity: Yes    Partners: Male    Birth control/ protection: Condom     Comment: Has been HIV tested in past and has been negative.   Other Topics Concern  . None   Social History Narrative  Transgendered male.  Single.  Lives alone.  Independent.   Additional Social History:   Sleep: fair- improving   Appetite:  improving   Current Medications: Current Facility-Administered Medications  Medication Dose Route Frequency Provider Last Rate Last Dose  . acetaminophen (TYLENOL) tablet 650 mg  650 mg Oral Q6H PRN Niel Hummer, NP      . alum & mag hydroxide-simeth (MAALOX/MYLANTA) 200-200-20 MG/5ML suspension 30 mL  30 mL Oral Q4H PRN  Niel Hummer, NP      . ARIPiprazole (ABILIFY) tablet 5 mg  5 mg Oral Daily Jenne Campus, MD   5 mg at 09/10/16 0820  . escitalopram (LEXAPRO) tablet 5 mg  5 mg Oral Daily Jenne Campus, MD   5 mg at 09/10/16 0819  . estradiol (ESTRACE) tablet 2 mg  2 mg Oral TID Kerrie Buffalo, NP   2 mg at 09/10/16 1137  . finasteride (PROSCAR) tablet 2.5 mg  2.5 mg Oral Daily Kerrie Buffalo, NP   2.5 mg at 09/10/16 4827  . hydrOXYzine (ATARAX/VISTARIL) tablet 25 mg  25 mg Oral Q6H PRN Niel Hummer, NP      . magnesium hydroxide (MILK OF MAGNESIA) suspension 30 mL  30 mL Oral Daily PRN Niel Hummer, NP      . traZODone (DESYREL) tablet 50 mg  50 mg Oral QHS PRN Niel Hummer, NP        Lab Results:  Results for orders placed or performed during the hospital encounter of 09/07/16 (from the past 48 hour(s))  TSH     Status: None   Collection Time: 09/09/16  6:05 AM  Result Value Ref Range   TSH 1.439 0.350 - 4.500 uIU/mL    Comment: Performed by a 3rd Generation assay with a functional sensitivity of <=0.01 uIU/mL. Performed at [facility], [place] [address], [place], [place] [address]   Lipid panel     Status: None   Collection Time: 09/09/16  6:05 AM  Result Value Ref Range   Cholesterol 147 0 - 200 mg/dL   Triglycerides 132 <150 mg/dL   HDL 43 >40 mg/dL   Total CHOL/HDL Ratio 3.4 RATIO   VLDL 26 0 - 40 mg/dL   LDL Cholesterol 78 0 - 99 mg/dL    Comment:        Total Cholesterol/HDL:CHD Risk Coronary Heart Disease Risk Table                     Men   Women  1/2 Average Risk   3.4   3.3  Average Risk       5.0   4.4  2 X Average Risk   9.6   7.1  3 X Average Risk  23.4   11.0        Use the calculated Patient Ratio above and the CHD Risk Table to determine the patient's CHD Risk.        ATP III CLASSIFICATION (LDL):  <100     mg/dL   Optimal  100-129  mg/dL   Near or Above                    Optimal  130-159  mg/dL   Borderline  160-189  mg/dL   High  >190      mg/dL   Very High Performed at [facility] [address], [place], [place] [address]   Hemoglobin A1c  Status: None   Collection Time: 09/09/16  6:05 AM  Result Value Ref Range   Hgb A1c MFr Bld 5.2 4.8 - 5.6 %    Comment: (NOTE)         Pre-diabetes: 5.7 - 6.4         Diabetes: >6.4         Glycemic control for adults with diabetes: <7.0    Mean Plasma Glucose 103 mg/dL    Comment: (NOTE) Performed At: [facility] [place], [place] [id number] Lindon Romp MD [id number] Performed at [facility], [place] [address], [place], [place] [address]   Prolactin     Status: None   Collection Time: 09/09/16  6:05 AM  Result Value Ref Range   Prolactin 6.3 4.0 - 15.2 ng/mL    Comment: (NOTE) Performed At: [facility] [place], [place] [id number] Lindon Romp MD [id number] Performed at [facility], [place] [address], [place], [place] [address]     Blood Alcohol level:  Lab Results  Component Value Date   [facility] <5 09/07/2016   ETH <5 30/16/0109    Metabolic Disorder Labs: Lab Results  Component Value Date   HGBA1C 5.2 09/09/2016   MPG 103 09/09/2016   MPG 114 11/18/2014   Lab Results  Component Value Date   PROLACTIN 6.3 09/09/2016   Lab Results  Component Value Date   CHOL 147 09/09/2016   TRIG 132 09/09/2016   HDL 43 09/09/2016   CHOLHDL 3.4 09/09/2016   VLDL 26 09/09/2016   LDLCALC 78 09/09/2016   LDLCALC 124 (H) 11/18/2014    Physical Findings: AIMS: Facial and Oral Movements Muscles of Facial Expression: None, normal Lips and Perioral Area: None, normal Jaw: None, normal Tongue: None, normal,Extremity Movements Upper (arms, wrists, hands, fingers): None, normal Lower (legs, knees, ankles, toes): None, normal, Trunk Movements Neck, shoulders, hips: None, normal, Overall Severity Severity of abnormal movements (highest score from questions above): None,  normal Incapacitation due to abnormal movements: None, normal Patient's awareness of abnormal movements (rate only patient's report): No Awareness, Dental Status Current problems with teeth and/or dentures?: No Does patient usually wear dentures?: No  CIWA:    COWS:     Musculoskeletal: Strength & Muscle Tone: within normal limits Gait & Station: normal Patient leans: N/A  Psychiatric Specialty Exam: Physical Exam  ROS no chest pain, no shortness of breath, no vomiting   Blood pressure 110/69, pulse 75, temperature 97.6 F (36.4 C), temperature source Oral, resp. rate 16, height _0  (1.676 m), weight 68 kg (150 lb), SpO2 99 %.Body mass index is 24.21 kg/m.  General Appearance: Fairly Groomed  Eye Contact:  improved eye contact   Speech:  Normal Rate  Volume:  soft   Mood:  remains depressed, but improving compared to admission  Affect:  less constricted, smiles at times appropriately   Thought Process:  Goal Directed and Descriptions of Associations: Intact  Orientation:  Other:  fully alert and attentive   Thought Content:  no psychotic symptoms  Suicidal Thoughts:  No denies any suicidal or self injurious ideations, denies any homicidal or violent ideations   Homicidal Thoughts:  No  Memory:  Recent and remote grossly intact   Judgement:  Other:  improving  Insight:  improving   Psychomotor Activity:  Normal  Concentration:  Concentration: Good and Attention Span: Good  Recall:  Good  Fund of Knowledge:  Good  Language:  Good  Akathisia:  Negative  Handed:  Right  AIMS (if indicated):     Assets:  Communication Skills Desire for Improvement Resilience  ADL's:  Intact  Cognition:  WNL  Sleep:  Number of Hours: 5   Assessment - patient remains depressed, but is feeling better than on admission, and is starting to focus more on discharge planning . He is interested in continuing outpatient treatment after discharge. Currently denies any medication side effects.    Treatment Plan Summary: Treatment plan reviewed as below 5/3.  Daily contact with patient to assess and evaluate symptoms and progress in treatment, Medication management, Plan inpatient treatment  and medications as below Encourage group and milieu participation to work on coping skills and symptom reduction Continue Abilify 5 mgrs QDAY for mood disorder Continue Lexapro 5 mgrs QDAY for depression Continue Vistaril 25 mgrs Q 6 hours PRN for anxiety Continue Trazodone 50 mgrs QHS PRN for insomnia Treatment team working on disposition planning options  Jenne Campus, MD 09/10/2016, 2:00 PMPatient ID: Kyra Manges, male   DOB: 07/04/1970, 46 y.o.   MRN: [id number]

## 2016-09-10 NOTE — Plan of Care (Signed)
Problem: Activity: Goal: Interest or engagement in activities will improve Outcome: Progressing Pt. went to karaoke this evening provided with encouragement.

## 2016-09-10 NOTE — [facility] Group Notes (Signed)

## 2016-09-11 MED ORDER — TRAZODONE HCL 50 MG PO TABS: 50.0000 mg | ORAL_TABLET | Freq: Every evening | ORAL | 0 refills | Status: DC | PRN

## 2016-09-11 MED ORDER — HYDROXYZINE HCL 25 MG PO TABS: 25.0000 mg | ORAL_TABLET | Freq: Four times a day (QID) | ORAL | 0 refills | Status: DC | PRN

## 2016-09-11 MED ORDER — ESCITALOPRAM OXALATE 5 MG PO TABS: 5.0000 mg | ORAL_TABLET | Freq: Every day | ORAL | 0 refills | Status: DC

## 2016-09-11 MED ORDER — ESTRADIOL 2 MG PO TABS: 2.0000 mg | ORAL_TABLET | Freq: Three times a day (TID) | ORAL | 0 refills | Status: DC

## 2016-09-11 MED ORDER — ARIPIPRAZOLE 5 MG PO TABS: 5.0000 mg | ORAL_TABLET | Freq: Every day | ORAL | 0 refills | Status: DC

## 2016-09-11 MED ORDER — FINASTERIDE 5 MG PO TABS: 2.5000 mg | ORAL_TABLET | Freq: Every day | ORAL | 0 refills | Status: DC

## 2016-09-11 NOTE — Discharge Summary (Signed)
Physician Discharge Summary Note  Patient:  Randy [name] is an 46 y.o., male MRN:  [id number] DOB:  [birth year] Patient phone:  [phone] (home)  Patient address:   [address] [place] [address],  Total Time spent with patient: 30 minutes  Date of Admission:  09/07/2016 Date of Discharge: 09/11/2016  Reason for Admission:  Worsening depression  Principal Problem: MDD (major depressive disorder), recurrent severe, without psychosis Randy [name]) Discharge Diagnoses: Patient Active Problem List   Diagnosis Date Noted  . MDD (major depressive disorder), recurrent severe, without psychosis (HCC) [F33.2] 07/05/2014    Priority: High  . Hepatitis C [B19.20] 03/14/2015  . Cough [R05] 03/14/2015  . Major depressive disorder, recurrent, severe without psychotic features (HCC) [F33.2]   . Opioid dependence with opioid-induced mood disorder (HCC) [F11.24]   . Cocaine dependence with cocaine-induced mood disorder (HCC) [F14.24]   . MDD (major depressive disorder) [F32.9] 09/17/2014  . Major depression, recurrent (HCC) [F33.9] 04/10/2012  . Cocaine dependence (HCC) [F14.20] 04/10/2012  . Polysubstance abuse including IVDA (heroin), cocaine, marijuana [F19.10] 01/10/2012  . Elevated LFTs [R79.89] 01/10/2012  . Chronic hepatitis C without hepatic coma (HCC) [B18.2] 01/10/2012  . 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 Tobacco abuse [Z72.0] 01/10/2012  . Anxiety disorder [F41.9] 08/14/2011  . Recurrent major depression-severe (HCC) [F33.2] 08/13/2011    Past Psychiatric History: see HPI  Past Medical History:  Past Medical History:  Diagnosis Date  . Cellulitis  Of  Left Forearm 01/10/2012   From IVDA  . Depression   . Hepatitis C   . 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 I&D EXTREMITY  01/13/2012   Procedure: IRRIGATION AND DEBRIDEMENT EXTREMITY;  Surgeon: Randy Rad, MD;   Location: [facility];  Service: Orthopedics;  Laterality: Left;   Family History:  Family History  Problem Relation Age of Onset  . Idiopathic pulmonary fibrosis Father    Family Psychiatric  History: see HPI Social History:  History  Alcohol Use  . 0.0 oz/week     History  Drug Use  . Types: Heroin, "Crack" cocaine    Comment: heroin last used 07/14/14, crack    Social History   Social History  . Marital status: Single    Spouse name: N/A  . Number of children: N/A  . Years of education: N/A   Occupational History  . 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 Smoking status: Current Every Day Smoker    Packs/day: 0.50    Years: 20.00    Types: Cigarettes  . Smokeless tobacco: Never Used     Comment: trying to quit-using e-cig some  . Alcohol use 0.0 oz/week  . Drug use: Yes    Types: Heroin, "Crack" cocaine     Comment: heroin last used 07/14/14, crack  . Sexual activity: Yes    Partners: Male    Birth control/ protection: Condom     Comment: Has been HIV tested in past and has been negative.   Other Topics Concern  . None   Social History Narrative   Transgendered male.  Single.  Lives alone.  Independent.    [name] Course:  Randy [name], 46 year old, identifies self as transgender, male to male. Stated had been feeling more depressed, anxious recently  With thoughts of suicide, preferring to die.  Randy [name] was admitted for MDD (major depressive  disorder), recurrent severe, without psychosis (HCC) and crisis management.  Patient was treated with medications with their indications listed below in detail under Medication List.  Medical problems were identified and treated as needed.  Home medications were restarted as appropriate.  Improvement was monitored by observation and Randy [name] daily report of symptom reduction.  Emotional and mental status was monitored by daily self inventory reports completed by Randy [name] and clinical  staff.  Patient reported continued improvement, denied any new concerns.  Patient had been compliant on medications and denied side effects.  Support and encouragement was provided.    Patient encouraged to attend groups to help with recognizing triggers of emotional crises and de-stabilizations.  Patient encouraged to attend group to help identify the positive things in life that would help in dealing with feelings of loss, depression and unhealthy or abusive tendencies.         Randy [name] was evaluated by the treatment team for stability and plans for continued recovery upon discharge.  Patient was offered further treatment options upon discharge including Residential, Intensive Outpatient and Outpatient treatment. Patient will follow up with agency listed below for medication management and counseling.  Encouraged patient to maintain satisfactory support network and home environment.  Advised to adhere to medication compliance and outpatient treatment follow up.  Prescriptions provided.       Randy [name] motivation was an integral factor for scheduling further treatment.  Employment, transportation, bed availability, health status, family support, and any pending legal issues were also considered during patient's [name] stay.  Upon completion of this admission the patient was both mentally and medically stable for discharge denying suicidal/homicidal ideation, auditory/visual/tactile hallucinations, delusional thoughts and paranoia.      Physical Findings: AIMS: Facial and Oral Movements Muscles of Facial Expression: None, normal Lips and Perioral Area: None, normal Jaw: None, normal Tongue: None, normal,Extremity Movements Upper (arms, wrists, hands, fingers): None, normal Lower (legs, knees, ankles, toes): None, normal, Trunk Movements Neck, shoulders, hips: None, normal, Overall Severity Severity of abnormal movements (highest score from questions above): None, normal Incapacitation due to  abnormal movements: None, normal Patient's awareness of abnormal movements (rate only patient's report): No Awareness, Dental Status Current problems with teeth and/or dentures?: No Does patient usually wear dentures?: No  CIWA:    COWS:     Musculoskeletal: Strength & Muscle Tone: within normal limits Gait & Station: normal Patient leans: N/A  Psychiatric Specialty Exam: Physical Exam  Nursing note and vitals reviewed.   ROS  Blood pressure 100/72, pulse 68, temperature 97.7 F (36.5 C), resp. rate 16, height 5\' 6"  (1.676 m), weight 68 kg (150 lb), SpO2 99 %.Body mass index is 24.21 kg/m.    Have you used any form of tobacco in the last 30 days? (Cigarettes, Smokeless Tobacco, Cigars, and/or Pipes): Yes  Has this patient used any form of tobacco in the last 30 days? (Cigarettes, Smokeless Tobacco, Cigars, and/or Pipes) Yes, N/A  Blood Alcohol level:  Lab Results  Component Value Date   [name] <5 09/07/2016   ETH <5 07/14/2014    Metabolic Disorder Labs:  Lab Results  Component Value Date   HGBA1C 5.2 09/09/2016   MPG 103 09/09/2016   MPG 114 11/18/2014   Lab Results  Component Value Date   PROLACTIN 6.3 09/09/2016   Lab Results  Component Value Date   CHOL 147 09/09/2016   TRIG 132 09/09/2016   HDL 43 09/09/2016   CHOLHDL 3.4 09/09/2016   VLDL 26  09/09/2016   LDLCALC 78 09/09/2016   LDLCALC 124 (H) 11/18/2014    See Psychiatric Specialty Exam and Suicide Risk Assessment completed by Attending Physician prior to discharge.  Discharge destination:  Home  Is patient on multiple antipsychotic therapies at discharge:  No   Has Patient had three or more failed trials of antipsychotic monotherapy by history:  No  Recommended Plan for Multiple Antipsychotic Therapies: NA   Allergies as of 09/11/2016      Reactions   Penicillins Other (See Comments)   Swollen joints       Medication List    STOP taking these medications   nicotine 21 mg/24hr  patch Commonly known as:  NICODERM CQ - dosed in mg/24 hours   pantoprazole 40 MG tablet Commonly known as:  PROTONIX   spironolactone 50 MG tablet Commonly known as:  ALDACTONE     TAKE these medications     Indication  ARIPiprazole 5 MG tablet Commonly known as:  ABILIFY Take 1 tablet (5 mg total) by mouth daily. Start taking on:  09/12/2016  Indication:  mood stabilization   escitalopram 5 MG tablet Commonly known as:  LEXAPRO Take 1 tablet (5 mg total) by mouth daily. Start taking on:  09/12/2016 What changed:  medication strength  how much to take  Indication:  Generalized Anxiety Disorder   estradiol 2 MG tablet Commonly known as:  ESTRACE Take 1 tablet (2 mg total) by mouth 3 (three) times daily.  Indication:  Deficiency of the Hormone Estrogen   finasteride 5 MG tablet Commonly known as:  PROSCAR Take 0.5 tablets (2.5 mg total) by mouth daily. Take 1/2 tablet Start taking on:  09/12/2016 What changed:  how much to take  additional instructions  Indication:  part of his hormonal therapy for transgendering   hydrOXYzine 25 MG tablet Commonly known as:  ATARAX/VISTARIL Take 1 tablet (25 mg total) by mouth every 6 (six) hours as needed for anxiety.  Indication:  Anxiety Neurosis   traZODone 50 MG tablet Commonly known as:  DESYREL Take 1 tablet (50 mg total) by mouth at bedtime as needed for sleep.  Indication:  Trouble Sleeping      Follow-up Information    [facility] Follow up on 09/16/2016.   Specialty:  Behavioral Health Why:  at 1:00pm with Dr. Cheyenne Adasinger for your initial assessment. At this appointment you will be scheduled with a counselor and psychiatrist. Please arrive 10-15 minutes early and bring your insurance card. Contact information: [address] [place] [address] [phone]           Follow-up recommendations:  Activity:  as tol Diet:  as tol  Comments:  1.  Take all your medications as prescribed.   2.  Report  any adverse side effects to outpatient provider. 3.  Patient instructed to not use alcohol or illegal drugs while on prescription medicines. 4.  In the event of worsening symptoms, instructed patient to call 911, the crisis hotline or go to nearest emergency room for evaluation of symptoms.  Signed: [place]wood QuaSheila May Jibril Mcminn, NP [name] 09/11/2016, 10:39 AM

## 2016-09-11 NOTE — Tx Team (Signed)
Interdisciplinary Treatment and Diagnostic Plan Update  09/11/2016 Time of Session: [place] MRN: [id number]  Principal Diagnosis: MDD  Secondary Diagnoses: Active Problems:   MDD (major depressive disorder), recurrent severe, without psychosis ([place])   Current Medications:  Current Facility-Administered Medications  Medication Dose Route Frequency Provider Last Rate Last Dose  . acetaminophen (TYLENOL) tablet 650 mg  650 mg Oral Q6H PRN Niel Hummer, NP      . alum & mag hydroxide-simeth (MAALOX/MYLANTA) 200-200-20 MG/5ML suspension 30 mL  30 mL Oral Q4H PRN Niel Hummer, NP      . ARIPiprazole (ABILIFY) tablet 5 mg  5 mg Oral Daily Jenne Campus, MD   5 mg at 09/11/16 1030  . escitalopram (LEXAPRO) tablet 5 mg  5 mg Oral Daily Jenne Campus, MD   5 mg at 09/11/16 1314  . estradiol (ESTRACE) tablet 2 mg  2 mg Oral TID Kerrie Buffalo, NP   2 mg at 09/11/16 3888  . finasteride (PROSCAR) tablet 2.5 mg  2.5 mg Oral Daily Kerrie Buffalo, NP   2.5 mg at 09/11/16 7579  . hydrOXYzine (ATARAX/VISTARIL) tablet 25 mg  25 mg Oral Q6H PRN Niel Hummer, NP      . magnesium hydroxide (MILK OF MAGNESIA) suspension 30 mL  30 mL Oral Daily PRN Niel Hummer, NP      . traZODone (DESYREL) tablet 50 mg  50 mg Oral QHS PRN Niel Hummer, NP       PTA Medications: Prescriptions Prior to Admission  Medication Sig Dispense Refill Last Dose  . ARIPiprazole (ABILIFY) 5 MG tablet Take 1 tablet (5 mg total) by mouth daily. 30 tablet 0 Not Taking  . escitalopram (LEXAPRO) 20 MG tablet Take 1 tablet (20 mg total) by mouth daily. 15 tablet 0 Taking  . estradiol (ESTRACE) 2 MG tablet Take 1 tablet (2 mg total) by mouth 3 (three) times daily. 90 tablet 0 Unknown at Unknown time  . finasteride (PROSCAR) 5 MG tablet Take 1 tablet (5 mg total) by mouth daily. (Patient taking differently: Take 2.5 mg by mouth daily. ) 30 tablet 0 Unknown at Unknown time  . nicotine (NICODERM CQ - DOSED IN MG/24 HOURS) 21  mg/24hr patch Place 1 patch (21 mg total) onto the skin daily. (Patient not taking: Reported on 03/14/2015) 28 patch 0 Not Taking  . pantoprazole (PROTONIX) 40 MG tablet Take 1 tablet (40 mg total) by mouth daily. (Patient not taking: Reported on 09/07/2016) 30 tablet 0 Not Taking at Unknown time  . spironolactone (ALDACTONE) 50 MG tablet Take 1 tablet (50 mg total) by mouth daily.   Not Taking    Patient Stressors: Medication change or noncompliance Substance abuse  Patient Strengths: Ability for insight Capable of independent living  Treatment Modalities: Medication Management, Group therapy, Case management,  1 to 1 session with clinician, Psychoeducation, Recreational therapy.   Physician Treatment Plan for Primary Diagnosis: MDD  Medication Management: Evaluate patient's response, side effects, and tolerance of medication regimen.  Therapeutic Interventions: 1 to 1 sessions, Unit Group sessions and Medication administration.  Evaluation of Outcomes: Met  Physician Treatment Plan for Secondary Diagnosis: Active Problems:   MDD (major depressive disorder), recurrent severe, without psychosis ([place])  Long Term Goal(s): Improvement in symptoms so as ready for discharge Improvement in symptoms so as ready for discharge   Short Term Goals: Ability to verbalize feelings will improve Ability to disclose and discuss suicidal ideas Ability to demonstrate self-control will  improve Ability to identify and develop effective coping behaviors will improve Ability to maintain clinical measurements within normal limits will improve Compliance with prescribed medications will improve Ability to verbalize feelings will improve Ability to disclose and discuss suicidal ideas Ability to demonstrate self-control will improve Ability to identify and develop effective coping behaviors will improve Ability to maintain clinical measurements within normal limits will improve     Medication Management:  Evaluate patient's response, side effects, and tolerance of medication regimen.  Therapeutic Interventions: 1 to 1 sessions, Unit Group sessions and Medication administration.  Evaluation of Outcomes: Met  RN Treatment Plan for Primary Diagnosis: MDD Long Term Goal(s): Knowledge of disease and therapeutic regimen to maintain health will improve  Short Term Goals: Ability to remain free from injury will improve, Ability to disclose and discuss suicidal ideas and Ability to identify and develop effective coping behaviors will improve  Medication Management: RN will administer medications as ordered by provider, will assess and evaluate patient's response and provide education to patient for prescribed medication. RN will report any adverse and/or side effects to prescribing provider.  Therapeutic Interventions: 1 on 1 counseling sessions, Psychoeducation, Medication administration, Evaluate responses to treatment, Monitor vital signs and CBGs as ordered, Perform/monitor CIWA, COWS, AIMS and Fall Risk screenings as ordered, Perform wound care treatments as ordered.  Evaluation of Outcomes: Met  LCSW Treatment Plan for Primary Diagnosis: MDD Long Term Goal(s): Safe transition to appropriate next level of care at discharge, Engage patient in therapeutic group addressing interpersonal concerns.  Short Term Goals: Engage patient in aftercare planning with referrals and resources, Facilitate patient progression through stages of change regarding substance use diagnoses and concerns and Identify triggers associated with mental health/substance abuse issues  Therapeutic Interventions: Assess for all discharge needs, 1 to 1 time with Social worker, Explore available resources and support systems, Assess for adequacy in community support network, Educate family and significant other(s) on suicide prevention, Complete Psychosocial Assessment, Interpersonal group therapy.  Evaluation of Outcomes:  Met  Progress in Treatment: Attending groups: Yes Participating in groups: Yes Taking medication as prescribed: Yes. Toleration medication: Yes. Family/Significant other contact made: SPE completed with pt; pt declined to consent to family contact.  Patient understands diagnosis: Yes. Discussing patient identified problems/goals with staff: Yes. Medical problems stabilized or resolved: Yes. Denies suicidal/homicidal ideation: Yes, self report  Issues/concerns per patient self-inventory: No. Other: n/a   New problem(s) identified: No, Describe:  n/a  New Short Term/Long Term Goal(s): elimination of SI thoughts; mood stabilization; medication management; development of comprehensive mental wellness plan.   Discharge Plan or Barriers: Pt to return home; follow-up at [facility]. Bus pass provided. Pt given [facility].   Reason for Continuation of Hospitalization: none   Estimated Length of Stay: discharge today  Attendees: Patient: 09/11/2016 10:23 AM  Physician: Dr. Parke Poisson MD 09/11/2016 10:23 AM  Nursing: Andre Lefort RN 09/11/2016 10:23 AM  RN Care Manager: Lars Pinks CM 09/11/2016 10:23 AM  Social Worker: Maxie Better, LCSW 09/11/2016 10:23 AM  Recreational Therapist: Rhunette Croft 09/11/2016 10:23 AM  Other: Lindell Spar NP; May Augustin NP 09/11/2016 10:23 AM  Other:  09/11/2016 10:23 AM  Other: 09/11/2016 10:23 AM    Scribe for Treatment Team: Kimber Relic Smart, LCSW 09/11/2016 10:23 AM

## 2016-09-11 NOTE — [facility] Suicide Risk Assessment (Signed)
[facility]   Principal Problem: MDD (major depressive disorder), recurrent severe, without psychosis ([facility]) Discharge Diagnoses:  Patient Active Problem List   Diagnosis Date Noted  . Hepatitis C [B19.20] 03/14/2015  . Cough [R05] 03/14/2015  . Major depressive disorder, recurrent, severe without psychotic features (HCC) [F33.2]   . Opioid dependence with opioid-induced mood disorder (HCC) [F11.24]   . Cocaine dependence with cocaine-induced mood disorder (HCC) [F14.24]   . MDD (major depressive disorder) [F32.9] 09/17/2014  . MDD (major depressive disorder), recurrent severe, without psychosis ([facility]CC) [F33.2] 07/05/2014  . Major depression, recurrent (HCC) [F33.9] 04/10/2012  . Cocaine dependence (HCC) [F14.20] 04/10/2012  . Polysubstance abuse including IVDA (heroin), cocaine, marijuana [F19.10] 01/10/2012  . Elevated LFTs [R79.89] 01/10/2012  . Chronic hepatitis C without hepatic coma (HCC) [B18.2] 01/10/2012  . 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 Tobacco abuse [Z72.0] 01/10/2012  . Anxiety disorder [F41.9] 08/14/2011  . Recurrent major depression-severe (HCC) [F33.2] 08/13/2011    Total Time spent with patient: 30 minutes  Musculoskeletal: Strength & Muscle Tone: within normal limits Gait & Station: normal Patient leans: N/A  Psychiatric Specialty Exam: ROS denies headache, no chest pain, no shortness of breath  Blood pressure 100/72, pulse 68, temperature 97.7 F (36.5 C), resp. rate 16, height 5\' 6"  (1.676 m), weight 68 kg (150 lb), SpO2 99 %.Body mass index is 24.21 kg/m.  General Appearance: Well Groomed  Eye Contact::  Good  Speech:  Normal Rate409  Volume:  Normal  Mood:  improved mood, states he feels significantly better than on admission   Affect:  Appropriate and more reactive  Thought Process:  Linear and Descriptions of Associations: Intact  Orientation:  Full (Time, Place, and Person)  Thought  Content:  no hallucinations, no delusions, not internally preoccupied   Suicidal Thoughts:  No denies any suicidal or self injurious ideations, denies any homicidal or violent ideations   Homicidal Thoughts:  No  Memory:  recent and remote grossly intact  Judgement:  Other:  improving   Insight:  improving   Psychomotor Activity:  Normal  Concentration:  Good  Recall:  Good  Fund of Knowledge:Good  Language: Good  Akathisia:  Negative  Handed:  Right  AIMS (if indicated):    denies movement disorders, and no abnormal involuntary movements noted at this time  Assets:  Desire for Improvement Housing Resilience  Sleep:  Number of Hours: 4.25  Cognition: WNL  ADL's:  Intact   Mental Status Per Nursing Assessment::   On Admission:  Suicidal ideation indicated by patient, Suicide plan  Demographic Factors:  46 year old - identifies self as transgender male to male, lives alone, on disability   Loss Factors: Death of a friend last year, limited support system, disability, estranged from family   Historical Factors: History of depression, history of prior psychiatric admissions, history of substance abuse, now in sustained remission  Risk Reduction Factors:   Positive coping skills or problem solving skills  Continued Clinical Symptoms:  At this time patient is alert and attentive, well groomed, mood is reported as improved, minimizes depression at this time, affect is appropriate, reactive, no thought disorder, no suicide  Ideations, no homicidal ideations  Cognitive Features That Contribute To Risk:  No gross cognitive deficits noted upon discharge. Is alert , attentive, and oriented x 3   Suicide Risk:  Mild:  Suicidal ideation of limited frequency, intensity, duration, and specificity.  There are no identifiable plans, no associated  intent, mild dysphoria and related symptoms, good self-control (both objective and subjective assessment), few other risk factors, and  identifiable protective factors, including available and accessible social support.  Follow-up Information    [facility] Follow up on 09/16/2016.   Specialty:  Behavioral Health Why:  at 1:00pm with Dr. Cheyenne Adas for your initial assessment. At this appointment you will be scheduled with a counselor and psychiatrist. Please arrive 10-15 minutes early and bring your insurance card. Contact information: [address] [place] [address] [phone]           Plan Of Care/Follow-up recommendations:  Activity:  as tolerated  Diet:  regular Tests:  NA Other:  See below  Patient is leaving unit in good spirits Planning to return home. Plans to follow up as above .  Plans to follow up with specialist at [facility] ( Dr. Ruby Cola)  for hormonal therapy   Craige Cotta, MD 09/11/2016, 1:01 PM

## 2016-09-11 NOTE — Progress Notes (Signed)
Pt discharged home on a bus pass. Pt was ambulatory, stable and appreciative at that time. All papers and prescriptions were given and valuables returned. Verbal understanding expressed. Denies SI/HI and A/VH. Pt given opportunity to express concerns and ask questions.  

## 2016-09-11 NOTE — Progress Notes (Signed)
  [facility] :  Will you be returning to the same living situation after discharge:  Yes,  return home At discharge, do you have transportation home?: Yes,  Bus pass Do you have the ability to pay for your medications: Yes,  medicare  Release of information consent forms completed and submitted to medical records by CSW.  Patient to Follow up at: Follow-up Information    [facility] Follow up on 09/16/2016.   Specialty:  Behavioral Health Why:  at 1:00pm with Dr. Cheyenne Adasinger for your initial assessment. At this appointment you will be scheduled with a counselor and psychiatrist. Please arrive 10-15 minutes early and bring your insurance card. Contact information: [address] [place] [id number] [phone]           Next level of care provider [facility]:no  Safety Planning and Suicide Prevention discussed: Yes,  SPE completed with pt; pt declined to consent to family contact.  Have you used any form of tobacco in the last 30 days? (Cigarettes, Smokeless Tobacco, Cigars, and/or Pipes): Yes  Has patient been referred to the Quitline?: Patient refused referral  Patient has been referred for addiction treatment: Yes  Greyson Peavy N Smart LCSW 09/11/2016, 10:22 AM

## 2016-09-11 NOTE — Progress Notes (Signed)
Recreation Therapy Notes  Date: 09/11/16 Time: 0930 Location: [address] Dayroom  Group Topic: Stress Management  Goal Area(s) Addresses:  Patient will verbalize importance of using healthy stress management.  Patient will identify positive emotions associated with healthy stress management.   Behavioral Response: Engaged  Intervention: Stress Management  Activity :  Meditation.  LRT introduced the stress management technique of meditation.  LRT played a meditation on the importance of letting go of past events and be in the moment.  Patients were to follow along as the meditation played to fully engage in the technique.  Education:  Stress Management, Discharge Planning.   Education Outcome: Acknowledges edcuation/In group clarification offered/Needs additional education  Clinical Observations/Feedback: Pt attended group.    Caroll RancherMarjette Anush Wiedeman, LRT/CTRS        Lillia AbedLindsay, Plummer Matich A 09/11/2016 11:06 AM

## 2016-09-14 DIAGNOSIS — F641 Gender identity disorder in adolescence and adulthood: Secondary | ICD-10-CM | POA: Diagnosis not present

## 2016-09-14 DIAGNOSIS — K719 Toxic liver disease, unspecified: Secondary | ICD-10-CM | POA: Diagnosis not present

## 2016-11-10 DIAGNOSIS — F332 Major depressive disorder, recurrent severe without psychotic features: Secondary | ICD-10-CM | POA: Diagnosis not present

## 2016-11-10 DIAGNOSIS — F4310 Post-traumatic stress disorder, unspecified: Secondary | ICD-10-CM | POA: Diagnosis not present

## 2016-12-03 DIAGNOSIS — F332 Major depressive disorder, recurrent severe without psychotic features: Secondary | ICD-10-CM | POA: Diagnosis not present

## 2016-12-03 DIAGNOSIS — F4310 Post-traumatic stress disorder, unspecified: Secondary | ICD-10-CM | POA: Diagnosis not present

## 2016-12-10 DIAGNOSIS — F332 Major depressive disorder, recurrent severe without psychotic features: Secondary | ICD-10-CM | POA: Diagnosis not present

## 2016-12-10 DIAGNOSIS — F4310 Post-traumatic stress disorder, unspecified: Secondary | ICD-10-CM | POA: Diagnosis not present

## 2016-12-15 DIAGNOSIS — F4310 Post-traumatic stress disorder, unspecified: Secondary | ICD-10-CM | POA: Diagnosis not present

## 2016-12-15 DIAGNOSIS — F332 Major depressive disorder, recurrent severe without psychotic features: Secondary | ICD-10-CM | POA: Diagnosis not present

## 2016-12-17 DIAGNOSIS — F329 Major depressive disorder, single episode, unspecified: Secondary | ICD-10-CM | POA: Diagnosis not present

## 2016-12-17 DIAGNOSIS — F419 Anxiety disorder, unspecified: Secondary | ICD-10-CM | POA: Diagnosis not present

## 2016-12-17 DIAGNOSIS — K759 Inflammatory liver disease, unspecified: Secondary | ICD-10-CM | POA: Diagnosis not present

## 2016-12-18 DIAGNOSIS — K759 Inflammatory liver disease, unspecified: Secondary | ICD-10-CM | POA: Diagnosis not present

## 2016-12-24 DIAGNOSIS — F332 Major depressive disorder, recurrent severe without psychotic features: Secondary | ICD-10-CM | POA: Diagnosis not present

## 2016-12-24 DIAGNOSIS — F4310 Post-traumatic stress disorder, unspecified: Secondary | ICD-10-CM | POA: Diagnosis not present

## 2016-12-28 DIAGNOSIS — F332 Major depressive disorder, recurrent severe without psychotic features: Secondary | ICD-10-CM | POA: Diagnosis not present

## 2016-12-28 DIAGNOSIS — F4310 Post-traumatic stress disorder, unspecified: Secondary | ICD-10-CM | POA: Diagnosis not present

## 2016-12-29 DIAGNOSIS — E281 Androgen excess: Secondary | ICD-10-CM | POA: Diagnosis not present

## 2017-01-04 DIAGNOSIS — F332 Major depressive disorder, recurrent severe without psychotic features: Secondary | ICD-10-CM | POA: Diagnosis not present

## 2017-01-04 DIAGNOSIS — F4310 Post-traumatic stress disorder, unspecified: Secondary | ICD-10-CM | POA: Diagnosis not present

## 2017-01-13 DIAGNOSIS — F332 Major depressive disorder, recurrent severe without psychotic features: Secondary | ICD-10-CM | POA: Diagnosis not present

## 2017-01-13 DIAGNOSIS — F4310 Post-traumatic stress disorder, unspecified: Secondary | ICD-10-CM | POA: Diagnosis not present

## 2017-01-14 DIAGNOSIS — F332 Major depressive disorder, recurrent severe without psychotic features: Secondary | ICD-10-CM | POA: Diagnosis not present

## 2017-01-14 DIAGNOSIS — F4310 Post-traumatic stress disorder, unspecified: Secondary | ICD-10-CM | POA: Diagnosis not present

## 2017-01-27 DIAGNOSIS — F332 Major depressive disorder, recurrent severe without psychotic features: Secondary | ICD-10-CM | POA: Diagnosis not present

## 2017-01-27 DIAGNOSIS — F4310 Post-traumatic stress disorder, unspecified: Secondary | ICD-10-CM | POA: Diagnosis not present

## 2017-02-22 DIAGNOSIS — F4310 Post-traumatic stress disorder, unspecified: Secondary | ICD-10-CM | POA: Diagnosis not present

## 2017-02-22 DIAGNOSIS — F332 Major depressive disorder, recurrent severe without psychotic features: Secondary | ICD-10-CM | POA: Diagnosis not present

## 2017-04-15 ENCOUNTER — Encounter: Payer: Self-pay | Admitting: Internal Medicine

## 2017-04-15 ENCOUNTER — Ambulatory Visit (INDEPENDENT_AMBULATORY_CARE_PROVIDER_SITE_OTHER): Payer: Medicare Other | Admitting: Internal Medicine

## 2017-04-15 VITALS — BP 110/79 | HR 73 | Temp 97.9°F | Ht 66.0 in | Wt 172.0 lb

## 2017-04-15 DIAGNOSIS — B182 Chronic viral hepatitis C: Secondary | ICD-10-CM | POA: Diagnosis not present

## 2017-04-15 NOTE — Progress Notes (Signed)
[facility]   CC: consideration for treatment for chronic hepatitis C  HPI:  +Randy [name] is a 46 y.o. adult who presents for initial evaluation and management of chronic hepatitis C.  Patient tested positive over 3 years ago. Hepatitis C-associated risk factors present are: IV drug abuse (details: last used over 1 year ago). Patient denies tattoos. Patient has had other studies performed. Results: hepatitis C RNA by PCR, result: positive. Patient has not had prior treatment for Hepatitis C. Patient does not have a past history of liver disease. Patient does not have a family history of liver disease. Patient does not  have associated signs or symptoms related to liver disease.  Labs reviewed and confirm chronic hepatitis C with a positive viral load.   Records reviewed from Epic and she saw me over 3 years ago and started evaluation for hepatitis C treatment but never returned. She now is interested in treatment.      Patient does not have documented immunity to Hepatitis A. Patient does not have documented immunity to Hepatitis B.    Review of Systems:   Constitutional: negative for fatigue and malaise Gastrointestinal: negative for diarrhea Integument/breast: negative for rash All other systems reviewed and are negative       Past Medical History:  Diagnosis Date  . Cellulitis  Of  Left Forearm 01/10/2012   From IVDA  . Depression   . Hepatitis C   . 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 Authorizing Provider  ARIPiprazole (ABILIFY) 5 MG tablet Take 1 tablet (5 mg total) by mouth daily. 09/12/16  Yes Adonis BrookAgustin, Sheila, NP  escitalopram (LEXAPRO) 5 MG tablet Take 1 tablet (5 mg total) by mouth daily. 09/12/16  Yes Adonis BrookAgustin, Sheila, NP  estradiol (ESTRACE) 2 MG tablet Take 1 tablet (2 mg total) by mouth 3 (three) times daily. 09/11/16   Adonis BrookAgustin, Sheila, NP  finasteride  (PROSCAR) 5 MG tablet Take 0.5 tablets (2.5 mg total) by mouth daily. Take 1/2 tablet 09/12/16   Adonis BrookAgustin, Sheila, NP  hydrOXYzine (ATARAX/VISTARIL) 25 MG tablet Take 1 tablet (25 mg total) by mouth every 6 (six) hours as needed for anxiety. 09/11/16   Adonis BrookAgustin, Sheila, NP  traZODone (DESYREL) 50 MG tablet Take 1 tablet (50 mg total) by mouth at bedtime as needed for sleep. 09/11/16   Adonis BrookAgustin, Sheila, NP    Allergies  Allergen Reactions  . Penicillins Other (See Comments)    Swollen joints     Social History   Tobacco Use  . Smoking status: Current Every Day Smoker    Packs/day: 0.50    Years: 20.00    Pack years: 10.00    Types: Cigarettes  . Smokeless tobacco: Never Used  . Tobacco comment: trying to quit-using e-cig some  Substance Use Topics  . Alcohol use: Yes    Alcohol/week: 0.0 oz  . Drug use: Yes    Types: Heroin, "Crack" cocaine    Comment: heroin last used 07/14/14, crack    Family History  Problem Relation Age of Onset  . Idiopathic pulmonary fibrosis Father   no cirrhosis   Objective:  Constitutional: in no apparent distress and alert,  Vitals:   04/15/17 0905  BP: 110/79  Pulse: 73  Temp: 97.9 F (36.6 C)   Eyes: anicteric Cardiovascular: Cor RRR Respiratory: CTA B; normal respiratory effort Gastrointestinal: Bowel sounds are normal, liver is not  enlarged, spleen is not enlarged Musculoskeletal: no pedal edema noted Skin: negatives: no rash; no porphyria cutanea tarda Lymphatic: no cervical lymphadenopathy   Laboratory Genotype:  Lab Results  Component Value Date   HCVGENOTYPE 1a 11/13/2013   HCV viral load:  Lab Results  Component Value Date   HCVQUANT 3,900,304 (H) 11/13/2013   Lab Results  Component Value Date   WBC 6.3 09/07/2016   HGB 16.6 09/07/2016   HCT 47.0 09/07/2016   MCV 90.6 09/07/2016   PLT 131 (L) 09/07/2016    Lab Results  Component Value Date   CREATININE 0.95 09/07/2016   BUN 12 09/07/2016   NA 136 09/07/2016   K 3.6  09/07/2016   CL 105 09/07/2016   CO2 23 09/07/2016    Lab Results  Component Value Date   ALT 69 (H) 09/07/2016   AST 37 09/07/2016   ALKPHOS 47 09/07/2016     Labs and history reviewed and show CHILD-PUGH unknown  5-6 points: Child class A 7-9 points: Child class B 10-15 points: Child class C  Lab Results  Component Value Date   INR 0.91 11/13/2013   BILITOT 0.8 09/07/2016   ALBUMIN 4.5 09/07/2016     Assessment: New Patient with Chronic Hepatitis C genotype 1a, untreated.  I discussed with the patient the lab findings that confirm chronic hepatitis C as well as the natural history and progression of disease including about 30% of people who develop cirrhosis of the liver if left untreated and once cirrhosis is established there is a 2-7% risk per year of liver cancer and liver failure.  I discussed the importance of treatment and benefits in reducing the risk, even if significant liver fibrosis exists.   Plan: 1) Patient counseled extensively on limiting acetaminophen to no more than 2 grams daily, avoidance of alcohol. 2) Transmission discussed with patient including sexual transmission, sharing razors and toothbrush.   3) Will need referral to gastroenterology if concern for cirrhosis 4) Will need referral for substance abuse counseling: No.; Further work up to include urine drug screen  No. 5) Will prescribe Harvoni for 12 weeks 6) Hepatitis A and B vaccine titers 8) Pneumovax vaccine previously given 9) Further work up to include liver staging with elastography 10) will follow up after labs and elastography

## 2017-04-16 LAB — CBC WITH DIFFERENTIAL/PLATELET
BASOS PCT: 0.5 %
Basophils Absolute: 41 cells/uL (ref 0–200)
EOS PCT: 2 %
Eosinophils Absolute: 162 cells/uL (ref 15–500)
HCT: 48.3 % (ref 38.5–50.0)
Hemoglobin: 16.7 g/dL (ref 13.2–17.1)
LYMPHS ABS: 2714 {cells}/uL (ref 850–3900)
MCH: 32.6 pg (ref 27.0–33.0)
MCHC: 34.6 g/dL (ref 32.0–36.0)
MCV: 94.2 fL (ref 80.0–100.0)
MPV: 12.4 fL (ref 7.5–12.5)
Monocytes Relative: 6.2 %
NEUTROS ABS: 4682 {cells}/uL (ref 1500–7800)
NEUTROS PCT: 57.8 %
PLATELETS: 164 10*3/uL (ref 140–400)
RBC: 5.13 10*6/uL (ref 4.20–5.80)
RDW: 12.2 % (ref 11.0–15.0)
Total Lymphocyte: 33.5 %
WBC mixed population: 502 cells/uL (ref 200–950)
WBC: 8.1 10*3/uL (ref 3.8–10.8)

## 2017-04-16 LAB — COMPLETE METABOLIC PANEL WITH GFR
AG Ratio: 1.2 (calc) (ref 1.0–2.5)
ALKALINE PHOSPHATASE (APISO): 54 U/L (ref 40–115)
ALT: 51 U/L — AB (ref 9–46)
AST: 28 U/L (ref 10–40)
Albumin: 3.9 g/dL (ref 3.6–5.1)
BUN: 10 mg/dL (ref 7–25)
CO2: 25 mmol/L (ref 20–32)
Calcium: 9.4 mg/dL (ref 8.6–10.3)
Chloride: 103 mmol/L (ref 98–110)
Creat: 1.12 mg/dL (ref 0.60–1.35)
GFR, Est African American: 91 mL/min/{1.73_m2} (ref >=60)
GFR, Est Non African American: 79 mL/min/{1.73_m2} (ref >=60)
GLUCOSE: 97 mg/dL (ref 65–99)
Globulin: 3.2 g/dL (calc) (ref 1.9–3.7)
Potassium: 4.8 mmol/L (ref 3.5–5.3)
Sodium: 135 mmol/L (ref 135–146)
Total Bilirubin: 0.4 mg/dL (ref 0.2–1.2)
Total Protein: 7.1 g/dL (ref 6.1–8.1)

## 2017-04-16 LAB — HEPATITIS A ANTIBODY, TOTAL: Hepatitis A AB,Total: REACTIVE — AB

## 2017-04-16 LAB — PROTIME-INR
INR: 1
Prothrombin Time: 10.4 s (ref 9.0–11.5)

## 2017-04-16 LAB — HEPATITIS B CORE ANTIBODY, TOTAL: Hep B Core Total Ab: NONREACTIVE

## 2017-04-16 LAB — HEPATITIS B SURFACE ANTIBODY,QUALITATIVE: Hep B S Ab: BORDERLINE — AB

## 2017-04-16 LAB — HEPATITIS B SURFACE ANTIGEN: Hepatitis B Surface Ag: NONREACTIVE

## 2017-04-16 LAB — HIV ANTIBODY (ROUTINE TESTING W REFLEX): HIV: NONREACTIVE

## 2017-04-20 LAB — HEPATITIS C GENOTYPE

## 2017-04-20 LAB — HEPATITIS C RNA QUANTITATIVE
HCV Quantitative Log: 6.89 Log IU/mL — ABNORMAL HIGH
HCV RNA, PCR, QN: 7720000 [IU]/mL — AB

## 2017-04-21 ENCOUNTER — Ambulatory Visit (HOSPITAL_COMMUNITY): Payer: Medicare Other

## 2017-04-21 LAB — LIVER FIBROSIS, FIBROTEST-ACTITEST
ALT: 50 U/L — AB (ref 9–46)
APOLIPOPROTEIN A1: 127 mg/dL (ref 94–176)
Alpha-2-Macroglobulin: 350 mg/dL — ABNORMAL HIGH (ref 106–279)
Bilirubin: 0.3 mg/dL (ref 0.2–1.2)
Fibrosis Score: 0.31
GGT: 15 U/L (ref 3–95)
Haptoglobin: 136 mg/dL (ref 43–212)
Necroinflammat ACT Score: 0.29
REFERENCE ID: 2240376

## 2017-04-26 ENCOUNTER — Ambulatory Visit (HOSPITAL_COMMUNITY)
Admission: RE | Admit: 2017-04-26 | Discharge: 2017-04-26 | Disposition: A | Discharge status: Home / Self Care | Payer: Medicare Other | Source: Ambulatory Visit | Attending: Internal Medicine | Admitting: Internal Medicine

## 2017-04-26 DIAGNOSIS — B182 Chronic viral hepatitis C: Secondary | ICD-10-CM | POA: Insufficient documentation

## 2017-05-06 DIAGNOSIS — F4310 Post-traumatic stress disorder, unspecified: Secondary | ICD-10-CM | POA: Diagnosis not present

## 2017-05-06 DIAGNOSIS — F332 Major depressive disorder, recurrent severe without psychotic features: Secondary | ICD-10-CM | POA: Diagnosis not present

## 2017-05-13 ENCOUNTER — Ambulatory Visit: Payer: Self-pay | Admitting: Internal Medicine

## 2017-06-11 ENCOUNTER — Encounter (HOSPITAL_COMMUNITY): Payer: Self-pay

## 2017-06-11 ENCOUNTER — Inpatient Hospital Stay (HOSPITAL_COMMUNITY)
Admission: RE | Admit: 2017-06-11 | Discharge: 2017-06-16 | DRG: 885 | Disposition: A | Discharge status: Home / Self Care | Payer: Medicare Other | Attending: Psychiatry | Admitting: Psychiatry

## 2017-06-11 ENCOUNTER — Other Ambulatory Visit: Payer: Self-pay

## 2017-06-11 DIAGNOSIS — F419 Anxiety disorder, unspecified: Secondary | ICD-10-CM | POA: Diagnosis present

## 2017-06-11 DIAGNOSIS — F640 Transsexualism: Secondary | ICD-10-CM | POA: Diagnosis not present

## 2017-06-11 DIAGNOSIS — Z62819 Personal history of unspecified abuse in childhood: Secondary | ICD-10-CM | POA: Diagnosis not present

## 2017-06-11 DIAGNOSIS — F649 Gender identity disorder, unspecified: Secondary | ICD-10-CM | POA: Diagnosis present

## 2017-06-11 DIAGNOSIS — F4310 Post-traumatic stress disorder, unspecified: Secondary | ICD-10-CM | POA: Diagnosis present

## 2017-06-11 DIAGNOSIS — F332 Major depressive disorder, recurrent severe without psychotic features: Secondary | ICD-10-CM | POA: Diagnosis present

## 2017-06-11 DIAGNOSIS — Z880 Allergy status to penicillin: Secondary | ICD-10-CM | POA: Diagnosis not present

## 2017-06-11 DIAGNOSIS — F17210 Nicotine dependence, cigarettes, uncomplicated: Secondary | ICD-10-CM | POA: Diagnosis not present

## 2017-06-11 DIAGNOSIS — F410 Panic disorder [episodic paroxysmal anxiety] without agoraphobia: Secondary | ICD-10-CM | POA: Diagnosis not present

## 2017-06-11 DIAGNOSIS — F1990 Other psychoactive substance use, unspecified, uncomplicated: Secondary | ICD-10-CM | POA: Diagnosis not present

## 2017-06-11 DIAGNOSIS — Z79899 Other long term (current) drug therapy: Secondary | ICD-10-CM

## 2017-06-11 DIAGNOSIS — Z736 Limitation of activities due to disability: Secondary | ICD-10-CM | POA: Diagnosis not present

## 2017-06-11 DIAGNOSIS — F322 Major depressive disorder, single episode, severe without psychotic features: Secondary | ICD-10-CM | POA: Diagnosis not present

## 2017-06-11 DIAGNOSIS — Z634 Disappearance and death of family member: Secondary | ICD-10-CM | POA: Diagnosis not present

## 2017-06-11 DIAGNOSIS — F1410 Cocaine abuse, uncomplicated: Secondary | ICD-10-CM | POA: Diagnosis not present

## 2017-06-11 DIAGNOSIS — F1099 Alcohol use, unspecified with unspecified alcohol-induced disorder: Secondary | ICD-10-CM | POA: Diagnosis not present

## 2017-06-11 DIAGNOSIS — F1910 Other psychoactive substance abuse, uncomplicated: Secondary | ICD-10-CM | POA: Diagnosis not present

## 2017-06-11 DIAGNOSIS — R450 Nervousness: Secondary | ICD-10-CM | POA: Diagnosis not present

## 2017-06-11 DIAGNOSIS — F1010 Alcohol abuse, uncomplicated: Secondary | ICD-10-CM | POA: Diagnosis not present

## 2017-06-11 DIAGNOSIS — G4700 Insomnia, unspecified: Secondary | ICD-10-CM | POA: Diagnosis not present

## 2017-06-11 DIAGNOSIS — F1490 Cocaine use, unspecified, uncomplicated: Secondary | ICD-10-CM | POA: Diagnosis not present

## 2017-06-11 DIAGNOSIS — R45851 Suicidal ideations: Secondary | ICD-10-CM | POA: Diagnosis present

## 2017-06-11 MED ORDER — ESCITALOPRAM OXALATE 5 MG PO TABS
5.0000 mg | ORAL_TABLET | Freq: Every day | ORAL | Status: DC
  Administered 2017-06-12: 5 mg via ORAL
  Filled 2017-06-11 (×4): qty 1

## 2017-06-11 MED ORDER — TRAZODONE HCL 50 MG PO TABS
50.0000 mg | ORAL_TABLET | Freq: Every evening | ORAL | Status: DC | PRN
  Filled 2017-06-11: qty 1

## 2017-06-11 MED ORDER — HYDROXYZINE HCL 25 MG PO TABS
25.0000 mg | ORAL_TABLET | Freq: Four times a day (QID) | ORAL | Status: DC | PRN
  Filled 2017-06-11: qty 1

## 2017-06-11 MED ORDER — ESTRADIOL 2 MG PO TABS
2.0000 mg | ORAL_TABLET | Freq: Three times a day (TID) | ORAL | Status: DC
  Administered 2017-06-12 – 2017-06-16 (×14): 2 mg via ORAL
  Filled 2017-06-11 (×17): qty 1

## 2017-06-11 MED ORDER — FINASTERIDE 5 MG PO TABS
2.5000 mg | ORAL_TABLET | Freq: Every day | ORAL | Status: DC
  Administered 2017-06-12 – 2017-06-16 (×5): 2.5 mg via ORAL
  Filled 2017-06-11 (×7): qty 0.5

## 2017-06-11 MED ORDER — NICOTINE 21 MG/24HR TD PT24
21.0000 mg | MEDICATED_PATCH | Freq: Every day | TRANSDERMAL | Status: DC
  Administered 2017-06-12 – 2017-06-16 (×5): 21 mg via TRANSDERMAL
  Filled 2017-06-11 (×7): qty 1

## 2017-06-11 MED ORDER — ARIPIPRAZOLE 5 MG PO TABS
5.0000 mg | ORAL_TABLET | Freq: Every day | ORAL | Status: DC
  Administered 2017-06-12 – 2017-06-16 (×5): 5 mg via ORAL
  Filled 2017-06-11 (×7): qty 1

## 2017-06-11 NOTE — Progress Notes (Signed)
D.  Pt is new admit to unit this afternoon, has remained in bed so far this shift.  Pt did not get up to attend evening AA group.  Minimal interaction.  No distress or discomfort noted, respirations even and unlabored.  A.  Support and encouragement offered, medication given as ordered  R.  Pt remains safe on the unit, will continue to monitor.

## 2017-06-11 NOTE — Tx Team (Signed)
Initial Treatment Plan 06/11/2017 6:37 PM Randy [name] [place] ZOX:[id number]    PATIENT STRESSORS: Loss of life partner two weeks ago. Medication change or noncompliance Substance abuse   PATIENT STRENGTHS: Capable of independent living Wellsite geologistCommunication skills General fund of knowledge   PATIENT IDENTIFIED PROBLEMS: Depression  Suicidal ideation  Relapse on cocaine  "Get to where my mental health isn't feeling progressively worse everyday"  "I want this to be my last hospital stay"             DISCHARGE CRITERIA:  Improved stabilization in mood, thinking, and/or behavior Verbal commitment to aftercare and medication compliance  PRELIMINARY DISCHARGE PLAN: Outpatient therapy Medication management  PATIENT/FAMILY INVOLVEMENT: This treatment plan has been presented to and reviewed with the patient, Randy [name] [place].  The patient and family have been given the opportunity to ask questions and make suggestions.  Levin BaconHeather V Sibyl Mikula, RN 06/11/2017, 6:37 PM
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
## 2017-06-11 NOTE — Progress Notes (Signed)
Randy [name] is a 47 year old male to male transgender.  She [facility] for suicidal ideation, recent relapse on cocaine and increasing depression.  [facility], she continues to report passive SI and will contract for safety on the unit.  She denies any HI or A/V hallucinations.  She reported her recent stressor was losing her life partner two weeks ago by a drug overdose.  Oriented her to the unit.  Admission paperwork completed and signed.  Belongings searched and secured in locker # 26, no contraband found.  Skin assessment completed and no skin issues noted.  Q 15 minute checks initiated for safety.  We will continue to monitor the progress towards her goals.

## 2017-06-11 NOTE — Progress Notes (Signed)
Patient did not attend the evening speaker AA meeting. Pt was notified that group was beginning but remained in bed.   

## 2017-06-11 NOTE — Progress Notes (Signed)
D Patient is seen in her room- flat, depressed, avoiding eye contact. She looks away and down. HEr color is poor. She says little and sits by herslef in the dayrom when she goes there. A She is admitted for feeligns of SI but is willing to contract foe safety. R +Safety is in place.

## 2017-06-11 NOTE — Progress Notes (Signed)
Patient ID: Randy [name], adult   DOB: [birth year], 47 y.o.   MRN: [id number] PER STATE REGULATIONS 482.30  THIS CHART WAS REVIEWED FOR MEDICAL NECESSITY WITH RESPECT TO THE PATIENT'S ADMISSION/DURATION OF STAY.  NEXT REVIEW DATE: 06/15/17  Loura HaltBARBARA Yobani Schertzer, RN, BSN CASE MANAGER

## 2017-06-11 NOTE — H&P (Signed)
Behavioral Health Medical Screening Exam  Randy [name] is an 47 y.o. adult [facility] as [facility] with complaints of suicidal ideation with no specific plan but unable to contract for safety.  Total Time spent with patient: 45 minutes  Psychiatric Specialty Exam: Physical Exam  Vitals reviewed. Constitutional: She is oriented to person, place, and time.  Neck: Normal range of motion. Neck supple.  Respiratory: Effort normal and breath sounds normal.  Musculoskeletal: Normal range of motion.  Neurological: She is alert and oriented to person, place, and time.  Skin: Skin is warm and dry.  Strong body odor     Review of Systems  Psychiatric/Behavioral: Positive for depression, substance abuse and suicidal ideas. The patient is nervous/anxious.   All other systems reviewed and are negative.   Blood pressure 98/65, pulse (!) 103, temperature 98.4 F (36.9 C), temperature source Oral, resp. rate 18, SpO2 100 %.There is no height or weight on file to calculate BMI.  General Appearance: Disheveled and Very odorous  Eye Contact:  Good  Speech:  Clear and Coherent and Normal Rate  Volume:  Normal  Mood:  Depressed  Affect:  Depressed and Flat  Thought Process:  Goal Directed  Orientation:  Full (Time, Place, and Person)  Thought Content:  Denies hallucinationsm, delusions, and paranoia  Suicidal Thoughts:  Yes.  without intent/plan  Homicidal Thoughts:  No  Memory:  Immediate;   Good Recent;   Good Remote;   Good  Judgement:  Fair  Insight:  Lacking  Psychomotor Activity:  Normal  Concentration: Concentration: Good and Attention Span: Fair  Recall:  Fis[employer]Fair  Fund of Knowledge:Fair  Language: Good  Akathisia:  No  Handed:  Right  AIMS (if indicated):     Assets:  Communication Skills Desire for Improvement  Sleep:       Musculoskeletal: Strength & Muscle Tone: within normal limits Gait & Station: normal Patient leans: N/A  Blood pressure 98/65, pulse (!) 103,  temperature 98.4 F (36.9 C), temperature source Oral, resp. rate 18, SpO2 100 %.  Recommendations:Inpatient psychiatric treatment.  [facility]  Based on my evaluation the patient does not appear to have an emergency medical condition.  Shuvon Rankin, NP 06/11/2017, 5:54 PM

## 2017-06-12 DIAGNOSIS — F322 Major depressive disorder, single episode, severe without psychotic features: Secondary | ICD-10-CM

## 2017-06-12 DIAGNOSIS — F4310 Post-traumatic stress disorder, unspecified: Secondary | ICD-10-CM

## 2017-06-12 DIAGNOSIS — F410 Panic disorder [episodic paroxysmal anxiety] without agoraphobia: Secondary | ICD-10-CM

## 2017-06-12 DIAGNOSIS — F1010 Alcohol abuse, uncomplicated: Secondary | ICD-10-CM

## 2017-06-12 DIAGNOSIS — Z634 Disappearance and death of family member: Secondary | ICD-10-CM

## 2017-06-12 DIAGNOSIS — F17210 Nicotine dependence, cigarettes, uncomplicated: Secondary | ICD-10-CM

## 2017-06-12 DIAGNOSIS — G4700 Insomnia, unspecified: Secondary | ICD-10-CM

## 2017-06-12 DIAGNOSIS — F419 Anxiety disorder, unspecified: Secondary | ICD-10-CM

## 2017-06-12 DIAGNOSIS — Z62819 Personal history of unspecified abuse in childhood: Secondary | ICD-10-CM

## 2017-06-12 DIAGNOSIS — F1410 Cocaine abuse, uncomplicated: Secondary | ICD-10-CM

## 2017-06-12 LAB — COMPREHENSIVE METABOLIC PANEL
ALBUMIN: 3.7 g/dL (ref 3.5–5.0)
ALT: 38 U/L (ref 17–63)
AST: 29 U/L (ref 15–41)
Alkaline Phosphatase: 54 U/L (ref 38–126)
Anion gap: 7 (ref 5–15)
BUN: 13 mg/dL (ref 6–20)
CHLORIDE: 107 mmol/L (ref 101–111)
CO2: 25 mmol/L (ref 22–32)
CREATININE: 0.95 mg/dL (ref 0.61–1.24)
Calcium: 9.4 mg/dL (ref 8.9–10.3)
GFR calc Af Amer: >60 mL/min (ref >60)
GLUCOSE: 128 mg/dL — AB (ref 65–99)
Potassium: 4.3 mmol/L (ref 3.5–5.1)
Sodium: 139 mmol/L (ref 135–145)
Total Bilirubin: 0.5 mg/dL (ref 0.3–1.2)
Total Protein: 7.2 g/dL (ref 6.5–8.1)

## 2017-06-12 LAB — LIPID PANEL
CHOL/HDL RATIO: 3.3 ratio
Cholesterol: 153 mg/dL (ref 0–200)
HDL: 46 mg/dL (ref >40)
LDL CALC: 91 mg/dL (ref 0–99)
TRIGLYCERIDES: 78 mg/dL (ref <150)
VLDL: 16 mg/dL (ref 0–40)

## 2017-06-12 LAB — CBC
HCT: 46.2 % (ref 39.0–52.0)
Hemoglobin: 15.6 g/dL (ref 13.0–17.0)
MCH: 32.2 pg (ref 26.0–34.0)
MCHC: 33.8 g/dL (ref 30.0–36.0)
MCV: 95.5 fL (ref 78.0–100.0)
PLATELETS: 142 10*3/uL — AB (ref 150–400)
RBC: 4.84 MIL/uL (ref 4.22–5.81)
RDW: 13.3 % (ref 11.5–15.5)
WBC: 7.1 10*3/uL (ref 4.0–10.5)

## 2017-06-12 LAB — TSH: TSH: 0.702 u[IU]/mL (ref 0.350–4.500)

## 2017-06-12 LAB — HEMOGLOBIN A1C
HEMOGLOBIN A1C: 5.3 % (ref 4.8–5.6)
Mean Plasma Glucose: 105.41 mg/dL

## 2017-06-12 LAB — ETHANOL: Alcohol, Ethyl (B): <10 mg/dL (ref <10)

## 2017-06-12 MED ORDER — ESCITALOPRAM OXALATE 20 MG PO TABS
20.0000 mg | ORAL_TABLET | Freq: Every day | ORAL | Status: DC
  Administered 2017-06-13 – 2017-06-16 (×4): 20 mg via ORAL
  Filled 2017-06-12 (×5): qty 1

## 2017-06-12 MED ORDER — SPIRONOLACTONE 50 MG PO TABS
50.0000 mg | ORAL_TABLET | Freq: Every day | ORAL | Status: DC
  Administered 2017-06-12 – 2017-06-16 (×5): 50 mg via ORAL
  Filled 2017-06-12 (×6): qty 1

## 2017-06-12 MED ORDER — PRAZOSIN HCL 2 MG PO CAPS
2.0000 mg | ORAL_CAPSULE | Freq: Every day | ORAL | Status: DC
  Administered 2017-06-12 – 2017-06-15 (×4): 2 mg via ORAL
  Filled 2017-06-12 (×2): qty 1
  Filled 2017-06-12: qty 2
  Filled 2017-06-12 (×3): qty 1

## 2017-06-12 NOTE — [facility] Group Notes (Signed)
[facility] Group Notes: (Clinical Social Work)   06/12/2017      Type of Therapy:  Group Therapy   Participation Level:  Did Not Attend despite MHT prompting   Ardine Iacovelli Grossman-Orr, LCSW 06/12/2017, 12:16 PM     
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
## 2017-06-12 NOTE — [facility] Suicide Risk Assessment (Signed)
[facility] INPATIENT:  Family/Significant Other Suicide Prevention Education  Suicide Prevention Education:  Patient Refusal for Family/Significant Other Suicide Prevention Education: The patient Randy [name] has refused to provide written consent for family/significant other to be provided Family/Significant Other Suicide Prevention Education during admission and/or prior to discharge.  Physician notified.  Carloyn JaegerMareida J Grossman-Orr 06/12/2017, 2:45 PM

## 2017-06-12 NOTE — [facility] Group Notes (Signed)
[facility] Group Notes:  (Nursing)  Date:  06/12/2017  Time:  1:15 P Type of Therapy:  Nurse Education  Participation Level:  Did Not Attend  Participation Quality:  did not attend  Affect:  did not attend  Cognitive:  did not attend  Insight:  None  Engagement in Group:  None  Modes of Intervention:  Discussion and Education  Summary of Progress/Problems:  Shela NevinValerie S Bevelyn Arriola 06/12/2017, 5:40 PM

## 2017-06-12 NOTE — Progress Notes (Signed)
D. Pt presents with flat affect and depressed behavior- Pt in bed upon initial approach. Pt completed self inventory, rating her depression, hopelessness and anxiety a 8/7/6, respectively. Pt currently denies SI/HI and AVH .  A. Labs and vitals monitored. Pt compliant with medications. Pt supported emotionally and encouraged to express concerns and ask questions.   R. Pt remains safe with 15 minute checks. Will continue POC.

## 2017-06-12 NOTE — Progress Notes (Signed)
D.  Pt in room on approach, no complaints voiced at this time.  Pt did not get up for evening AA group, has remained in room this shift.  Pt requested minipress to be ordered as he takes it for PTSD at home.  Pt denies SI/HI/AVH at this time.  A.  Spoke with NP and received order for Pt's minipress.  Support and encouragement offered, medication given as ordered.  R.  Pt remains safe on the unit, will continue to monitor.
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
## 2017-06-13 DIAGNOSIS — Z736 Limitation of activities due to disability: Secondary | ICD-10-CM

## 2017-06-13 DIAGNOSIS — R450 Nervousness: Secondary | ICD-10-CM

## 2017-06-13 LAB — URINALYSIS, COMPLETE (UACMP) WITH MICROSCOPIC
Bilirubin Urine: NEGATIVE
GLUCOSE, UA: NEGATIVE mg/dL
HGB URINE DIPSTICK: NEGATIVE
Ketones, ur: NEGATIVE mg/dL
Nitrite: NEGATIVE
PROTEIN: NEGATIVE mg/dL
Specific Gravity, Urine: 1.013 (ref 1.005–1.030)
pH: 7 (ref 5.0–8.0)

## 2017-06-13 LAB — RAPID URINE DRUG SCREEN, HOSP PERFORMED
AMPHETAMINES: NOT DETECTED
Barbiturates: NOT DETECTED
Benzodiazepines: NOT DETECTED
Cocaine: POSITIVE — AB
Opiates: NOT DETECTED
Tetrahydrocannabinol: NOT DETECTED

## 2017-06-13 NOTE — [facility] Group Notes (Signed)
Pt was invited but did not attend orientation/goals group. 

## 2017-06-13 NOTE — Progress Notes (Signed)
Pt observed in milieu throughout the day- and interacting with peers. Pt actively engaged in relaxation group this afternoon.

## 2017-06-13 NOTE — Progress Notes (Signed)
D. Pt presents with a sad affect, but appears to be feeling better. Pt observed in milieu throughout the day and has been actively engaged in afternoon groups. . Pt currently denies SI/HI and AVH  A. Labs and vitals monitored. Pt compliant with medications. Pt supported emotionally and encouraged to express concerns and ask questions.   R. Pt remains safe with 15 minute checks. Will continue POC.

## 2017-06-13 NOTE — Progress Notes (Signed)
D: Pt was in bed in her room upon initial approach.  Pt presents with depressed affect and mood.  She denies having a goal today.  Writer and pt made a goal tonight to be safe.  Pt denies SI/HI, denies hallucinations, denies pain.  Pt interacts with others cautiously.    A: Introduced self to pt.  Actively listened to pt and offered support and encouragement.  PO fluids encouraged and reinforced.  Medication administered per order.  Fall prevention techniques reviewed with pt and pt verbalized understanding.  R: Pt is safe on the unit.  Pt is compliant with medication.  Pt verbally contracts for safety.  Will continue to monitor and assess.

## 2017-06-13 NOTE — [facility] Group Notes (Signed)
[facility] Group Notes:  (Nursing)  Date:  06/13/2017  Time:  2:52 PM  Type of Therapy:  Nurse Education  Participation Level:  Minimal  Participation Quality:  Appropriate  Affect:  Depressed  Cognitive:  Appropriate  Insight:  Good  Engagement in Group:  Improving  Modes of Intervention:  Discussion and Education  Summary of Progress/Problems:  Patient was quiet for most of the time, but opened up toward the end of the group  Shela NevinValerie S Hermes Wafer 06/13/2017, 2:52 PM

## 2017-06-13 NOTE — [facility] Group Notes (Signed)
[facility] Group Notes: (Clinical Social Work)   06/13/2017      Type of Therapy:  Group Therapy   Participation Level:  Did Not Attend despite MHT prompting   Juliyah Mergen Grossman-Orr, LCSW 06/13/2017, 12:08 PM     

## 2017-06-13 NOTE — Progress Notes (Signed)
Patient did not attend the evening speaker AA meeting. Pt was notified that group was beginning but remained in bed.   

## 2017-06-13 NOTE — Plan of Care (Signed)
  Progressing Safety: Periods of time without injury will increase 06/13/2017 2247 - Progressing by Arrie Aranhurch, Sonnie Pawloski J, RN Note Pt has not harmed self or others tonight.  She denies SI/HI and verbally contracts for safety.

## 2017-06-13 NOTE — Progress Notes (Signed)
[name] Progress Note  06/13/2017 2:21 PM Randy [name]  MRN:  [id number]   Subjective:  Aul reports " I am feeling a little bit better today, I know tomorrow I have to call my landlord to make sure I have a place to go back to,worrying about a place to live creates a lot of my stress."   Objective: Randy [name] is awake, alert and oriented. Seen attending group session reports this is the first group session that I have attended since my admission.  Denies suicidal or homicidal ideation during this assessment. Denies auditory or visual hallucination and does not appear to be responding to internal stimuli. Reports mild depression that appears to be getting better and better by the day.  [name] reports medication compliant and denies mediation side effects.  Reports depression 5/10 however over all mood is improving. Reports good appetite other wise and resting well. Support, encouragement and reassurance was provided.    Principal Problem: MDD (major depressive disorder), single episode, severe , no psychosis ([place]) Diagnosis:   Patient Active Problem List   Diagnosis Date Noted  . MDD (major depressive disorder), single episode, severe , no psychosis ([place]) [F32.2] 06/11/2017  . Hepatitis C [B19.20] 03/14/2015  . Cough [R05] 03/14/2015  . Major depressive disorder, recurrent, severe without psychotic features ([place]) [F33.2]   . Opioid dependence with opioid-induced mood disorder ([place]) [F11.24]   . Cocaine dependence with cocaine-induced mood disorder ([place]) [F14.24]   . MDD (major depressive disorder) [F32.9] 09/17/2014  . MDD (major depressive disorder), recurrent severe, without psychosis ([place]) [F33.2] 07/05/2014  . Major depression, recurrent ([place]) [F33.9] 04/10/2012  . Cocaine dependence ([place]) [F14.20] 04/10/2012  . Polysubstance abuse including IVDA (heroin), cocaine, marijuana [F19.10] 01/10/2012  . Elevated LFTs [R94.5] 01/10/2012  . Chronic hepatitis C without hepatic coma ([place]) [B18.2]  01/10/2012  . 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 Tobacco abuse [Z72.0] 01/10/2012  . Anxiety disorder [F41.9] 08/14/2011  . Recurrent major depression-severe ([place]) [F33.2] 08/13/2011   Total Time spent with patient: 15 minutes  Past Psychiatric History:   Past Medical History:  Past Medical History:  Diagnosis Date  . Cellulitis  Of  Left Forearm 01/10/2012   From IVDA  . Depression   . Hepatitis C   . 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 I&D EXTREMITY  01/13/2012   Procedure: IRRIGATION AND DEBRIDEMENT EXTREMITY;  Surgeon: Sharmon Revere, MD;  Location: [facility];  Service: Orthopedics;  Laterality: Left;   Family History:  Family History  Problem Relation Age of Onset  . Idiopathic pulmonary fibrosis Father    Family Psychiatric  History:  Social History:  Social History   Substance and Sexual Activity  Alcohol Use Yes  . Alcohol/week: 0.0 oz     Social History   Substance and Sexual Activity  Drug Use Yes  . Types: Heroin, "Crack" cocaine   Comment: heroin last used 07/14/14, crack    Social History   Socioeconomic History  . Marital status: Single    Spouse name: None  . Number of children: None  . Years of education: None  . Highest education level: None  Social Needs  . Financial resource strain: None  . Food insecurity - worry: None  . Food insecurity - inability: None  . Transportation needs - medical: None  . Transportation needs - non-medical: None  Occupational History  . 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 Smoking status: Current Every Day Smoker    Packs/day: 0.50    Years: 20.00    Pack years: 10.00    Types: Cigarettes  . Smokeless tobacco: Never Used  . Tobacco comment: trying to quit-using e-cig some  Substance and Sexual Activity  . Alcohol use: Yes    Alcohol/week: 0.0 oz  . Drug use: Yes    Types:  Heroin, "Crack" cocaine    Comment: heroin last used 07/14/14, crack  . Sexual activity: Yes    Partners: Male    Birth control/protection: Condom    Comment: Has been HIV tested in past and has been negative.  Other Topics Concern  . None  Social History Narrative   Transgendered male.  Single.  Lives alone.  Independent.   Additional Social History:    Pain Medications: See MAR Prescriptions: See MAR Over the Counter: See MAR History of alcohol / drug use?: Yes Longest period of sobriety (when/how long): 2 years Negative Consequences of Use: Financial, Personal relationships Name of Substance 1: Cocaine 1 - Last Use / Amount: 06/09/17                  Sleep: Fair  Appetite:  Fair  Current Medications: Current Facility-Administered Medications  Medication Dose Route Frequency Provider Last Rate Last Dose  . ARIPiprazole (ABILIFY) tablet 5 mg  5 mg Oral Daily Rankin, Shuvon B, NP   5 mg at 06/13/17 0743  . escitalopram (LEXAPRO) tablet 20 mg  20 mg Oral Daily Izediuno, Laruth Bouchard, MD   20 mg at 06/13/17 0743  . estradiol (ESTRACE) tablet 2 mg  2 mg Oral TID Rankin, Shuvon B, NP   2 mg at 06/13/17 1159  . finasteride (PROSCAR) tablet 2.5 mg  2.5 mg Oral Daily Rankin, Shuvon B, NP   2.5 mg at 06/13/17 0744  . hydrOXYzine (ATARAX/VISTARIL) tablet 25 mg  25 mg Oral Q6H PRN Rankin, Shuvon B, NP      . nicotine (NICODERM CQ - dosed in mg/24 hours) patch 21 mg  21 mg Transdermal Daily Izediuno, Laruth Bouchard, MD   21 mg at 06/13/17 0744  . prazosin (MINIPRESS) capsule 2 mg  2 mg Oral QHS Lindon Romp A, NP   2 mg at 06/12/17 2129  . spironolactone (ALDACTONE) tablet 50 mg  50 mg Oral Daily Izediuno, Laruth Bouchard, MD   50 mg at 06/13/17 0742  . traZODone (DESYREL) tablet 50 mg  50 mg Oral QHS PRN Rankin, Shuvon B, NP        Lab Results:  Results for orders placed or performed during the hospital encounter of 06/11/17 (from the past 48 hour(s))  CBC     Status: Abnormal   Collection  Time: 06/12/17  8:13 AM  Result Value Ref Range   WBC 7.1 4.0 - 10.5 K/uL   RBC 4.84 4.22 - 5.81 MIL/uL   Hemoglobin 15.6 13.0 - 17.0 g/dL   HCT 46.2 39.0 - 52.0 %   MCV 95.5 78.0 - 100.0 fL   MCH 32.2 26.0 - 34.0 pg   MCHC 33.8 30.0 - 36.0 g/dL   RDW 13.3 11.5 - 15.5 %   Platelets 142 (L) 150 - 400 K/uL    Comment: Performed at [facility], [place] [address], [place], [place] [address]  Comprehensive metabolic panel     Status: Abnormal   Collection Time: 06/12/17  8:13 AM  Result Value Ref Range  Sodium 139 135 - 145 mmol/L   Potassium 4.3 3.5 - 5.1 mmol/L   Chloride 107 101 - 111 mmol/L   CO2 25 22 - 32 mmol/L   Glucose, Bld 128 (H) 65 - 99 mg/dL   BUN 13 6 - 20 mg/dL   Creatinine, Ser 0.95 0.61 - 1.24 mg/dL   Calcium 9.4 8.9 - 10.3 mg/dL   Total Protein 7.2 6.5 - 8.1 g/dL   Albumin 3.7 3.5 - 5.0 g/dL   AST 29 15 - 41 U/L   ALT 38 17 - 63 U/L   Alkaline Phosphatase 54 38 - 126 U/L   Total Bilirubin 0.5 0.3 - 1.2 mg/dL   GFR calc non Af Amer >60 >60 mL/min   GFR calc Af Amer >60 >60 mL/min    Comment: (NOTE) The eGFR has been calculated using the CKD EPI equation. This calculation has not been validated in all clinical situations. eGFR's persistently <60 mL/min signify possible Chronic Kidney Disease.    Anion gap 7 5 - 15    Comment: Performed at [facility], [place] [address] [place], [place] [address]  Hemoglobin A1c     Status: None   Collection Time: 06/12/17  8:13 AM  Result Value Ref Range   Hgb A1c MFr Bld 5.3 4.8 - 5.6 %    Comment: (NOTE) Pre diabetes:          5.7%-6.4% Diabetes:              >6.4% Glycemic control for   <7.0% adults with diabetes    Mean Plasma Glucose 105.41 mg/dL    Comment: Performed at [facility] [address] [place], [place] [address]  Ethanol     Status: None   Collection Time: 06/12/17  8:13 AM  Result Value Ref Range   Alcohol, Ethyl (B) <10 <10 mg/dL    Comment:         LOWEST DETECTABLE LIMIT FOR SERUM ALCOHOL IS 10 mg/dL FOR MEDICAL PURPOSES ONLY Performed at [facility] [address] [place], [place] [address]   Lipid panel     Status: None   Collection Time: 06/12/17  8:13 AM  Result Value Ref Range   Cholesterol 153 0 - 200 mg/dL   Triglycerides 78 <150 mg/dL   HDL 46 >40 mg/dL   Total CHOL/HDL Ratio 3.3 RATIO   VLDL 16 0 - 40 mg/dL   LDL Cholesterol 91 0 - 99 mg/dL    Comment:        Total Cholesterol/HDL:CHD Risk Coronary Heart Disease Risk Table                     Men   Women  1/2 Average Risk   3.4   3.3  Average Risk       5.0   4.4  2 X Average Risk   9.6   7.1  3 X Average Risk  23.4   11.0        Use the calculated Patient Ratio above and the CHD Risk Table to determine the patient's CHD Risk.        ATP III CLASSIFICATION (LDL):  <100     mg/dL   Optimal  100-129  mg/dL   Near or Above                    Optimal  130-159  mg/dL   Borderline  160-189  mg/dL  High  >190     mg/dL   Very High Performed at [facility] [address], [place], [place] [address]   TSH     Status: None   Collection Time: 06/12/17  8:13 AM  Result Value Ref Range   TSH 0.702 0.350 - 4.500 uIU/mL    Comment: Performed by a 3rd Generation assay with a functional sensitivity of <=0.01 uIU/mL. Performed at [facility], [place] [address], [place], [place] [address]   Urinalysis, Complete w Microscopic     Status: Abnormal   Collection Time: 06/12/17  8:20 AM  Result Value Ref Range   Color, Urine YELLOW YELLOW   APPearance CLEAR CLEAR   Specific Gravity, Urine 1.013 1.005 - 1.030   pH 7.0 5.0 - 8.0   Glucose, UA NEGATIVE NEGATIVE mg/dL   Hgb urine dipstick NEGATIVE NEGATIVE   Bilirubin Urine NEGATIVE NEGATIVE   Ketones, ur NEGATIVE NEGATIVE mg/dL   Protein, ur NEGATIVE NEGATIVE mg/dL   Nitrite NEGATIVE NEGATIVE   Leukocytes, UA MODERATE (A) NEGATIVE   RBC / HPF 0-5 0 - 5  RBC/hpf   WBC, UA 6-30 0 - 5 WBC/hpf   Bacteria, UA RARE (A) NONE SEEN   Squamous Epithelial / LPF 0-5 (A) NONE SEEN    Comment: Performed at [facility], [place] [address] [place], [place] [address]  Urine rapid drug screen (hosp performed)not at [facility]     Status: Abnormal   Collection Time: 06/12/17  8:20 AM  Result Value Ref Range   Opiates NONE DETECTED NONE DETECTED   Cocaine POSITIVE (A) NONE DETECTED   Benzodiazepines NONE DETECTED NONE DETECTED   Amphetamines NONE DETECTED NONE DETECTED   Tetrahydrocannabinol NONE DETECTED NONE DETECTED   Barbiturates NONE DETECTED NONE DETECTED    Comment: (NOTE) DRUG SCREEN FOR MEDICAL PURPOSES ONLY.  IF CONFIRMATION IS NEEDED FOR ANY PURPOSE, NOTIFY LAB WITHIN 5 DAYS. LOWEST DETECTABLE LIMITS FOR URINE DRUG SCREEN Drug Class                     Cutoff (ng/mL) Amphetamine and metabolites    1000 Barbiturate and metabolites    200 Benzodiazepine                 631 Tricyclics and metabolites     300 Opiates and metabolites        300 Cocaine and metabolites        300 THC                            50 Performed at [facility], [place] [address] [place], [place] [address]     Blood Alcohol level:  Lab Results  Component Value Date   ETH <10 06/12/2017   ETH <5 63/78/5885    Metabolic Disorder Labs: Lab Results  Component Value Date   HGBA1C 5.3 06/12/2017   MPG 105.41 06/12/2017   MPG 103 09/09/2016   Lab Results  Component Value Date   PROLACTIN 6.3 09/09/2016   Lab Results  Component Value Date   CHOL 153 06/12/2017   TRIG 78 06/12/2017   HDL 46 06/12/2017   CHOLHDL 3.3 06/12/2017   VLDL 16 06/12/2017   LDLCALC 91 06/12/2017   LDLCALC 78 09/09/2016    Physical Findings: AIMS: Facial and Oral Movements Muscles of Facial Expression: None, normal Lips and Perioral Area: None, normal Jaw: None, normal Tongue:  None, normal,Extremity Movements Upper (arms, wrists, hands,  fingers): None, normal Lower (legs, knees, ankles, toes): None, normal, Trunk Movements Neck, shoulders, hips: None, normal, Overall Severity Severity of abnormal movements (highest score from questions above): None, normal Incapacitation due to abnormal movements: None, normal Patient's awareness of abnormal movements (rate only patient's report): No Awareness, Dental Status Current problems with teeth and/or dentures?: No Does patient usually wear dentures?: No  CIWA:    COWS:     Musculoskeletal: Strength & Muscle Tone: within normal limits Gait & Station: normal Patient leans: N/A  Psychiatric Specialty Exam: Physical Exam  Constitutional: She is oriented to person, place, and time. She appears well-developed and well-nourished.  Neurological: She is alert and oriented to person, place, and time.  Psychiatric: She has a normal mood and affect.    Review of Systems  Psychiatric/Behavioral: Positive for depression. The patient is nervous/anxious and has insomnia.     Blood pressure 96/61, pulse 90, temperature 97.9 F (36.6 C), temperature source Oral, resp. rate 16, height '5\' 6"'  (1.676 m), weight 74.8 kg (165 lb), SpO2 100 %.Body mass index is 26.63 kg/m.  General Appearance: Casual  Eye Contact:  Good  Speech:  Clear and Coherent  Volume:  Normal  Mood:  Anxious and Depressed  Affect:  Congruent  Thought Process:  Coherent  Orientation:  Full (Time, Place, and Person)  Thought Content:  Hallucinations: None  Suicidal Thoughts:  No  Homicidal Thoughts:  No  Memory:  Immediate;   Fair Recent;   Fair Remote;   Fair  Judgement:  Fair  Insight:  Fair  Psychomotor Activity:  Normal  Concentration:  Concentration: Fair  Recall:  [employer] of Knowledge:  Fair  Language:  Fair  Akathisia:  No  Handed:  Right  AIMS (if indicated):     Assets:  Communication Skills Desire for Improvement Resilience Social Support  ADL's:  Intact  Cognition:  WNL  Sleep:  Number of  Hours: 5.75     Treatment Plan Summary: Daily contact with patient to assess and evaluate symptoms and progress in treatment and Medication management   Continue with current treatment plan on 06/13/2017 except where noted  Continue with Abilify 5 mg and Lexapro 5 mg  for mood stabilization. Continue with Trazodone 50 mg for insomnia  Will continue to monitor vitals ,medication compliance and treatment side effects while patient is here.  Reviewed labs Glucose 128 elevated, TSH wnl BAL - , UDS + cocaine CSW will start working on disposition.  Patient to participate in therapeutic milieu     Derrill [place], NP 06/13/2017, 2:21 PM

## 2017-06-14 NOTE — Progress Notes (Signed)
Patient ID: Randy [name], adult   DOB: 02/18/1971, 47 y.o.   MRN: [id number]  DAR: Pt. Denies SI/HI and A/V Hallucinations. She reports that her sleep last night was fair, her appetite is fair, her energy level is low, and her concentration is poor. She rates her depression level 8/10, her hopelessness level 6/10, and her anxiety level 5/10. She denies any withdrawal symptoms at present. She reports that she is feeling some "fatigue." Support and encouragement provided to the patient however patient remains isolative to her room throughout most of the day. She is seen minimally in the milieu however appears to be keeping to herself, not speaking with peers. She presents with flat affect and depressed mood. Scheduled medications administered to patient per physician's orders. Q15 minute checks are maintained for safety.

## 2017-06-14 NOTE — Tx Team (Signed)
Interdisciplinary Treatment and Diagnostic Plan Update  06/14/2017 Time of Session: 1610RU Randy [name] MRN: [id number]  Principal Diagnosis: MDD (major depressive disorder), single episode, severe , no psychosis (HCC)  Secondary Diagnoses: Principal Problem:   MDD (major depressive disorder), single episode, severe , no psychosis (HCC)   Current Medications:  Current Facility-Administered Medications  Medication Dose Route Frequency Provider Last Rate Last Dose  . ARIPiprazole (ABILIFY) tablet 5 mg  5 mg Oral Daily Rankin, Shuvon B, NP   5 mg at 06/14/17 0832  . escitalopram (LEXAPRO) tablet 20 mg  20 mg Oral Daily Izediuno, Vincent A, MD   20 mg at 06/14/17 0830  . estradiol (ESTRACE) tablet 2 mg  2 mg Oral TID Rankin, Shuvon B, NP   2 mg at 06/14/17 0831  . finasteride (PROSCAR) tablet 2.5 mg  2.5 mg Oral Daily Rankin, Shuvon B, NP   2.5 mg at 06/14/17 0831  . hydrOXYzine (ATARAX/VISTARIL) tablet 25 mg  25 mg Oral Q6H PRN Rankin, Shuvon B, NP      . nicotine (NICODERM CQ - dosed in mg/24 hours) patch 21 mg  21 mg Transdermal Daily Izediuno, Delight Ovens, MD   21 mg at 06/14/17 0831  . prazosin (MINIPRESS) capsule 2 mg  2 mg Oral QHS Nira Conn A, NP   2 mg at 06/13/17 2158  . spironolactone (ALDACTONE) tablet 50 mg  50 mg Oral Daily Izediuno, Delight Ovens, MD   50 mg at 06/14/17 0830  . traZODone (DESYREL) tablet 50 mg  50 mg Oral QHS PRN Rankin, Shuvon B, NP       PTA Medications: Medications Prior to Admission  Medication Sig Dispense Refill Last Dose  . ARIPiprazole (ABILIFY) 5 MG tablet Take 1 tablet (5 mg total) by mouth daily. 30 tablet 0 Past Week at Unknown time  . escitalopram (LEXAPRO) 5 MG tablet Take 1 tablet (5 mg total) by mouth daily. 30 tablet 0 Past Week at Unknown time  . estradiol (ESTRACE) 2 MG tablet Take 1 tablet (2 mg total) by mouth 3 (three) times daily. 90 tablet 0 Past Week at Unknown time  . finasteride (PROSCAR) 5 MG tablet Take 0.5 tablets (2.5 mg total) by  mouth daily. Take 1/2 tablet 15 tablet 0 Past Week at Unknown time  . prazosin (MINIPRESS) 2 MG capsule Take 2 mg by mouth at bedtime.   Past Week at Unknown time  . spironolactone (ALDACTONE) 50 MG tablet Take 50 mg by mouth daily.   Past Week at Unknown time    Patient Stressors: Loss of life partner two weeks ago. Medication change or noncompliance Substance abuse  Patient Strengths: Capable of independent living Wellsite geologist fund of knowledge  Treatment Modalities: Medication Management, Group therapy, Case management,  1 to 1 session with clinician, Psychoeducation, Recreational therapy.   Physician Treatment Plan for Primary Diagnosis: MDD (major depressive disorder), single episode, severe , no psychosis (HCC) Long Term Goal(s): Improvement in symptoms so as ready for discharge Improvement in symptoms so as ready for discharge   Short Term Goals: Ability to verbalize feelings will improve Ability to disclose and discuss suicidal ideas Ability to demonstrate self-control will improve Ability to identify and develop effective coping behaviors will improve Ability to maintain clinical measurements within normal limits will improve Compliance with prescribed medications will improve Ability to verbalize feelings will improve Ability to disclose and discuss suicidal ideas Ability to demonstrate self-control will improve Ability to identify and develop effective coping behaviors will  improve Ability to maintain clinical measurements within normal limits will improve  Medication Management: Evaluate patient's response, side effects, and tolerance of medication regimen.  Therapeutic Interventions: 1 to 1 sessions, Unit Group sessions and Medication administration.  Evaluation of Outcomes: Progressing  Physician Treatment Plan for Secondary Diagnosis: Principal Problem:   MDD (major depressive disorder), single episode, severe , no psychosis (HCC)  Long Term  Goal(s): Improvement in symptoms so as ready for discharge Improvement in symptoms so as ready for discharge   Short Term Goals: Ability to verbalize feelings will improve Ability to disclose and discuss suicidal ideas Ability to demonstrate self-control will improve Ability to identify and develop effective coping behaviors will improve Ability to maintain clinical measurements within normal limits will improve Compliance with prescribed medications will improve Ability to verbalize feelings will improve Ability to disclose and discuss suicidal ideas Ability to demonstrate self-control will improve Ability to identify and develop effective coping behaviors will improve Ability to maintain clinical measurements within normal limits will improve     Medication Management: Evaluate patient's response, side effects, and tolerance of medication regimen.  Therapeutic Interventions: 1 to 1 sessions, Unit Group sessions and Medication administration.  Evaluation of Outcomes: Progressing   RN Treatment Plan for Primary Diagnosis: MDD (major depressive disorder), single episode, severe , no psychosis (HCC) Long Term Goal(s): Knowledge of disease and therapeutic regimen to maintain health will improve  Short Term Goals: Ability to remain free from injury will improve, Ability to disclose and discuss suicidal ideas and Ability to identify and develop effective coping behaviors will improve  Medication Management: RN will administer medications as ordered by provider, will assess and evaluate patient's response and provide education to patient for prescribed medication. RN will report any adverse and/or side effects to prescribing provider.  Therapeutic Interventions: 1 on 1 counseling sessions, Psychoeducation, Medication administration, Evaluate responses to treatment, Monitor vital signs and CBGs as ordered, Perform/monitor CIWA, COWS, AIMS and Fall Risk screenings as ordered, Perform wound care  treatments as ordered.  Evaluation of Outcomes: Progressing   LCSW Treatment Plan for Primary Diagnosis: MDD (major depressive disorder), single episode, severe , no psychosis (HCC) Long Term Goal(s): Safe transition to appropriate next level of care at discharge, Engage patient in therapeutic group addressing interpersonal concerns.  Short Term Goals: Engage patient in aftercare planning with referrals and resources, Facilitate patient progression through stages of change regarding substance use diagnoses and concerns and Increase skills for wellness and recovery  Therapeutic Interventions: Assess for all discharge needs, 1 to 1 time with Social worker, Explore available resources and support systems, Assess for adequacy in community support network, Educate family and significant other(s) on suicide prevention, Complete Psychosocial Assessment, Interpersonal group therapy.  Evaluation of Outcomes: Progressing   Progress in Treatment: Attending groups: Yes. Participating in groups: Yes. Taking medication as prescribed: Yes. Toleration medication: Yes. Family/Significant other contact made: SPE completed with pt; pt declined to consent to family contact.  Patient understands diagnosis: Yes. Discussing patient identified problems/goals with staff: Yes. Medical problems stabilized or resolved: Yes. Denies suicidal/homicidal ideation: Yes. Issues/concerns per patient self-inventory: No. Other: n/a   New problem(s) identified: No, Describe:  n/a  New Short Term/Long Term Goal(s): detox, medication management for mood stabilization; elimination of SI thoughts; development of comprehensive mental wellness/sobriety plan.   Patient Goal: "to get help for my suicidal thoughts and depression; to get my medications changed and get resources for grief counseling."   Discharge Plan or Barriers: CSW assessing. Pt plans  to return to the [facility] for medication management for therapy and  counseling. MHAG pamphlet and AA/NA lists provided for additional community supports.   Reason for Continuation of Hospitalization: Anxiety Depression Medication stabilization Suicidal ideation Withdrawal symptoms  Estimated Length of Stay: Wed, 06/16/17  Attendees: Patient: 06/14/2017 8:44 AM  Physician: Dr. Altamese Independence MD; Dr. Jackquline Berlin MD 06/14/2017 8:44 AM  Nursing: Foy Guadalajara RN; Erskine Squibb RN 06/14/2017 8:44 AM  RN Care Manager: Onnie Boer CM 06/14/2017 8:44 AM  Social Worker: Trula Slade, LCSW 06/14/2017 8:44 AM  Recreational Therapist: x 06/14/2017 8:44 AM  Other: Hillery Jacks NP; Feliz Beam Money NP 06/14/2017 8:44 AM  Other:  06/14/2017 8:44 AM  Other: 06/14/2017 8:44 AM    Scribe for Treatment Team: Ledell Peoples Smart, LCSW 06/14/2017 8:44 AM

## 2017-06-14 NOTE — Progress Notes (Signed)
Recreation Therapy Notes  Date: 06/14/17 Time: 0930 Location: 300 [address] Dayroom  Group Topic: Stress Management  Goal Area(s) Addresses:  Patient will verbalize importance of using healthy stress management.  Patient will identify positive emotions associated with healthy stress management.   Intervention: Stress Management  Activity :  Gratitude Meditation.  LRT played meditation from Calm app to allow patients to meditate on being grateful for the things, skills or people in their lives that have had a positive effect on their lives.  Education: Stress Management, Discharge Planning.   Education Outcome: Acknowledges edcuation/In group clarification offered/Needs additional education  Clinical Observations/Feedback: Pt did not attend group.    Kieara Schwark Linday, LRT/CTRS         Caroll RancherLindsay, Hailly Fess A 06/14/2017 12:26 PM

## 2017-06-14 NOTE — Progress Notes (Signed)
[facility] MD Progress Note  06/14/2017 2:38 PM Randy [name]  MRN:  [id number]   Objective: Randy [name] is awake, alert and oriented. Patient was evaluated by MD and NP. Present slight dishevel but pleasant.  Patient continues to ruminate about the passing of a close friend. Reports plans to follow-up with social worker and landlord for housing recourse.  Denies suicidal or homicidal ideation during this assessment during this assessment. Denies auditory or visual hallucination and does not appear to be responding to internal stimuli. Randy [name] reports medication compliances is currently denying  mediation side effects. Reports good appetite other wise and resting well. Support, encouragement and reassurance was provided.    Principal Problem: MDD (major depressive disorder), single episode, severe , no psychosis (HCC) Diagnosis:   Patient Active Problem List   Diagnosis Date Noted  . MDD (major depressive disorder), single episode, severe , no psychosis (HCC) [F32.2] 06/11/2017  . Hepatitis C [B19.20] 03/14/2015  . Cough [R05] 03/14/2015  . Major depressive disorder, recurrent, severe without psychotic features (HCC) [F33.2]   . Opioid dependence with opioid-induced mood disorder (HCC) [F11.24]   . Cocaine dependence with cocaine-induced mood disorder (HCC) [F14.24]   . MDD (major depressive disorder) [F32.9] 09/17/2014  . MDD (major depressive disorder), recurrent severe, without psychosis (HCC) [F33.2] 07/05/2014  . Major depression, recurrent (HCC) [F33.9] 04/10/2012  . Cocaine dependence (HCC) [F14.20] 04/10/2012  . Polysubstance abuse including IVDA (heroin), cocaine, marijuana [F19.10] 01/10/2012  . Elevated LFTs [R94.5] 01/10/2012  . Chronic hepatitis C without hepatic coma (HCC) [B18.2] 01/10/2012  . 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 Tobacco abuse [Z72.0] 01/10/2012  . Anxiety disorder [F41.9] 08/14/2011  . Recurrent major depression-severe (HCC) [F33.2]  08/13/2011   Total Time spent with patient: 15 minutes  Past Psychiatric History:   Past Medical History:  Past Medical History:  Diagnosis Date  . Cellulitis  Of  Left Forearm 01/10/2012   From IVDA  . Depression   . Hepatitis C   . 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 I&D EXTREMITY  01/13/2012   Procedure: IRRIGATION AND DEBRIDEMENT EXTREMITY;  Surgeon: Kennieth Rad, MD;  Location: [facility];  Service: Orthopedics;  Laterality: Left;   Family History:  Family History  Problem Relation Age of Onset  . Idiopathic pulmonary fibrosis Father    Family Psychiatric  History:  Social History:  Social History   Substance and Sexual Activity  Alcohol Use Yes  . Alcohol/week: 0.0 oz     Social History   Substance and Sexual Activity  Drug Use Yes  . Types: Heroin, "Crack" cocaine   Comment: heroin last used 07/14/14, crack    Social History   Socioeconomic History  . Marital status: Single    Spouse name: None  . Number of children: None  . Years of education: None  . Highest education level: None  Social Needs  . Financial resource strain: None  . Food insecurity - worry: None  . Food insecurity - inability: None  . Transportation needs - medical: None  . Transportation needs - non-medical: None  Occupational History  . 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 Smoking status: Current Every Day Smoker    Packs/day: 0.50    Years: 20.00    Pack years: 10.00    Types: Cigarettes  . Smokeless tobacco: Never Used  . Tobacco comment: trying to quit-using  e-cig some  Substance and Sexual Activity  . Alcohol use: Yes    Alcohol/week: 0.0 oz  . Drug use: Yes    Types: Heroin, "Crack" cocaine    Comment: heroin last used 07/14/14, crack  . Sexual activity: Yes    Partners: Male    Birth control/protection: Condom    Comment: Has been HIV tested in past and has been negative.   Other Topics Concern  . None  Social History Narrative   Transgendered male.  Single.  Lives alone.  Independent.   Additional Social History:    Pain Medications: See MAR Prescriptions: See MAR Over the Counter: See MAR History of alcohol / drug use?: Yes Longest period of sobriety (when/how long): 2 years Negative Consequences of Use: Financial, Personal relationships Name of Substance 1: Cocaine 1 - Last Use / Amount: 06/09/17                  Sleep: Fair  Appetite:  Fair  Current Medications: Current Facility-Administered Medications  Medication Dose Route Frequency Provider Last Rate Last Dose  . ARIPiprazole (ABILIFY) tablet 5 mg  5 mg Oral Daily Rankin, Shuvon B, NP   5 mg at 06/14/17 0832  . escitalopram (LEXAPRO) tablet 20 mg  20 mg Oral Daily Izediuno, Vincent A, MD   20 mg at 06/14/17 0830  . estradiol (ESTRACE) tablet 2 mg  2 mg Oral TID Rankin, Shuvon B, NP   2 mg at 06/14/17 1216  . finasteride (PROSCAR) tablet 2.5 mg  2.5 mg Oral Daily Rankin, Shuvon B, NP   2.5 mg at 06/14/17 0831  . hydrOXYzine (ATARAX/VISTARIL) tablet 25 mg  25 mg Oral Q6H PRN Rankin, Shuvon B, NP      . nicotine (NICODERM CQ - dosed in mg/24 hours) patch 21 mg  21 mg Transdermal Daily Izediuno, Delight OvensVincent A, MD   21 mg at 06/14/17 0831  . prazosin (MINIPRESS) capsule 2 mg  2 mg Oral QHS Nira ConnBerry, Jason A, NP   2 mg at 06/13/17 2158  . spironolactone (ALDACTONE) tablet 50 mg  50 mg Oral Daily Izediuno, Delight OvensVincent A, MD   50 mg at 06/14/17 0830  . traZODone (DESYREL) tablet 50 mg  50 mg Oral QHS PRN Rankin, Shuvon B, NP        Lab Results:  No results found for this or any previous visit (from the past 48 hour(s)).  Blood Alcohol level:  Lab Results  Component Value Date   ETH <10 06/12/2017   ETH <5 09/07/2016    Metabolic Disorder Labs: Lab Results  Component Value Date   HGBA1C 5.3 06/12/2017   MPG 105.41 06/12/2017   MPG 103 09/09/2016   Lab Results  Component Value Date    PROLACTIN 6.3 09/09/2016   Lab Results  Component Value Date   CHOL 153 06/12/2017   TRIG 78 06/12/2017   HDL 46 06/12/2017   CHOLHDL 3.3 06/12/2017   VLDL 16 06/12/2017   LDLCALC 91 06/12/2017   LDLCALC 78 09/09/2016    Physical Findings: AIMS: Facial and Oral Movements Muscles of Facial Expression: None, normal Lips and Perioral Area: None, normal Jaw: None, normal Tongue: None, normal,Extremity Movements Upper (arms, wrists, hands, fingers): None, normal Lower (legs, knees, ankles, toes): None, normal, Trunk Movements Neck, shoulders, hips: None, normal, Overall Severity Severity of abnormal movements (highest score from questions above): None, normal Incapacitation due to abnormal movements: None, normal Patient's awareness of abnormal movements (rate only patient's report): No Awareness,  Dental Status Current problems with teeth and/or dentures?: No Does patient usually wear dentures?: No  CIWA:    COWS:     Musculoskeletal: Strength & Muscle Tone: within normal limits Gait & Station: normal Patient leans: N/A  Psychiatric Specialty Exam: Physical Exam  Nursing note and vitals reviewed. Constitutional: She is oriented to person, place, and time. She appears well-developed and well-nourished.  Neurological: She is alert and oriented to person, place, and time.  Psychiatric: She has a normal mood and affect. Her behavior is normal.    Review of Systems  Psychiatric/Behavioral: Positive for depression. The patient is nervous/anxious and has insomnia.     Blood pressure 104/71, pulse (!) 103, temperature 98.3 F (36.8 C), temperature source Oral, resp. rate 16, height 5\' 6"  (1.676 m), weight 74.8 kg (165 lb), SpO2 100 %.Body mass index is 26.63 kg/m.  General Appearance: Disheveled and Guarded and flat   Eye Contact:  Good  Speech:  Clear and Coherent  Volume:  Normal  Mood:  Anxious and Depressed  Affect:  Congruent  Thought Process:  Coherent  Orientation:   Full (Time, Place, and Person)  Thought Content:  Hallucinations: None  Suicidal Thoughts:  No  Homicidal Thoughts:  No  Memory:  Immediate;   Fair Recent;   Fair Remote;   Fair  Judgement:  Fair  Insight:  Fair  Psychomotor Activity:  Normal  Concentration:  Concentration: Fair  Recall:  [employer] of Knowledge:  Fair  Language:  Fair  Akathisia:  No  Handed:  Right  AIMS (if indicated):     Assets:  Communication Skills Desire for Improvement Resilience Social Support  ADL's:  Intact  Cognition:  WNL  Sleep:  Number of Hours: 5.5     Treatment Plan Summary: Daily contact with patient to assess and evaluate symptoms and progress in treatment and Medication management   Continue with current treatment plan on 06/14/2017 except where noted  Mood Stablization    Continue with Abilify 5 mg                Continue Lexapro 20 mg    Insomnia     Continue with Trazodone 50 mg  Will continue to monitor vitals ,medication compliance and treatment side effects while patient is here.  Reviewed labs Glucose 128 elevated, TSH wnl BAL - , UDS + cocaine CSW will start working on disposition.  Patient to participate in therapeutic milieu     Oneta Rack, NP 06/14/2017, 2:38 PM

## 2017-06-14 NOTE — [facility] Group Notes (Signed)
[facility] 06/14/2017 1:15pm  Type of Therapy: Mental Health Association Presentation  Participation Level: Invited. DID NOT ATTEND. Pt chose to remain in bed.   Summary of Progress/Problems: Mental Health Association [facility] Speaker came to talk about his personal journey with mental health. The pt processed ways by which to relate to the speaker. [facility]HA speaker provided handouts and educational information pertaining to groups and services offered by the [facility]. Pt was engaged in speaker's presentation and was receptive to resources provided.    Pulte HomesHeather N Smart, LCSW 06/14/2017 1:56 PM

## 2017-06-14 NOTE — Progress Notes (Signed)
D: Pt was in bed in her room upon initial approach.  Pt presents with depressed affect and mood.  She reports her day was "okay" and she "managed to talk to my landlord."  She reports the conversation went well.  Pt denies SI/HI, denies hallucinations, denies pain.  Pt has minimal interactions with peers.  Pt attended evening group.    A: Introduced self to pt.  Actively listened to pt and offered support and encouragement. Medication administered per order.  PO fluids encouraged and provided.  Reviewed fall prevention techniques with pt, pt verbalized understanding.  Q15 minute safety checks maintained.  R: Pt is safe on the unit.  Pt is compliant with medication.  Pt verbally contracts for safety.  Will continue to monitor and assess.

## 2017-06-14 NOTE — Plan of Care (Signed)
  Progressing Medication: Compliance with prescribed medication regimen will improve 06/14/2017 1600 - Progressing by Lenord Fellersopson, Allin Frix Elizabeth, RN

## 2017-06-15 DIAGNOSIS — F1990 Other psychoactive substance use, unspecified, uncomplicated: Secondary | ICD-10-CM

## 2017-06-15 DIAGNOSIS — F640 Transsexualism: Secondary | ICD-10-CM

## 2017-06-15 DIAGNOSIS — F1490 Cocaine use, unspecified, uncomplicated: Secondary | ICD-10-CM

## 2017-06-15 DIAGNOSIS — F1099 Alcohol use, unspecified with unspecified alcohol-induced disorder: Secondary | ICD-10-CM

## 2017-06-15 NOTE — Progress Notes (Signed)
D: Pt was in bed in her room upon initial approach.  Pt presents with depressed affect and mood.  She brightens with interaction and smiled on occasion while talking to staff tonight.  She describes her day as "okay" and her goal was to "get in touch with some more people and I did."  Pt reports she might discharge tomorrow and she feels safe to do so.  Pt states "the stress of work and knowing if I had a place to live, both of that is gone at least temporarily so it's more manageable to deal with the death of my friend."  Pt reports she has been eating and drinking well today.  Pt denies SI/HI, denies hallucinations, denies pain.  Pt attended evening group.    A: Introduced self to pt.  Actively listened to pt and offered support and encouragement. Medication administered per order.  PO fluids encouraged and provided.  Q15 minute safety checks maintained.  R: Pt is safe on the unit.  Pt is compliant with medications.  Pt verbally contracts for safety.  Will continue to monitor and assess.

## 2017-06-15 NOTE — Plan of Care (Signed)
  Progressing Education: Verbalization of understanding the information provided will improve 06/15/2017 1621 - Progressing by Lenord Fellersopson, Norah Devin Elizabeth, RN

## 2017-06-15 NOTE — Progress Notes (Signed)
[facility] MD Progress Note  06/15/2017 3:35 PM Randy [name]  MRN:  [id number]   Subjective: Randy [name] reports, "I'm still depressed. My depression worsened in the last 2 weeks after I lost a very good friend. I'm doing a little better today, but not that much". Randy [name] is a male patient, transitioning to male gender).   Objective:47 y.o. [name] single, lives alone, on disability, freelance work with building websites. Background history of Depression and SUD . Refused UDS at ER. Presented unaccompanied. Reports worsening depression with suicidal thoughts since his partner died from overdose two weeks ago. Says he has not been able to focus, he has not been able to function. He feels devastated. Patient admits recent relaspe after staying sober for a while. No suicidal behavior since he was 47 years of age. Had some attempts earlier. Patients says he had done well on Lexapro 20 mg and Abilify 5 mg   Today 05-15-17, Randy [name] is seen, chart reviewed. The chart findings discussed with the treatment team. Randy [name] is alert, oriented & aware of situation. Randy [name] is lying down in bed, awake. Randy [name] presents dishevel. Patient continues to ruminate about the passing of a close friend. Reports plans to follow-up with social worker and landlord for housing recourse. Denies suicidal or homicidal ideation during this assessment during this assessment. Denies auditory or visual hallucination and does not appear to be responding to internal stimuli. Randy [name] reports medication compliances is currently denying  mediation side effects. Reports good appetite other wise and resting well. Support, encouragement and reassurance was provided.   Principal Problem: MDD (major depressive disorder), single episode, severe , no psychosis (HCC)  Diagnosis:   Patient Active Problem List   Diagnosis Date Noted  . MDD (major depressive disorder), single episode, severe , no psychosis (HCC) [F32.2] 06/11/2017  . Hepatitis C [B19.20] 03/14/2015  .  Cough [R05] 03/14/2015  . Major depressive disorder, recurrent, severe without psychotic features (HCC) [F33.2]   . Opioid dependence with opioid-induced mood disorder (HCC) [F11.24]   . Cocaine dependence with cocaine-induced mood disorder (HCC) [F14.24]   . MDD (major depressive disorder) [F32.9] 09/17/2014  . MDD (major depressive disorder), recurrent severe, without psychosis (HCC) [F33.2] 07/05/2014  . Major depression, recurrent (HCC) [F33.9] 04/10/2012  . Cocaine dependence (HCC) [F14.20] 04/10/2012  . Polysubstance abuse including IVDA (heroin), cocaine, marijuana [F19.10] 01/10/2012  . Elevated LFTs [R94.5] 01/10/2012  . Chronic hepatitis C without hepatic coma (HCC) [B18.2] 01/10/2012  . 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 Tobacco abuse [Z72.0] 01/10/2012  . Anxiety disorder [F41.9] 08/14/2011  . Recurrent major depression-severe (HCC) [F33.2] 08/13/2011   Total Time spent with patient: 15 minutes  Past Psychiatric History: See H&P  Past Medical History:  Past Medical History:  Diagnosis Date  . Cellulitis  Of  Left Forearm 01/10/2012   From IVDA  . Depression   . Hepatitis C   . 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 I&D EXTREMITY  01/13/2012   Procedure: IRRIGATION AND DEBRIDEMENT EXTREMITY;  Surgeon: Kennieth Rad, MD;  Location: [facility];  Service: Orthopedics;  Laterality: Left;   Family History:  Family History  Problem Relation Age of Onset  . Idiopathic pulmonary fibrosis Father    Family Psychiatric  History: see H&P.  Social History:  Social History   Substance and Sexual Activity  Alcohol Use Yes  . Alcohol/week: 0.0 oz     Social History  Substance and Sexual Activity  Drug Use Yes  . Types: Heroin, "Crack" cocaine   Comment: heroin last used 07/14/14, crack    Social History   Socioeconomic History  . Marital status: Single    Spouse name:  None  . Number of children: None  . Years of education: None  . Highest education level: None  Social Needs  . Financial resource strain: None  . Food insecurity - worry: None  . Food insecurity - inability: None  . Transportation needs - medical: None  . Transportation needs - non-medical: None  Occupational History  . 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 Smoking status: Current Every Day Smoker    Packs/day: 0.50    Years: 20.00    Pack years: 10.00    Types: Cigarettes  . Smokeless tobacco: Never Used  . Tobacco comment: trying to quit-using e-cig some  Substance and Sexual Activity  . Alcohol use: Yes    Alcohol/week: 0.0 oz  . Drug use: Yes    Types: Heroin, "Crack" cocaine    Comment: heroin last used 07/14/14, crack  . Sexual activity: Yes    Partners: Male    Birth control/protection: Condom    Comment: Has been HIV tested in past and has been negative.  Other Topics Concern  . None  Social History Narrative   Transgendered male.  Single.  Lives alone.  Independent.   Additional Social History:    Pain Medications: See MAR Prescriptions: See MAR Over the Counter: See MAR History of alcohol / drug use?: Yes Longest period of sobriety (when/how long): 2 years Negative Consequences of Use: Financial, Personal relationships Name of Substance 1: Cocaine 1 - Last Use / Amount: 06/09/17  Sleep: Fair  Appetite:  Fair  Current Medications: Current Facility-Administered Medications  Medication Dose Route Frequency Provider Last Rate Last Dose  . ARIPiprazole (ABILIFY) tablet 5 mg  5 mg Oral Daily Rankin, Shuvon B, Randy [name]   5 mg at 06/15/17 0749  . escitalopram (LEXAPRO) tablet 20 mg  20 mg Oral Daily Izediuno, Delight Ovens, MD   20 mg at 06/15/17 0749  . estradiol (ESTRACE) tablet 2 mg  2 mg Oral TID Rankin, Shuvon B, Randy [name]   2 mg at 06/15/17 1212  . finasteride (PROSCAR) tablet 2.5 mg  2.5 mg Oral Daily Rankin, Shuvon B, Randy [name]   2.5 mg at 06/15/17  0813  . hydrOXYzine (ATARAX/VISTARIL) tablet 25 mg  25 mg Oral Q6H PRN Rankin, Shuvon B, Randy [name]      . nicotine (NICODERM CQ - dosed in mg/24 hours) patch 21 mg  21 mg Transdermal Daily Izediuno, Delight Ovens, MD   21 mg at 06/15/17 0751  . prazosin (MINIPRESS) capsule 2 mg  2 mg Oral QHS Nira Conn A, Randy [name]   2 mg at 06/14/17 2118  . spironolactone (ALDACTONE) tablet 50 mg  50 mg Oral Daily Georgiann Cocker, MD   50 mg at 06/15/17 0748  . traZODone (DESYREL) tablet 50 mg  50 mg Oral QHS PRN Rankin, Shuvon B, Randy [name]        Lab Results:  No results found for this or any previous visit (from the past 48 hour(s)).  Blood Alcohol level:  Lab Results  Component Value Date   [facility] <10 06/12/2017   ETH <5 09/07/2016   Metabolic Disorder Labs: Lab Results  Component Value Date   HGBA1C 5.3 06/12/2017   MPG 105.41 06/12/2017   MPG 103  09/09/2016   Lab Results  Component Value Date   PROLACTIN 6.3 09/09/2016   Lab Results  Component Value Date   CHOL 153 06/12/2017   TRIG 78 06/12/2017   HDL 46 06/12/2017   CHOLHDL 3.3 06/12/2017   VLDL 16 06/12/2017   LDLCALC 91 06/12/2017   LDLCALC 78 09/09/2016   Physical Findings: AIMS: Facial and Oral Movements Muscles of Facial Expression: None, normal Lips and Perioral Area: None, normal Jaw: None, normal Tongue: None, normal,Extremity Movements Upper (arms, wrists, hands, fingers): None, normal Lower (legs, knees, ankles, toes): None, normal, Trunk Movements Neck, shoulders, hips: None, normal, Overall Severity Severity of abnormal movements (highest score from questions above): None, normal Incapacitation due to abnormal movements: None, normal Patient's awareness of abnormal movements (rate only patient's report): No Awareness, Dental Status Current problems with teeth and/or dentures?: No Does patient usually wear dentures?: No  CIWA:    COWS:     Musculoskeletal: Strength & Muscle Tone: within normal limits Gait & Station:  normal Patient leans: N/A  Psychiatric Specialty Exam: Physical Exam  Nursing note and vitals reviewed. Constitutional: Randy [name] is oriented to person, place, and time. Randy [name] appears well-developed and well-nourished.  Neurological: Randy [name] is alert and oriented to person, place, and time.  Psychiatric: Randy [name] has a normal mood and affect. Her behavior is normal.    Review of Systems  Psychiatric/Behavioral: Positive for depression. The patient is nervous/anxious and has insomnia.     Blood pressure 109/65, pulse 90, temperature 98.1 F (36.7 C), temperature source Oral, resp. rate 16, height 5\' 6"  (1.676 m), weight 74.8 kg (165 lb), SpO2 100 %.Body mass index is 26.63 kg/m.  General Appearance: Disheveled and Guarded and flat   Eye Contact:  Good  Speech:  Clear and Coherent  Volume:  Normal  Mood:  Anxious and Depressed  Affect:  Congruent  Thought Process:  Coherent  Orientation:  Full (Time, Place, and Person)  Thought Content:  Hallucinations: None  Suicidal Thoughts:  No  Homicidal Thoughts:  No  Memory:  Immediate;   Fair Recent;   Fair Remote;   Fair  Judgement:  Fair  Insight:  Fair  Psychomotor Activity:  Normal  Concentration:  Concentration: Fair  Recall:  [employer]Fair  Fund of Knowledge:  Fair  Language:  Fair  Akathisia:  No  Handed:  Right  AIMS (if indicated):     Assets:  Communication Skills Desire for Improvement Resilience Social Support  ADL's:  Intact  Cognition:  WNL  Sleep:  Number of Hours: 5.25   Treatment Plan Summary: Daily contact with patient to assess and evaluate symptoms and progress in treatment and Medication management   Continue with current treatment plan on 06/15/2017 except where noted  Mood Stablization    Continue with Abilify 5 mg                Continue Lexapro 20 mg   Anxiety.                          Continue Hydroxyzine 25 mg po Q 6 hours prn.  Insomnia     Continue with Trazodone 50 mg.  Gender transition regimen.                            Continue estradiol 2 mg po daily.  Continue Spironolactone 50 mg po daily.  Will continue to monitor vitals ,medication compliance and treatment side effects while patient is here.  Reviewed labs Glucose 128 elevated, TSH wnl BAL - , UDS + cocaine CSW will start working on disposition.  Patient to participate in therapeutic milieu  Randy Stammer, Randy [name], PMHNP, FNp-BC 06/15/2017, 3:35 PMPatient ID: Randy [name], adult   DOB: 29-Nov-1970, 47 y.o.   MRN: [id number]

## 2017-06-15 NOTE — Progress Notes (Signed)
Patient ID: Randy [name], adult   DOB: 09-14-1970, 47 y.o.   MRN: [id number]  DAR: Pt. Denies SI/HI and A/V Hallucinations. She reports that her sleep last night was fair, her appetite is fair, her energy level is low, and concentration is poor. She rates her depression level 6/10, her hopelessness level is 6/10, and her anxiety level is 5/10. Patient does not report any pain or discomfort at this time. Support and encouragement provided to the patient. Scheduled medications administered to patient per physician's orders. Patient is seen in the milieu minimally. She reports that she feels ready for discharge soon although her affect remains flat and mood depressed. Q15 minute checks are maintained for safety.

## 2017-06-15 NOTE — [facility] Group Notes (Signed)
LCSW Group Therapy Note   06/15/2017 1:15pm   Type of Therapy and Topic:  Group Therapy:  Overcoming Obstacles   Participation Level:  Active   Description of Group:    In this group patients will be encouraged to explore what they see as obstacles to their own wellness and recovery. They will be guided to discuss their thoughts, feelings, and behaviors related to these obstacles. The group will process together ways to cope with barriers, with attention given to specific choices patients can make. Each patient will be challenged to identify changes they are motivated to make in order to overcome their obstacles. This group will be process-oriented, with patients participating in exploration of their own experiences as well as giving and receiving support and challenge from other group members.   Therapeutic Goals: 1. Patient will identify personal and current obstacles as they relate to admission. 2. Patient will identify barriers that currently interfere with their wellness or overcoming obstacles.  3. Patient will identify feelings, thought process and behaviors related to these barriers. 4. Patient will identify two changes they are willing to make to overcome these obstacles:      Summary of Patient Progress   Randy [name] was active and engaged during today's processing group. She shared that she is feeling better and is ready to discharge on Wed. Randy [name] stated that she tends to isolate and has limited supports but plans to resume [facility] support groups and peer support. "I really liked their groups. They helped me a lot." Randy [name] continues to show progress in the group setting with improving insight.    Therapeutic Modalities:   Cognitive Behavioral Therapy Solution Focused Therapy Motivational Interviewing Relapse Prevention Therapy  Ledell PeoplesHeather N Smart, LCSW 06/15/2017 12:37 PM

## 2017-06-16 DIAGNOSIS — F332 Major depressive disorder, recurrent severe without psychotic features: Principal | ICD-10-CM

## 2017-06-16 DIAGNOSIS — F1910 Other psychoactive substance abuse, uncomplicated: Secondary | ICD-10-CM

## 2017-06-16 MED ORDER — PRAZOSIN HCL 2 MG PO CAPS: 2.0000 mg | ORAL_CAPSULE | Freq: Every day | ORAL | 0 refills | Status: DC

## 2017-06-16 MED ORDER — FINASTERIDE 5 MG PO TABS: 2.5000 mg | ORAL_TABLET | Freq: Every day | ORAL | 0 refills | Status: DC

## 2017-06-16 MED ORDER — ESTRADIOL 2 MG PO TABS: 2.0000 mg | ORAL_TABLET | Freq: Three times a day (TID) | ORAL | 0 refills | Status: DC

## 2017-06-16 MED ORDER — ESCITALOPRAM OXALATE 20 MG PO TABS: 20.0000 mg | ORAL_TABLET | Freq: Every day | ORAL | 0 refills | Status: DC

## 2017-06-16 MED ORDER — SPIRONOLACTONE 50 MG PO TABS: 50.0000 mg | ORAL_TABLET | Freq: Every day | ORAL | Status: DC

## 2017-06-16 MED ORDER — NICOTINE 21 MG/24HR TD PT24: 21.0000 mg | MEDICATED_PATCH | Freq: Every day | TRANSDERMAL | 0 refills | Status: DC

## 2017-06-16 MED ORDER — TRAZODONE HCL 50 MG PO TABS: 50.0000 mg | ORAL_TABLET | Freq: Every evening | ORAL | 0 refills | Status: DC | PRN

## 2017-06-16 MED ORDER — HYDROXYZINE HCL 25 MG PO TABS: 25.0000 mg | ORAL_TABLET | Freq: Four times a day (QID) | ORAL | 0 refills | Status: DC | PRN

## 2017-06-16 MED ORDER — ARIPIPRAZOLE 5 MG PO TABS: 5.0000 mg | ORAL_TABLET | Freq: Every day | ORAL | 0 refills | Status: DC

## 2017-06-16 NOTE — Progress Notes (Signed)
  [facility] :  Will you be returning to the same living situation after discharge:  Yes,  home At discharge, do you have transportation home?: Yes,  bus or friend Do you have the ability to pay for your medications: Yes,  Medicare  Release of information consent forms completed and submitted to medical records by CSW,  Patient to Follow up at: Follow-up Information    [facility] Follow up on 06/23/2017.   Specialty:  Behavioral Health Why:  Therapy with Delice Bisonara on 1:00PM on Wed 2/13. Medication management appt with Collie SiadAngela Johnson PA on Wed 2/20 at 5:00PM.  Contact information: [address] [place] [address] [phone]           Next level of care provider has [facility] to [facility] Link:no  Safety Planning and Suicide Prevention discussed: Yes,  SPE completed with pt; pt declined to consent to family contact.   Have you used any form of tobacco in the last 30 days? (Cigarettes, Smokeless Tobacco, Cigars, and/or Pipes): Yes  Has patient been referred to the Quitline?: Patient refused referral  Patient has been referred for addiction treatment: Yes  Pulte HomesHeather N Smart, LCSW 06/16/2017, 8:55 AM

## 2017-06-16 NOTE — Progress Notes (Signed)
Adult Psychoeducational Group Note  Date:  06/16/2017 Time:  9:36 AM  Group Topic/Focus: Orientation/Goals group.   Participation Level:  Active  Participation Quality:  Appropriate  Affect:  Appropriate  Cognitive:  Appropriate  Insight: Appropriate and Good  Engagement in Group:  Engaged  Modes of Intervention:  Discussion and Orientation  Additional Comments:  Pt attended orientation/goals group this morning. Staff discussed treatment plan agreement and unit rules. Pt participated in group. Pt goal for today is to work on preparing for discharge. Pt was excited about leaving today and stated she has learned a lot from being here. Pt was appropriate and pleasant in group.

## 2017-06-16 NOTE — [facility] Suicide Risk Assessment (Signed)
[facility] Discharge Suicide Risk Assessment   Principal Problem: MDD (major depressive disorder), recurrent severe, without psychosis (HCC) Discharge Diagnoses:  Patient Active Problem List   Diagnosis Date Noted  . MDD (major depressive disorder), single episode, severe , no psychosis (HCC) [F32.2] 06/11/2017  . Hepatitis C [B19.20] 03/14/2015  . Cough [R05] 03/14/2015  . Major depressive disorder, recurrent, severe without psychotic features (HCC) [F33.2]   . Opioid dependence with opioid-induced mood disorder (HCC) [F11.24]   . Cocaine dependence with cocaine-induced mood disorder (HCC) [F14.24]   . MDD (major depressive disorder) [F32.9] 09/17/2014  . MDD (major depressive disorder), recurrent severe, without psychosis (HCC) [F33.2] 07/05/2014  . Major depression, recurrent (HCC) [F33.9] 04/10/2012  . Cocaine dependence (HCC) [F14.20] 04/10/2012  . Polysubstance abuse including IVDA (heroin), cocaine, marijuana [F19.10] 01/10/2012  . Elevated LFTs [R94.5] 01/10/2012  . Chronic hepatitis C without hepatic coma (HCC) [B18.2] 01/10/2012  . 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 Tobacco abuse [Z72.0] 01/10/2012  . Anxiety disorder [F41.9] 08/14/2011  . Recurrent major depression-severe (HCC) [F33.2] 08/13/2011    Total Time spent with patient: 30 minutes  Musculoskeletal: Strength & Muscle Tone: within normal limits Gait & Station: normal Patient leans: N/A  Psychiatric Specialty Exam: Review of Systems  Constitutional: Negative for chills and fever.  Respiratory: Negative for cough.   Cardiovascular: Negative for chest pain.  Gastrointestinal: Negative for abdominal pain, diarrhea, heartburn and nausea.  Psychiatric/Behavioral: Negative for depression, hallucinations and suicidal ideas. The patient is not nervous/anxious.     Blood pressure 118/68, pulse 64, temperature 98.1 F (36.7 C), temperature source Oral, resp. rate 18, height 5\' 6"  (1.676  m), weight 74.8 kg (165 lb), SpO2 100 %.Body mass index is 26.63 kg/m.  General Appearance: Casual and Fairly Groomed  Eye Contact::  Good  Speech:  Clear and Coherent and Normal Rate  Volume:  Normal  Mood:  Euthymic  Affect:  Appropriate and Congruent  Thought Process:  Coherent and Goal Directed  Orientation:  Full (Time, Place, and Person)  Thought Content:  Logical  Suicidal Thoughts:  No  Homicidal Thoughts:  No  Memory:  Immediate;   Good Recent;   Good Remote;   Good  Judgement:  Fair  Insight:  Fair  Psychomotor Activity:  Normal  Concentration:  Fair  Recall:  [employer] of Knowledge:Fair  Language: Fair  Akathisia:  No  Handed:    AIMS (if indicated):     Assets:  Communication Skills Desire for Improvement Housing Leisure Time Resilience Social Support  Sleep:  Number of Hours: 5.25  Cognition: WNL  ADL's:  Intact   Mental Status Per Nursing Assessment::   On Admission:  Suicidal ideation indicated by patient  Demographic Factors:  Male, Caucasian, Gay, lesbian, or bisexual orientation and Living alone  Loss Factors: Financial problems/change in socioeconomic status  Historical Factors: Anniversary of important loss and Impulsivity  Risk Reduction Factors:   Positive social support, Positive therapeutic relationship and Positive coping skills or problem solving skills  Continued Clinical Symptoms:  Depression:   Severe  Cognitive Features That Contribute To Risk:  None    Suicide Risk:  Minimal: No identifiable suicidal ideation.  Patients presenting with no risk factors but with morbid ruminations; may be classified as minimal risk based on the severity of the depressive symptoms  Follow-up Information    [facility] Follow up on 06/23/2017.   Specialty:  Behavioral Health Why:  Therapy with Delice Bison on 1:00PM  on Wed 2/13. Medication management appt with Collie SiadAngela Johnson PA on Wed 2/20 at 5:00PM.  Contact information: [address] [place] [address] [phone]         Subjective Data:  Randy [name] is a 47 y/o male to male transgender individual with history of MDD, alcohol use disorder, and cocaine use disorder who was admitted with worsening depression and SI in the context of recent death of a partner from a drug overdose. Pt was restarted on abilify and lexapro, and she reported improvement of mood symptoms.  Today upon evaluation, pt shares, "I'm feeling a lot better than when I came in." Pt denies SI/HI/AH/VH. She is sleeping well. Her appetite is good. She has been participating in groups and the therapeutic milieu. Pt shares that she has improved insight regarding stressor of anniversary of her partner's death, stating, "I think I'm able to process things about my friend's death, and I'm not so stressed about work any more." Pt is in agreement to continue her current regimen without changes after discharge, and she [facility]. She was able to engage in safety planning including plan [facility] or contact emergency services if she feels unable to maintain her own safety or the safety of others. Pt had no further questions, comments, or concerns.    Plan Of Care/Follow-up recommendations:   -Discharge to outpatient level of care  -MDD, recurrent, severe, without psychosis        -Continue withAbilify 5 mg po qday        -Continue Lexapro 20 mg  po qday  -Anxiety.        - Continue Hydroxyzine 25 mg po Q 6 hours prn.  -Insomnia       - Continue with Trazodone 50mg .  -Gender transition regimen.                           -Continue estradiol 2 mg po daily.                           -Continue Spironolactone 50 mg po daily.    Activity:  as tolerated Diet:  normal Tests:  NA Other:  see above for DC plan  Micheal Likenshristopher T Heylee Tant, MD 06/16/2017, 10:29 AM

## 2017-06-16 NOTE — Progress Notes (Signed)
Patient ID: Randy [name], adult   DOB: 06-13-1970, 47 y.o.   MRN: [id number]  Discharge Note- Belongings returned to patient at time of discharge. Patient denies any pain or discomfort. Discharge instructions and medications were reviewed with patient. Patient verbalized understanding of both medications and discharge instructions. Patient discharged to lobby with a [employer] bus pass. Patient discharged in no apparent distress. Q15 minute safety checks maintained until time of discharge.

## 2017-06-16 NOTE — Progress Notes (Signed)
Adult Psychoeducational Group Note  Date:  06/16/2017 Time:  11:21 AM  Group Topic/Focus:  Developing a Wellness Toolbox:   The focus of this group is to help patients develop a "wellness toolbox" with skills and strategies to promote recovery upon discharge.  Participation Level:  Minimal  Participation Quality:  Appropriate and Attentive  Affect:  Appropriate  Cognitive:  Appropriate  Insight: Appropriate and Good  Engagement in Group:  Engaged  Modes of Intervention:  Activity and Discussion  Additional Comments:  Pt attended group this morning and participated in group. Today's topic is personal development. Pt did not share much in group. Pt is preparing for discharge. Pt was appropriate and pleasant.   Kouper Spinella A 06/16/2017, 11:21 AM

## 2017-06-16 NOTE — Progress Notes (Signed)
Adult Psychoeducational Group Note  Date:  06/16/2017 Time:  12:14 AM  Group Topic/Focus:  Wrap-Up Group:   The focus of this group is to help patients review their daily goal of treatment and discuss progress on daily workbooks.  Participation Level:  Minimal  Participation Quality:  Appropriate  Affect:  Appropriate  Cognitive:  Appropriate  Insight: Appropriate  Engagement in Group:  Engaged  Modes of Intervention:  Discussion  Additional Comments:  Patient rated her day a seven. Patient goal was to stay positive. Patient has met her goal.  [name] 06/16/2017, 12:14 AM

## 2017-06-16 NOTE — Discharge Summary (Signed)
Physician Discharge Summary Note  Patient:  Randy [name] is an 47 y.o., adult  MRN:  [id number]  DOB:  [birth year]  Patient phone:  [phone] (home)   Patient address:   [address] [place] [address],   Total Time spent with patient: Greater than 30 minutes  Date of Admission:  06/11/2017  Date of Discharge: 06/17/2015  Reason for Admission:  Worsening symptoms of depression & suicidal thoughts.  Principal Problem: MDD (major depressive disorder), recurrent severe, without psychosis [facility])  Discharge Diagnoses: Patient Active Problem List   Diagnosis Date Noted  . MDD (major depressive disorder), single episode, severe , no psychosis (HCC) [F32.2] 06/11/2017  . Hepatitis C [B19.20] 03/14/2015  . Cough [R05] 03/14/2015  . Major depressive disorder, recurrent, severe without psychotic features (HCC) [F33.2]   . Opioid dependence with opioid-induced mood disorder (HCC) [F11.24]   . Cocaine dependence with cocaine-induced mood disorder (HCC) [F14.24]   . MDD (major depressive disorder) [F32.9] 09/17/2014  . MDD (major depressive disorder), recurrent severe, without psychosis (HCC) [F33.2] 07/05/2014  . Major depression, recurrent (HCC) [F33.9] 04/10/2012  . Cocaine dependence (HCC) [F14.20] 04/10/2012  . Polysubstance abuse including IVDA (heroin), cocaine, marijuana [F19.10] 01/10/2012  . Elevated LFTs [R94.5] 01/10/2012  . Chronic hepatitis C without hepatic coma (HCC) [B18.2] 01/10/2012  . 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 Tobacco abuse [Z72.0] 01/10/2012  . Anxiety disorder [F41.9] 08/14/2011  . Recurrent major depression-severe (HCC) [F33.2] 08/13/2011   Musculoskeletal: Strength & Muscle Tone: within normal limits Gait & Station: normal Patient leans: N/A  Psychiatric Specialty Exam:  See Suicide Risk Assessment Physical Exam  Nursing note and vitals reviewed. Constitutional: She is oriented to person, place, and  time. She appears well-developed.  HENT:  Head: Normocephalic.  Eyes: Pupils are equal, round, and reactive to light.  Neck: Normal range of motion.  Cardiovascular: Normal rate.  Respiratory: Effort normal.  GI: Soft.  Genitourinary:  Genitourinary Comments: Deferred  Musculoskeletal: Normal range of motion.  Neurological: She is alert and oriented to person, place, and time.  Skin: Skin is warm.  Psychiatric: She has a normal mood and affect. Her speech is normal and behavior is normal. Judgment and thought content normal. Cognition and memory are normal.    Review of Systems  Constitutional: Negative.   HENT: Negative.   Eyes: Negative.   Respiratory: Negative.   Cardiovascular: Negative.   Gastrointestinal: Negative.   Genitourinary: Negative.   Musculoskeletal: Negative.   Skin: Negative.   Neurological: Negative.   Endo/Heme/Allergies: Negative.   Psychiatric/Behavioral: Positive for depression (Stable) and substance abuse (Positive for cocaine and opiates ). Negative for hallucinations, memory loss and suicidal ideas. The patient is nervous/anxious (Stable). Insomnia: Stable.   All other systems reviewed and are negative.   Blood pressure 118/68, pulse 64, temperature 98.1 F (36.7 C), temperature source Oral, resp. rate 18, height 5\' 6"  (1.676 m), weight 74.8 kg (165 lb), SpO2 100 %.Body mass index is 26.63 kg/m.  Have you used any form of tobacco in the last 30 days? (Cigarettes, Smokeless Tobacco, Cigars, and/or Pipes): Yes  Has this patient used any form of tobacco in the last 30 days? (Cigarettes, Smokeless Tobacco, Cigars, and/or Pipes): Yes, an FDA-approved tobacco cessation medication was offered at discharge.  Past Medical History:  Past Medical History:  Diagnosis Date  . Cellulitis  Of  Left Forearm 01/10/2012   From IVDA  . Depression   . Hepatitis C   .  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 I&D EXTREMITY  01/13/2012   Procedure: IRRIGATION AND DEBRIDEMENT EXTREMITY;  Surgeon: Kennieth Rad, MD;  Location: [facility];  Service: Orthopedics;  Laterality: Left;   Family History:  Family History  Problem Relation Age of Onset  . Idiopathic pulmonary fibrosis Father    Social History:  Social History   Substance and Sexual Activity  Alcohol Use Yes  . Alcohol/week: 0.0 oz     Social History   Substance and Sexual Activity  Drug Use Yes  . Types: Heroin, "Crack" cocaine   Comment: heroin last used 07/14/14, crack    Social History   Socioeconomic History  . Marital status: Single    Spouse name: None  . Number of children: None  . Years of education: None  . Highest education level: None  Social Needs  . Financial resource strain: None  . Food insecurity - worry: None  . Food insecurity - inability: None  . Transportation needs - medical: None  . Transportation needs - non-medical: None  Occupational History  . 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 Smoking status: Current Every Day Smoker    Packs/day: 0.50    Years: 20.00    Pack years: 10.00    Types: Cigarettes  . Smokeless tobacco: Never Used  . Tobacco comment: trying to quit-using e-cig some  Substance and Sexual Activity  . Alcohol use: Yes    Alcohol/week: 0.0 oz  . Drug use: Yes    Types: Heroin, "Crack" cocaine    Comment: heroin last used 07/14/14, crack  . Sexual activity: Yes    Partners: Male    Birth control/protection: Condom    Comment: Has been HIV tested in past and has been negative.  Other Topics Concern  . None  Social History Narrative   Transgendered male.  Single.  Lives alone.  Independent.   Risk to Self: Suicidal Ideation: Yes-Currently Present Suicidal Intent: No Is patient at risk for suicide?: Yes Suicidal Plan?: No Access to Means: Yes Specify Access to Suicidal Means: Cocaine/Heroin What has been your use of drugs/alcohol within  the last 12 months?: Cocaine How many times?: 1 Triggers for Past Attempts: Unknown Intentional Self Injurious Behavior: None Risk to Others: Homicidal Ideation: No Thoughts of Harm to Others: No Current Homicidal Intent: No Current Homicidal Plan: No Access to Homicidal Means: No History of harm to others?: No Assessment of Violence: None Noted Does patient have access to weapons?: No Criminal Charges Pending?: No Does patient have a court date: No Prior Inpatient Therapy: Prior Inpatient Therapy: Yes Prior Therapy Dates: 2018 Prior Therapy Facilty/Provider(s): [facility] Reason for Treatment: SI/Depression Prior Outpatient Therapy: Prior Outpatient Therapy: Yes Prior Therapy Dates: Ongoing Prior Therapy Facilty/Provider(s): [facility] Reason for Treatment: Depression Does patient have an ACCT team?: No Does patient have Intensive In-House Services?  : No Does patient have [facility] services? : Yes Does patient have P4CC services?: No  Level of Care:  OP  Hospital Course: (Per Md's SRA): Jaedan Huttner is a 47 y/o male to male transgender individual with history of MDD, alcohol use disorder, and cocaine use disorder who was admitted with worsening depression and SI in the context of recent death of a partner from a drug overdose. Pt was restarted on abilify and lexapro, and she reported improvement of mood symptoms.  After evaluation of her presenting symptoms  on the day of her admission. It was determined that [name] will need medication managment for her worsening symptoms & mood stabilization treatments. She received & was discharged on; Lexapro 20 mg for depression, Hydroxyzine 25 mg prn for anxiety, Nicotine patch 21 mg for smoking cessation & Trazodone 50 mg for insomnia. She was enrolled & participated in the group counseling sessions being offered & held on this unit. She participated & learned coping skills. She was resumed on all her pertinent home medications for her  trans-gendering process.  Today upon her discharge evaluation with the attending psychiatrist, [name] shares, "I'm feeling a lot better than when I came in." Pt denies SI/HI/AH/VH. She is sleeping well. Her appetite is good. She has been participating in groups and the therapeutic milieu. Pt shares that she has improved insight regarding stressor of anniversary of her partner's death, stating, "I think I'm able to process things about my friend's death, and I'm not so stressed about work any more." Pt is in agreement to continue her current regimen without changes after discharge, and she plans to [facility] up at [facility]. She was able to engage in safety planning including plan to [facility] or contact emergency services if she feels unable to maintain her own safety or the safety of others. Pt had no further questions, comments, or concerns.  [facility] in no apparent distress. Transportation per the city bus/friend. [facility] assisted with bus pass.  Discharge Vitals:   Blood pressure 118/68, pulse 64, temperature 98.1 F (36.7 C), temperature source Oral, resp. rate 18, height 5\' 6"  (1.676 m), weight 74.8 kg (165 lb), SpO2 100 %. Body mass index is 26.63 kg/m.  Lab Results:   No results found for this or any previous visit (from the past 72 hour(s)).  Physical Findings: AIMS: Facial and Oral Movements Muscles of Facial Expression: None, normal Lips and Perioral Area: None, normal Jaw: None, normal Tongue: None, normal,Extremity Movements Upper (arms, wrists, hands, fingers): None, normal Lower (legs, knees, ankles, toes): None, normal, Trunk Movements Neck, shoulders, hips: None, normal, Overall Severity Severity of abnormal movements (highest score from questions above): None, normal Incapacitation due to abnormal movements: None, normal Patient's awareness of abnormal movements (rate only patient's report): No Awareness, Dental Status Current  problems with teeth and/or dentures?: No Does patient usually wear dentures?: No  CIWA:    COWS:     See Psychiatric Specialty Exam and Suicide Risk Assessment completed by Attending Physician prior to discharge.  Discharge destination:  Home  Is patient on multiple antipsychotic therapies at discharge:  No   Has Patient had three or more failed trials of antipsychotic monotherapy by history:  No  Recommended Plan for Multiple Antipsychotic Therapies: NA  Allergies as of 06/16/2017      Reactions   Penicillins Other (See Comments)   Swollen joints       Medication List    TAKE these medications     Indication  ARIPiprazole 5 MG tablet Commonly known as:  ABILIFY Take 1 tablet (5 mg total) by mouth daily. For mood control What changed:  additional instructions  Indication:  Mood control   escitalopram 20 MG tablet Commonly known as:  LEXAPRO Take 1 tablet (20 mg total) by mouth daily. For depression What changed:    medication strength  how much to take  additional instructions  Indication:  Generalized Anxiety Disorder, Major Depressive Disorder   estradiol 2 MG tablet Commonly known as:  ESTRACE Take 1 tablet (2 mg total) by mouth 3 (three) times daily. For hormonal supplementation What changed:  additional instructions  Indication:  Deficiency of the Hormone Estrogen   finasteride 5 MG tablet Commonly known as:  PROSCAR Take 0.5 tablets (2.5 mg total) by mouth daily. For hormonal treatment for male transgender process: Take 1/2 tablet What changed:  additional instructions  Indication:  Part of  hormonal therapy for transgendering   hydrOXYzine 25 MG tablet Commonly known as:  ATARAX/VISTARIL Take 1 tablet (25 mg total) by mouth every 6 (six) hours as needed for anxiety.  Indication:  Feeling Anxious   nicotine 21 mg/24hr patch Commonly known as:  NICODERM CQ - dosed in mg/24 hours Place 1 patch (21 mg total) onto the skin daily. (May purchase from over  the counter): For smoking cessation  Indication:  Nicotine Addiction   prazosin 2 MG capsule Commonly known as:  MINIPRESS Take 1 capsule (2 mg total) by mouth at bedtime. For nightmares What changed:  additional instructions  Indication:  Nightmares   spironolactone 50 MG tablet Commonly known as:  ALDACTONE Take 1 tablet (50 mg total) by mouth daily. Part treatment for transgender process. What changed:  additional instructions  Indication:  Part treatment for transgender process.   traZODone 50 MG tablet Commonly known as:  DESYREL Take 1 tablet (50 mg total) by mouth at bedtime as needed for sleep.  Indication:  Trouble Sleeping      Follow-up [facility] Follow up on 06/23/2017.   Specialty:  Behavioral Health Why:  Therapy with Delice Bison on 1:00PM on Wed 2/13. Medication management appt with Collie Siad PA on Wed 2/20 at 5:00PM.  Contact information: [address] [place] [address] [phone]          Follow-up recommendations: Activity:  As tolerated Diet: As recommended by your primary care doctor. Keep all scheduled follow-up appointments as recommended.   Comments: Patient is instructed prior to discharge to: Take all medications as prescribed by his/her mental healthcare provider. Report any adverse effects and or reactions from the medicines to his/her outpatient provider promptly. Patient has been instructed & cautioned: To not engage in alcohol and or illegal drug use while on prescription medicines. In the event of worsening symptoms, patient is instructed to call the crisis hotline, 911 and or go to the nearest ED for appropriate evaluation and treatment of symptoms. To follow-up with his/her primary care provider for your other medical issues, concerns and or health care needs.   Signed: Armandina Stammer, PMHNP, FNP-BC 06/17/2017, 1:46 PM    Patient seen, Suicide Assessment Completed.  Disposition Plan Reviewed

## 2017-06-16 NOTE — Progress Notes (Addendum)
Recreation Therapy Notes  Date: 06/16/17 Time: 0930 Location: 300 [address] Dayroom  Group Topic: Stress Management  Goal Area(s) Addresses:  Patient will verbalize importance of using healthy stress management.  Patient will identify positive emotions associated with healthy stress management.   Behavioral Response: Engaged  Intervention: Stress Management  Activity :  Guided Imagery.  LRT introduced the stress management technique of guided imagery.  LRT read a script that guided patients to envision laying out in the sun on a warm day, enjoying the breeze and sunshine.  Patients were to listen and follow along as script was read.  Education:  Stress Management, Discharge Planning.   Education Outcome: Acknowledges edcuation/In group clarification offered/Needs additional education  Clinical Observations/Feedback: Pt attended group.    Randy [name], LRT/CTRS         Lillia AbedLindsay, Mamie Diiorio A 06/16/2017 11:05 AM

## 2017-06-16 NOTE — Progress Notes (Signed)
Patient ID: Randy [name], adult   DOB: 15-Nov-1970, 47 y.o.   MRN: [id number]  DAR: Pt. Denies SI/HI and A/V Hallucinations. She reports that her sleep last night was fair, her appetite is fair, her energy level is low, and her concentration is poor. She rates her depression, hopelessness, and anxiety level is 4/10. Patient does not report any pain or discomfort at this time. Support and encouragement provided to the patient. Scheduled medications administered to patient per physician's orders. Patient is receptive and cooperative. She reports that she feels ready for discharge soon. Q15 minute checks are maintained for safety.

## 2017-06-16 NOTE — Progress Notes (Signed)
Patient ID: Randy [name], adult   DOB: 1970-09-28, 47 y.o.   MRN: [id number] PER STATE REGULATIONS 482.30  THIS CHART WAS REVIEWED FOR MEDICAL NECESSITY WITH RESPECT TO THE PATIENT'S ADMISSION/ DURATION OF STAY.  NEXT REVIEW DATE: 06/19/2017  [name] Robt Okuda, RN, BSN CASE MANAGER

## 2017-06-21 ENCOUNTER — Ambulatory Visit (INDEPENDENT_AMBULATORY_CARE_PROVIDER_SITE_OTHER): Payer: Medicare Other | Admitting: Internal Medicine

## 2017-06-21 ENCOUNTER — Encounter: Payer: Self-pay | Admitting: Internal Medicine

## 2017-06-21 VITALS — Wt 179.0 lb

## 2017-06-21 DIAGNOSIS — Z720 Tobacco use: Secondary | ICD-10-CM | POA: Diagnosis not present

## 2017-06-21 DIAGNOSIS — Z23 Encounter for immunization: Secondary | ICD-10-CM | POA: Diagnosis not present

## 2017-06-21 DIAGNOSIS — B182 Chronic viral hepatitis C: Secondary | ICD-10-CM

## 2017-06-21 DIAGNOSIS — K7400 Hepatic fibrosis, unspecified: Secondary | ICD-10-CM

## 2017-06-21 MED ORDER — LEDIPASVIR-SOFOSBUVIR 90-400 MG PO TABS: 1.0000 | ORAL_TABLET | Freq: Every day | ORAL | 2 refills | Status: DC

## 2017-06-21 NOTE — Patient Instructions (Signed)
Date 06/21/17  Dear Ms [name], As discussed in the ID Clinic, your hepatitis C therapy will include highly effective medication(s) for treatment and will vary based on the type of hepatitis C and insurance approval.  Potential medications include:          Harvoni (sofosbuvir 90mg /ledipasvir 400mg ) tablet oral daily              Medications are typically for 8 or 12 weeks total ---------------------------------------------------------------- Your HCV Treatment Start Date: You will be notified by our office once the medication is approved and where you can pick it up (or if mailed)   ---------------------------------------------------------------- YOUR PHARMACY CONTACT:   [facility] [address] [place], [place] [address] Phone: [phone] Hours: Monday to Friday 7:30 am to 6:00 pm   Please always contact your pharmacy at least 3-4 business days before you run out of medications to ensure your next month's medication is ready or 1 week prior to running out if you receive it by mail.  Remember, each prescription is for 28 days. ---------------------------------------------------------------- GENERAL NOTES REGARDING YOUR HEPATITIS C MEDICATION:  Some medications have the following interactions:  - Acid reducing agents such as H2 blockers (ie. Pepcid (famotidine), Zantac (ranitidine), Tagamet (cimetidine), Axid (nizatidine) and proton pump inhibitors (ie. Prilosec (omeprazole), Protonix (pantoprazole), Nexium (esomeprazole), or Aciphex (rabeprazole)). Do not take until you have discussed with a health care provider.    -Antacids that contain magnesium and/or aluminum hydroxide (ie. Milk of Magensia, Rolaids, Gaviscon, Maalox, Mylanta, an dArthritis Pain Formula).  -Calcium carbonate (calcium supplements or antacids such as Tums, Caltrate, Os-Cal).  -St. John's wort or any products that contain St. John's wort like some herbal supplements  Please inform the office  prior to starting any of these medications.  - The common side effects associated with Harvoni include:      1. Fatigue      2. Headache      3. Nausea      4. Diarrhea      5. Insomnia  Please note that this only lists the most common side effects and is NOT a comprehensive list of the potential side effects of these medications. For more information, please review the drug information sheets that come with your medication package from the pharmacy.  ---------------------------------------------------------------- GENERAL HELPFUL HINTS ON HCV THERAPY: 1. Stay well-hydrated. 2. Notify the ID Clinic of any changes in your other over-the-counter/herbal or prescription medications. 3. If you miss a dose of your medication, take the missed dose as soon as you remember. Return to your regular time/dose schedule the next day.  4.  Do not stop taking your medications without first talking with your healthcare provider. 5.  You may take Tylenol (acetaminophen), as long as the dose is less than 2000 mg (OR no more than 4 tablets of the Tylenol Extra Strengths 500mg  tablet) in 24 hours. 6.  You will see our pharmacist-specialist within the first 2 weeks of starting your medication to monitor for any possible side effects. 7.  You will have labs once during treatment, soon after treatment completion and one final lab 6 months after treatment completion to verify the virus is out of your system.  Gardiner Barefootobert W Ahmir Bracken, MD  [facility] [address] [place], [place]  [address] [phone]

## 2017-06-22 DIAGNOSIS — K7400 Hepatic fibrosis, unspecified: Secondary | ICD-10-CM | POA: Insufficient documentation

## 2017-06-22 NOTE — Assessment & Plan Note (Signed)
F2-3 and Firbrosure reassuring.  No HCC screening indicated.

## 2017-06-22 NOTE — Assessment & Plan Note (Signed)
With genotype 1a, will get her on Harvoni.  Will need 12 weeks with higher viral load

## 2017-06-22 NOTE — Assessment & Plan Note (Signed)
Encouraged cessation.

## 2017-06-22 NOTE — Progress Notes (Signed)
   Subjective:    Patient ID: Randy [name], adult    DOB: 24-Jun-1970, 47 y.o.   MRN: [id number]  HPI Here for follow up of HCV Randy [name] is here after getting labs and elastography.  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 Elastography F2-3 and Fibrosure F1-2.  Normal portal vein on ultrasound.   She did relapse with drugs but no IVDU.  [facility]  She is following with [facility] now as well.     Review of Systems  Constitutional: Negative for fatigue.  Skin: Negative for rash.  Neurological: Negative for dizziness.       Objective:   Physical Exam  Constitutional: She appears well-developed and well-nourished. No distress.  HENT:  Mouth/Throat: No oropharyngeal exudate.  Eyes: No scleral icterus.  Cardiovascular: Normal rate, regular rhythm and normal heart sounds.  No murmur heard. Pulmonary/Chest: Effort normal and breath sounds normal. No respiratory distress.  Skin: No rash noted.   SH: + tobacco       Assessment & Plan:

## 2017-07-13 DIAGNOSIS — F332 Major depressive disorder, recurrent severe without psychotic features: Secondary | ICD-10-CM | POA: Diagnosis not present

## 2017-09-29 DIAGNOSIS — F332 Major depressive disorder, recurrent severe without psychotic features: Secondary | ICD-10-CM | POA: Diagnosis not present

## 2017-11-16 ENCOUNTER — Other Ambulatory Visit: Payer: Self-pay

## 2017-11-16 ENCOUNTER — Inpatient Hospital Stay (HOSPITAL_COMMUNITY)
Admission: RE | Admit: 2017-11-16 | Discharge: 2017-11-23 | DRG: 885 | Disposition: A | Discharge status: Home / Self Care | Payer: Medicare Other | Attending: Psychiatry | Admitting: Psychiatry

## 2017-11-16 ENCOUNTER — Encounter (HOSPITAL_COMMUNITY): Payer: Self-pay | Admitting: *Deleted

## 2017-11-16 DIAGNOSIS — F1490 Cocaine use, unspecified, uncomplicated: Secondary | ICD-10-CM | POA: Diagnosis not present

## 2017-11-16 DIAGNOSIS — Z79891 Long term (current) use of opiate analgesic: Secondary | ICD-10-CM | POA: Diagnosis not present

## 2017-11-16 DIAGNOSIS — K740 Hepatic fibrosis: Secondary | ICD-10-CM | POA: Diagnosis present

## 2017-11-16 DIAGNOSIS — R45851 Suicidal ideations: Secondary | ICD-10-CM

## 2017-11-16 DIAGNOSIS — Z79899 Other long term (current) drug therapy: Secondary | ICD-10-CM

## 2017-11-16 DIAGNOSIS — F332 Major depressive disorder, recurrent severe without psychotic features: Secondary | ICD-10-CM | POA: Diagnosis not present

## 2017-11-16 DIAGNOSIS — F515 Nightmare disorder: Secondary | ICD-10-CM | POA: Diagnosis not present

## 2017-11-16 DIAGNOSIS — F419 Anxiety disorder, unspecified: Secondary | ICD-10-CM | POA: Diagnosis present

## 2017-11-16 DIAGNOSIS — F649 Gender identity disorder, unspecified: Secondary | ICD-10-CM | POA: Diagnosis present

## 2017-11-16 DIAGNOSIS — F1420 Cocaine dependence, uncomplicated: Secondary | ICD-10-CM | POA: Diagnosis present

## 2017-11-16 DIAGNOSIS — Z915 Personal history of self-harm: Secondary | ICD-10-CM | POA: Diagnosis not present

## 2017-11-16 DIAGNOSIS — B182 Chronic viral hepatitis C: Secondary | ICD-10-CM | POA: Diagnosis present

## 2017-11-16 DIAGNOSIS — G4700 Insomnia, unspecified: Secondary | ICD-10-CM | POA: Diagnosis present

## 2017-11-16 DIAGNOSIS — Z736 Limitation of activities due to disability: Secondary | ICD-10-CM | POA: Diagnosis not present

## 2017-11-16 DIAGNOSIS — F17210 Nicotine dependence, cigarettes, uncomplicated: Secondary | ICD-10-CM | POA: Diagnosis present

## 2017-11-16 DIAGNOSIS — Z880 Allergy status to penicillin: Secondary | ICD-10-CM

## 2017-11-16 LAB — CBC
HEMATOCRIT: 45.8 % (ref 39.0–52.0)
HEMOGLOBIN: 15.3 g/dL (ref 13.0–17.0)
MCH: 31.6 pg (ref 26.0–34.0)
MCHC: 33.4 g/dL (ref 30.0–36.0)
MCV: 94.6 fL (ref 78.0–100.0)
Platelets: 147 10*3/uL — ABNORMAL LOW (ref 150–400)
RBC: 4.84 MIL/uL (ref 4.22–5.81)
RDW: 13.2 % (ref 11.5–15.5)
WBC: 7 10*3/uL (ref 4.0–10.5)

## 2017-11-16 LAB — COMPREHENSIVE METABOLIC PANEL
ALBUMIN: 3.4 g/dL — AB (ref 3.5–5.0)
ALT: 62 U/L — ABNORMAL HIGH (ref 0–44)
ANION GAP: 6 (ref 5–15)
AST: 32 U/L (ref 15–41)
Alkaline Phosphatase: 49 U/L (ref 38–126)
BILIRUBIN TOTAL: 0.3 mg/dL (ref 0.3–1.2)
BUN: 12 mg/dL (ref 6–20)
CO2: 26 mmol/L (ref 22–32)
Calcium: 9.6 mg/dL (ref 8.9–10.3)
Chloride: 111 mmol/L (ref 98–111)
Creatinine, Ser: 1.1 mg/dL (ref 0.61–1.24)
GFR calc Af Amer: >60 mL/min (ref >60)
GFR calc non Af Amer: >60 mL/min (ref >60)
GLUCOSE: 87 mg/dL (ref 70–99)
POTASSIUM: 3.7 mmol/L (ref 3.5–5.1)
SODIUM: 143 mmol/L (ref 135–145)
TOTAL PROTEIN: 6.7 g/dL (ref 6.5–8.1)

## 2017-11-16 MED ORDER — HYDROXYZINE HCL 25 MG PO TABS
25.0000 mg | ORAL_TABLET | Freq: Three times a day (TID) | ORAL | Status: DC | PRN
  Administered 2017-11-21: 25 mg via ORAL
  Filled 2017-11-16: qty 1

## 2017-11-16 MED ORDER — ARIPIPRAZOLE 5 MG PO TABS
5.0000 mg | ORAL_TABLET | Freq: Every day | ORAL | Status: DC
  Administered 2017-11-16 – 2017-11-21 (×6): 5 mg via ORAL
  Filled 2017-11-16 (×8): qty 1

## 2017-11-16 MED ORDER — ESCITALOPRAM OXALATE 20 MG PO TABS
20.0000 mg | ORAL_TABLET | Freq: Every day | ORAL | Status: DC
  Administered 2017-11-16: 20 mg via ORAL
  Filled 2017-11-16 (×3): qty 1

## 2017-11-16 MED ORDER — SPIRONOLACTONE 25 MG PO TABS
50.0000 mg | ORAL_TABLET | Freq: Every day | ORAL | Status: DC
  Administered 2017-11-16 – 2017-11-23 (×8): 50 mg via ORAL
  Filled 2017-11-16 (×3): qty 2
  Filled 2017-11-16: qty 1
  Filled 2017-11-16: qty 2
  Filled 2017-11-16 (×2): qty 1
  Filled 2017-11-16: qty 2
  Filled 2017-11-16 (×2): qty 1

## 2017-11-16 MED ORDER — ESTRADIOL 1 MG PO TABS
2.0000 mg | ORAL_TABLET | Freq: Three times a day (TID) | ORAL | Status: DC
  Administered 2017-11-16 – 2017-11-23 (×23): 2 mg via ORAL
  Filled 2017-11-16: qty 1
  Filled 2017-11-16 (×4): qty 2
  Filled 2017-11-16: qty 1
  Filled 2017-11-16: qty 2
  Filled 2017-11-16 (×2): qty 1
  Filled 2017-11-16 (×5): qty 2
  Filled 2017-11-16: qty 1
  Filled 2017-11-16: qty 2
  Filled 2017-11-16: qty 1
  Filled 2017-11-16: qty 2
  Filled 2017-11-16 (×4): qty 1
  Filled 2017-11-16: qty 2
  Filled 2017-11-16 (×3): qty 1
  Filled 2017-11-16 (×2): qty 2

## 2017-11-16 MED ORDER — FINASTERIDE 5 MG PO TABS
2.5000 mg | ORAL_TABLET | Freq: Every day | ORAL | Status: DC
  Administered 2017-11-16 – 2017-11-23 (×8): 2.5 mg via ORAL
  Filled 2017-11-16 (×9): qty 0.5

## 2017-11-16 MED ORDER — MAGNESIUM HYDROXIDE 400 MG/5ML PO SUSP: 30.0000 mL | Freq: Every day | ORAL | Status: DC | PRN

## 2017-11-16 MED ORDER — PRAZOSIN HCL 2 MG PO CAPS
2.0000 mg | ORAL_CAPSULE | Freq: Every day | ORAL | Status: DC
  Filled 2017-11-16 (×3): qty 1

## 2017-11-16 MED ORDER — ALUM & MAG HYDROXIDE-SIMETH 200-200-20 MG/5ML PO SUSP: 30.0000 mL | ORAL | Status: DC | PRN

## 2017-11-16 MED ORDER — NICOTINE 21 MG/24HR TD PT24
21.0000 mg | MEDICATED_PATCH | Freq: Every day | TRANSDERMAL | Status: DC
  Administered 2017-11-16 – 2017-11-23 (×8): 21 mg via TRANSDERMAL
  Filled 2017-11-16 (×9): qty 1

## 2017-11-16 MED ORDER — ESCITALOPRAM OXALATE 20 MG PO TABS
30.0000 mg | ORAL_TABLET | Freq: Every day | ORAL | Status: DC
  Administered 2017-11-17 – 2017-11-23 (×7): 30 mg via ORAL
  Filled 2017-11-16 (×8): qty 1

## 2017-11-16 MED ORDER — ACETAMINOPHEN 325 MG PO TABS: 650.0000 mg | ORAL_TABLET | Freq: Four times a day (QID) | ORAL | Status: DC | PRN

## 2017-11-16 MED ORDER — LEDIPASVIR-SOFOSBUVIR 90-400 MG PO TABS: 1.0000 | ORAL_TABLET | Freq: Every day | ORAL | Status: DC

## 2017-11-16 MED ORDER — TRAZODONE HCL 50 MG PO TABS: 50.0000 mg | ORAL_TABLET | Freq: Every evening | ORAL | Status: DC | PRN

## 2017-11-16 MED ORDER — PRAZOSIN HCL 1 MG PO CAPS
1.0000 mg | ORAL_CAPSULE | Freq: Every day | ORAL | Status: DC
  Administered 2017-11-16 – 2017-11-22 (×6): 1 mg via ORAL
  Filled 2017-11-16 (×8): qty 1

## 2017-11-16 MED ORDER — TRAZODONE HCL 50 MG PO TABS
50.0000 mg | ORAL_TABLET | Freq: Every evening | ORAL | Status: DC | PRN
  Filled 2017-11-16 (×4): qty 1

## 2017-11-16 NOTE — Plan of Care (Signed)
Patient has been isolative room and states, "I just feel so depressed." Patient has not been out of room this evening. Patient was agreeable to setting a goal for attendance at one group tomorrow (at a minimum).

## 2017-11-16 NOTE — Progress Notes (Signed)
Patient ID: Randy [name], adult   DOB: 07-Aug-1970, 47 y.o.   MRN: [id number] D) Pt has been depressed, sad, flat. Pt has been isolative to room this shift despite prompting, including meals. Pt guarded and forwards little. Eye contact minimal. ekg done. A) Leve 3 obs for safety. Support and encouragement provided. Prompting. Med ed reinforced. R) Safety maintained.

## 2017-11-16 NOTE — Tx Team (Signed)
Initial Treatment Plan 11/16/2017 1:56 AM Randy CaulEmily Wandel WUJ:[id number]    PATIENT STRESSORS: Financial difficulties Health problems Legal issue Loss of ex boyfriend Occupational concerns Substance abuse   PATIENT STRENGTHS: Ability for insight Average or above average intelligence Capable of independent living Communication skills General fund of knowledge Motivation for treatment/growth Supportive family/friends Work skills   PATIENT IDENTIFIED PROBLEMS: "I wish cocaine was easier to let go of"  "coping skills for depression/SI/substance use"                   DISCHARGE CRITERIA:  Ability to meet basic life and health needs Adequate post-discharge living arrangements Improved stabilization in mood, thinking, and/or behavior Medical problems require only outpatient monitoring Motivation to continue treatment in a less acute level of care Need for constant or close observation no longer present Reduction of life-threatening or endangering symptoms to within safe limits Safe-care adequate arrangements made Verbal commitment to aftercare and medication compliance  PRELIMINARY DISCHARGE PLAN: Attend aftercare/continuing care group Attend 12-step recovery group Outpatient therapy Return to previous living arrangement Return to previous work or school arrangements  PATIENT/FAMILY INVOLVEMENT: This treatment plan has been presented to and reviewed with the patient, Randy [name].  The patient and family have been given the opportunity to ask questions and make suggestions.  Carlisle CaterErika C Thao Bauza, RN 11/16/2017, 1:56 AM
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
## 2017-11-16 NOTE — Progress Notes (Signed)
Patient given urine cup with instructions. Verbalized understanding.
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
## 2017-11-16 NOTE — Progress Notes (Signed)
Patient ID: Randy [name], adult   DOB: [birth year], 47 y.o.   MRN: [id number] PER STATE REGULATIONS 482.30  THIS CHART WAS REVIEWED FOR MEDICAL NECESSITY WITH RESPECT TO THE PATIENT'S ADMISSION/DURATION OF STAY.  NEXT REVIEW DATE:11/20/17  Loura HaltBARBARA Teirra Carapia, RN, BSN CASE MANAGER
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
## 2017-11-16 NOTE — H&P (Signed)
Behavioral Health Medical Screening Exam  Randy [name] is an 47 y.o. adult.  Total Time spent with patient: 20 minutes  Psychiatric Specialty Exam: Physical Exam  Constitutional: She is oriented to person, place, and time. She appears well-developed and well-nourished. No distress.  HENT:  Head: Normocephalic and atraumatic.  Right Ear: External ear normal.  Left Ear: External ear normal.  Eyes: Conjunctivae are normal. Right eye exhibits no discharge. Left eye exhibits no discharge.  Respiratory: Effort normal. No respiratory distress.  Musculoskeletal: Normal range of motion.  Neurological: She is alert and oriented to person, place, and time.  Skin: Skin is warm and dry. She is not diaphoretic.  Psychiatric: Her mood appears anxious. She is not withdrawn and not actively hallucinating. Thought content is not paranoid and not delusional. Cognition and memory are normal. She expresses impulsivity and inappropriate judgment. She exhibits a depressed mood. She expresses suicidal ideation. She expresses no homicidal ideation. She expresses suicidal plans.    Review of Systems  Constitutional: Negative for chills, fever and weight loss.  Psychiatric/Behavioral: Positive for depression, substance abuse and suicidal ideas. Negative for hallucinations and memory loss. The patient is nervous/anxious and has insomnia.   All other systems reviewed and are negative.   Blood pressure 112/66, pulse 60, temperature 98.4 F (36.9 C), resp. rate 16, SpO2 100 %.There is no height or weight on file to calculate BMI.  General Appearance: Casual and Fairly Groomed  Eye Contact:  Fair  Speech:  Clear and Coherent and Normal Rate  Volume:  Decreased  Mood:  Anxious, Depressed, Dysphoric, Hopeless and Worthless  Affect:  Congruent and Depressed  Thought Process:  Coherent, Goal Directed and Descriptions of Associations: Intact  Orientation:  Full (Time, Place, and Person)  Thought Content:  Logical and  Hallucinations: None  Suicidal Thoughts:  Yes.  with intent/plan  Homicidal Thoughts:  No  Memory:  Immediate;   Fair Recent;   Fair  Judgement:  Impaired  Insight:  Lacking  Psychomotor Activity:  Normal  Concentration: Concentration: Fair and Attention Span: Fair  Recall:  Good  Fund of Knowledge:Good  Language: Good  Akathisia:  No  Handed:  Right  AIMS (if indicated):     Assets:  Communication Skills Desire for Improvement Financial Resources/Insurance Housing Intimacy Leisure Time Physical Health Transportation  Sleep:       Musculoskeletal: Strength & Muscle Tone: within normal limits Gait & Station: normal   Blood pressure 112/66, pulse 60, temperature 98.4 F (36.9 C), resp. rate 16, SpO2 100 %.  Recommendations:  Based on my evaluation the patient does not appear to have an emergency medical condition.  Jackelyn PolingJason A Berry, NP 11/16/2017, 12:37 AM
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
## 2017-11-16 NOTE — [facility] Group Notes (Signed)
LCSW Group Therapy Note 11/16/2017 11:26 AM  Type of Therapy and Topic: Group Therapy: Overcoming Obstacles  Participation Level: Did Not Attend  Description of Group:  In this group patients will be encouraged to explore what they see as obstacles to their own wellness and recovery. They will be guided to discuss their thoughts, feelings, and behaviors related to these obstacles. The group will process together ways to cope with barriers, with attention given to specific choices patients can make. Each patient will be challenged to identify changes they are motivated to make in order to overcome their obstacles. This group will be process-oriented, with patients participating in exploration of their own experiences as well as giving and receiving support and challenge from other group members.  Therapeutic Goals: 1. Patient will identify personal and current obstacles as they relate to admission. 2. Patient will identify barriers that currently interfere with their wellness or overcoming obstacles.  3. Patient will identify feelings, thought process and behaviors related to these barriers. 4. Patient will identify two changes they are willing to make to overcome these obstacles:   Summary of Patient Progress  Invited, chose not to attend.    Therapeutic Modalities:  Cognitive Behavioral Therapy Solution Focused Therapy Motivational Interviewing Relapse Prevention Therapy   Alcario DroughtJolan Liam Cammarata LCSWA Clinical Social Worker

## 2017-11-16 NOTE — Progress Notes (Signed)
D: Patient observed resting in bed all evening. Depressed, sad, withdrawn in affect and mood. Expresses hopelessness and states, "I haven't been up much. I'm just so depressed." Disheveled appearance with noted body odor. Denies pain, physical complaints.   A: Medicated per orders, no prns requested or required. Medication education provided. Level III obs in place for safety. Emotional support offered. Patient encouraged to complete Suicide Safety Plan before discharge. Encouraged to attend and participate in unit programming.    R: Patient verbalizes understanding of POC. Agrees to attend at least one group tomorrow. Patient denies HI/AVH however endorses passive SI. Verbal contract in place for safety and remains safe on level III obs. Will continue to monitor throughout the night.

## 2017-11-17 DIAGNOSIS — F419 Anxiety disorder, unspecified: Secondary | ICD-10-CM

## 2017-11-17 DIAGNOSIS — F1490 Cocaine use, unspecified, uncomplicated: Secondary | ICD-10-CM

## 2017-11-17 DIAGNOSIS — F17210 Nicotine dependence, cigarettes, uncomplicated: Secondary | ICD-10-CM

## 2017-11-17 DIAGNOSIS — G4700 Insomnia, unspecified: Secondary | ICD-10-CM

## 2017-11-17 DIAGNOSIS — Z736 Limitation of activities due to disability: Secondary | ICD-10-CM

## 2017-11-17 DIAGNOSIS — F515 Nightmare disorder: Secondary | ICD-10-CM

## 2017-11-17 LAB — LIPID PANEL
Cholesterol: 152 mg/dL (ref 0–200)
HDL: 38 mg/dL — ABNORMAL LOW (ref >40)
LDL CALC: 97 mg/dL (ref 0–99)
Total CHOL/HDL Ratio: 4 RATIO
Triglycerides: 87 mg/dL (ref <150)
VLDL: 17 mg/dL (ref 0–40)

## 2017-11-17 NOTE — Progress Notes (Signed)
Pt presents with a sad affect and depressed mood. Pt reports ongoing depression 7/10. Pt reports fair sleep at bedtime. Pt denies SI/HI. Pt noted to be guarded, withdrawn and isolative throughout the day. Pt compliant with meds and denies any side effects.  Orders reviewed with pt. Verbal support provided. Pt encouraged to attend groups. 15 minute checks performed for safety.  Pt compliant with tx plan. No concerns verbalized by pt.

## 2017-11-17 NOTE — Plan of Care (Signed)
  Problem: Safety: Goal: Periods of time without injury will increase Outcome: Progressing   Problem: Activity: Goal: Interest or engagement in activities will improve 11/17/2017 1800 by Layla BarterWhite, Markham Dumlao L, RN Outcome: Not Progressing 11/17/2017 1800 by Layla BarterWhite, Gail Creekmore L, RN Outcome: Not Progressing  Pt withdrawn and is isolative to her room. Pt refuses to attend groups when encouraged by staff.

## 2017-11-17 NOTE — Progress Notes (Signed)
[facility] MD Progress Note  11/17/2017 10:54 AM Randy [name]  MRN:  [id number] Subjective:  Reports she continues to feel depressed, sad. Denies current suicidal plan or intention. Remains ruminative about death of loved one earlier this year . Objective : I have discussed case with treatment team and have met with patient. 47 year old transgender male, who presented with worsening depression and suicidal ideations . History of depression, recently worsened after deceased SO's family member established contact with patient to see if she wanted to keep his ashes . Also reports history of cocaine abuse. At this time reports ongoing depression, decreased energy level, and an overall feeling of sadness. Denies suicidal ideations and contracts for safety on unit . Limited milieu interaction, tends to isolate, cooperative on approach. Denies medication side effects.  Today denies suicidal ideations .  Labs reviewed as below.  Principal Problem:  Depression Diagnosis:   Patient Active Problem List   Diagnosis Date Noted  . Severe recurrent major depression without psychotic features ([place]) [F33.2] 11/16/2017  . Liver fibrosis [K74.0] 06/22/2017  . MDD (major depressive disorder), single episode, severe , no psychosis ([place]) [F32.2] 06/11/2017  . Cough [R05] 03/14/2015  . Major depressive disorder, recurrent, severe without psychotic features ([place]) [F33.2]   . Opioid dependence with opioid-induced mood disorder ([place]) [F11.24]   . Cocaine dependence with cocaine-induced mood disorder ([place]) [F14.24]   . MDD (major depressive disorder) [F32.9] 09/17/2014  . MDD (major depressive disorder), recurrent severe, without psychosis ([place]) [F33.2] 07/05/2014  . Major depression, recurrent ([place]) [F33.9] 04/10/2012  . Cocaine dependence ([place]) [F14.20] 04/10/2012  . Polysubstance abuse including IVDA (heroin), cocaine, marijuana [F19.10] 01/10/2012  . Elevated LFTs [R94.5] 01/10/2012  . Chronic hepatitis C without  hepatic coma ([place]) [B18.2] 01/10/2012  . 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 Tobacco abuse [Z72.0] 01/10/2012  . Anxiety disorder [F41.9] 08/14/2011  . Recurrent major depression-severe ([place]) [F33.2] 08/13/2011   Total Time spent with patient: 45 minutes  Past Psychiatric History:   Past Medical History:  Past Medical History:  Diagnosis Date  . Cellulitis  Of  Left Forearm 01/10/2012   From IVDA  . Depression   . Hepatitis C   . 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 I&D EXTREMITY  01/13/2012   Procedure: IRRIGATION AND DEBRIDEMENT EXTREMITY;  Surgeon: Sharmon Revere, MD;  Location: [facility];  Service: Orthopedics;  Laterality: Left;   Family History:  Family History  Problem Relation Age of Onset  . Idiopathic pulmonary fibrosis Father    Family Psychiatric  History:  Social History:  Social History   Substance and Sexual Activity  Alcohol Use Yes   Comment: occ     Social History   Substance and Sexual Activity  Drug Use Yes  . Types: Heroin, "Crack" cocaine, Marijuana   Comment: heroin last used 07/14/14, crack/ had some a few days ago    Social History   Socioeconomic History  . Marital status: Single    Spouse name: Not on file  . Number of children: Not on file  . Years of education: Not on file  . Highest education level: Not on file  Occupational History  . 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 Financial resource strain: Not on file  . Food insecurity:    Worry: Not on file    Inability: Not on file  . Transportation  needs:    Medical: Not on file    Non-medical: Not on file  Tobacco Use  . Smoking status: Current Every Day Smoker    Packs/day: 0.50    Years: 20.00    Pack years: 10.00    Types: Cigarettes  . Smokeless tobacco: Never Used  . Tobacco comment: trying to quit-using e-cig some  Substance and Sexual  Activity  . Alcohol use: Yes    Comment: occ  . Drug use: Yes    Types: Heroin, "Crack" cocaine, Marijuana    Comment: heroin last used 07/14/14, crack/ had some a few days ago  . Sexual activity: Yes    Partners: Male    Birth control/protection: Condom    Comment: Has been HIV tested in past and has been negative.  Lifestyle  . Physical activity:    Days per week: Not on file    Minutes per session: Not on file  . Stress: Not on file  Relationships  . Social connections:    Talks on phone: Not on file    Gets together: Not on file    Attends religious service: Not on file    Active member of club or organization: Not on file    Attends meetings of clubs or organizations: Not on file    Relationship status: Not on file  Other Topics Concern  . Not on file  Social History Narrative   Transgendered male.  Single.  Lives alone.  Independent.   Additional Social History:    Pain Medications: See MAR Prescriptions: See MAR Over the Counter: See MAR History of alcohol / drug use?: Yes Longest period of sobriety (when/how long): 2 years Negative Consequences of Use: Financial, Personal relationships Name of Substance 1: Opiates 1 - Age of First Use: Mid 30's 1 - Amount (size/oz): Unknown 1 - Frequency: Occasionally 1 - Duration: Ongoing 1 - Last Use / Amount: 2 weeks ago Name of Substance 2: Cocaine 2 - Age of First Use: Early 30's 2 - Amount (size/oz): Unknown 2 - Frequency: Daily 2 - Duration: Off and on 2 - Last Use / Amount: yesterday  Sleep: Fair  Appetite:  Fair  Current Medications: Current Facility-Administered Medications  Medication Dose Route Frequency Provider Last Rate Last Dose  . acetaminophen (TYLENOL) tablet 650 mg  650 mg Oral Q6H PRN Lindon Romp A, NP      . alum & mag hydroxide-simeth (MAALOX/MYLANTA) 200-200-20 MG/5ML suspension 30 mL  30 mL Oral Q4H PRN Lindon Romp A, NP      . ARIPiprazole (ABILIFY) tablet 5 mg  5 mg Oral Daily Lindon Romp  A, NP   5 mg at 11/17/17 0800  . escitalopram (LEXAPRO) tablet 30 mg  30 mg Oral Daily Alexius Hangartner, Myer Peer, MD   30 mg at 11/17/17 0800  . estradiol (ESTRACE) tablet 2 mg  2 mg Oral TID Lindon Romp A, NP   2 mg at 11/17/17 0800  . finasteride (PROSCAR) tablet 2.5 mg  2.5 mg Oral Daily Lindon Romp A, NP   2.5 mg at 11/17/17 0800  . hydrOXYzine (ATARAX/VISTARIL) tablet 25 mg  25 mg Oral TID PRN Lindon Romp A, NP      . magnesium hydroxide (MILK OF MAGNESIA) suspension 30 mL  30 mL Oral Daily PRN Lindon Romp A, NP      . nicotine (NICODERM CQ - dosed in mg/24 hours) patch 21 mg  21 mg Transdermal Daily Lindon Romp A, NP   21 mg  at 11/17/17 0803  . prazosin (MINIPRESS) capsule 1 mg  1 mg Oral QHS Marabeth Melland, Myer Peer, MD   1 mg at 11/16/17 2200  . spironolactone (ALDACTONE) tablet 50 mg  50 mg Oral Daily Lindon Romp A, NP   50 mg at 11/17/17 0800  . traZODone (DESYREL) tablet 50 mg  50 mg Oral QHS PRN Rexanna Louthan, Myer Peer, MD        Lab Results:  Results for orders placed or performed during the hospital encounter of 11/16/17 (from the past 48 hour(s))  CBC     Status: Abnormal   Collection Time: 11/16/17  6:18 AM  Result Value Ref Range   WBC 7.0 4.0 - 10.5 K/uL   RBC 4.84 4.22 - 5.81 MIL/uL   Hemoglobin 15.3 13.0 - 17.0 g/dL   HCT 45.8 39.0 - 52.0 %   MCV 94.6 78.0 - 100.0 fL   MCH 31.6 26.0 - 34.0 pg   MCHC 33.4 30.0 - 36.0 g/dL   RDW 13.2 11.5 - 15.5 %   Platelets 147 (L) 150 - 400 K/uL    Comment: Performed at [facility], [place] [address], [place], [place] [address]  Comprehensive metabolic panel     Status: Abnormal   Collection Time: 11/16/17  6:18 AM  Result Value Ref Range   Sodium 143 135 - 145 mmol/L   Potassium 3.7 3.5 - 5.1 mmol/L   Chloride 111 98 - 111 mmol/L    Comment: Please note change in reference range.   CO2 26 22 - 32 mmol/L   Glucose, Bld 87 70 - 99 mg/dL    Comment: Please note change in reference range.   BUN 12 6 - 20 mg/dL    Comment:  Please note change in reference range.   Creatinine, Ser 1.10 0.61 - 1.24 mg/dL   Calcium 9.6 8.9 - 10.3 mg/dL   Total Protein 6.7 6.5 - 8.1 g/dL   Albumin 3.4 (L) 3.5 - 5.0 g/dL   AST 32 15 - 41 U/L   ALT 62 (H) 0 - 44 U/L    Comment: Please note change in reference range.   Alkaline Phosphatase 49 38 - 126 U/L   Total Bilirubin 0.3 0.3 - 1.2 mg/dL   GFR calc non Af Amer >60 >60 mL/min   GFR calc Af Amer >60 >60 mL/min    Comment: (NOTE) The eGFR has been calculated using the CKD EPI equation. This calculation has not been validated in all clinical situations. eGFR's persistently <60 mL/min signify possible Chronic Kidney Disease.    Anion gap 6 5 - 15    Comment: Performed at [facility], [place] [address] [place], [place] [address]  Lipid panel     Status: Abnormal   Collection Time: 11/17/17  7:02 AM  Result Value Ref Range   Cholesterol 152 0 - 200 mg/dL   Triglycerides 87 <150 mg/dL   HDL 38 (L) >40 mg/dL   Total CHOL/HDL Ratio 4.0 RATIO   VLDL 17 0 - 40 mg/dL   LDL Cholesterol 97 0 - 99 mg/dL    Comment:        Total Cholesterol/HDL:CHD Risk Coronary Heart Disease Risk Table                     Men   Women  1/2 Average Risk   3.4   3.3  Average Risk       5.0   4.4  2 X Average Risk   9.6   7.1  3 X Average Risk  23.4   11.0        Use the calculated Patient Ratio above and the CHD Risk Table to determine the patient's CHD Risk.        ATP III CLASSIFICATION (LDL):  <100     mg/dL   Optimal  100-129  mg/dL   Near or Above                    Optimal  130-159  mg/dL   Borderline  160-189  mg/dL   High  >190     mg/dL   Very High Performed at [place] [address], [place], [place] [address]     Blood Alcohol level:  Lab Results  Component Value Date   ETH <10 06/12/2017   ETH <5 17/49/4496    Metabolic Disorder Labs: Lab Results  Component Value Date   HGBA1C 5.3 06/12/2017   MPG 105.41 06/12/2017   MPG  103 09/09/2016   Lab Results  Component Value Date   PROLACTIN 6.3 09/09/2016   Lab Results  Component Value Date   CHOL 152 11/17/2017   TRIG 87 11/17/2017   HDL 38 (L) 11/17/2017   CHOLHDL 4.0 11/17/2017   VLDL 17 11/17/2017   LDLCALC 97 11/17/2017   LDLCALC 91 06/12/2017    Physical Findings: AIMS:  , ,  ,  ,    CIWA:    COWS:     Musculoskeletal: Strength & Muscle Tone: within normal limits Gait & Station: normal Patient leans: N/A  Psychiatric Specialty Exam: Physical Exam  ROS- denies headache, no chest pain, no shortness of breath, no vomiting   Blood pressure 100/67, pulse 63, temperature 98 F (36.7 C), temperature source Oral, resp. rate 16, height '5\' 6"'  (1.676 m), weight 78 kg (172 lb), SpO2 98 %.Body mass index is 27.76 kg/m.  General Appearance: Fairly Groomed  Eye Contact:  Fair  Speech:  Normal Rate  Volume:  Decreased  Mood:  Depressed  Affect:  constricted   Thought Process:  Linear and Descriptions of Associations: Intact  Orientation:  Full (Time, Place, and Person)  Thought Content:  no hallucinations, no delusions, not internally preoccupied   Suicidal Thoughts:  No- at this time denies suicidal or self injurious ideations, denies homicidal or violent ideations, currently is able to contract for safety on unit   Homicidal Thoughts:  No  Memory:  recent and remote grossly intact   Judgement:  Other:  improving   Insight:  improving   Psychomotor Activity:  Normal  Concentration:  Concentration: Good and Attention Span: Good  Recall:  Good  Fund of Knowledge:  Good  Language:  Good  Akathisia:  Negative  Handed:  Right  AIMS (if indicated):     Assets:  Communication Skills Desire for Improvement Resilience  ADL's:  Intact  Cognition:  WNL  Sleep:  Number of Hours: 4.25   Assessment- patient reports persistent depression, sadness, and remains isolative, with limited neuro-vegetative symptoms. At this time denies suicidal ideations,  contracts for safety on unit . Tolerating medications well ( Abilify, Lexapro, which has been  titrated up in dose )   Treatment Plan Summary: Daily contact with patient to assess and evaluate symptoms and progress in treatment, Medication management, Plan inpatient treatment  and medications as below Encourage group and miilieu participation to work on coping skills and symptom reduction  Continue Abilify 5 mgrs QDAY for depression, mood disorder  Continue Lexapro 30 mgrs QDAY for depression Continue Minipress 1 mgr QHS for nightmares  Continue Vistaril 25 mgrs Q 8 hours PRN for anxiety as needed Continue Trazodone 50 mgrs QHS PRN for insomnia  Treatment Team working on disposition Hardy, MD 11/17/2017, 10:54 AM

## 2017-11-17 NOTE — [facility] Group Notes (Signed)
[facility] 11/17/2017 1:15pm  Type of Therapy: Mental Health Association Presentation  Participation Level: Invited. Chose to remain in bed.    Rona RavensHeather S Breelyn Icard, LCSW 11/17/2017 2:45 PM

## 2017-11-17 NOTE — Tx Team (Signed)
Interdisciplinary Treatment and Diagnostic Plan Update  11/17/2017 Time of Session: 1056 Randy [name] MRN: [id number]  Principal Diagnosis: <principal problem not specified>  Secondary Diagnoses: Active Problems:   Severe recurrent major depression without psychotic features (HCC)   Current Medications:  Current Facility-Administered Medications  Medication Dose Route Frequency Provider Last Rate Last Dose  . acetaminophen (TYLENOL) tablet 650 mg  650 mg Oral Q6H PRN Nira ConnBerry, Jason A, NP      . alum & mag hydroxide-simeth (MAALOX/MYLANTA) 200-200-20 MG/5ML suspension 30 mL  30 mL Oral Q4H PRN Nira ConnBerry, Jason A, NP      . ARIPiprazole (ABILIFY) tablet 5 mg  5 mg Oral Daily Nira ConnBerry, Jason A, NP   5 mg at 11/17/17 0800  . escitalopram (LEXAPRO) tablet 30 mg  30 mg Oral Daily Cobos, Rockey SituFernando A, MD   30 mg at 11/17/17 0800  . estradiol (ESTRACE) tablet 2 mg  2 mg Oral TID Nira ConnBerry, Jason A, NP   2 mg at 11/17/17 1137  . finasteride (PROSCAR) tablet 2.5 mg  2.5 mg Oral Daily Nira ConnBerry, Jason A, NP   2.5 mg at 11/17/17 0800  . hydrOXYzine (ATARAX/VISTARIL) tablet 25 mg  25 mg Oral TID PRN Nira ConnBerry, Jason A, NP      . magnesium hydroxide (MILK OF MAGNESIA) suspension 30 mL  30 mL Oral Daily PRN Nira ConnBerry, Jason A, NP      . nicotine (NICODERM CQ - dosed in mg/24 hours) patch 21 mg  21 mg Transdermal Daily Nira ConnBerry, Jason A, NP   21 mg at 11/17/17 0803  . prazosin (MINIPRESS) capsule 1 mg  1 mg Oral QHS Cobos, Rockey SituFernando A, MD   1 mg at 11/16/17 2200  . spironolactone (ALDACTONE) tablet 50 mg  50 mg Oral Daily Nira ConnBerry, Jason A, NP   50 mg at 11/17/17 0800  . traZODone (DESYREL) tablet 50 mg  50 mg Oral QHS PRN Cobos, Rockey SituFernando A, MD       PTA Medications: Medications Prior to Admission  Medication Sig Dispense Refill Last Dose  . ARIPiprazole (ABILIFY) 5 MG tablet Take 1 tablet (5 mg total) by mouth daily. For mood control 30 tablet 0 unknown  . escitalopram (LEXAPRO) 20 MG tablet Take 1 tablet (20 mg total) by mouth daily.  For depression 30 tablet 0 unknown  . estradiol (ESTRACE) 2 MG tablet Take 1 tablet (2 mg total) by mouth 3 (three) times daily. For hormonal supplementation 90 tablet 0 unknown  . finasteride (PROSCAR) 5 MG tablet Take 0.5 tablets (2.5 mg total) by mouth daily. For hormonal treatment for male transgender process: Take 1/2 tablet 1 tablet 0 unknown  . prazosin (MINIPRESS) 2 MG capsule Take 1 capsule (2 mg total) by mouth at bedtime. For nightmares 30 capsule 0 unknown  . spironolactone (ALDACTONE) 50 MG tablet Take 1 tablet (50 mg total) by mouth daily. Part treatment for transgender process.   unknown  . hydrOXYzine (ATARAX/VISTARIL) 25 MG tablet Take 1 tablet (25 mg total) by mouth every 6 (six) hours as needed for anxiety. (Patient not taking: Reported on 06/21/2017) 60 tablet 0 Not Taking at Unknown time  . Ledipasvir-Sofosbuvir (HARVONI) 90-400 MG TABS Take 1 tablet by mouth daily. (Patient not taking: Reported on 11/16/2017) 28 tablet 2 Not Taking at Unknown time  . nicotine (NICODERM CQ - DOSED IN MG/24 HOURS) 21 mg/24hr patch Place 1 patch (21 mg total) onto the skin daily. (May purchase from over the counter): For smoking cessation (Patient not taking: Reported  on 06/21/2017) 28 patch 0 Not Taking at Unknown time  . traZODone (DESYREL) 50 MG tablet Take 1 tablet (50 mg total) by mouth at bedtime as needed for sleep. (Patient not taking: Reported on 06/21/2017) 30 tablet 0 Not Taking at Unknown time    Patient Stressors: Financial difficulties Health problems Legal issue Loss of ex boyfriend Occupational concerns Substance abuse  Patient Strengths: Ability for insight Average or above average intelligence Capable of independent living Communication skills General fund of knowledge Motivation for treatment/growth Supportive family/friends Work skills  Treatment Modalities: Medication Management, Group therapy, Case management,  1 to 1 session with clinician, Psychoeducation,  Recreational therapy.   Physician Treatment Plan for Primary Diagnosis: <principal problem not specified> Long Term Goal(s): Improvement in symptoms so as ready for discharge Improvement in symptoms so as ready for discharge   Short Term Goals: Ability to identify changes in lifestyle to reduce recurrence of condition will improve Ability to maintain clinical measurements within normal limits will improve Ability to identify triggers associated with substance abuse/mental health issues will improve  Medication Management: Evaluate patient's response, side effects, and tolerance of medication regimen.  Therapeutic Interventions: 1 to 1 sessions, Unit Group sessions and Medication administration.  Evaluation of Outcomes: Progressing  Physician Treatment Plan for Secondary Diagnosis: Active Problems:   Severe recurrent major depression without psychotic features (HCC)  Long Term Goal(s): Improvement in symptoms so as ready for discharge Improvement in symptoms so as ready for discharge   Short Term Goals: Ability to identify changes in lifestyle to reduce recurrence of condition will improve Ability to maintain clinical measurements within normal limits will improve Ability to identify triggers associated with substance abuse/mental health issues will improve     Medication Management: Evaluate patient's response, side effects, and tolerance of medication regimen.  Therapeutic Interventions: 1 to 1 sessions, Unit Group sessions and Medication administration.  Evaluation of Outcomes: Progressing   RN Treatment Plan for Primary Diagnosis: <principal problem not specified> Long Term Goal(s): Knowledge of disease and therapeutic regimen to maintain health will improve  Short Term Goals: Ability to identify and develop effective coping behaviors will improve and Compliance with prescribed medications will improve  Medication Management: RN will administer medications as ordered by  provider, will assess and evaluate patient's response and provide education to patient for prescribed medication. RN will report any adverse and/or side effects to prescribing provider.  Therapeutic Interventions: 1 on 1 counseling sessions, Psychoeducation, Medication administration, Evaluate responses to treatment, Monitor vital signs and CBGs as ordered, Perform/monitor CIWA, COWS, AIMS and Fall Risk screenings as ordered, Perform wound care treatments as ordered.  Evaluation of Outcomes: Progressing   LCSW Treatment Plan for Primary Diagnosis: <principal problem not specified> Long Term Goal(s): Safe transition to appropriate next level of care at discharge, Engage patient in therapeutic group addressing interpersonal concerns.  Short Term Goals: Engage patient in aftercare planning with referrals and resources, Increase social support and Increase skills for wellness and recovery  Therapeutic Interventions: Assess for all discharge needs, 1 to 1 time with Social worker, Explore available resources and support systems, Assess for adequacy in community support network, Educate family and significant other(s) on suicide prevention, Complete Psychosocial Assessment, Interpersonal group therapy.  Evaluation of Outcomes: Progressing   Progress in Treatment: Attending groups: No. Participating in groups: No. Taking medication as prescribed: Yes. Toleration medication: Yes. Family/Significant other contact made: No, will contact:  pt refused consent Patient understands diagnosis: Yes. Discussing patient identified problems/goals with staff: Yes. Medical  problems stabilized or resolved: Yes. Denies suicidal/homicidal ideation: Yes. Issues/concerns per patient self-inventory: No. Other: none  New problem(s) identified: No, Describe:  none  New Short Term/Long Term Goal(s):  Patient Goals:   "To feel better."  Discharge Plan or Barriers:   Reason for Continuation of Hospitalization:  Depression Medication stabilization  Estimated Length of Stay: 2-4 days.  Attendees: Patient:Randy [name] 11/17/2017   Physician: Dr. Jama Flavors, MD 11/17/2017   Nursing: Liborio Nixon, RN 11/17/2017   RN Care Manager: 11/17/2017   Social Worker: Daleen Squibb, LCSW 11/17/2017   Recreational Therapist:  11/17/2017   Other:  11/17/2017   Other:  11/17/2017   Other: 11/17/2017     Scribe for Treatment Team: Lorri Frederick, LCSW 11/17/2017 1:07 PM

## 2017-11-17 NOTE — Progress Notes (Signed)
Adult Psychoeducational Group Note  Date:  11/17/2017 Time:  8:42 PM  Group Topic/Focus:  Wrap-Up Group:   The focus of this group is to help patients review their daily goal of treatment and discuss progress on daily workbooks.  Participation Level:  Minimal  Participation Quality:  Appropriate  Affect:  Appropriate  Cognitive:  Alert  Insight: Limited  Engagement in Group:  Engaged  Modes of Intervention:  Discussion and Support  Additional Comments:  Pt was active in group. Pt goal for the day was to attend some of the groups. Pt said she did well with her goal. Pt said a positive thing that happened today was that she started feeling physically and emotionally better. Pt goal for tomorrow is to attend all groups.   Randy FreshwaterChristina Anajah [name] 11/17/2017, 8:42 PM

## 2017-11-17 NOTE — Therapy (Signed)
Occupational Therapy Group Note  Date:  11/17/2017 Time:  2:59 PM  Group Topic/Focus:  Stress Management  Participation Level:  Minimal  Participation Quality:  Attentive  Affect:  Depressed and Flat  Cognitive:  Appropriate  Insight: Improving  Engagement in Group:  Limited  Modes of Intervention:  Activity, Discussion, Education and Socialization  Additional Comments:    S: Pt did not give subjective response this date  O: Stress management group completed to use as productive coping strategy, to help mitigate maladaptive coping to integrate in functional BADL/IADL when reintegrating into community.  Stress management tools worksheet completed to identify negative coping mechanisms and their short and long term effects vs positive coping mechanisms with demonstration. Coping strategies taught include: relaxation based- deep breathing, counting to 10, taking a 1 minute vacation, acceptance, stress balls, relaxation audio/video, visual/mental imagery. Positive mental attitude- gratitude, acceptance, cognitive reframing, positive self talk, anger management. Gratitude journaling handout and instruction also given. Adult coloring and relaxation tips worksheet given at end of session.   A: Pt presents to group with flat and depressed affect, minimally engaged with conversation this date. Stress management tools worksheet completed, pt nodding head and body language suggests following closely. Asked pt to give thoughts/opinions, pt smiled and pleasantly deferred. Eager to accept handouts this date.   P: Pt provided with education on stress management activities to implement into daily routine. Handouts given to facilitate carryover when reintegrating into community    [facility] IncKaylee Coreyon [name], [place] YorkMSOT, OTR/L  AvnetKaylee Oluwateniola [name] 11/17/2017, 2:59 PM

## 2017-11-17 NOTE — Progress Notes (Signed)
D: Patient denies SI, HI or AVH this evening. Patient has a depressed mood and flat affect.  She is laying in bed on approach after evening wrap up group.  She states that her day was "ok, better than yesterday".  She denies any physical complaints and report that sleep and appetite are good.     A: Patient given emotional support from RN. Patient encouraged to come to staff with concerns and/or questions. Patient's medication routine continued. Patient's orders and plan of care reviewed.   R: Patient remains appropriate and cooperative. Will continue to monitor patient q15 minutes for safety.

## 2017-11-17 NOTE — Progress Notes (Signed)
Recreation Therapy Notes  Date: 7.10.19 Time: 0930 Location: [address] Dayroom  Group Topic: Stress Management  Goal Area(s) Addresses:  Patient will verbalize importance of using healthy stress management.  Patient will identify positive emotions associated with healthy stress management.   Intervention: Stress Management  Activity :  Guided Imagery.  LRT introduced the stress management technique of guided imagery.  LRT read a script to allow patients to visualize being outside on a bright summer day.  Patients were to follow along as script was read.  Education:  Stress Management, Discharge Planning.   Education Outcome: Acknowledges edcuation/In group clarification offered/Needs additional education  Clinical Observations/Feedback: Pt did not attend group.    Saber Dickerman, LRT/CTRS         Keoni Havey A 11/17/2017 12:00 PM 

## 2017-11-18 LAB — RAPID URINE DRUG SCREEN, HOSP PERFORMED
AMPHETAMINES: NOT DETECTED
BENZODIAZEPINES: NOT DETECTED
COCAINE: POSITIVE — AB
Opiates: NOT DETECTED
Tetrahydrocannabinol: NOT DETECTED

## 2017-11-18 LAB — HEMOGLOBIN A1C
HEMOGLOBIN A1C: 5.4 % (ref 4.8–5.6)
Mean Plasma Glucose: 108 mg/dL

## 2017-11-18 LAB — PROLACTIN: Prolactin: 14.2 ng/mL (ref 4.0–15.2)

## 2017-11-18 NOTE — [facility] Group Notes (Signed)
[facility] Group Notes:  (Nursing/MHT/Case Management/Adjunct)  Date:  11/18/2017  Time:  5:05 PM  Type of Therapy:  Psychoeducational Skills  Participation Level:  Active  Participation Quality:  Appropriate and Attentive  Affect:  Appropriate  Cognitive:  Alert and Appropriate  Insight:  Appropriate and Good  Engagement in Group:  Engaged  Modes of Intervention:  Activity and Support Summary of Progress/Problems: Patient actively participated and was receptive.  Randy [name] 11/18/2017, 5:05 PM

## 2017-11-18 NOTE — [facility] Group Notes (Signed)
[facility] LCSW Group Therapy Note  Date/Time: 11/18/17, 1315  Type of Therapy/Topic:  Group Therapy:  Balance in Life  Participation Level:  Did not attend  Description of Group:    This group will address the concept of balance and how it feels and looks when one is unbalanced. Patients will be encouraged to process areas in their lives that are out of balance, and identify reasons for remaining unbalanced. Facilitators will guide patients utilizing problem- solving interventions to address and correct the stressor making their life unbalanced. Understanding and applying boundaries will be explored and addressed for obtaining  and maintaining a balanced life. Patients will be encouraged to explore ways to assertively make their unbalanced needs known to significant others in their lives, using other group members and facilitator for support and feedback.  Therapeutic Goals: 1. Patient will identify two or more emotions or situations they have that consume much of in their lives. 2. Patient will identify signs/triggers that life has become out of balance:  3. Patient will identify two ways to set boundaries in order to achieve balance in their lives:  4. Patient will demonstrate ability to communicate their needs through discussion and/or role plays  Summary of Patient Progress:          Therapeutic Modalities:   Cognitive Behavioral Therapy Solution-Focused Therapy Assertiveness Training  Greg Miku Udall, LCSW 

## 2017-11-18 NOTE — Plan of Care (Signed)
  Problem: Education: Goal: Verbalization of understanding the information provided will improve Outcome: Progressing   Problem: Coping: Goal: Ability to verbalize frustrations and anger appropriately will improve Outcome: Progressing   Problem: Safety: Goal: Periods of time without injury will increase Outcome: Progressing   

## 2017-11-18 NOTE — Progress Notes (Signed)
[facility] MD Progress Note  11/18/2017 1:21 PM Randy [name]  MRN:  [id number] Subjective:  reports she continues to feel depressed but does state that today she is feeling "a little better" and states that she feels medications are "starting to work a little better".  Denies suicidal ideations . Objective : I have discussed case with treatment team and have met with patient. 47 year old transgender male, who presented with worsening depression and suicidal ideations . History of depression, recently worsened after deceased SO's family member established contact with patient to see if she wanted to keep his ashes . Also reports history of cocaine abuse. Continues to present depressed but as above describes some partial improvement and affect is noted to be more reactive, smiles at times during session, speech improved, less soft.  Denies suicidal ideations, contracts for safety on unit .  Staff reports patient continues to present with  depressed mood and flat affect. Tolerating medications well.  He is less isolative, states she is " trying to go to more groups today" Principal Problem:  Depression Diagnosis:   Patient Active Problem List   Diagnosis Date Noted  . Severe recurrent major depression without psychotic features ([place]) [F33.2] 11/16/2017  . Liver fibrosis [K74.0] 06/22/2017  . MDD (major depressive disorder), single episode, severe , no psychosis ([place]) [F32.2] 06/11/2017  . Cough [R05] 03/14/2015  . Major depressive disorder, recurrent, severe without psychotic features ([place]) [F33.2]   . Opioid dependence with opioid-induced mood disorder ([place]) [F11.24]   . Cocaine dependence with cocaine-induced mood disorder ([place]) [F14.24]   . MDD (major depressive disorder) [F32.9] 09/17/2014  . MDD (major depressive disorder), recurrent severe, without psychosis ([place]) [F33.2] 07/05/2014  . Major depression, recurrent ([place]) [F33.9] 04/10/2012  . Cocaine dependence ([place]) [F14.20] 04/10/2012  .  Polysubstance abuse including IVDA (heroin), cocaine, marijuana [F19.10] 01/10/2012  . Elevated LFTs [R94.5] 01/10/2012  . Chronic hepatitis C without hepatic coma ([place]) [B18.2] 01/10/2012  . 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 Tobacco abuse [Z72.0] 01/10/2012  . Anxiety disorder [F41.9] 08/14/2011  . Recurrent major depression-severe ([place]) [F33.2] 08/13/2011   Total Time spent with patient: 20 minutes  Past Psychiatric History:   Past Medical History:  Past Medical History:  Diagnosis Date  . Cellulitis  Of  Left Forearm 01/10/2012   From IVDA  . Depression   . Hepatitis C   . 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 I&D EXTREMITY  01/13/2012   Procedure: IRRIGATION AND DEBRIDEMENT EXTREMITY;  Surgeon: Sharmon Revere, MD;  Location: [facility] ORS;  Service: Orthopedics;  Laterality: Left;   Family History:  Family History  Problem Relation Age of Onset  . Idiopathic pulmonary fibrosis Father    Family Psychiatric  History:  Social History:  Social History   Substance and Sexual Activity  Alcohol Use Yes   Comment: occ     Social History   Substance and Sexual Activity  Drug Use Yes  . Types: Heroin, "Crack" cocaine, Marijuana   Comment: heroin last used 07/14/14, crack/ had some a few days ago    Social History   Socioeconomic History  . Marital status: Single    Spouse name: Not on file  . Number of children: Not on file  . Years of education: Not on file  . Highest education level: Not on file  Occupational History  . 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 Financial resource strain: Not on file  . Food insecurity:    Worry: Not on file    Inability: Not on file  . Transportation needs:    Medical: Not on file    Non-medical: Not on file  Tobacco Use  . Smoking status: Current Every Day Smoker    Packs/day: 0.50    Years: 20.00     Pack years: 10.00    Types: Cigarettes  . Smokeless tobacco: Never Used  . Tobacco comment: trying to quit-using e-cig some  Substance and Sexual Activity  . Alcohol use: Yes    Comment: occ  . Drug use: Yes    Types: Heroin, "Crack" cocaine, Marijuana    Comment: heroin last used 07/14/14, crack/ had some a few days ago  . Sexual activity: Yes    Partners: Male    Birth control/protection: Condom    Comment: Has been HIV tested in past and has been negative.  Lifestyle  . Physical activity:    Days per week: Not on file    Minutes per session: Not on file  . Stress: Not on file  Relationships  . Social connections:    Talks on phone: Not on file    Gets together: Not on file    Attends religious service: Not on file    Active member of club or organization: Not on file    Attends meetings of clubs or organizations: Not on file    Relationship status: Not on file  Other Topics Concern  . Not on file  Social History Narrative   Transgendered male.  Single.  Lives alone.  Independent.   Additional Social History:    Pain Medications: See MAR Prescriptions: See MAR Over the Counter: See MAR History of alcohol / drug use?: Yes Longest period of sobriety (when/how long): 2 years Negative Consequences of Use: Financial, Personal relationships Name of Substance 1: Opiates 1 - Age of First Use: Mid 30's 1 - Amount (size/oz): Unknown 1 - Frequency: Occasionally 1 - Duration: Ongoing 1 - Last Use / Amount: 2 weeks ago Name of Substance 2: Cocaine 2 - Age of First Use: Early 30's 2 - Amount (size/oz): Unknown 2 - Frequency: Daily 2 - Duration: Off and on 2 - Last Use / Amount: yesterday  Sleep: Improving  Appetite:  Fair  Current Medications: Current Facility-Administered Medications  Medication Dose Route Frequency Provider Last Rate Last Dose  . acetaminophen (TYLENOL) tablet 650 mg  650 mg Oral Q6H PRN Lindon Romp A, NP      . alum & mag hydroxide-simeth  (MAALOX/MYLANTA) 200-200-20 MG/5ML suspension 30 mL  30 mL Oral Q4H PRN Lindon Romp A, NP      . ARIPiprazole (ABILIFY) tablet 5 mg  5 mg Oral Daily Lindon Romp A, NP   5 mg at 11/18/17 0807  . escitalopram (LEXAPRO) tablet 30 mg  30 mg Oral Daily Cobos, Myer Peer, MD   30 mg at 11/18/17 0807  . estradiol (ESTRACE) tablet 2 mg  2 mg Oral TID Lindon Romp A, NP   2 mg at 11/18/17 1155  . finasteride (PROSCAR) tablet 2.5 mg  2.5 mg Oral Daily Lindon Romp A, NP   2.5 mg at 11/18/17 7829  . hydrOXYzine (ATARAX/VISTARIL) tablet 25 mg  25 mg Oral TID PRN Lindon Romp A, NP      . magnesium hydroxide (MILK OF MAGNESIA) suspension 30 mL  30 mL Oral Daily PRN Rozetta Nunnery,  NP      . nicotine (NICODERM CQ - dosed in mg/24 hours) patch 21 mg  21 mg Transdermal Daily Lindon Romp A, NP   21 mg at 11/18/17 3016  . prazosin (MINIPRESS) capsule 1 mg  1 mg Oral QHS Cobos, Myer Peer, MD   1 mg at 11/16/17 2200  . spironolactone (ALDACTONE) tablet 50 mg  50 mg Oral Daily Lindon Romp A, NP   50 mg at 11/18/17 0807  . traZODone (DESYREL) tablet 50 mg  50 mg Oral QHS PRN Cobos, Myer Peer, MD        Lab Results:  Results for orders placed or performed during the hospital encounter of 11/16/17 (from the past 48 hour(s))  Hemoglobin A1c     Status: None   Collection Time: 11/17/17  7:02 AM  Result Value Ref Range   Hgb A1c MFr Bld 5.4 4.8 - 5.6 %    Comment: (NOTE)         Prediabetes: 5.7 - 6.4         Diabetes: >6.4         Glycemic control for adults with diabetes: <7.0    Mean Plasma Glucose 108 mg/dL    Comment: (NOTE) Performed At: [facility] [place], [place] [id number] Rush Farmer MD [id number]   Lipid panel     Status: Abnormal   Collection Time: 11/17/17  7:02 AM  Result Value Ref Range   Cholesterol 152 0 - 200 mg/dL   Triglycerides 87 <150 mg/dL   HDL 38 (L) >40 mg/dL   Total CHOL/HDL Ratio 4.0 RATIO   VLDL 17 0 - 40 mg/dL   LDL Cholesterol 97 0 - 99  mg/dL    Comment:        Total Cholesterol/HDL:CHD Risk Coronary Heart Disease Risk Table                     Men   Women  1/2 Average Risk   3.4   3.3  Average Risk       5.0   4.4  2 X Average Risk   9.6   7.1  3 X Average Risk  23.4   11.0        Use the calculated Patient Ratio above and the CHD Risk Table to determine the patient's CHD Risk.        ATP III CLASSIFICATION (LDL):  <100     mg/dL   Optimal  100-129  mg/dL   Near or Above                    Optimal  130-159  mg/dL   Borderline  160-189  mg/dL   High  >190     mg/dL   Very High Performed at [facility] [address] [place], [place] [address]   Prolactin     Status: None   Collection Time: 11/17/17  7:02 AM  Result Value Ref Range   Prolactin 14.2 4.0 - 15.2 ng/mL    Comment: (NOTE) Performed At: [facility] [place], [place] [id number] Rush Farmer MD [id number]     Blood Alcohol level:  Lab Results  Component Value Date   [facility] <10 06/12/2017   ETH <5 54/62/7035    Metabolic Disorder Labs: Lab Results  Component Value Date   HGBA1C 5.4 11/17/2017   MPG 108 11/17/2017   MPG 105.41 06/12/2017   Lab Results  Component Value Date   PROLACTIN 14.2 11/17/2017   PROLACTIN 6.3 09/09/2016   Lab Results  Component Value Date   CHOL 152 11/17/2017   TRIG 87 11/17/2017   HDL 38 (L) 11/17/2017   CHOLHDL 4.0 11/17/2017   VLDL 17 11/17/2017   LDLCALC 97 11/17/2017   LDLCALC 91 06/12/2017    Physical Findings: AIMS:  , ,  ,  ,    CIWA:    COWS:     Musculoskeletal: Strength & Muscle Tone: within normal limits Gait & Station: normal Patient leans: N/A  Psychiatric Specialty Exam: Physical Exam  ROS- denies headache, no chest pain, no shortness of breath, no vomiting   Blood pressure 101/64, pulse 88, temperature 98 F (36.7 C), temperature source Oral, resp. rate 16, height '5\' 6"'  (1.676 m), weight 78 kg (172 lb), SpO2 98 %.Body mass index  is 27.76 kg/m.  General Appearance: Fairly Groomed  Eye Contact:  Improving eye contact  Speech:  Normal Rate  Volume:  Improving  Mood:  Depressed  Affect:  constricted But somewhat more reactive today, smiles at times appropriately,   Thought Process:  Linear and Descriptions of Associations: Intact  Orientation:  Full (Time, Place, and Person)  Thought Content:  no hallucinations, no delusions, not internally preoccupied   Suicidal Thoughts:  No- at this time denies suicidal or self injurious ideations, denies homicidal or violent ideations, currently is able to contract for safety on unit   Homicidal Thoughts:  No  Memory:  recent and remote grossly intact   Judgement:  Other:  improving   Insight:  improving   Psychomotor Activity:  Normal  Concentration:  Concentration: Good and Attention Span: Good  Recall:  Good  Fund of Knowledge:  Good  Language:  Good  Akathisia:  Negative  Handed:  Right  AIMS (if indicated):     Assets:  Communication Skills Desire for Improvement Resilience  ADL's:  Intact  Cognition:  WNL  Sleep:  Number of Hours: 4.25   Assessment-patient remains significantly depressed but presents with partial improvement, noted in a slightly more reactive affect and improved rate and volume of speech.  She also states she is starting to feel better, and expresses optimism that medications are helping.  She is tolerating medications well/ denies suicidal ideations.  Treatment Plan Summary: Daily contact with patient to assess and evaluate symptoms and progress in treatment, Medication management, Plan inpatient treatment  and medications as below  Treatment plan reviewed as below today July 11 Encourage group and miilieu participation to work on Radiographer, therapeutic and symptom reduction Continue Abilify 5 mgrs QDAY for depression, mood disorder  Continue Lexapro 30 mgrs QDAY for depression Continue Minipress 1 mgr QHS for nightmares  Continue Vistaril 25 mgrs Q 8  hours PRN for anxiety as needed Continue Trazodone 50 mgrs QHS PRN for insomnia  Treatment Team working on disposition planning  Jenne Campus, MD 11/18/2017, 1:21 PM   Patient ID: Randy [name], adult   DOB: 1970/12/09, 47 y.o.   MRN: [id number]

## 2017-11-18 NOTE — Progress Notes (Signed)
Pt presents with a flat affect and depressed mood. Pt appears guarded and withdrawn throughout the day, with minimal interaction on the unit. Pt rated on her self inventory sheet: depression 7/10, hopelessness 7/10, anxiety 4/10, sleep-poor and appetite fair. Pt denies SI. Writer discussed discharge plans with pt. Pt expressed that she's unsure of what her discharge plan is. Writer suggested pt speak with the peer support specialist in regards to peer support groups. The peer support specialist met with pt this morning to discuss mental health needs and groups in the community.  Orders reviewed with pt. Verbal support provided. Pt encouraged to attend groups. 15 minute checks performed for safety.   Pt compliant with tx plan.

## 2017-11-19 NOTE — Plan of Care (Signed)
  Problem: Education: Goal: Knowledge of [place] [facility] Education information/materials will improve Outcome: Progressing Goal: Emotional status will improve Outcome: Progressing Goal: Mental status will improve Outcome: Progressing Goal: Verbalization of understanding the information provided will improve Outcome: Progressing   

## 2017-11-19 NOTE — Progress Notes (Signed)
[facility] MD Progress Note  11/19/2017 11:57 AM Randy [name]  MRN:  [id number] Subjective: Describes partially improved mood.  Today seems more future oriented and spoke about possible disposition plans, expressing interest in going to a rehab at discharge.  Objective : I have discussed case with treatment team and have met with patient. 47 year old transgender male, who presented with worsening depression and suicidal ideations . History of depression, recently worsened after deceased SO's family member established contact with patient to see if she wanted to keep his ashes . History of cocaine abuse. Currently presents less severely depressed with a gradually improving range of affect (still constricted but improves during session, smiles at times appropriately), rate of speech has also improved as has her eye contact. She remains isolative but to a lesser degree and has been noted to spend more time in day room-interaction with peers remains limited. She denies medication side effects and feels that recent Lexapro dose titration has been helpful and well-tolerated thus far. Describes a limited support network, interested in going to a rehab setting at discharge in order to focus on sobriety. Denies suicidal ideations at this time.  Principal Problem:  Depression Diagnosis:   Patient Active Problem List   Diagnosis Date Noted  . Severe recurrent major depression without psychotic features ([place]) [F33.2] 11/16/2017  . Liver fibrosis [K74.0] 06/22/2017  . MDD (major depressive disorder), single episode, severe , no psychosis ([place]) [F32.2] 06/11/2017  . Cough [R05] 03/14/2015  . Major depressive disorder, recurrent, severe without psychotic features ([place]) [F33.2]   . Opioid dependence with opioid-induced mood disorder ([place]) [F11.24]   . Cocaine dependence with cocaine-induced mood disorder ([place]) [F14.24]   . MDD (major depressive disorder) [F32.9] 09/17/2014  . MDD (major depressive disorder),  recurrent severe, without psychosis ([place]) [F33.2] 07/05/2014  . Major depression, recurrent ([place]) [F33.9] 04/10/2012  . Cocaine dependence ([place]) [F14.20] 04/10/2012  . Polysubstance abuse including IVDA (heroin), cocaine, marijuana [F19.10] 01/10/2012  . Elevated LFTs [R94.5] 01/10/2012  . Chronic hepatitis C without hepatic coma ([place]) [B18.2] 01/10/2012  . 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 Tobacco abuse [Z72.0] 01/10/2012  . Anxiety disorder [F41.9] 08/14/2011  . Recurrent major depression-severe ([place]) [F33.2] 08/13/2011   Total Time spent with patient: 20 minutes  Past Psychiatric History:   Past Medical History:  Past Medical History:  Diagnosis Date  . Cellulitis  Of  Left Forearm 01/10/2012   From IVDA  . Depression   . Hepatitis C   . 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 I&D EXTREMITY  01/13/2012   Procedure: IRRIGATION AND DEBRIDEMENT EXTREMITY;  Surgeon: Sharmon Revere, MD;  Location: [facility] ORS;  Service: Orthopedics;  Laterality: Left;   Family History:  Family History  Problem Relation Age of Onset  . Idiopathic pulmonary fibrosis Father    Family Psychiatric  History:  Social History:  Social History   Substance and Sexual Activity  Alcohol Use Yes   Comment: occ     Social History   Substance and Sexual Activity  Drug Use Yes  . Types: Heroin, "Crack" cocaine, Marijuana   Comment: heroin last used 07/14/14, crack/ had some a few days ago    Social History   Socioeconomic History  . Marital status: Single    Spouse name: Not on file  . Number of children: Not on file  . Years of education: Not on file  .  Highest education level: Not on file  Occupational History  . 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 Financial resource strain: Not on file  . Food insecurity:    Worry: Not on file    Inability: Not on file  .  Transportation needs:    Medical: Not on file    Non-medical: Not on file  Tobacco Use  . Smoking status: Current Every Day Smoker    Packs/day: 0.50    Years: 20.00    Pack years: 10.00    Types: Cigarettes  . Smokeless tobacco: Never Used  . Tobacco comment: trying to quit-using e-cig some  Substance and Sexual Activity  . Alcohol use: Yes    Comment: occ  . Drug use: Yes    Types: Heroin, "Crack" cocaine, Marijuana    Comment: heroin last used 07/14/14, crack/ had some a few days ago  . Sexual activity: Yes    Partners: Male    Birth control/protection: Condom    Comment: Has been HIV tested in past and has been negative.  Lifestyle  . Physical activity:    Days per week: Not on file    Minutes per session: Not on file  . Stress: Not on file  Relationships  . Social connections:    Talks on phone: Not on file    Gets together: Not on file    Attends religious service: Not on file    Active member of club or organization: Not on file    Attends meetings of clubs or organizations: Not on file    Relationship status: Not on file  Other Topics Concern  . Not on file  Social History Narrative   Transgendered male.  Single.  Lives alone.  Independent.   Additional Social History:    Pain Medications: See MAR Prescriptions: See MAR Over the Counter: See MAR History of alcohol / drug use?: Yes Longest period of sobriety (when/how long): 2 years Negative Consequences of Use: Financial, Personal relationships Name of Substance 1: Opiates 1 - Age of First Use: Mid 30's 1 - Amount (size/oz): Unknown 1 - Frequency: Occasionally 1 - Duration: Ongoing 1 - Last Use / Amount: 2 weeks ago Name of Substance 2: Cocaine 2 - Age of First Use: Early 30's 2 - Amount (size/oz): Unknown 2 - Frequency: Daily 2 - Duration: Off and on 2 - Last Use / Amount: yesterday  Sleep: Improving  Appetite:  Fair  Current Medications: Current Facility-Administered Medications  Medication  Dose Route Frequency Provider Last Rate Last Dose  . acetaminophen (TYLENOL) tablet 650 mg  650 mg Oral Q6H PRN Lindon Romp A, NP      . alum & mag hydroxide-simeth (MAALOX/MYLANTA) 200-200-20 MG/5ML suspension 30 mL  30 mL Oral Q4H PRN Lindon Romp A, NP      . ARIPiprazole (ABILIFY) tablet 5 mg  5 mg Oral Daily Lindon Romp A, NP   5 mg at 11/19/17 0816  . escitalopram (LEXAPRO) tablet 30 mg  30 mg Oral Daily Cobos, Myer Peer, MD   30 mg at 11/19/17 0816  . estradiol (ESTRACE) tablet 2 mg  2 mg Oral TID Lindon Romp A, NP   2 mg at 11/19/17 1155  . finasteride (PROSCAR) tablet 2.5 mg  2.5 mg Oral Daily Lindon Romp A, NP   2.5 mg at 11/19/17 0816  . hydrOXYzine (ATARAX/VISTARIL) tablet 25 mg  25 mg Oral TID PRN Rozetta Nunnery, NP      .  magnesium hydroxide (MILK OF MAGNESIA) suspension 30 mL  30 mL Oral Daily PRN Lindon Romp A, NP      . nicotine (NICODERM CQ - dosed in mg/24 hours) patch 21 mg  21 mg Transdermal Daily Lindon Romp A, NP   21 mg at 11/19/17 0819  . prazosin (MINIPRESS) capsule 1 mg  1 mg Oral QHS Cobos, Myer Peer, MD   1 mg at 11/18/17 2150  . spironolactone (ALDACTONE) tablet 50 mg  50 mg Oral Daily Lindon Romp A, NP   50 mg at 11/19/17 0816  . traZODone (DESYREL) tablet 50 mg  50 mg Oral QHS PRN Cobos, Myer Peer, MD        Lab Results:  Results for orders placed or performed during the hospital encounter of 11/16/17 (from the past 48 hour(s))  Urine rapid drug screen (hosp performed)not at [facility]     Status: Abnormal   Collection Time: 11/18/17 12:00 PM  Result Value Ref Range   Opiates NONE DETECTED NONE DETECTED   Cocaine POSITIVE (A) NONE DETECTED   Benzodiazepines NONE DETECTED NONE DETECTED   Amphetamines NONE DETECTED NONE DETECTED   Tetrahydrocannabinol NONE DETECTED NONE DETECTED   Barbiturates (A) NONE DETECTED    Result not available. Reagent lot number recalled by manufacturer.    Comment: Performed at [facility] [place] [address] [place], [place] [address]    Blood Alcohol level:  Lab Results  Component Value Date   ETH <10 06/12/2017   ETH <5 34/19/6222    Metabolic Disorder Labs: Lab Results  Component Value Date   HGBA1C 5.4 11/17/2017   MPG 108 11/17/2017   MPG 105.41 06/12/2017   Lab Results  Component Value Date   PROLACTIN 14.2 11/17/2017   PROLACTIN 6.3 09/09/2016   Lab Results  Component Value Date   CHOL 152 11/17/2017   TRIG 87 11/17/2017   HDL 38 (L) 11/17/2017   CHOLHDL 4.0 11/17/2017   VLDL 17 11/17/2017   LDLCALC 97 11/17/2017   LDLCALC 91 06/12/2017    Physical Findings: AIMS:  , ,  ,  ,    CIWA:    COWS:     Musculoskeletal: Strength & Muscle Tone: within normal limits Gait & Station: normal Patient leans: N/A  Psychiatric Specialty Exam: Physical Exam  ROS- denies headache, no chest pain, no shortness of breath, no vomiting   Blood pressure 104/74, pulse 61, temperature 97.8 F (36.6 C), temperature source Oral, resp. rate 20, height '5\' 6"'  (1.676 m), weight 78 kg (172 lb), SpO2 98 %.Body mass index is 27.76 kg/m.  General Appearance: Fairly Groomed  Eye Contact:  Improving eye contact  Speech:  Normal Rate  Volume:  Improving  Mood:  Remains depressed but acknowledges mood is improving  Affect:  Becoming more reactive, smiling at times appropriately  Thought Process:  Linear and Descriptions of Associations: Intact  Orientation:  Full (Time, Place, and Person)  Thought Content:  no hallucinations, no delusions, not internally preoccupied   Suicidal Thoughts:  No- at this time denies suicidal or self injurious ideations, denies homicidal or violent ideations, currently is able to contract for safety on unit   Homicidal Thoughts:  No  Memory:  recent and remote grossly intact   Judgement:  Other:  improving   Insight:  improving   Psychomotor Activity:  Normal  Concentration:  Concentration: Good and Attention Span: Good  Recall:  Good  Fund of Knowledge:  Good   Language:  Good  Akathisia:  Negative  Handed:  Right  AIMS (if indicated):     Assets:  Communication Skills Desire for Improvement Resilience  ADL's:  Intact  Cognition:  WNL  Sleep:  Number of Hours: 4.5   Assessment-patient acknowledges partially improved mood.  Although remains depressed, is presenting with a more reactive affect, improved eye contact, improved rate of speech, and seems more future oriented at this time, focusing more on disposition plans.  Thus far tolerating medications well.  Currently denies suicidal ideations  Treatment Plan Summary: Daily contact with patient to assess and evaluate symptoms and progress in treatment, Medication management, Plan inpatient treatment  and medications as below  Treatment plan reviewed as below today July 12 Encourage group and miilieu participation to work on Radiographer, therapeutic and symptom reduction Continue Abilify 5 mgrs QDAY for depression, mood disorder  Continue Lexapro 30 mgrs QDAY for depression Continue Minipress 1 mgr QHS for nightmares  Continue Vistaril 25 mgrs Q 8 hours PRN for anxiety as needed Continue Trazodone 50 mgrs QHS PRN for insomnia  Treatment Team working on disposition planning -patient currently expressing interest in going to a rehab program at discharge Jenne Campus, MD 11/19/2017, 11:57 AM   Patient ID: Randy [name], adult   DOB: 1971-04-20, 47 y.o.   MRN: [id number]

## 2017-11-19 NOTE — Progress Notes (Signed)
Recreation Therapy Notes  Date: 7.12.19 Time: 0930 Location: [address] Dayroom  Group Topic: Stress Management  Goal Area(s) Addresses:  Patient will verbalize importance of using healthy stress management.  Patient will identify positive emotions associated with healthy stress management.   Behavioral Response: Engaged  Intervention: Stress Management  Activity :  Meditation.  LRT introduced the stress management technique of meditation.  LRT played a meditation on being resilient.  Patients were to listen to the meditation and follow along as meditation played.  Education:  Stress Management, Discharge Planning.   Education Outcome: Acknowledges edcuation/In group clarification offered/Needs additional education  Clinical Observations/Feedback: Pt attended and participated in group.    Caroll RancherMarjette Shanan Mcmiller, LRT/CTRS         Caroll RancherLindsay, Zanya Lindo A 11/19/2017 11:33 AM

## 2017-11-19 NOTE — [facility] Group Notes (Signed)
[facility] LCSW Group Therapy Note  Date/Time: 11/19/17, 1315  Type of Therapy/Topic:  Group Therapy:  Feelings about Diagnosis  Participation Level:  Minimal   Mood:pleasant   Description of Group:    This group will allow patients to explore their thoughts and feelings about diagnoses they have received. Patients will be guided to explore their level of understanding and acceptance of these diagnoses. Facilitator will encourage patients to process their thoughts and feelings about the reactions of others to their diagnosis, and will guide patients in identifying ways to discuss their diagnosis with significant others in their lives. This group will be process-oriented, with patients participating in exploration of their own experiences as well as giving and receiving support and challenge from other group members.   Therapeutic Goals: 1. Patient will demonstrate understanding of diagnosis as evidence by identifying two or more symptoms of the disorder:  2. Patient will be able to express two feelings regarding the diagnosis 3. Patient will demonstrate ability to communicate their needs through discussion and/or role plays  Summary of Patient Progress: Pt did not speak up much during group discussion today but did respond to CSW questions and shared that he has had some good support from people regarding his diagnosis.  Good participation for a brief period of time and quiet for the rest of group, but attentive.        Therapeutic Modalities:   Cognitive Behavioral Therapy Brief Therapy Feelings Identification   Daleen SquibbGreg Edith Lord, LCSW

## 2017-11-19 NOTE — Progress Notes (Signed)
DAR NOTE: Patient presents with flat affect and depressed mood.  Pt observed in the dayroom, smiled  on approach stated he had a good day. Denies pain, auditory and visual hallucinations.   Maintained on routine safety checks.  Medications given as prescribed.  Support and encouragement offered as needed. Pt's safety ensured with 15 minute and environmental checks.  Pt verbally agrees to seek staff if SI/HI or A/VH occurs and to consult with staff before acting on these thoughts. Will continue POC.

## 2017-11-19 NOTE — Progress Notes (Signed)
D: Pt was in her room upon initial approach.  Pt presents with depressed affect and mood.  She describes her day as "okay" and reports goal is "making it to all the groups."  Pt denies SI/HI, denies hallucinations, denies pain.  Pt has been visible in milieu interacting with peers and staff appropriately.  Pt attended evening group.    A: Introduced self to pt.  Actively listened to pt and offered support and encouragement. Medication administered per order.  Q15 minute safety checks maintained.  R: Pt is safe on the unit.  Pt is compliant with medication.  Pt verbally contracts for safety.  Will continue to monitor and assess.

## 2017-11-19 NOTE — Progress Notes (Signed)
Patient stated in group that she had a good day but did not go into further detail. Her goal for tomorrow is to attend all of her groups.

## 2017-11-19 NOTE — Progress Notes (Signed)
D: Patient presents with flat, blunted affect.  Her mood is depressed.  She continues to complain of poor sleep; fair appetite; her energy level is low and her concentration is poor.  She states, "I feel a little bit better since I've been here."  She denies any thoughts of self harm.  Patient has been isolative to her room.  She rates her depression as a 7; her hopelessness as a 6 and anxiety as a 4.  Her goal is to "stay awake for all the groups."    A: Continue to monitor medication management and MD orders.  Safety checks completed every 15 minutes per protocol.  Offer support and encouragement as needed.  R: Patient is receptive to staff; his behavior is appropriate.

## 2017-11-20 NOTE — Progress Notes (Signed)
Patient ID: Randy [name], adult   DOB: Sep 24, 1970, 47 y.o.   MRN: [id number] Per State regulations 482.30 this chart was reviewed for medical necessity with respect to the patient's admission/duration of stay.    Next review date: 11/24/17  Thurman CoyerEric Tylek Boney, BSN, RN-BC  Case Manager

## 2017-11-20 NOTE — Progress Notes (Signed)
Patient states that she had a good day since her medication was adjusted. She is complaining of feeling drowsy quite a bit. Her goal for tomorrow is to get more rest.

## 2017-11-20 NOTE — [facility] Group Notes (Signed)
LCSW Group Therapy Note  11/20/2017    1:15-2:25pm  Type of Therapy and Topic:  Group Therapy: Anger and Coping Skills  Participation Level:  Active   Description of Group:   In this group, patients learned how to recognize the physical, cognitive, emotional, and behavioral responses they have to anger-provoking situations.  They identified how they usually or often react when angered, and learned how healthy and unhealthy coping skills work initially, but the unhealthy ones stop working.   They analyzed how their frequently-chosen coping skill is possibly beneficial and how it is possibly unhelpful.  The group discussed a variety of healthier coping skills that could help in resolving the actual issues, as well as how to go about planning for the the possibility of future similar situations.  Therapeutic Goals: 1. Patients will identify one thing that makes them angry and how they feel emotionally and physically, what their thoughts are or tend to be in those situations, and what healthy or unhealthy coping mechanism they typically use 2. Patients will identify how their coping technique works for them, as well as how it works against them. 3. Patients will explore possible new behaviors to use in future anger situations. 4. Patients will learn that anger itself is normal and cannot be eliminated, and that healthier coping skills can assist with resolving conflict rather than worsening situations.  Summary of Patient Progress:  The patient shared that she often uses drugs and alcohol when angry.  She went into detail about how she got angry at the doctors and her boyfriend and even herself when her boyfriend died in January.  She also shared insights that showed significant growth.  Therapeutic Modalities:   Cognitive Behavioral Therapy Motivation Interviewing  Lynnell ChadMareida J Grossman-Orr  .

## 2017-11-20 NOTE — Progress Notes (Signed)
[facility] MD Progress Note  11/20/2017 12:26 PM Randy [name]  MRN:  [id number] Subjective: Describes partially improved mood compared to admission, endorses lingering depression.  Denies suicidal ideations.  Denies medication side effects. Objective : I have discussed case with treatment team and have met with patient. 47 year old transgender male, who presented with worsening depression and suicidal ideations . History of depression, recently worsened after deceased SO's family member established contact with patient to see if she wanted to keep his ashes . History of cocaine abuse. Patient presents alert, attentive, calm, superficially cooperative. She  remains isolative, often in her room, but noted by staff to be more visible on the unit at times and starting to interact more with peers.  Yesterday had expressed interest in going to a rehab setting ( [facility]) at discharge, today states "I think I have changed my mind, I would rather go home) Denies medication side effects. Denies current suicidal ideations.  Principal Problem:  Depression Diagnosis:   Patient Active Problem List   Diagnosis Date Noted  . Severe recurrent major depression without psychotic features ([place]) [F33.2] 11/16/2017  . Liver fibrosis [K74.0] 06/22/2017  . MDD (major depressive disorder), single episode, severe , no psychosis ([place]) [F32.2] 06/11/2017  . Cough [R05] 03/14/2015  . Major depressive disorder, recurrent, severe without psychotic features ([place]) [F33.2]   . Opioid dependence with opioid-induced mood disorder ([place]) [F11.24]   . Cocaine dependence with cocaine-induced mood disorder ([place]) [F14.24]   . MDD (major depressive disorder) [F32.9] 09/17/2014  . MDD (major depressive disorder), recurrent severe, without psychosis ([place]) [F33.2] 07/05/2014  . Major depression, recurrent ([place]) [F33.9] 04/10/2012  . Cocaine dependence ([place]) [F14.20] 04/10/2012  . Polysubstance abuse including IVDA (heroin), cocaine, marijuana  [F19.10] 01/10/2012  . Elevated LFTs [R94.5] 01/10/2012  . Chronic hepatitis C without hepatic coma ([place]) [B18.2] 01/10/2012  . 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 Tobacco abuse [Z72.0] 01/10/2012  . Anxiety disorder [F41.9] 08/14/2011  . Recurrent major depression-severe ([place]) [F33.2] 08/13/2011   Total Time spent with patient: 15 minutes  Past Psychiatric History:   Past Medical History:  Past Medical History:  Diagnosis Date  . Cellulitis  Of  Left Forearm 01/10/2012   From IVDA  . Depression   . Hepatitis C   . 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 I&D EXTREMITY  01/13/2012   Procedure: IRRIGATION AND DEBRIDEMENT EXTREMITY;  Surgeon: Sharmon Revere, MD;  Location: [facility];  Service: Orthopedics;  Laterality: Left;   Family History:  Family History  Problem Relation Age of Onset  . Idiopathic pulmonary fibrosis Father    Family Psychiatric  History:  Social History:  Social History   Substance and Sexual Activity  Alcohol Use Yes   Comment: occ     Social History   Substance and Sexual Activity  Drug Use Yes  . Types: Heroin, "Crack" cocaine, Marijuana   Comment: heroin last used 07/14/14, crack/ had some a few days ago    Social History   Socioeconomic History  . Marital status: Single    Spouse name: Not on file  . Number of children: Not on file  . Years of education: Not on file  . Highest education level: Not on file  Occupational History  . 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 Financial resource strain: Not on file  . Food insecurity:  Worry: Not on file    Inability: Not on file  . Transportation needs:    Medical: Not on file    Non-medical: Not on file  Tobacco Use  . Smoking status: Current Every Day Smoker    Packs/day: 0.50    Years: 20.00    Pack years: 10.00    Types: Cigarettes  . Smokeless tobacco:  Never Used  . Tobacco comment: trying to quit-using e-cig some  Substance and Sexual Activity  . Alcohol use: Yes    Comment: occ  . Drug use: Yes    Types: Heroin, "Crack" cocaine, Marijuana    Comment: heroin last used 07/14/14, crack/ had some a few days ago  . Sexual activity: Yes    Partners: Male    Birth control/protection: Condom    Comment: Has been HIV tested in past and has been negative.  Lifestyle  . Physical activity:    Days per week: Not on file    Minutes per session: Not on file  . Stress: Not on file  Relationships  . Social connections:    Talks on phone: Not on file    Gets together: Not on file    Attends religious service: Not on file    Active member of club or organization: Not on file    Attends meetings of clubs or organizations: Not on file    Relationship status: Not on file  Other Topics Concern  . Not on file  Social History Narrative   Transgendered male.  Single.  Lives alone.  Independent.   Additional Social History:    Pain Medications: See MAR Prescriptions: See MAR Over the Counter: See MAR History of alcohol / drug use?: Yes Longest period of sobriety (when/how long): 2 years Negative Consequences of Use: Financial, Personal relationships Name of Substance 1: Opiates 1 - Age of First Use: Mid 30's 1 - Amount (size/oz): Unknown 1 - Frequency: Occasionally 1 - Duration: Ongoing 1 - Last Use / Amount: 2 weeks ago Name of Substance 2: Cocaine 2 - Age of First Use: Early 30's 2 - Amount (size/oz): Unknown 2 - Frequency: Daily 2 - Duration: Off and on 2 - Last Use / Amount: yesterday  Sleep: Fair  Appetite:  Improving  Current Medications: Current Facility-Administered Medications  Medication Dose Route Frequency Provider Last Rate Last Dose  . acetaminophen (TYLENOL) tablet 650 mg  650 mg Oral Q6H PRN Lindon Romp A, NP      . alum & mag hydroxide-simeth (MAALOX/MYLANTA) 200-200-20 MG/5ML suspension 30 mL  30 mL Oral Q4H PRN  Lindon Romp A, NP      . ARIPiprazole (ABILIFY) tablet 5 mg  5 mg Oral Daily Lindon Romp A, NP   5 mg at 11/20/17 0752  . escitalopram (LEXAPRO) tablet 30 mg  30 mg Oral Daily Cobos, Myer Peer, MD   30 mg at 11/20/17 0751  . estradiol (ESTRACE) tablet 2 mg  2 mg Oral TID Lindon Romp A, NP   2 mg at 11/20/17 0751  . finasteride (PROSCAR) tablet 2.5 mg  2.5 mg Oral Daily Lindon Romp A, NP   2.5 mg at 11/20/17 0751  . hydrOXYzine (ATARAX/VISTARIL) tablet 25 mg  25 mg Oral TID PRN Lindon Romp A, NP      . magnesium hydroxide (MILK OF MAGNESIA) suspension 30 mL  30 mL Oral Daily PRN Lindon Romp A, NP      . nicotine (NICODERM CQ - dosed in mg/24 hours) patch 21  mg  21 mg Transdermal Daily Lindon Romp A, NP   21 mg at 11/20/17 0754  . prazosin (MINIPRESS) capsule 1 mg  1 mg Oral QHS Cobos, Myer Peer, MD   1 mg at 11/19/17 2114  . spironolactone (ALDACTONE) tablet 50 mg  50 mg Oral Daily Lindon Romp A, NP   50 mg at 11/20/17 0751  . traZODone (DESYREL) tablet 50 mg  50 mg Oral QHS PRN Cobos, Myer Peer, MD        Lab Results:  No results found for this or any previous visit (from the past 48 hour(s)).  Blood Alcohol level:  Lab Results  Component Value Date   ETH <10 06/12/2017   ETH <5 71/21/9758    Metabolic Disorder Labs: Lab Results  Component Value Date   HGBA1C 5.4 11/17/2017   MPG 108 11/17/2017   MPG 105.41 06/12/2017   Lab Results  Component Value Date   PROLACTIN 14.2 11/17/2017   PROLACTIN 6.3 09/09/2016   Lab Results  Component Value Date   CHOL 152 11/17/2017   TRIG 87 11/17/2017   HDL 38 (L) 11/17/2017   CHOLHDL 4.0 11/17/2017   VLDL 17 11/17/2017   LDLCALC 97 11/17/2017   LDLCALC 91 06/12/2017    Physical Findings: AIMS:  , ,  ,  ,    CIWA:    COWS:     Musculoskeletal: Strength & Muscle Tone: within normal limits Gait & Station: normal Patient leans: N/A  Psychiatric Specialty Exam: Physical Exam  ROS- denies headache, no chest pain, no  shortness of breath, no vomiting   Blood pressure 116/86, pulse 97, temperature 98.5 F (36.9 C), temperature source Oral, resp. rate 18, height '5\' 6"'  (1.676 m), weight 78 kg (172 lb), SpO2 98 %.Body mass index is 27.76 kg/m.  General Appearance: Fairly Groomed  Eye Contact:  Improving eye contact  Speech:  Normal Rate  Volume:  Improving  Mood:  Depressed, acknowledges partial improvement  Affect:  Flat, but smiles briefly at times  Thought Process:  Linear and Descriptions of Associations: Intact  Orientation:  Full (Time, Place, and Person)  Thought Content:  no hallucinations, no delusions, not internally preoccupied   Suicidal Thoughts:  No- at this time denies suicidal or self injurious ideations, denies homicidal or violent ideations, currently is able to contract for safety on unit   Homicidal Thoughts:  No  Memory:  recent and remote grossly intact   Judgement:  Other:  improving   Insight:  improving   Psychomotor Activity:  Normal  Concentration:  Concentration: Good and Attention Span: Good  Recall:  Good  Fund of Knowledge:  Good  Language:  Good  Akathisia:  Negative  Handed:  Right  AIMS (if indicated):     Assets:  Communication Skills Desire for Improvement Resilience  ADL's:  Intact  Cognition:  WNL  Sleep:  Number of Hours: 5   Assessment-she continues to present depressed, with a generally subdued/blunted affect.  Presents calm and superficially cooperative, but tends to answer questions with monosyllables or brief phrases/does not elaborate.  However, report from staff is that she is becoming more visible on unit, gradually more interactive with peers.  Denies suicidal ideations, and is starting to focus more on disposition planning.  Of note yesterday expressed interest in going to [facility] rehab setting at discharge, but today states she would prefer to go home at discharge. Has tolerated Lexapro titration well, and reports it seems to be working .  Treatment  Plan Summary: Daily contact with patient to assess and evaluate symptoms and progress in treatment, Medication management, Plan inpatient treatment  and medications as below  Treatment plan reviewed as below today July 13 Encourage group and miilieu participation to work on Radiographer, therapeutic and symptom reduction Continue Abilify 5 mgrs QDAY for depression, mood disorder  Continue Lexapro 30 mgrs QDAY for depression Continue Minipress 1 mgr QHS for nightmares  Continue Vistaril 25 mgrs Q 8 hours PRN for anxiety as needed Continue Trazodone 50 mgrs QHS PRN for insomnia  Treatment Team working on disposition planning  Jenne Campus, MD 11/20/2017, 12:26 PM   Patient ID: Kyra Manges, adult   DOB: 01/12/1971, 47 y.o.   MRN: [id number]

## 2017-11-20 NOTE — Progress Notes (Signed)
D:  Patient remains isolative and withdrawn.  She is pleasant with staff exhibiting minimal interaction.  She denies any thoughts of self harm.  She rates her depression as a 6; hopelessness and anxiety as a 5.  Her goal today is to "stay awake all day."  She complains of poor sleep; appetite is fair.  Her energy level is low and her concentration is poor.    A: Continue to monitor medication management and MD orders.  Safety checks completed every 15 minutes per protocol.  Offer support and encouragement as needed.  R: Patient is isolative and withdrawn.

## 2017-11-21 MED ORDER — ARIPIPRAZOLE 2 MG PO TABS
2.0000 mg | ORAL_TABLET | Freq: Every day | ORAL | Status: DC
  Administered 2017-11-22 – 2017-11-23 (×2): 2 mg via ORAL
  Filled 2017-11-21 (×3): qty 1

## 2017-11-21 MED ORDER — MIRTAZAPINE 7.5 MG PO TABS
7.5000 mg | ORAL_TABLET | Freq: Every day | ORAL | Status: DC
  Administered 2017-11-21 – 2017-11-22 (×2): 7.5 mg via ORAL
  Filled 2017-11-21 (×3): qty 1

## 2017-11-21 NOTE — Progress Notes (Signed)
D: Patient is lethargic and drowsy.  She did not come up for her medications when prompted.  She continues to have flat affect and depressed mood, however, she denies any thoughts of self harm.  Patient's energy level is low; concentration is poor.  She has minimal interaction with staff and peers.  Patient is guarded and cautious.    A: Continue to monitor medication management and MD orders. Safety checks continued every 15 minutes per protocol.  Offer support and encouragement as needed.  R: Patient continues to be withdrawn and isolative to her room.

## 2017-11-21 NOTE — Progress Notes (Signed)
Writer has observed patient isolative to her room for most of the evening other than attending group and snack. Writer spoke with her 1:1 and she reported having had a good day and voiced no complaints. Writer informed her of medication scheduled. She was observed lying down on her bed resting. Support given and safety maintained on unit with 15 min checks.

## 2017-11-21 NOTE — [facility] Group Notes (Signed)
[facility] LCSW Group Therapy Note  11/21/2017  10:00-11:00AM  Type of Therapy and Topic:  Group Therapy:  Being Your Own Support  Participation Level:  Active   Description of Group:  Patients in this group were introduced to the concept that self-support is an essential part of recovery.  A song entitled "My Own Hero" was played and a group discussion ensued in which patients stated they could relate to the song and it inspired them to realize they have be willing to help themselves in order to succeed, because other people cannot achieve sobriety or stability for them.  We discussed adding a variety of healthy supports to address the various needs in their lives.  A song was played called "I Know Where I've Been" toward the end of group and used to conduct an inspirational wrap-up to group of remembering how far they have already come in their journey.  Therapeutic Goals: 1)  demonstrate the importance of being a part of one's own support system 2)  discuss reasons people in one's life may eventually be unable to be continually supportive  3)  identify the patient's current support system and   4)  elicit commitments to add healthy supports and to become more conscious of being self-supportive   Summary of Patient Progress:  The patient expressed that she has two close friends who have known her a long time and even though their support is "not the greatest" it is nonetheless significant.  She smiled a great deal and seemed more relaxed.   Therapeutic Modalities:   Motivational Interviewing Activity  Lynnell ChadMareida J Grossman-Orr

## 2017-11-21 NOTE — Plan of Care (Signed)
  Problem: Education: Goal: Mental status will improve Outcome: Progressing   Problem: Activity: Goal: Interest or engagement in activities will improve Outcome: Not Progressing   

## 2017-11-21 NOTE — Progress Notes (Signed)
Patient has been isolative to her room most of the evening. Peers requested that she come to the dayroom so that they could talk to her and put forth and effort to include her and welcome her in their interactions when in the dayroom. She has been in the dayroom talking with peers. Safety maintained on unit with 15 min checks.

## 2017-11-21 NOTE — Progress Notes (Addendum)
[facility] MD Progress Note  11/21/2017 12:27 PM Randy [name]  MRN:  [id number] Subjective: Patient reports partial improvement compared to admission, but acknowledges some ongoing depression, and describes general low energy and some ongoing sense of anhedonia.  Denies suicidal ideations. Denies medication side effects.  Objective : I have discussed case with treatment team and have met with patient. 47 year old transgender male, who presented with worsening depression and suicidal ideations . History of depression and of cocaine abuse, recently worsened after deceased SO's family member established contact with patient to see if she wanted to keep his ashes .  Patient continues to present isolative, spending most time in her room, superficially pleasant/polite on approach but tending to answer questions with short phrases or monosyllables.  As above, endorses persistent but improving depression and does acknowledge doing better than before admission, describes low energy level.  Currently denies suicidal ideations.  She is tolerating medications well, does state that recent Lexapro titration up to 30 mg a day helped partially.  Denies medication side effects   Principal Problem:  Depression Diagnosis:   Patient Active Problem List   Diagnosis Date Noted  . Severe recurrent major depression without psychotic features ([place]) [F33.2] 11/16/2017  . Liver fibrosis [K74.0] 06/22/2017  . MDD (major depressive disorder), single episode, severe , no psychosis ([place]) [F32.2] 06/11/2017  . Cough [R05] 03/14/2015  . Major depressive disorder, recurrent, severe without psychotic features ([place]) [F33.2]   . Opioid dependence with opioid-induced mood disorder ([place]) [F11.24]   . Cocaine dependence with cocaine-induced mood disorder ([place]) [F14.24]   . MDD (major depressive disorder) [F32.9] 09/17/2014  . MDD (major depressive disorder), recurrent severe, without psychosis ([place]) [F33.2] 07/05/2014  . Major  depression, recurrent ([place]) [F33.9] 04/10/2012  . Cocaine dependence ([place]) [F14.20] 04/10/2012  . Polysubstance abuse including IVDA (heroin), cocaine, marijuana [F19.10] 01/10/2012  . Elevated LFTs [R94.5] 01/10/2012  . Chronic hepatitis C without hepatic coma ([place]) [B18.2] 01/10/2012  . 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 Tobacco abuse [Z72.0] 01/10/2012  . Anxiety disorder [F41.9] 08/14/2011  . Recurrent major depression-severe ([place]) [F33.2] 08/13/2011   Total Time spent with patient: 20 minutes  Past Psychiatric History:   Past Medical History:  Past Medical History:  Diagnosis Date  . Cellulitis  Of  Left Forearm 01/10/2012   From IVDA  . Depression   . Hepatitis C   . 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 I&D EXTREMITY  01/13/2012   Procedure: IRRIGATION AND DEBRIDEMENT EXTREMITY;  Surgeon: Sharmon Revere, MD;  Location: [facility] ORS;  Service: Orthopedics;  Laterality: Left;   Family History:  Family History  Problem Relation Age of Onset  . Idiopathic pulmonary fibrosis Father    Family Psychiatric  History:  Social History:  Social History   Substance and Sexual Activity  Alcohol Use Yes   Comment: occ     Social History   Substance and Sexual Activity  Drug Use Yes  . Types: Heroin, "Crack" cocaine, Marijuana   Comment: heroin last used 07/14/14, crack/ had some a few days ago    Social History   Socioeconomic History  . Marital status: Single    Spouse name: Not on file  . Number of children: Not on file  . Years of education: Not on file  . Highest education level: Not on file  Occupational History  . 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 Financial resource strain: Not on file  . Food insecurity:    Worry: Not on file    Inability: Not on file  . Transportation needs:    Medical: Not on file    Non-medical: Not on file   Tobacco Use  . Smoking status: Current Every Day Smoker    Packs/day: 0.50    Years: 20.00    Pack years: 10.00    Types: Cigarettes  . Smokeless tobacco: Never Used  . Tobacco comment: trying to quit-using e-cig some  Substance and Sexual Activity  . Alcohol use: Yes    Comment: occ  . Drug use: Yes    Types: Heroin, "Crack" cocaine, Marijuana    Comment: heroin last used 07/14/14, crack/ had some a few days ago  . Sexual activity: Yes    Partners: Male    Birth control/protection: Condom    Comment: Has been HIV tested in past and has been negative.  Lifestyle  . Physical activity:    Days per week: Not on file    Minutes per session: Not on file  . Stress: Not on file  Relationships  . Social connections:    Talks on phone: Not on file    Gets together: Not on file    Attends religious service: Not on file    Active member of club or organization: Not on file    Attends meetings of clubs or organizations: Not on file    Relationship status: Not on file  Other Topics Concern  . Not on file  Social History Narrative   Transgendered male.  Single.  Lives alone.  Independent.   Additional Social History:    Pain Medications: See MAR Prescriptions: See MAR Over the Counter: See MAR History of alcohol / drug use?: Yes Longest period of sobriety (when/how long): 2 years Negative Consequences of Use: Financial, Personal relationships Name of Substance 1: Opiates 1 - Age of First Use: Mid 30's 1 - Amount (size/oz): Unknown 1 - Frequency: Occasionally 1 - Duration: Ongoing 1 - Last Use / Amount: 2 weeks ago Name of Substance 2: Cocaine 2 - Age of First Use: Early 30's 2 - Amount (size/oz): Unknown 2 - Frequency: Daily 2 - Duration: Off and on 2 - Last Use / Amount: yesterday  Sleep: Fair  Appetite:  Improving  Current Medications: Current Facility-Administered Medications  Medication Dose Route Frequency Provider Last Rate Last Dose  . acetaminophen (TYLENOL)  tablet 650 mg  650 mg Oral Q6H PRN Lindon Romp A, NP      . alum & mag hydroxide-simeth (MAALOX/MYLANTA) 200-200-20 MG/5ML suspension 30 mL  30 mL Oral Q4H PRN Lindon Romp A, NP      . ARIPiprazole (ABILIFY) tablet 5 mg  5 mg Oral Daily Lindon Romp A, NP   5 mg at 11/21/17 0951  . escitalopram (LEXAPRO) tablet 30 mg  30 mg Oral Daily Demiya Magno, Myer Peer, MD   30 mg at 11/21/17 0952  . estradiol (ESTRACE) tablet 2 mg  2 mg Oral TID Lindon Romp A, NP   2 mg at 11/21/17 0952  . finasteride (PROSCAR) tablet 2.5 mg  2.5 mg Oral Daily Lindon Romp A, NP   2.5 mg at 11/21/17 0951  . hydrOXYzine (ATARAX/VISTARIL) tablet 25 mg  25 mg Oral TID PRN Lindon Romp A, NP   25 mg at 11/21/17 0331  . magnesium hydroxide (MILK OF MAGNESIA) suspension 30 mL  30 mL Oral  Daily PRN Lindon Romp A, NP      . nicotine (NICODERM CQ - dosed in mg/24 hours) patch 21 mg  21 mg Transdermal Daily Lindon Romp A, NP   21 mg at 11/21/17 0950  . prazosin (MINIPRESS) capsule 1 mg  1 mg Oral QHS Kyonna Frier, Myer Peer, MD   1 mg at 11/20/17 2126  . spironolactone (ALDACTONE) tablet 50 mg  50 mg Oral Daily Lindon Romp A, NP   50 mg at 11/21/17 0951  . traZODone (DESYREL) tablet 50 mg  50 mg Oral QHS PRN Royce Sciara, Myer Peer, MD        Lab Results:  No results found for this or any previous visit (from the past 48 hour(s)).  Blood Alcohol level:  Lab Results  Component Value Date   ETH <10 06/12/2017   ETH <5 65/78/4696    Metabolic Disorder Labs: Lab Results  Component Value Date   HGBA1C 5.4 11/17/2017   MPG 108 11/17/2017   MPG 105.41 06/12/2017   Lab Results  Component Value Date   PROLACTIN 14.2 11/17/2017   PROLACTIN 6.3 09/09/2016   Lab Results  Component Value Date   CHOL 152 11/17/2017   TRIG 87 11/17/2017   HDL 38 (L) 11/17/2017   CHOLHDL 4.0 11/17/2017   VLDL 17 11/17/2017   LDLCALC 97 11/17/2017   LDLCALC 91 06/12/2017    Physical Findings: AIMS:  , ,  ,  ,    CIWA:    COWS:      Musculoskeletal: Strength & Muscle Tone: within normal limits Gait & Station: normal Patient leans: N/A  Psychiatric Specialty Exam: Physical Exam  ROS- denies headache, no chest pain, no shortness of breath, no vomiting   Blood pressure 114/74, pulse 65, temperature 98.5 F (36.9 C), temperature source Oral, resp. rate 18, height '5\' 6"'  (1.676 m), weight 78 kg (172 lb), SpO2 98 %.Body mass index is 27.76 kg/m.  General Appearance: Fairly Groomed  Eye Contact:  Improving eye contact  Speech:  Normal Rate  Volume:  Normal  Mood:  Remains depressed, some improvement compared to admission  Affect:  Blunted affect, smiles at times appropriately  Thought Process:  Linear and Descriptions of Associations: Intact  Orientation:  Full (Time, Place, and Person)  Thought Content:  no hallucinations, no delusions, not internally preoccupied   Suicidal Thoughts:  No- at this time denies suicidal or self injurious ideations, denies homicidal or violent ideations, currently is able to contract for safety on unit   Homicidal Thoughts:  No  Memory:  recent and remote grossly intact   Judgement:  Other:  improving   Insight:  improving   Psychomotor Activity:  Decreased  Concentration:  Concentration: Good and Attention Span: Good  Recall:  Good  Fund of Knowledge:  Good  Language:  Good  Akathisia:  Negative  Handed:  Right  AIMS (if indicated):     Assets:  Communication Skills Desire for Improvement Resilience  ADL's:  Intact  Cognition:  WNL  Sleep:  Number of Hours: 3.5   Assessment-patient continues to present with depression and tends to be isolative to room with limited interactions with peers and staff.  Denies suicidal ideations .  Denies medication side effects, and reports she feels that medications are helping, albeit partially.  Sleep is fair, which may be partly related to frequent napping during the day disrupting sleep wake cycle. We have reviewed options- agrees to Remeron  for further antidepressant management. I have encouraged  her to increase milieu participation.   Treatment Plan Summary: Daily contact with patient to assess and evaluate symptoms and progress in treatment, Medication management, Plan inpatient treatment  and medications as below  Treatment plan reviewed as below today July 14 Encourage group and miilieu participation to work on Radiographer, therapeutic and symptom reduction Adjust Abilify to 2 mgrs QDAY for depression, mood disorder  Continue Lexapro 30 mgrs QDAY for depression Continue Minipress 1 mgr QHS for nightmares  Continue Vistaril 25 mgrs Q 8 hours PRN for anxiety as needed Discontinue Trazodone. Start Remeron 7.5 mg nightly for insomnia and for antidepressant management Treatment Team working on disposition planning  Jenne Campus, MD 11/21/2017, 12:27 PM   Patient ID: Kyra Manges, adult   DOB: 06-06-1970, 47 y.o.   MRN: [id number]

## 2017-11-22 NOTE — [facility] Group Notes (Signed)
[facility] LCSW Group Therapy Note  Date/Time: 11/22/17, 1315  Type of Therapy and Topic:  Group Therapy:  Overcoming Obstacles  Participation Level:  minimal  Description of Group:    In this group patients will be encouraged to explore what they see as obstacles to their own wellness and recovery. They will be guided to discuss their thoughts, feelings, and behaviors related to these obstacles. The group will process together ways to cope with barriers, with attention given to specific choices patients can make. Each patient will be challenged to identify changes they are motivated to make in order to overcome their obstacles. This group will be process-oriented, with patients participating in exploration of their own experiences as well as giving and receiving support and challenge from other group members.  Therapeutic Goals: 1. Patient will identify personal and current obstacles as they relate to admission. 2. Patient will identify barriers that currently interfere with their wellness or overcoming obstacles.  3. Patient will identify feelings, thought process and behaviors related to these barriers. 4. Patient will identify two changes they are willing to make to overcome these obstacles:    Summary of Patient Progress: Pt reported that grief and lack of a job are the major obstacles in her life currently.  Pt was attentive in group but did not contribute to group discussion.  She did respond to CSW questions.      Therapeutic Modalities:   Cognitive Behavioral Therapy Solution Focused Therapy Motivational Interviewing Relapse Prevention Therapy  Daleen SquibbGreg Gomer France, LCSW

## 2017-11-22 NOTE — Progress Notes (Signed)
D: Patient observed to be more visible in the milieu tonight. Minimal in conversation but pleasant. Affected blunted but brightens slightly on approach. Mood depressed but improving.  Denies pain, physical complaints.   A: Medicated per orders, no prns requested or required. Medication education provided. Level III obs in place for safety. Emotional support offered. Patient encouraged to complete Suicide Safety Plan before discharge. Encouraged to attend and participate in unit programming.   R: Patient verbalizes understanding of POC. Patient denies SI/HI/AVH and remains safe on level III obs. Will continue to monitor throughout the night.

## 2017-11-22 NOTE — Tx Team (Signed)
Interdisciplinary Treatment and Diagnostic Plan Update  11/22/2017 Time of Session: 0900 Randy [name] MRN: [id number]  Principal Diagnosis: <principal problem not specified>  Secondary Diagnoses: Active Problems:   Severe recurrent major depression without psychotic features (HCC)   Current Medications:  Current Facility-Administered Medications  Medication Dose Route Frequency Provider Last Rate Last Dose  . acetaminophen (TYLENOL) tablet 650 mg  650 mg Oral Q6H PRN Nira ConnBerry, Jason A, NP      . alum & mag hydroxide-simeth (MAALOX/MYLANTA) 200-200-20 MG/5ML suspension 30 mL  30 mL Oral Q4H PRN Nira ConnBerry, Jason A, NP      . ARIPiprazole (ABILIFY) tablet 2 mg  2 mg Oral Daily Cobos, Rockey SituFernando A, MD   2 mg at 11/22/17 0830  . escitalopram (LEXAPRO) tablet 30 mg  30 mg Oral Daily Cobos, Rockey SituFernando A, MD   30 mg at 11/22/17 0828  . estradiol (ESTRACE) tablet 2 mg  2 mg Oral TID Nira ConnBerry, Jason A, NP   2 mg at 11/22/17 0829  . finasteride (PROSCAR) tablet 2.5 mg  2.5 mg Oral Daily Nira ConnBerry, Jason A, NP   2.5 mg at 11/22/17 0827  . hydrOXYzine (ATARAX/VISTARIL) tablet 25 mg  25 mg Oral TID PRN Nira ConnBerry, Jason A, NP   25 mg at 11/21/17 0331  . magnesium hydroxide (MILK OF MAGNESIA) suspension 30 mL  30 mL Oral Daily PRN Nira ConnBerry, Jason A, NP      . mirtazapine (REMERON) tablet 7.5 mg  7.5 mg Oral QHS Cobos, Rockey SituFernando A, MD   7.5 mg at 11/21/17 2138  . nicotine (NICODERM CQ - dosed in mg/24 hours) patch 21 mg  21 mg Transdermal Daily Nira ConnBerry, Jason A, NP   21 mg at 11/22/17 0831  . prazosin (MINIPRESS) capsule 1 mg  1 mg Oral QHS Cobos, Rockey SituFernando A, MD   1 mg at 11/21/17 2138  . spironolactone (ALDACTONE) tablet 50 mg  50 mg Oral Daily Nira ConnBerry, Jason A, NP   50 mg at 11/22/17 0830   PTA Medications: Medications Prior to Admission  Medication Sig Dispense Refill Last Dose  . ARIPiprazole (ABILIFY) 5 MG tablet Take 1 tablet (5 mg total) by mouth daily. For mood control 30 tablet 0 unknown  . escitalopram (LEXAPRO) 20 MG  tablet Take 1 tablet (20 mg total) by mouth daily. For depression 30 tablet 0 unknown  . estradiol (ESTRACE) 2 MG tablet Take 1 tablet (2 mg total) by mouth 3 (three) times daily. For hormonal supplementation 90 tablet 0 unknown  . finasteride (PROSCAR) 5 MG tablet Take 0.5 tablets (2.5 mg total) by mouth daily. For hormonal treatment for male transgender process: Take 1/2 tablet 1 tablet 0 unknown  . prazosin (MINIPRESS) 2 MG capsule Take 1 capsule (2 mg total) by mouth at bedtime. For nightmares 30 capsule 0 unknown  . spironolactone (ALDACTONE) 50 MG tablet Take 1 tablet (50 mg total) by mouth daily. Part treatment for transgender process.   unknown  . hydrOXYzine (ATARAX/VISTARIL) 25 MG tablet Take 1 tablet (25 mg total) by mouth every 6 (six) hours as needed for anxiety. (Patient not taking: Reported on 06/21/2017) 60 tablet 0 Not Taking at Unknown time  . Ledipasvir-Sofosbuvir (HARVONI) 90-400 MG TABS Take 1 tablet by mouth daily. (Patient not taking: Reported on 11/16/2017) 28 tablet 2 Not Taking at Unknown time  . nicotine (NICODERM CQ - DOSED IN MG/24 HOURS) 21 mg/24hr patch Place 1 patch (21 mg total) onto the skin daily. (May purchase from over the counter): For smoking  cessation (Patient not taking: Reported on 06/21/2017) 28 patch 0 Not Taking at Unknown time  . traZODone (DESYREL) 50 MG tablet Take 1 tablet (50 mg total) by mouth at bedtime as needed for sleep. (Patient not taking: Reported on 06/21/2017) 30 tablet 0 Not Taking at Unknown time    Patient Stressors: Financial difficulties Health problems Legal issue Loss of ex boyfriend Occupational concerns Substance abuse  Patient Strengths: Ability for insight Average or above average intelligence Capable of independent living Communication skills General fund of knowledge Motivation for treatment/growth Supportive family/friends Work skills  Treatment Modalities: Medication Management, Group therapy, Case management,  1 to 1  session with clinician, Psychoeducation, Recreational therapy.   Physician Treatment Plan for Primary Diagnosis: <principal problem not specified> Long Term Goal(s): Improvement in symptoms so as ready for discharge Improvement in symptoms so as ready for discharge   Short Term Goals: Ability to identify changes in lifestyle to reduce recurrence of condition will improve Ability to maintain clinical measurements within normal limits will improve Ability to identify triggers associated with substance abuse/mental health issues will improve  Medication Management: Evaluate patient's response, side effects, and tolerance of medication regimen.  Therapeutic Interventions: 1 to 1 sessions, Unit Group sessions and Medication administration.  Evaluation of Outcomes: Progressing  Physician Treatment Plan for Secondary Diagnosis: Active Problems:   Severe recurrent major depression without psychotic features (HCC)  Long Term Goal(s): Improvement in symptoms so as ready for discharge Improvement in symptoms so as ready for discharge   Short Term Goals: Ability to identify changes in lifestyle to reduce recurrence of condition will improve Ability to maintain clinical measurements within normal limits will improve Ability to identify triggers associated with substance abuse/mental health issues will improve     Medication Management: Evaluate patient's response, side effects, and tolerance of medication regimen.  Therapeutic Interventions: 1 to 1 sessions, Unit Group sessions and Medication administration.  Evaluation of Outcomes: Progressing   RN Treatment Plan for Primary Diagnosis: <principal problem not specified> Long Term Goal(s): Knowledge of disease and therapeutic regimen to maintain health will improve  Short Term Goals: Ability to identify and develop effective coping behaviors will improve and Compliance with prescribed medications will improve  Medication Management: RN will  administer medications as ordered by provider, will assess and evaluate patient's response and provide education to patient for prescribed medication. RN will report any adverse and/or side effects to prescribing provider.  Therapeutic Interventions: 1 on 1 counseling sessions, Psychoeducation, Medication administration, Evaluate responses to treatment, Monitor vital signs and CBGs as ordered, Perform/monitor CIWA, COWS, AIMS and Fall Risk screenings as ordered, Perform wound care treatments as ordered.  Evaluation of Outcomes: Progressing   LCSW Treatment Plan for Primary Diagnosis: <principal problem not specified> Long Term Goal(s): Safe transition to appropriate next level of care at discharge, Engage patient in therapeutic group addressing interpersonal concerns.  Short Term Goals: Engage patient in aftercare planning with referrals and resources, Increase social support and Increase skills for wellness and recovery  Therapeutic Interventions: Assess for all discharge needs, 1 to 1 time with Social worker, Explore available resources and support systems, Assess for adequacy in community support network, Educate family and significant other(s) on suicide prevention, Complete Psychosocial Assessment, Interpersonal group therapy.  Evaluation of Outcomes: Progressing   Progress in Treatment: Attending groups: Yes Participating in groups: Yes Taking medication as prescribed: Yes. Toleration medication: Yes. Family/Significant other contact made: No, will contact:  pt refused consent Patient understands diagnosis: Yes. Discussing patient identified  problems/goals with staff: Yes. Medical problems stabilized or resolved: Yes. Denies suicidal/homicidal ideation: Yes. Issues/concerns per patient self-inventory: No. Other: none  New problem(s) identified: No, Describe:  none  New Short Term/Long Term Goal(s):  Patient Goals:   "To feel better."  Discharge Plan or Barriers:   Reason  for Continuation of Hospitalization: Depression Medication stabilization  Estimated Length of Stay: 2-4 days. Attendees: Patient: 11/22/2017   Physician: Dr Jama Flavors, MD 11/22/2017   Nursing: Nadean Corwin, RN 11/22/2017   RN Care Manager: 11/22/2017   Social Worker: Daleen Squibb, LCSW 11/22/2017   Recreational Therapist:  11/22/2017   Other:  11/22/2017   Other:  11/22/2017   Other: 11/22/2017         Scribe for Treatment Team: Lorri Frederick, LCSW 11/22/2017 11:50 AM

## 2017-11-22 NOTE — Progress Notes (Signed)
D) Pt. Affect and mood blunted and sad.  Pt.'s goal was to spend time with peer counselor today.  Pt. Continues to require encouragement to engage, but was noted attending groups today.  Pt. Reports family has not been supportive of pt's transgender identity and pt. States she has been transitioned for 15 years.  A) Pt. Offered emotional support and encouraged to express needs.  R) Pt. Remains safe at this time and offers no c/o.

## 2017-11-22 NOTE — Progress Notes (Signed)
[facility] MD Progress Note  11/22/2017 1:12 PM Randy [name]  MRN:  [id number] Subjective: Patient continues to report a gradual improvement of mood compared to how she felt prior to admission.  Denies suicidal ideations at this time.  Does state that she slept better, and that today she has an improved energy level compared to yesterday.  Denies side effects at this time.   Objective : I have discussed case with treatment team and have met with patient. 47 year old transgender male, who presented with worsening depression and suicidal ideations . History of depression and of cocaine abuse, recently worsened after deceased SO's family member established contact with patient to see if she wanted to keep his ashes .  Today patient seems more noticeably improved-out of room, visible in dayroom, grooming seems somewhat improved, eye contact better, and overall seems more communicative (during previous sessions has been polite, cooperative, but tending to answer questions with short phrases or monosyllables). Denies medication side effects.   As above,states she slept better last night, and we reviewed the importance of trying to avoid naps or sleeping during the day in order to help normalize sleep-wake cycle (she has reported sleeping during the day and then being up at night). Endorses intermittent cocaine cravings, but feels motivated to remain sober.  Recently had expressed interest in going to a rehab at discharge but at this time states she would prefer to return home once she is discharged.  Principal Problem:  Depression Diagnosis:   Patient Active Problem List   Diagnosis Date Noted  . Severe recurrent major depression without psychotic features ([place]) [F33.2] 11/16/2017  . Liver fibrosis [K74.0] 06/22/2017  . MDD (major depressive disorder), single episode, severe , no psychosis ([place]) [F32.2] 06/11/2017  . Cough [R05] 03/14/2015  . Major depressive disorder, recurrent, severe without psychotic  features ([place]) [F33.2]   . Opioid dependence with opioid-induced mood disorder ([place]) [F11.24]   . Cocaine dependence with cocaine-induced mood disorder ([place]) [F14.24]   . MDD (major depressive disorder) [F32.9] 09/17/2014  . MDD (major depressive disorder), recurrent severe, without psychosis ([place]) [F33.2] 07/05/2014  . Major depression, recurrent ([place]) [F33.9] 04/10/2012  . Cocaine dependence ([place]) [F14.20] 04/10/2012  . Polysubstance abuse including IVDA (heroin), cocaine, marijuana [F19.10] 01/10/2012  . Elevated LFTs [R94.5] 01/10/2012  . Chronic hepatitis C without hepatic coma ([place]) [B18.2] 01/10/2012  . 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 Tobacco abuse [Z72.0] 01/10/2012  . Anxiety disorder [F41.9] 08/14/2011  . Recurrent major depression-severe ([place]) [F33.2] 08/13/2011   Total Time spent with patient: 15 minutes  Past Psychiatric History:   Past Medical History:  Past Medical History:  Diagnosis Date  . Cellulitis  Of  Left Forearm 01/10/2012   From IVDA  . Depression   . Hepatitis C   . 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 I&D EXTREMITY  01/13/2012   Procedure: IRRIGATION AND DEBRIDEMENT EXTREMITY;  Surgeon: Sharmon Revere, MD;  Location: [facility];  Service: Orthopedics;  Laterality: Left;   Family History:  Family History  Problem Relation Age of Onset  . Idiopathic pulmonary fibrosis Father    Family Psychiatric  History:  Social History:  Social History   Substance and Sexual Activity  Alcohol Use Yes   Comment: occ     Social History   Substance and Sexual Activity  Drug Use Yes  . Types: Heroin, "Crack" cocaine, Marijuana   Comment:  heroin last used 07/14/14, crack/ had some a few days ago    Social History   Socioeconomic History  . Marital status: Single    Spouse name: Not on file  . Number of children: Not on file  . Years of education: Not on  file  . Highest education level: Not on file  Occupational History  . 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 Financial resource strain: Not on file  . Food insecurity:    Worry: Not on file    Inability: Not on file  . Transportation needs:    Medical: Not on file    Non-medical: Not on file  Tobacco Use  . Smoking status: Current Every Day Smoker    Packs/day: 0.50    Years: 20.00    Pack years: 10.00    Types: Cigarettes  . Smokeless tobacco: Never Used  . Tobacco comment: trying to quit-using e-cig some  Substance and Sexual Activity  . Alcohol use: Yes    Comment: occ  . Drug use: Yes    Types: Heroin, "Crack" cocaine, Marijuana    Comment: heroin last used 07/14/14, crack/ had some a few days ago  . Sexual activity: Yes    Partners: Male    Birth control/protection: Condom    Comment: Has been HIV tested in past and has been negative.  Lifestyle  . Physical activity:    Days per week: Not on file    Minutes per session: Not on file  . Stress: Not on file  Relationships  . Social connections:    Talks on phone: Not on file    Gets together: Not on file    Attends religious service: Not on file    Active member of club or organization: Not on file    Attends meetings of clubs or organizations: Not on file    Relationship status: Not on file  Other Topics Concern  . Not on file  Social History Narrative   Transgendered male.  Single.  Lives alone.  Independent.   Additional Social History:    Pain Medications: See MAR Prescriptions: See MAR Over the Counter: See MAR History of alcohol / drug use?: Yes Longest period of sobriety (when/how long): 2 years Negative Consequences of Use: Financial, Personal relationships Name of Substance 1: Opiates 1 - Age of First Use: Mid 30's 1 - Amount (size/oz): Unknown 1 - Frequency: Occasionally 1 - Duration: Ongoing 1 - Last Use / Amount: 2 weeks ago Name of Substance 2: Cocaine 2 - Age  of First Use: Early 30's 2 - Amount (size/oz): Unknown 2 - Frequency: Daily 2 - Duration: Off and on 2 - Last Use / Amount: yesterday  Sleep: Improving  Appetite:  Improving  Current Medications: Current Facility-Administered Medications  Medication Dose Route Frequency Provider Last Rate Last Dose  . acetaminophen (TYLENOL) tablet 650 mg  650 mg Oral Q6H PRN Lindon Romp A, NP      . alum & mag hydroxide-simeth (MAALOX/MYLANTA) 200-200-20 MG/5ML suspension 30 mL  30 mL Oral Q4H PRN Lindon Romp A, NP      . ARIPiprazole (ABILIFY) tablet 2 mg  2 mg Oral Daily Kynley Metzger, Myer Peer, MD   2 mg at 11/22/17 0830  . escitalopram (LEXAPRO) tablet 30 mg  30 mg Oral Daily Caedmon Louque, Myer Peer, MD   30 mg at 11/22/17 0828  . estradiol (ESTRACE) tablet 2 mg  2 mg Oral TID Rozetta Nunnery,  NP   2 mg at 11/22/17 0829  . finasteride (PROSCAR) tablet 2.5 mg  2.5 mg Oral Daily Lindon Romp A, NP   2.5 mg at 11/22/17 0827  . hydrOXYzine (ATARAX/VISTARIL) tablet 25 mg  25 mg Oral TID PRN Lindon Romp A, NP   25 mg at 11/21/17 0331  . magnesium hydroxide (MILK OF MAGNESIA) suspension 30 mL  30 mL Oral Daily PRN Lindon Romp A, NP      . mirtazapine (REMERON) tablet 7.5 mg  7.5 mg Oral QHS Tristan Proto, Myer Peer, MD   7.5 mg at 11/21/17 2138  . nicotine (NICODERM CQ - dosed in mg/24 hours) patch 21 mg  21 mg Transdermal Daily Lindon Romp A, NP   21 mg at 11/22/17 0831  . prazosin (MINIPRESS) capsule 1 mg  1 mg Oral QHS Lopez Dentinger, Myer Peer, MD   1 mg at 11/21/17 2138  . spironolactone (ALDACTONE) tablet 50 mg  50 mg Oral Daily Lindon Romp A, NP   50 mg at 11/22/17 0830    Lab Results:  No results found for this or any previous visit (from the past 47 hour(s)).  Blood Alcohol level:  Lab Results  Component Value Date   ETH <10 06/12/2017   ETH <5 62/83/1517    Metabolic Disorder Labs: Lab Results  Component Value Date   HGBA1C 5.4 11/17/2017   MPG 108 11/17/2017   MPG 105.41 06/12/2017   Lab Results   Component Value Date   PROLACTIN 14.2 11/17/2017   PROLACTIN 6.3 09/09/2016   Lab Results  Component Value Date   CHOL 152 11/17/2017   TRIG 87 11/17/2017   HDL 38 (L) 11/17/2017   CHOLHDL 4.0 11/17/2017   VLDL 17 11/17/2017   LDLCALC 97 11/17/2017   LDLCALC 91 06/12/2017    Physical Findings: AIMS: Facial and Oral Movements Muscles of Facial Expression: None, normal Lips and Perioral Area: None, normal Jaw: None, normal Tongue: None, normal,Extremity Movements Upper (arms, wrists, hands, fingers): None, normal Lower (legs, knees, ankles, toes): None, normal, Trunk Movements Neck, shoulders, hips: None, normal, Overall Severity Severity of abnormal movements (highest score from questions above): None, normal Incapacitation due to abnormal movements: None, normal Patient's awareness of abnormal movements (rate only patient's report): No Awareness, Dental Status Current problems with teeth and/or dentures?: No Does patient usually wear dentures?: No  CIWA:    COWS:     Musculoskeletal: Strength & Muscle Tone: within normal limits Gait & Station: normal Patient leans: N/A  Psychiatric Specialty Exam: Physical Exam  ROS- denies headache, no chest pain, no shortness of breath, no vomiting   Blood pressure 118/81, pulse 91, temperature 98.3 F (36.8 C), temperature source Oral, resp. rate 18, height '5\' 6"'  (1.676 m), weight 78 kg (172 lb), SpO2 98 %.Body mass index is 27.76 kg/m.  General Appearance: Fairly Groomed  Eye Contact:  Good  Speech:  Normal Rate  Volume:  Normal  Mood:  Mood seems improved today, less depressed  Affect:  Less blunted today, more reactive  Thought Process:  Linear and Descriptions of Associations: Intact  Orientation:  Full (Time, Place, and Person)  Thought Content:  no hallucinations, no delusions, not internally preoccupied   Suicidal Thoughts:  No- at this time denies suicidal or self injurious ideations, denies homicidal or violent  ideations, currently is able to contract for safety on unit   Homicidal Thoughts:  No  Memory:  recent and remote grossly intact   Judgement:  Other:  improving  Insight:  improving   Psychomotor Activity:  Decreased  Concentration:  Concentration: Good and Attention Span: Good  Recall:  Good  Fund of Knowledge:  Good  Language:  Good  Akathisia:  Negative  Handed:  Right  AIMS (if indicated):     Assets:  Communication Skills Desire for Improvement Resilience  ADL's:  Intact  Cognition:  WNL  Sleep:  Number of Hours: 6.5   Assessment-patient reports partially improved mood and today presenting with more noticeableimprovement in affect and in overall relatedness, presents with improved eye contact, improved rate of speech.  At this time denies suicidal ideations, and seems to be becoming more future oriented.  Currently on Lexapro which was recently titrated to 30 mg daily and on Remeron which was started yesterday-tolerating well thus far.   Treatment Plan Summary: Daily contact with patient to assess and evaluate symptoms and progress in treatment, Medication management, Plan inpatient treatment  and medications as below  Treatment plan reviewed as below today July 15 Encourage group and miilieu participation to work on Radiographer, therapeutic and symptom reduction Continue Abilify  2 mgrs QDAY for depression, mood disorder  Continue Lexapro 30 mgrs QDAY for depression Continue Minipress 1 mgr QHS for nightmares  Continue Vistaril 25 mgrs Q 8 hours PRN for anxiety as needed Continue Remeron 7.5 mg nightly for insomnia and for antidepressant management Treatment Team working on disposition planning  Jenne Campus, MD 11/22/2017, 1:12 PM   Patient ID: Randy [name], adult   DOB: [birth year], 47 y.o.   MRN: [id number]

## 2017-11-22 NOTE — Progress Notes (Signed)
Recreation Therapy Notes  Date: 7.15.19 Time: 0930 Location: [address] Dayroom  Group Topic: Stress Management  Goal Area(s) Addresses:  Patient will verbalize importance of using healthy stress management.  Patient will identify positive emotions associated with healthy stress management.   Intervention: Stress Management  Activity :  Guided Imagery.  LRT introduced the stress management technique of guided imagery.  LRT read a script that allowed patients to picture their peaceful place.  Patients were to follow along as the script was read to engage in the activity.  Education:  Stress Management, Discharge Planning.   Education Outcome: Acknowledges edcuation/In group clarification offered/Needs additional education  Clinical Observations/Feedback: Pt did not attend group.      Caroll RancherMarjette Tayva Easterday, LRT/CTRS         Caroll RancherLindsay, Nastasha Reising A 11/22/2017 12:37 PM

## 2017-11-23 MED ORDER — MIRTAZAPINE 7.5 MG PO TABS: 7.5000 mg | ORAL_TABLET | Freq: Every day | ORAL | 0 refills | Status: DC

## 2017-11-23 MED ORDER — HYDROXYZINE HCL 25 MG PO TABS: 25.0000 mg | ORAL_TABLET | Freq: Three times a day (TID) | ORAL | 0 refills | Status: DC | PRN

## 2017-11-23 MED ORDER — PNEUMOCOCCAL VAC POLYVALENT 25 MCG/0.5ML IJ INJ: 0.5000 mL | INJECTION | INTRAMUSCULAR | Status: DC

## 2017-11-23 MED ORDER — ESCITALOPRAM OXALATE 10 MG PO TABS: 30.0000 mg | ORAL_TABLET | Freq: Every day | ORAL | 0 refills | Status: DC

## 2017-11-23 MED ORDER — PRAZOSIN HCL 1 MG PO CAPS: 1.0000 mg | ORAL_CAPSULE | Freq: Every day | ORAL | 0 refills | Status: DC

## 2017-11-23 MED ORDER — ARIPIPRAZOLE 2 MG PO TABS: 2.0000 mg | ORAL_TABLET | Freq: Every day | ORAL | 0 refills | Status: DC

## 2017-11-23 NOTE — Progress Notes (Signed)
  [facility] :  Will you be returning to the same living situation after discharge:  Yes,  own apartment At discharge, do you have transportation home?: No. Bus pass provided. Do you have the ability to pay for your medications: Yes,  medicare  Release of information consent forms completed and in the chart;  Patient's signature needed at discharge.  Patient to Follow up at: Follow-up Information    [facility]. Go on 11/29/2017.   Specialty:  Behavioral Health Why:  Appointment for medication management and therapy services is Monday, 11/29/17 at 11:00am. Please be sure to bring a list of medications and any discharge paperwork from this hospitalization.  Contact information: [address] [place] [address] [phone]           Next level of care provider [facility] Link:no  Safety Planning and Suicide Prevention discussed: No. Pt declined consent.  Have you used any form of tobacco in the last 30 days? (Cigarettes, Smokeless Tobacco, Cigars, and/or Pipes): Yes  Has patient been referred to the Quitline?: Patient refused referral  Patient has been referred for addiction treatment: Yes  Lorri FrederickWierda, Randy Malan Jon, LCSW 11/23/2017, 3:41 PM

## 2017-11-23 NOTE — [facility] Suicide Risk Assessment (Signed)
[facility] Discharge Suicide Risk Assessment   Principal Problem: Depression Discharge Diagnoses:  Patient Active Problem List   Diagnosis Date Noted  . Severe recurrent major depression without psychotic features ([facility]) [F33.2] 11/16/2017  . Liver fibrosis [K74.0] 06/22/2017  . MDD (major depressive disorder), single episode, severe , no psychosis ([facility]) [F32.2] 06/11/2017  . Cough [R05] 03/14/2015  . Major depressive disorder, recurrent, severe without psychotic features ([facility]) [F33.2]   . Opioid dependence with opioid-induced mood disorder ([facility]) [F11.24]   . Cocaine dependence with cocaine-induced mood disorder ([facility]) [F14.24]   . MDD (major depressive disorder) [F32.9] 09/17/2014  . MDD (major depressive disorder), recurrent severe, without psychosis ([facility]) [F33.2] 07/05/2014  . Major depression, recurrent ([facility]) [F33.9] 04/10/2012  . Cocaine dependence ([facility]) [F14.20] 04/10/2012  . Polysubstance abuse including IVDA (heroin), cocaine, marijuana [F19.10] 01/10/2012  . Elevated LFTs [R94.5] 01/10/2012  . Chronic hepatitis C without hepatic coma ([facility]) [B18.2] 01/10/2012  . 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 Tobacco abuse [Z72.0] 01/10/2012  . Anxiety disorder [F41.9] 08/14/2011  . Recurrent major depression-severe ([facility]) [F33.2] 08/13/2011    Total Time spent with patient: 30 minutes  Musculoskeletal: Strength & Muscle Tone: within normal limits Gait & Station: normal Patient leans: N/A  Psychiatric Specialty Exam: ROS denies headache, no chest pain, no shortness of breath, no vomiting   Blood pressure (!) 124/96, pulse 76, temperature 97.8 F (36.6 C), temperature source Oral, resp. rate 18, height 5\' 6"  (1.676 m), weight 78 kg (172 lb), SpO2 98 %.Body mass index is 27.76 kg/m.  General Appearance: improving grooming   Eye Contact::  Good  Speech:  Normal Rate409  Volume:  Normal  Mood:  improving mood , acknowleges improving mood   Affect:  more  reactive- smiles at times appropriately   Thought Process:  Linear and Descriptions of Associations: Intact  Orientation:  Full (Time, Place, and Person)  Thought Content:  no hallucinations, no delusions, not internally preoccupied   Suicidal Thoughts:  No- denies suicidal or self injurious ideations, denies homicidal ideations  Homicidal Thoughts:  No  Memory:  recent and remote grossly intact   Judgement:  Other:  improving   Insight:  fair- improving   Psychomotor Activity:  Normal  Concentration:  Good  Recall:  Good  Fund of Knowledge:Good  Language: Good  Akathisia:  Negative  Handed:  Right  AIMS (if indicated):     Assets:  Communication Skills Desire for Improvement Resilience  Sleep:  Number of Hours: 5.75  Cognition: WNL  ADL's:  Intact   Mental Status Per Nursing Assessment::   On Admission:  Suicidal ideation indicated by patient, Suicide plan  Demographic Factors:  47 year old transgender male to male, no children, lives alone   Loss Factors: Death of SO earlier this year, recent contact with deceased SO's family, substance abuse   Historical Factors: History of prior psychiatric admissions for depression, history of self cutting.History of Cocaine Abuse .  Risk Reduction Factors:   Positive coping skills or problem solving skills  Continued Clinical Symptoms:  Alert, attentive, well related , pleasant, describes improving mood, and presents with improving range of affect, improved rate of speech, denies suicidal or self injurious ideations, no homicidal ideations, no hallucinations, no delusions, not internally preoccupied, future oriented . States sleep has improved noticeably and feels that with improving quality of sleep mood has improved noticeably as well  Denies medication side effects.  Visible on unit, pleasant, polite on approach  Cognitive  Features That Contribute To Risk:  No gross cognitive deficits noted upon discharge. Is alert ,  attentive, and oriented x 3   Suicide Risk:  Mild:  Suicidal ideation of limited frequency, intensity, duration, and specificity.  There are no identifiable plans, no associated intent, mild dysphoria and related symptoms, good self-control (both objective and subjective assessment), few other risk factors, and identifiable protective factors, including available and accessible social support.  Follow-up Information    [facility]. Go on 11/29/2017.   Specialty:  Behavioral Health Why:  Appointment for medication management and therapy services is Monday, 11/29/17 at 11:00am. Please be sure to bring a list of medications and any discharge paperwork from this hospitalization.  Contact information: [address] [place] [address] [phone]           Plan Of Care/Follow-up recommendations:  Activity:  as tolerated  Diet:  regular Tests:  NA Other:  See below  Patient expresses feeling ready for discharge- no grounds for involuntary commitment at this time. Plans to return home [facility], [facility] as needed   Randy CottaFernando A Jovie Swanner, MD 11/23/2017, 9:45 AM

## 2017-11-23 NOTE — Plan of Care (Signed)
D: Patient presents calm, soft-spoken, cooperative. She slept well last night, and did not receive medication to help with sleep. Her appetite is fair, energy low, and concentration good. She rates her depression 5/10, feeling of hopelessness 4/10 and anxiety 3/10. She denies physical symptoms or withdrawal. Pt A & O X 3. Denies SI, HI, AVH and pain at this time. D/C home as ordered. A: D/C instructions reviewed with pt including prescriptions, medication samples and follow up appointment, compliance encouraged. All belongings from locker given to pt at time of departure. Scheduled and PRN medications given with verbal education and effects monitored. Safety checks maintained without incident till time of d/c.  R: Pt receptive to care. Compliant with medications when offered. Denies adverse drug reactions when assessed. Verbalized understanding related to d/c instructions. Signed belonging sheet in agreement with items received from locker. Ambulatory with a steady gait. Appears to be in no physical distress at time of departure.

## 2017-11-23 NOTE — Progress Notes (Signed)
Patient ID: Randy [name], adult   DOB: May 20, 1970, 47 y.o.   MRN: [id number] Patient was discharged to home/self care on her own accord.  Patient acknowledged understanding of all discharge instructions and receipt of all personal belongings.  Patient was in a bright mood upon discharge and expressed the desire to follow up with treatment upon discharge.

## 2017-11-23 NOTE — Discharge Summary (Addendum)
Physician Discharge Summary Note  Patient:  Randy [name] is an 47 y.o., adult MRN:  [id number] DOB:  [birth year] Patient phone:  [phone] (home)  Patient address:   [address] [place] [address],  Total Time spent with patient: 20 minutes  Date of Admission:  11/16/2017 Date of Discharge: 11/23/2017   Reason for Admission:  47 year old transgender male Randy [name]) . Presented to the hospital voluntarily due to worsening depression and suicidal ideations. States her BF [name]  passed away in 26-May-2022 of this year from an overdose she thinks may have been intentional. States she was recently told she could " pick up his ashes from his sister's". States that this is when she " started feeling a lot more depressed" ( states she had already been feeling depressed before then but to lesser extent ) .  Reports neuro -vegetative symptoms of depression as below. Denies psychotic symptoms. Reports history of cocaine use disorder, and reports has been using more frequently/regularly over recent weeks. Admission  UDS positive for cocaine, admission UDS negative . Of note, he has a history of prior psychiatric admissions, most recently in February 2019, at which  time he was discharged on Abilify ,Lexapro, Minipress, Trazodone  Principal Problem: <principal problem not specified> Discharge Diagnoses: Patient Active Problem List   Diagnosis Date Noted  . Severe recurrent major depression without psychotic features (HCC) [F33.2] 11/16/2017  . Liver fibrosis [K74.0] 06/22/2017  . MDD (major depressive disorder), single episode, severe , no psychosis (HCC) [F32.2] 06/11/2017  . Cough [R05] 03/14/2015  . Major depressive disorder, recurrent, severe without psychotic features (HCC) [F33.2]   . Opioid dependence with opioid-induced mood disorder (HCC) [F11.24]   . Cocaine dependence with cocaine-induced mood disorder (HCC) [F14.24]   . MDD (major depressive disorder) [F32.9] 09/17/2014  . MDD  (major depressive disorder), recurrent severe, without psychosis (HCC) [F33.2] 07/05/2014  . Major depression, recurrent (HCC) [F33.9] 04/10/2012  . Cocaine dependence (HCC) [F14.20] 04/10/2012  . Polysubstance abuse including IVDA (heroin), cocaine, marijuana [F19.10] 01/10/2012  . Elevated LFTs [R94.5] 01/10/2012  . Chronic hepatitis C without hepatic coma (HCC) [B18.2] 01/10/2012  . 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 Tobacco abuse [Z72.0] 01/10/2012  . Anxiety disorder [F41.9] 08/14/2011  . Recurrent major depression-severe (HCC) [F33.2] 08/13/2011    Past Psychiatric History:   Past Medical History:  Past Medical History:  Diagnosis Date  . Cellulitis  Of  Left Forearm 01/10/2012   From IVDA  . Depression   . Hepatitis C   . 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 I&D EXTREMITY  01/13/2012   Procedure: IRRIGATION AND DEBRIDEMENT EXTREMITY;  Surgeon: Kennieth Rad, MD;  Location: [facility];  Service: Orthopedics;  Laterality: Left;   Family History:  Family History  Problem Relation Age of Onset  . Idiopathic pulmonary fibrosis Father    Family Psychiatric  History:  Social History:  Social History   Substance and Sexual Activity  Alcohol Use Yes   Comment: occ     Social History   Substance and Sexual Activity  Drug Use Yes  . Types: Heroin, "Crack" cocaine, Marijuana   Comment: heroin last used 07/14/14, crack/ had some a few days ago    Social History   Socioeconomic History  . Marital status: Single    Spouse name: Not on file  . Number of children: Not on file  .  Years of education: Not on file  . Highest education level: Not on file  Occupational History  . 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 Financial resource strain: Not on file  . Food insecurity:    Worry: Not on file    Inability: Not on file  . Transportation  needs:    Medical: Not on file    Non-medical: Not on file  Tobacco Use  . Smoking status: Current Every Day Smoker    Packs/day: 0.50    Years: 20.00    Pack years: 10.00    Types: Cigarettes  . Smokeless tobacco: Never Used  . Tobacco comment: trying to quit-using e-cig some  Substance and Sexual Activity  . Alcohol use: Yes    Comment: occ  . Drug use: Yes    Types: Heroin, "Crack" cocaine, Marijuana    Comment: heroin last used 07/14/14, crack/ had some a few days ago  . Sexual activity: Yes    Partners: Male    Birth control/protection: Condom    Comment: Has been HIV tested in past and has been negative.  Lifestyle  . Physical activity:    Days per week: Not on file    Minutes per session: Not on file  . Stress: Not on file  Relationships  . Social connections:    Talks on phone: Not on file    Gets together: Not on file    Attends religious service: Not on file    Active member of club or organization: Not on file    Attends meetings of clubs or organizations: Not on file    Relationship status: Not on file  Other Topics Concern  . Not on file  Social History Narrative   Transgendered male.  Single.  Lives alone.  Independent.    Hospital Course:  Maxwell Caulmily Bree was admitted for Severe recurrent major depression without psychotic features (HCC)  and crisis management.  Pt was treated discharged with the medications listed below under Medication List.  Medical problems were identified and treated as needed.  Home medications were restarted as appropriate.  Improvement was monitored by observation and Maxwell CaulEmily Sibley 's daily report of symptom reduction.  Emotional and mental status was monitored by daily self-inventory reports completed by Maxwell CaulEmily Boehringer and clinical staff.         Maxwell Caulmily Worlds was evaluated by the treatment team for stability and plans for continued recovery upon discharge. Maxwell Caulmily Flaharty 's motivation was an integral factor for scheduling further treatment.  Employment, transportation, bed availability, health status, family support, and any pending legal issues were also considered during hospital stay. Pt was offered further treatment options upon discharge including but not limited to Residential, Intensive Outpatient, and Outpatient treatment.  Maxwell Caulmily Mcnaught will follow up with the services as listed below under Follow Up Information.     Upon completion of this admission the patient was both mentally and medically stable for discharge denying suicidal/homicidal ideation, auditory/visual/tactile hallucinations, delusional thoughts and paranoia.     Maxwell CaulEmily Hillery responded well to treatment with Abilify and  Lexapro without adverse effects. Initially, pt did complain of headache and nausea  with these medications, but this resolved. Pt demonstrated improvement without reported or observed adverse effects to the point of stability appropriate for outpatient management. Pertinent labs include: lipid panel, CMp  for which outpatient follow-up is necessary for lab recheck as mentioned below. Reviewed CBC, CMP, BAL, and UDS + cocain ; all unremarkable aside  from noted exceptions.   Physical Findings: AIMS: Facial and Oral Movements Muscles of Facial Expression: None, normal Lips and Perioral Area: None, normal Jaw: None, normal Tongue: None, normal,Extremity Movements Upper (arms, wrists, hands, fingers): None, normal Lower (legs, knees, ankles, toes): None, normal, Trunk Movements Neck, shoulders, hips: None, normal, Overall Severity Severity of abnormal movements (highest score from questions above): None, normal Incapacitation due to abnormal movements: None, normal Patient's awareness of abnormal movements (rate only patient's report): No Awareness, Dental Status Current problems with teeth and/or dentures?: No Does patient usually wear dentures?: No  CIWA:  CIWA-Ar Total: 0 COWS:  COWS Total Score: 0  Musculoskeletal: Strength & Muscle Tone:  within normal limits Gait & Station: normal Patient leans: N/A  Psychiatric Specialty Exam: See SRA by MD  Physical Exam  ROS  Blood pressure (!) 124/96, pulse 76, temperature 97.8 F (36.6 C), temperature source Oral, resp. rate 18, height 5\' 6"  (1.676 m), weight 78 kg (172 lb), SpO2 98 %.Body mass index is 27.76 kg/m.    Have you used any form of tobacco in the last 30 days? (Cigarettes, Smokeless Tobacco, Cigars, and/or Pipes): Yes  Has this patient used any form of tobacco in the last 30 days? (Cigarettes, Smokeless Tobacco, Cigars, and/or Pipes)  Yes, A prescription for an FDA-approved tobacco cessation medication was offered at discharge and the patient refused  Blood Alcohol level:  Lab Results  Component Value Date   [facility] <10 06/12/2017   ETH <5 09/07/2016    Metabolic Disorder Labs:  Lab Results  Component Value Date   HGBA1C 5.4 11/17/2017   MPG 108 11/17/2017   MPG 105.41 06/12/2017   Lab Results  Component Value Date   PROLACTIN 14.2 11/17/2017   PROLACTIN 6.3 09/09/2016   Lab Results  Component Value Date   CHOL 152 11/17/2017   TRIG 87 11/17/2017   HDL 38 (L) 11/17/2017   CHOLHDL 4.0 11/17/2017   VLDL 17 11/17/2017   LDLCALC 97 11/17/2017   LDLCALC 91 06/12/2017    See Psychiatric Specialty Exam and Suicide Risk Assessment completed by Attending Physician prior to discharge.  Discharge destination:  Home  Is patient on multiple antipsychotic therapies at discharge:  No   Has Patient had three or more failed trials of antipsychotic monotherapy by history:  No  Recommended Plan for Multiple Antipsychotic Therapies: NA  Discharge Instructions    Diet - low sodium heart healthy   Complete by:  As directed    Discharge instructions   Complete by:  As directed    Take all medications as prescribed. Keep all follow-up appointments as scheduled.  Do not consume alcohol or use illegal drugs while on prescription medications. Report any adverse  effects from your medications to your primary care provider promptly.  In the event of recurrent symptoms or worsening symptoms, call 911, a crisis hotline, or go to the nearest emergency department for evaluation.   Increase activity slowly   Complete by:  As directed      Allergies as of 11/23/2017      Reactions   Penicillins Other (See Comments)   Has patient had a PCN reaction causing immediate rash, facial/tongue/throat swelling, SOB or lightheadedness with hypotension: no Has patient had a PCN reaction causing severe rash involving mucus membranes or skin necrosis: No Has patient had a PCN reaction that required hospitalization: No Has patient had a PCN reaction occurring within the last 10 years: Yes If all of the above answers are "  NO", then may proceed with Cephalosporin use. Swollen Joints      Medication List    STOP taking these medications   traZODone 50 MG tablet Commonly known as:  DESYREL     TAKE these medications     Indication  ARIPiprazole 2 MG tablet Commonly known as:  ABILIFY Take 1 tablet (2 mg total) by mouth daily. Start taking on:  11/24/2017 What changed:    medication strength  how much to take  additional instructions  Indication:  Mood control   escitalopram 10 MG tablet Commonly known as:  LEXAPRO Take 3 tablets (30 mg total) by mouth daily. Start taking on:  11/24/2017 What changed:    medication strength  how much to take  additional instructions  Indication:  Generalized Anxiety Disorder, Major Depressive Disorder   estradiol 2 MG tablet Commonly known as:  ESTRACE Take 1 tablet (2 mg total) by mouth 3 (three) times daily. For hormonal supplementation  Indication:  Deficiency of the Hormone Estrogen   finasteride 5 MG tablet Commonly known as:  PROSCAR Take 0.5 tablets (2.5 mg total) by mouth daily. For hormonal treatment for male transgender process: Take 1/2 tablet  Indication:  Part of  hormonal therapy for  transgendering   hydrOXYzine 25 MG tablet Commonly known as:  ATARAX/VISTARIL Take 1 tablet (25 mg total) by mouth 3 (three) times daily as needed for anxiety. What changed:  when to take this  Indication:  Feeling Anxious   Ledipasvir-Sofosbuvir 90-400 MG Tabs Commonly known as:  HARVONI Take 1 tablet by mouth daily.  Indication:  Chronic Hepatitis C   mirtazapine 7.5 MG tablet Commonly known as:  REMERON Take 1 tablet (7.5 mg total) by mouth at bedtime.  Indication:  Major Depressive Disorder   nicotine 21 mg/24hr patch Commonly known as:  NICODERM CQ - dosed in mg/24 hours Place 1 patch (21 mg total) onto the skin daily. (May purchase from over the counter): For smoking cessation  Indication:  Nicotine Addiction   prazosin 1 MG capsule Commonly known as:  MINIPRESS Take 1 capsule (1 mg total) by mouth at bedtime. What changed:    medication strength  how much to take  additional instructions  Indication:  Nightmares   spironolactone 50 MG tablet Commonly known as:  ALDACTONE Take 1 tablet (50 mg total) by mouth daily. Part treatment for transgender process.  Indication:  Part treatment for transgender process.      Follow-up Information    [facility] Go on 11/29/2017.   Specialty:  Behavioral Health Why:  Appointment for medication management and therapy services is Monday, 11/29/17 at 11:00am. Please be sure to bring a list of medications and any discharge paperwork from this hospitalization.  [address] [place] [address] [phone]           Follow-up recommendations:  Activity:  as tolerated Diet:  heart healthy  Comments:  Take all medications as prescribed. Keep all follow-up appointments as scheduled.  Do not consume alcohol or use illegal drugs while on prescription medications. Report any adverse effects from your medications to your primary care provider promptly.  In the event of recurrent symptoms or worsening  symptoms, call 911, a crisis hotline, or go to the nearest emergency department for evaluation.   Signed: Oneta Rack, NP 11/23/2017, 10:25 AM   Patient seen, Suicide Assessment Completed.  Disposition Plan Reviewed

## 2017-11-23 NOTE — [facility] Suicide Risk Assessment (Signed)
[facility] INPATIENT:  Family/Significant Other Suicide Prevention Education  Suicide Prevention Education:  Patient Refusal for Family/Significant Other Suicide Prevention Education: The patient Randy [name] has refused to provide written consent for family/significant other to be provided Family/Significant Other Suicide Prevention Education during admission and/or prior to discharge.  Physician notified.  Lorri FrederickWierda, Deshawnda Acrey Jon 11/23/2017, 9:27 AM

## 2017-11-26 ENCOUNTER — Encounter

## 2017-12-10 ENCOUNTER — Telehealth: Payer: Self-pay | Admitting: Pharmacist

## 2017-12-10 NOTE — Telephone Encounter (Signed)
Patient left a VM with triage asking about her Harvoni prescription.  She [facility] and is asking about affording her Harvoni brand vs generic.  I gave her a call and told her that Kathie RhodesBetty and I would review her profile on Monday and start the process/get her co-pay assistance and start her on the medication.  Will follow-up on Monday.

## 2017-12-11 NOTE — Telephone Encounter (Signed)
Yes, great  

## 2017-12-17 ENCOUNTER — Telehealth: Payer: Self-pay | Admitting: Internal Medicine

## 2017-12-17 NOTE — Telephone Encounter (Signed)
Called pt. To reschedule appt. On 03/14/18. Rescheduled °

## 2017-12-22 ENCOUNTER — Ambulatory Visit (INDEPENDENT_AMBULATORY_CARE_PROVIDER_SITE_OTHER): Payer: Medicare Other | Admitting: Pharmacist

## 2017-12-22 DIAGNOSIS — Z206 Contact with and (suspected) exposure to human immunodeficiency virus [HIV]: Secondary | ICD-10-CM | POA: Diagnosis not present

## 2017-12-22 DIAGNOSIS — B182 Chronic viral hepatitis C: Secondary | ICD-10-CM | POA: Diagnosis not present

## 2017-12-22 DIAGNOSIS — Z7251 High risk heterosexual behavior: Secondary | ICD-10-CM | POA: Diagnosis not present

## 2017-12-22 NOTE — Progress Notes (Signed)
HPI: Randy [name] is a 47 y.o. adult who presents to the [facility] pharmacy clinic for labs and Hepatitis C follow-up.  Hepatitis C Genotype: 1a  Fibrosis Score: F2/F3  Hepatitis C RNA: 7.7 million  Patient Active Problem List   Diagnosis Date Noted  . Severe recurrent major depression without psychotic features (HCC) 11/16/2017  . Liver fibrosis 06/22/2017  . MDD (major depressive disorder), single episode, severe , no psychosis (HCC) 06/11/2017  . Cough 03/14/2015  . Major depressive disorder, recurrent, severe without psychotic features (HCC)   . Opioid dependence with opioid-induced mood disorder (HCC)   . Cocaine dependence with cocaine-induced mood disorder (HCC)   . MDD (major depressive disorder) 09/17/2014  . MDD (major depressive disorder), recurrent severe, without psychosis (HCC) 07/05/2014  . Major depression, recurrent (HCC) 04/10/2012  . Cocaine dependence (HCC) 04/10/2012  . Polysubstance abuse including IVDA (heroin), cocaine, marijuana 01/10/2012  . Elevated LFTs 01/10/2012  . Chronic hepatitis C without hepatic coma (HCC) 01/10/2012  . 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 Tobacco abuse 01/10/2012  . Anxiety disorder 08/14/2011  . Recurrent major depression-severe (HCC) 08/13/2011    Patient's Medications  New Prescriptions   No medications on file  Previous Medications   ARIPIPRAZOLE (ABILIFY) 2 MG TABLET    Take 1 tablet (2 mg total) by mouth daily.   ESCITALOPRAM (LEXAPRO) 10 MG TABLET    Take 3 tablets (30 mg total) by mouth daily.   ESTRADIOL (ESTRACE) 2 MG TABLET    Take 1 tablet (2 mg total) by mouth 3 (three) times daily. For hormonal supplementation   FINASTERIDE (PROSCAR) 5 MG TABLET    Take 0.5 tablets (2.5 mg total) by mouth daily. For hormonal treatment for male transgender process: Take 1/2 tablet   HYDROXYZINE (ATARAX/VISTARIL) 25 MG TABLET    Take 1 tablet (25 mg total) by mouth 3 (three) times daily as needed for anxiety.   LEDIPASVIR-SOFOSBUVIR (HARVONI) 90-400 MG TABS    Take 1 tablet by mouth daily.   MIRTAZAPINE (REMERON) 7.5 MG TABLET    Take 1 tablet (7.5 mg total) by mouth at bedtime.   NICOTINE (NICODERM CQ - DOSED IN MG/24 HOURS) 21 MG/24HR PATCH    Place 1 patch (21 mg total) onto the skin daily. (May purchase from over the counter): For smoking cessation   PRAZOSIN (MINIPRESS) 1 MG CAPSULE    Take 1 capsule (1 mg total) by mouth at bedtime.   SPIRONOLACTONE (ALDACTONE) 50 MG TABLET    Take 1 tablet (50 mg total) by mouth daily. Part treatment for transgender process.  Modified Medications   No medications on file  Discontinued Medications   No medications on file    Allergies: Allergies  Allergen Reactions  . Penicillins Other (See Comments)    Has patient had a PCN reaction causing immediate rash, facial/tongue/throat swelling, SOB or lightheadedness with hypotension: no Has patient had a PCN reaction causing severe rash involving mucus membranes or skin necrosis: No Has patient had a PCN reaction that required hospitalization: No Has patient had a PCN reaction occurring within the last 10 years: Yes If all of the above answers are "NO", then may proceed with Cephalosporin use.  Swollen Joints    Past Medical History: Past Medical History:  Diagnosis Date  . Cellulitis  Of  Left Forearm 01/10/2012   From IVDA  . Depression   . Hepatitis C   . Hormonal imbalance in transgender patient 01/10/2012    Social History:  Social History   Socioeconomic History  . Marital status: Single    Spouse name: Not on file  . Number of children: Not on file  . Years of education: Not on file  . Highest education level: Not on file  Occupational History  . 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 Financial resource strain: Not on file  . Food insecurity:    Worry: Not on file    Inability: Not on file  . Transportation needs:    Medical: Not on file    Non-medical: Not  on file  Tobacco Use  . Smoking status: Current Every Day Smoker    Packs/day: 0.50    Years: 20.00    Pack years: 10.00    Types: Cigarettes  . Smokeless tobacco: Never Used  . Tobacco comment: trying to quit-using e-cig some  Substance and Sexual Activity  . Alcohol use: Yes    Comment: occ  . Drug use: Yes    Types: Heroin, "Crack" cocaine, Marijuana    Comment: heroin last used 07/14/14, crack/ had some a few days ago  . Sexual activity: Yes    Partners: Male    Birth control/protection: Condom    Comment: Has been HIV tested in past and has been negative.  Lifestyle  . Physical activity:    Days per week: Not on file    Minutes per session: Not on file  . Stress: Not on file  Relationships  . Social connections:    Talks on phone: Not on file    Gets together: Not on file    Attends religious service: Not on file    Active member of club or organization: Not on file    Attends meetings of clubs or organizations: Not on file    Relationship status: Not on file  Other Topics Concern  . Not on file  Social History Narrative   Transgendered male.  Single.  Lives alone.  Independent.    Labs: Hepatitis C Lab Results  Component Value Date   HCVGENOTYPE 1a 04/15/2017   HCVRNAPCRQN 7,720,000 (H) 04/15/2017   FIBROSTAGE F1-F2 04/15/2017   Hepatitis B Lab Results  Component Value Date   HEPBSAB BORDERLINE (A) 04/15/2017   HEPBSAG NON-REACTIVE 04/15/2017   HEPBCAB NON-REACTIVE 04/15/2017   Hepatitis A Lab Results  Component Value Date   HAV REACTIVE (A) 04/15/2017   HIV Lab Results  Component Value Date   HIV NON-REACTIVE 04/15/2017   HIV NONREACTIVE 10/20/2013   HIV NON REACTIVE 06/29/2012   HIV NON REACTIVE 01/10/2012   HIV NON REACTIVE 08/03/2009   Lab Results  Component Value Date   CREATININE 1.10 11/16/2017   CREATININE 0.95 06/12/2017   CREATININE 1.12 04/15/2017   CREATININE 0.95 09/07/2016   CREATININE 1.19 11/18/2014   Lab Results    Component Value Date   AST 32 11/16/2017   AST 29 06/12/2017   AST 28 04/15/2017   ALT 62 (H) 11/16/2017   ALT 38 06/12/2017   ALT 51 (H) 04/15/2017   ALT 50 (H) 04/15/2017   INR 1.0 04/15/2017   INR 0.91 11/13/2013   INR 1.09 01/13/2012    Assessment: Patient is here today to see Kathie RhodesBetty to sign [facility] paperwork and to repeat labs for her Hepatitis C.  We will get a Hep C viral load, CMET, and CBC today.  Patient requests HIV testing as well, so I will do that too. She has no Medicare insurance  anymore, so we will get Epclusa through [facility].   Plan: - Paperwork and labs - Will call when approved  Jarrah Babich L. Essa Malachi, PharmD, AAHIVP, CPP Infectious Diseases Clinical Pharmacist [facility] 12/22/2017, 3:54 PM

## 2017-12-24 LAB — COMPREHENSIVE METABOLIC PANEL
AG Ratio: 1.4 (calc) (ref 1.0–2.5)
ALBUMIN MSPROF: 4.3 g/dL (ref 3.6–5.1)
ALKALINE PHOSPHATASE (APISO): 61 U/L (ref 40–115)
ALT: 90 U/L — ABNORMAL HIGH (ref 9–46)
AST: 56 U/L — AB (ref 10–40)
BUN: 14 mg/dL (ref 7–25)
CO2: 27 mmol/L (ref 20–32)
CREATININE: 1.21 mg/dL (ref 0.60–1.35)
Calcium: 9.5 mg/dL (ref 8.6–10.3)
Chloride: 106 mmol/L (ref 98–110)
GLUCOSE: 92 mg/dL (ref 65–99)
Globulin: 3.1 g/dL (calc) (ref 1.9–3.7)
Potassium: 4.2 mmol/L (ref 3.5–5.3)
Sodium: 139 mmol/L (ref 135–146)
Total Bilirubin: 0.5 mg/dL (ref 0.2–1.2)
Total Protein: 7.4 g/dL (ref 6.1–8.1)

## 2017-12-24 LAB — CBC
HCT: 49.2 % (ref 38.5–50.0)
Hemoglobin: 16.8 g/dL (ref 13.2–17.1)
MCH: 31.8 pg (ref 27.0–33.0)
MCHC: 34.1 g/dL (ref 32.0–36.0)
MCV: 93 fL (ref 80.0–100.0)
MPV: 12.9 fL — AB (ref 7.5–12.5)
PLATELETS: 126 10*3/uL — AB (ref 140–400)
RBC: 5.29 10*6/uL (ref 4.20–5.80)
RDW: 13 % (ref 11.0–15.0)
WBC: 5.9 10*3/uL (ref 3.8–10.8)

## 2017-12-24 LAB — HEPATITIS C RNA QUANTITATIVE
HCV QUANT LOG: 6.92 {Log_IU}/mL — AB
HCV RNA, PCR, QN: 8260000 [IU]/mL — AB

## 2017-12-24 LAB — HIV ANTIBODY (ROUTINE TESTING W REFLEX): HIV: NONREACTIVE

## 2017-12-28 ENCOUNTER — Encounter: Payer: Self-pay | Admitting: Pharmacy Technician

## 2017-12-28 ENCOUNTER — Other Ambulatory Visit: Payer: Self-pay | Admitting: Pharmacist Clinician (PhC)/ Clinical Pharmacy Specialist

## 2017-12-28 MED ORDER — LEDIPASVIR-SOFOSBUVIR 90-400 MG PO TABS: 1.0000 | ORAL_TABLET | Freq: Every day | ORAL | 2 refills | Status: DC

## 2017-12-28 NOTE — Progress Notes (Signed)
Rx for Harvoni for [facility].

## 2018-02-16 ENCOUNTER — Telehealth: Payer: Self-pay | Admitting: Pharmacist

## 2018-02-16 DIAGNOSIS — Z789 Other specified health status: Secondary | ICD-10-CM

## 2018-02-16 DIAGNOSIS — F640 Transsexualism: Secondary | ICD-10-CM

## 2018-02-16 MED ORDER — ESTRADIOL 2 MG PO TABS: 2.0000 mg | ORAL_TABLET | Freq: Three times a day (TID) | ORAL | 0 refills | Status: DC

## 2018-02-16 MED ORDER — FINASTERIDE 5 MG PO TABS: 2.5000 mg | ORAL_TABLET | Freq: Every day | ORAL | 0 refills | Status: DC

## 2018-02-16 MED ORDER — SPIRONOLACTONE 50 MG PO TABS: 50.0000 mg | ORAL_TABLET | Freq: Every day | ORAL | 0 refills | Status: DC

## 2018-02-16 NOTE — Telephone Encounter (Signed)
Patient has been approved for Harvoni x 12 weeks for Hep C infection.  Counseled her on how to take Harvoni and the importance of adherence.  Counseled on possible side effects such as headaches, nausea, and fatigue. Told her that she will need 2 refills total and to make sure she takes all 3 months.   Of note, she has run out of her hormonal replacement therapy because her physician has retired and she has a new provider appt on 11/7. I agreed to refill her finasteride, spironolactone, and estradiol for 30 days (0 refills) to last her until she can be seen. She will f/u with me at the end of the month.

## 2018-02-17 ENCOUNTER — Encounter: Payer: Self-pay | Admitting: Pharmacy Technician

## 2018-03-09 ENCOUNTER — Ambulatory Visit (INDEPENDENT_AMBULATORY_CARE_PROVIDER_SITE_OTHER): Payer: Medicare Other | Admitting: Pharmacist

## 2018-03-09 DIAGNOSIS — B182 Chronic viral hepatitis C: Secondary | ICD-10-CM | POA: Diagnosis not present

## 2018-03-09 NOTE — Progress Notes (Signed)
HPI: Randy [name] is a 47 y.o. adult with Hepatitis C presenting to pick up her 2nd month of Harvoni.   Medication: Harvoni x 12 wk  Start Date: 02/09/18  Hepatitis C Genotype: 1a  Fibrosis Score: F2/3  Hepatitis C RNA: 8.23 million (12/22/17)  Patient Active Problem List   Diagnosis Date Noted  . Severe recurrent major depression without psychotic features (HCC) 11/16/2017  . Liver fibrosis 06/22/2017  . MDD (major depressive disorder), single episode, severe , no psychosis (HCC) 06/11/2017  . Cough 03/14/2015  . Major depressive disorder, recurrent, severe without psychotic features (HCC)   . Opioid dependence with opioid-induced mood disorder (HCC)   . Cocaine dependence with cocaine-induced mood disorder (HCC)   . MDD (major depressive disorder) 09/17/2014  . MDD (major depressive disorder), recurrent severe, without psychosis (HCC) 07/05/2014  . Major depression, recurrent (HCC) 04/10/2012  . Cocaine dependence (HCC) 04/10/2012  . Polysubstance abuse including IVDA (heroin), cocaine, marijuana 01/10/2012  . Elevated LFTs 01/10/2012  . Chronic hepatitis C without hepatic coma (HCC) 01/10/2012  . 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 Tobacco abuse 01/10/2012  . Anxiety disorder 08/14/2011  . Recurrent major depression-severe (HCC) 08/13/2011    Patient's Medications  New Prescriptions   No medications on file  Previous Medications   ARIPIPRAZOLE (ABILIFY) 2 MG TABLET    Take 1 tablet (2 mg total) by mouth daily.   ESCITALOPRAM (LEXAPRO) 10 MG TABLET    Take 3 tablets (30 mg total) by mouth daily.   ESTRADIOL (ESTRACE) 2 MG TABLET    Take 1 tablet (2 mg total) by mouth 3 (three) times daily. For hormonal supplementation   FINASTERIDE (PROSCAR) 5 MG TABLET    Take 0.5 tablets (2.5 mg total) by mouth daily. For hormonal treatment for male transgender process: Take 1/2 tablet   HYDROXYZINE (ATARAX/VISTARIL) 25 MG TABLET    Take 1 tablet (25 mg total) by  mouth 3 (three) times daily as needed for anxiety.   LEDIPASVIR-SOFOSBUVIR (HARVONI) 90-400 MG TABS    Take 1 tablet by mouth daily.   MIRTAZAPINE (REMERON) 7.5 MG TABLET    Take 1 tablet (7.5 mg total) by mouth at bedtime.   NICOTINE (NICODERM CQ - DOSED IN MG/24 HOURS) 21 MG/24HR PATCH    Place 1 patch (21 mg total) onto the skin daily. (May purchase from over the counter): For smoking cessation   PRAZOSIN (MINIPRESS) 1 MG CAPSULE    Take 1 capsule (1 mg total) by mouth at bedtime.   SPIRONOLACTONE (ALDACTONE) 50 MG TABLET    Take 1 tablet (50 mg total) by mouth daily. Part treatment for transgender process.  Modified Medications   No medications on file  Discontinued Medications   No medications on file    Allergies: Allergies  Allergen Reactions  . Penicillins Other (See Comments)    Has patient had a PCN reaction causing immediate rash, facial/tongue/throat swelling, SOB or lightheadedness with hypotension: no Has patient had a PCN reaction causing severe rash involving mucus membranes or skin necrosis: No Has patient had a PCN reaction that required hospitalization: No Has patient had a PCN reaction occurring within the last 10 years: Yes If all of the above answers are "NO", then may proceed with Cephalosporin use.  Swollen Joints    Past Medical History: Past Medical History:  Diagnosis Date  . Cellulitis  Of  Left Forearm 01/10/2012   From IVDA  . Depression   . Hepatitis C   .  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 Marital status: Single    Spouse name: Not on file  . Number of children: Not on file  . Years of education: Not on file  . Highest education level: Not on file  Occupational History  . 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 Financial resource strain: Not on file  . Food insecurity:    Worry: Not on file    Inability: Not on file  .  Transportation needs:    Medical: Not on file    Non-medical: Not on file  Tobacco Use  . Smoking status: Current Every Day Smoker    Packs/day: 0.50    Years: 20.00    Pack years: 10.00    Types: Cigarettes  . Smokeless tobacco: Never Used  . Tobacco comment: trying to quit-using e-cig some  Substance and Sexual Activity  . Alcohol use: Yes    Comment: occ  . Drug use: Yes    Types: Heroin, "Crack" cocaine, Marijuana    Comment: heroin last used 07/14/14, crack/ had some a few days ago  . Sexual activity: Yes    Partners: Male    Birth control/protection: Condom    Comment: Has been HIV tested in past and has been negative.  Lifestyle  . Physical activity:    Days per week: Not on file    Minutes per session: Not on file  . Stress: Not on file  Relationships  . Social connections:    Talks on phone: Not on file    Gets together: Not on file    Attends religious service: Not on file    Active member of club or organization: Not on file    Attends meetings of clubs or organizations: Not on file    Relationship status: Not on file  Other Topics Concern  . Not on file  Social History Narrative   Transgendered male.  Single.  Lives alone.  Independent.    Labs: Hepatitis C Lab Results  Component Value Date   HCVGENOTYPE 1a 04/15/2017   HCVRNAPCRQN 8,260,000 (H) 12/22/2017   HCVRNAPCRQN 7,720,000 (H) 04/15/2017   FIBROSTAGE F1-F2 04/15/2017   Hepatitis B Lab Results  Component Value Date   HEPBSAB BORDERLINE (A) 04/15/2017   HEPBSAG NON-REACTIVE 04/15/2017   HEPBCAB NON-REACTIVE 04/15/2017   Hepatitis A Lab Results  Component Value Date   HAV REACTIVE (A) 04/15/2017   HIV Lab Results  Component Value Date   HIV NON-REACTIVE 12/22/2017   HIV NON-REACTIVE 04/15/2017   HIV NONREACTIVE 10/20/2013   HIV NON REACTIVE 06/29/2012   HIV NON REACTIVE 01/10/2012   Lab Results  Component Value Date   CREATININE 1.21 12/22/2017   CREATININE 1.10 11/16/2017    CREATININE 0.95 06/12/2017   CREATININE 1.12 04/15/2017   CREATININE 0.95 09/07/2016   Lab Results  Component Value Date   AST 56 (H) 12/22/2017   AST 32 11/16/2017   AST 29 06/12/2017   ALT 90 (H) 12/22/2017   ALT 62 (H) 11/16/2017   ALT 38 06/12/2017   INR 1.0 04/15/2017   INR 0.91 11/13/2013   INR 1.09 01/13/2012    Assessment: Randy [name] presents today to pick up her second month of Harvoni (initiated 02/09/18, provided by [facility]). Patient reports that she is able to tolerate the medication, reporting some headaches towards the beginning of initiation of therapy that have since resolved. She  reports that she has not missed any doses to date. Encouraged patient not to miss any doses and explained how cure rates are directly linked to adherence. An appointment has been made with Kathie Rhodes for 04/04/18 so that she can pick up her last month of treatment. She will then follow up with Cassie on 05/16/18 after completion of treatment.   Additionally, patient has been provided with prescriptions for a 30 day supply of finasteride, estradiol, and spironolactone so that she can continue her hormone replacement therapy until she can start seeing her new provider (appointment with [facility] on 03/15/18).    Plan: - Labs: CMET, HCV RNA - Follow up with Kathie Rhodes on 04/04/18 @4PM  to pick up the last month of Harvoni  - Follow up with Cassie on 05/16/18 @4PM  for EOT visit  Amanda Pea, Pharm D PGY1 Pharmacy Resident  03/09/2018      2:30 PM

## 2018-03-09 NOTE — Patient Instructions (Signed)
It was great to see you today.   Continue taking Harvoni daily.

## 2018-03-12 ENCOUNTER — Encounter (HOSPITAL_COMMUNITY): Payer: Self-pay | Admitting: *Deleted

## 2018-03-12 ENCOUNTER — Ambulatory Visit (INDEPENDENT_AMBULATORY_CARE_PROVIDER_SITE_OTHER)
Admission: RE | Admit: 2018-03-12 | Discharge: 2018-03-12 | Disposition: A | Discharge status: Home / Self Care | Payer: Medicare Other | Source: Home / Self Care | Attending: Psychiatry | Admitting: Psychiatry

## 2018-03-12 ENCOUNTER — Emergency Department (HOSPITAL_COMMUNITY)
Admission: EM | Admit: 2018-03-12 | Discharge: 2018-03-13 | Disposition: A | Discharge status: Other Acute Inpatient Hospital | Payer: Medicare Other | Attending: Emergency Medicine | Admitting: Emergency Medicine

## 2018-03-12 DIAGNOSIS — R45851 Suicidal ideations: Secondary | ICD-10-CM | POA: Insufficient documentation

## 2018-03-12 DIAGNOSIS — F1110 Opioid abuse, uncomplicated: Secondary | ICD-10-CM

## 2018-03-12 DIAGNOSIS — F17210 Nicotine dependence, cigarettes, uncomplicated: Secondary | ICD-10-CM | POA: Insufficient documentation

## 2018-03-12 DIAGNOSIS — F332 Major depressive disorder, recurrent severe without psychotic features: Secondary | ICD-10-CM | POA: Insufficient documentation

## 2018-03-12 DIAGNOSIS — Z79899 Other long term (current) drug therapy: Secondary | ICD-10-CM | POA: Insufficient documentation

## 2018-03-12 DIAGNOSIS — B1710 Acute hepatitis C without hepatic coma: Secondary | ICD-10-CM | POA: Insufficient documentation

## 2018-03-12 DIAGNOSIS — R110 Nausea: Secondary | ICD-10-CM | POA: Insufficient documentation

## 2018-03-12 DIAGNOSIS — F329 Major depressive disorder, single episode, unspecified: Secondary | ICD-10-CM | POA: Insufficient documentation

## 2018-03-12 DIAGNOSIS — Z880 Allergy status to penicillin: Secondary | ICD-10-CM

## 2018-03-12 DIAGNOSIS — F1490 Cocaine use, unspecified, uncomplicated: Secondary | ICD-10-CM | POA: Insufficient documentation

## 2018-03-12 DIAGNOSIS — F419 Anxiety disorder, unspecified: Secondary | ICD-10-CM

## 2018-03-12 DIAGNOSIS — G4700 Insomnia, unspecified: Secondary | ICD-10-CM | POA: Diagnosis not present

## 2018-03-12 LAB — COMPREHENSIVE METABOLIC PANEL
AG RATIO: 1.8 (calc) (ref 1.0–2.5)
ALBUMIN: 3.8 g/dL (ref 3.5–5.0)
ALK PHOS: 49 U/L (ref 38–126)
ALKALINE PHOSPHATASE (APISO): 55 U/L (ref 40–115)
ALT: 11 U/L (ref 9–46)
ALT: 14 U/L (ref 0–44)
ANION GAP: 11 (ref 5–15)
AST: 24 U/L (ref 10–40)
AST: 19 U/L (ref 15–41)
Albumin: 4.6 g/dL (ref 3.6–5.1)
BUN: 12 mg/dL (ref 7–25)
BUN: 10 mg/dL (ref 6–20)
CALCIUM: 8.9 mg/dL (ref 8.9–10.3)
CHLORIDE: 101 mmol/L (ref 98–110)
CHLORIDE: 105 mmol/L (ref 98–111)
CO2: 30 mmol/L (ref 20–32)
CO2: 22 mmol/L (ref 22–32)
Calcium: 9.7 mg/dL (ref 8.6–10.3)
Creat: 1.25 mg/dL (ref 0.60–1.35)
Creatinine, Ser: 1.11 mg/dL (ref 0.61–1.24)
GFR calc Af Amer: >60 mL/min (ref >60)
GFR calc non Af Amer: >60 mL/min (ref >60)
GLOBULIN: 2.6 g/dL (ref 1.9–3.7)
GLUCOSE: 97 mg/dL (ref 70–99)
Glucose, Bld: 86 mg/dL (ref 65–99)
POTASSIUM: 4 mmol/L (ref 3.5–5.3)
Potassium: 3.7 mmol/L (ref 3.5–5.1)
SODIUM: 138 mmol/L (ref 135–145)
Sodium: 136 mmol/L (ref 135–146)
Total Bilirubin: 0.6 mg/dL (ref 0.2–1.2)
Total Bilirubin: 0.4 mg/dL (ref 0.3–1.2)
Total Protein: 7.2 g/dL (ref 6.1–8.1)
Total Protein: 7.3 g/dL (ref 6.5–8.1)

## 2018-03-12 LAB — CBC
HEMATOCRIT: 50.7 % (ref 39.0–52.0)
HEMOGLOBIN: 16.4 g/dL (ref 13.0–17.0)
MCH: 31.2 pg (ref 26.0–34.0)
MCHC: 32.3 g/dL (ref 30.0–36.0)
MCV: 96.6 fL (ref 80.0–100.0)
NRBC: 0 % (ref 0.0–0.2)
Platelets: 156 10*3/uL (ref 150–400)
RBC: 5.25 MIL/uL (ref 4.22–5.81)
RDW: 12.8 % (ref 11.5–15.5)
WBC: 6.1 10*3/uL (ref 4.0–10.5)

## 2018-03-12 LAB — RAPID URINE DRUG SCREEN, HOSP PERFORMED
AMPHETAMINES: NOT DETECTED
BARBITURATES: NOT DETECTED
BENZODIAZEPINES: NOT DETECTED
Cocaine: POSITIVE — AB
Opiates: NOT DETECTED
TETRAHYDROCANNABINOL: NOT DETECTED

## 2018-03-12 LAB — HEPATITIS C RNA QUANTITATIVE
HCV Quantitative Log: <1.18 Log IU/mL
HCV RNA, PCR, QN: <15 IU/mL

## 2018-03-12 LAB — ACETAMINOPHEN LEVEL: Acetaminophen (Tylenol), Serum: <10 ug/mL — ABNORMAL LOW (ref 10–30)

## 2018-03-12 LAB — ETHANOL: Alcohol, Ethyl (B): <10 mg/dL (ref <10)

## 2018-03-12 LAB — SALICYLATE LEVEL: Salicylate Lvl: <7 mg/dL (ref 2.8–30.0)

## 2018-03-12 MED ORDER — FINASTERIDE 5 MG PO TABS: 2.5000 mg | ORAL_TABLET | Freq: Every day | ORAL | Status: DC

## 2018-03-12 MED ORDER — ESTRADIOL 1 MG PO TABS
2.0000 mg | ORAL_TABLET | Freq: Three times a day (TID) | ORAL | Status: DC
  Administered 2018-03-13: 2 mg via ORAL
  Filled 2018-03-12: qty 2

## 2018-03-12 MED ORDER — LEDIPASVIR-SOFOSBUVIR 90-400 MG PO TABS: 1.0000 | ORAL_TABLET | Freq: Every day | ORAL | Status: DC

## 2018-03-12 MED ORDER — MIRTAZAPINE 7.5 MG PO TABS
7.5000 mg | ORAL_TABLET | Freq: Every day | ORAL | Status: DC
  Administered 2018-03-13: 7.5 mg via ORAL
  Filled 2018-03-12: qty 1

## 2018-03-12 MED ORDER — PRAZOSIN HCL 1 MG PO CAPS
1.0000 mg | ORAL_CAPSULE | Freq: Every day | ORAL | Status: DC
  Administered 2018-03-13: 1 mg via ORAL
  Filled 2018-03-12: qty 1

## 2018-03-12 MED ORDER — SPIRONOLACTONE 25 MG PO TABS: 50.0000 mg | ORAL_TABLET | Freq: Every day | ORAL | Status: DC

## 2018-03-12 MED ORDER — ESCITALOPRAM OXALATE 10 MG PO TABS: 30.0000 mg | ORAL_TABLET | Freq: Every day | ORAL | Status: DC
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
## 2018-03-12 NOTE — ED Triage Notes (Signed)
Pt complains of anxiety and depression for the past month. Pt states he has SI, pt has thoughts of overdosing on opiates. Pt states he stopped taking his regular psychiatric medications last week and has been using heroin and cocaine. Pt last used drugs 3-4 days ago.

## 2018-03-12 NOTE — H&P (Addendum)
Behavioral Health Medical Screening Exam  Randy [name] is an 47 y.o. adult.  Total Time spent with patient: 15 minutes  Psychiatric Specialty Exam: Physical Exam  Vitals reviewed. Constitutional: She is oriented to person, place, and time. She appears well-developed. She appears distressed.  HENT:  Head: Normocephalic and atraumatic.  Right Ear: External ear normal.  Left Ear: External ear normal.  Nose: Nose normal.  Eyes: Pupils are equal, round, and reactive to light. Conjunctivae and EOM are normal. Right eye exhibits no discharge. Left eye exhibits no discharge. Scleral icterus is present.  Neck: Normal range of motion. No JVD present. No tracheal deviation present.  Cardiovascular: Normal rate and regular rhythm.  Respiratory: Effort normal and breath sounds normal. No stridor. No respiratory distress. She has no wheezes.  GI: She exhibits no distension.  Inspection normal  Genitourinary:  Genitourinary Comments: Deferred  Musculoskeletal: Normal range of motion.  Neurological: She is alert and oriented to person, place, and time. No cranial nerve deficit. Coordination normal.  Skin: Skin is warm and dry. No rash noted. No erythema. There is pallor.  Psychiatric:  See MSE    Review of Systems  Constitutional: Negative for chills, diaphoresis, fever and weight loss.  HENT: Negative for congestion, ear pain, hearing loss, nosebleeds and sore throat.   Eyes: Negative for blurred vision, double vision, photophobia, pain, discharge and redness.  Respiratory: Negative for cough, hemoptysis, sputum production, shortness of breath, wheezing and stridor.   Cardiovascular: Negative for chest pain, palpitations, orthopnea, claudication, leg swelling and PND.  Gastrointestinal: Negative for abdominal pain, blood in stool, constipation, diarrhea, heartburn, melena, nausea and vomiting.       Hep C 2nd month  Harvoni  Genitourinary: Negative for dysuria, flank pain, frequency, hematuria  and urgency.  Musculoskeletal: Negative for back pain, falls, joint pain, myalgias and neck pain.  Skin: Negative for itching and rash.  Neurological: Negative for dizziness, tingling, tremors, sensory change, speech change, focal weakness, seizures, loss of consciousness, weakness and headaches.  Endo/Heme/Allergies: Negative for environmental allergies and polydipsia. Does not bruise/bleed easily.  Psychiatric/Behavioral: Positive for depression, memory loss (Substance use realted), substance abuse and suicidal ideas. Negative for hallucinations. The patient is nervous/anxious and has insomnia.     BP 126/71 P-81 T-98.2 R-17  General Appearance: Casual and Disheveled  Eye Contact:  Good  Speech:  Clear and Coherent  Volume:  Decreased  Mood:  Dysphoric  Affect:  Congruent  Thought Process:  Coherent, Goal Directed and Descriptions of Associations: Intact  Orientation:  Full (Time, Place, and Person)  Thought Content:  Rumination  Suicidal Thoughts:  Yes.  with intent/plan  Homicidal Thoughts:  No  Memory:  Unclear  Judgement:  Impaired  Insight:  Lacking  Psychomotor Activity:  Decreased  Concentration: Concentration: intact for visit and Attention Span: INTACT FOR VISIT  Recall:  Fair  Fund of Knowledge:Limited  Language: WDL  Akathisia:  Negative  Handed:  Right  AIMS (if indicated):     Assets:  Desire for Improvement Financial Resources/Insurance Housing Resilience Social Support  Sleep: Disrupted      Musculoskeletal: Strength & Muscle Tone: within normal limits Gait & Station: normal Patient leans: N/A   Recommendations:  Based on my evaluation the patient appears to have an emergency medical condition for which I recommend the patient be transferred to the emergency department for further evaluation.  Maryjean Morn, PA-C 03/12/2018, 6:25 PM

## 2018-03-12 NOTE — ED Provider Notes (Signed)
[facility]-EMERGENCY DEPT Provider Note   CSN: [id number] Arrival date & time: 03/12/18  1833     History   Chief Complaint Chief Complaint  Patient presents with  . Suicidal  . Medical Clearance    HPI Randy [name] is a 47 y.o. 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  She endorses a plan to overdose on opiates.  She has stopped taking her regular psychiatric medications approximately 1.5 weeks ago because "it is just too much effort to go to the pharmacy to get them refilled ".  Most recently used cocaine 3 days ago and heroin a few days prior to that.  Denies homicidal ideation or auditory visual hallucinations.  Currently endorses some generalized body aches and nausea which she attributes to "withdrawals ".  Denies any other complaints at this time.  Was seen and evaluated at the behavioral health hospital prior to arrival and was sent for medical clearance labs.  Meets inpatient criteria per documentation there.  The history is provided by the patient.    Past Medical History:  Diagnosis Date  . Cellulitis  Of  Left Forearm 01/10/2012   From IVDA  . Depression   . Hepatitis C   . 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 Severe recurrent major depression without psychotic features (HCC) 11/16/2017  . Liver fibrosis 06/22/2017  . MDD (major depressive disorder), single episode, severe , no psychosis (HCC) 06/11/2017  . Cough 03/14/2015  . Major depressive disorder, recurrent, severe without psychotic features (HCC)   . Opioid dependence with opioid-induced mood disorder (HCC)   . Cocaine dependence with cocaine-induced mood disorder (HCC)   . MDD (major depressive disorder) 09/17/2014    . MDD (major depressive disorder), recurrent severe, without psychosis (HCC) 07/05/2014  . Major depression, recurrent (HCC) 04/10/2012  . Cocaine dependence (HCC) 04/10/2012  . Polysubstance abuse including IVDA (heroin), cocaine, marijuana 01/10/2012  . Elevated LFTs 01/10/2012  . Chronic hepatitis C without hepatic coma (HCC) 01/10/2012  . 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 Tobacco abuse 01/10/2012  . Anxiety disorder 08/14/2011  . Recurrent major depression-severe (HCC) 08/13/2011    Past Surgical History:  Procedure Laterality Date  . I&D EXTREMITY  01/13/2012   Procedure: IRRIGATION AND DEBRIDEMENT EXTREMITY;  Surgeon: Kennieth Rad, MD;  Location: [facility] ORS;  Service: Orthopedics;  Laterality: Left;        Home Medications    Prior to Admission medications   Medication Sig Start Date End Date Taking? Authorizing Provider  ARIPiprazole (ABILIFY) 2 MG tablet Take 1 tablet (2 mg total) by mouth daily. 11/24/17  Yes Oneta Rack, NP  escitalopram (LEXAPRO) 10 MG tablet Take 3 tablets (30 mg total) by mouth daily. 11/24/17  Yes Oneta Rack, NP  estradiol (ESTRACE) 2 MG tablet Take 1 tablet (2 mg total) by mouth 3 (three) times daily. For hormonal supplementation 02/16/18  Yes Kuppelweiser, Cassie L, RPH-CPP  finasteride (PROSCAR) 5 MG tablet Take 0.5 tablets (2.5 mg total) by mouth daily. For hormonal treatment for male transgender process: Take 1/2 tablet 02/16/18  Yes Kuppelweiser, Cassie L, RPH-CPP  Ledipasvir-Sofosbuvir (HARVONI) 90-400 MG TABS Take 1 tablet by mouth daily. 12/28/17  Yes Comer, Belia Heman, MD  mirtazapine (REMERON) 7.5 MG  tablet Take 1 tablet (7.5 mg total) by mouth at bedtime. 11/23/17  Yes Oneta Rack, NP  prazosin (MINIPRESS) 1 MG capsule Take 1 capsule (1 mg total) by mouth at bedtime. 11/23/17  Yes Oneta Rack, NP  spironolactone (ALDACTONE) 50 MG tablet Take 1 tablet (50 mg total) by mouth daily. Part treatment for transgender  process. 02/16/18  Yes Kuppelweiser, Cassie L, RPH-CPP  hydrOXYzine (ATARAX/VISTARIL) 25 MG tablet Take 1 tablet (25 mg total) by mouth 3 (three) times daily as needed for anxiety. Patient not taking: Reported on 03/12/2018 11/23/17   Oneta Rack, NP  nicotine (NICODERM CQ - DOSED IN MG/24 HOURS) 21 mg/24hr patch Place 1 patch (21 mg total) onto the skin daily. (May purchase from over the counter): For smoking cessation Patient not taking: Reported on 06/21/2017 06/17/17   Sanjuana Kava, NP    Family History Family History  Problem Relation Age of Onset  . Idiopathic pulmonary fibrosis Father     Social History Social History   Tobacco Use  . Smoking status: Current Every Day Smoker    Packs/day: 0.50    Years: 20.00    Pack years: 10.00    Types: Cigarettes  . Smokeless tobacco: Never Used  . Tobacco comment: trying to quit-using e-cig some  Substance Use Topics  . Alcohol use: Yes    Comment: occ  . Drug use: Yes    Types: Heroin, "Crack" cocaine, Marijuana    Comment: heroin last used 07/14/14, crack/ had some a few days ago     Allergies   Penicillins   Review of Systems Review of Systems  Constitutional: Negative for fever.  Respiratory: Negative for shortness of breath.   Cardiovascular: Negative for chest pain.  Gastrointestinal: Positive for nausea. Negative for vomiting.  Musculoskeletal: Positive for myalgias.  Psychiatric/Behavioral: Positive for suicidal ideas. The patient is nervous/anxious.   All other systems reviewed and are negative.    Physical Exam Updated Vital Signs BP 130/75 (BP Location: Left Arm)   Pulse 77   Temp 98 F (36.7 C) (Oral)   Resp 18   SpO2 99%   Physical Exam  Constitutional: She appears well-developed and well-nourished. No distress.  HENT:  Head: Normocephalic and atraumatic.  Eyes: Conjunctivae are normal. Right eye exhibits no discharge. Left eye exhibits no discharge.  Neck: No JVD present. No tracheal deviation  present.  Cardiovascular: Normal rate, regular rhythm, normal heart sounds and intact distal pulses.  Pulmonary/Chest: Effort normal and breath sounds normal.  Abdominal: Soft. Bowel sounds are normal. She exhibits no distension. There is no tenderness. There is no guarding.  Musculoskeletal: She exhibits no edema.  Neurological: She is alert.  Skin: Skin is warm and dry. No erythema.  Psychiatric: She is withdrawn. She is not actively hallucinating. She exhibits a depressed mood. She expresses suicidal ideation. She expresses no homicidal ideation. She expresses suicidal plans. She expresses no homicidal plans.  Nursing note and vitals reviewed.    ED Treatments / Results  Labs (all labs ordered are listed, but only abnormal results are displayed) Labs Reviewed  ACETAMINOPHEN LEVEL - Abnormal; Notable for the following components:      Result Value   Acetaminophen (Tylenol), Serum <10 (*)    All other components within normal limits  RAPID URINE DRUG SCREEN, HOSP PERFORMED - Abnormal; Notable for the following components:   Cocaine POSITIVE (*)    All other components within normal limits  COMPREHENSIVE METABOLIC PANEL  ETHANOL  SALICYLATE LEVEL  CBC    EKG None  Radiology No results found.  Procedures Procedures (including critical care time)  Medications Ordered in ED Medications  estradiol (ESTRACE) tablet 2 mg (has no administration in time range)  escitalopram (LEXAPRO) tablet 30 mg (has no administration in time range)  finasteride (PROSCAR) tablet 2.5 mg (has no administration in time range)  Ledipasvir-Sofosbuvir 90-400 MG TABS 1 tablet (has no administration in time range)  mirtazapine (REMERON) tablet 7.5 mg (has no administration in time range)  prazosin (MINIPRESS) capsule 1 mg (has no administration in time range)  spironolactone (ALDACTONE) tablet 50 mg (has no administration in time range)     Initial Impression / Assessment and Plan / ED Course  I  have reviewed the triage vital signs and the nursing notes.  Pertinent labs & imaging results that were available during my care of the patient were reviewed by me and considered in my medical decision making (see chart for details).     Patient presents for evaluation of suicidal ideation.  She is afebrile, vital signs are stable.  She is nontoxic in appearance.  Physical examination is reassuring as are screening labs which were reviewed by me.  Was seen by behavioral health earlier today and meets inpatient criteria for admission.  She is here voluntarily and may require IVC if she attempts to leave prior to transfer to behavioral health facility.  Final Clinical Impressions(s) / ED Diagnoses   Final diagnoses:  Suicidal ideation    ED Discharge Orders    None       Bennye Alm 03/12/18 2212    Terrilee Files, MD 03/13/18 1300

## 2018-03-13 ENCOUNTER — Inpatient Hospital Stay (HOSPITAL_COMMUNITY)
Admission: AD | Admit: 2018-03-13 | Discharge: 2018-03-23 | DRG: 885 | Disposition: A | Discharge status: Home / Self Care | Payer: Medicare Other | Attending: Psychiatry | Admitting: Psychiatry

## 2018-03-13 ENCOUNTER — Other Ambulatory Visit: Payer: Self-pay

## 2018-03-13 ENCOUNTER — Encounter (HOSPITAL_COMMUNITY): Payer: Self-pay | Admitting: *Deleted

## 2018-03-13 DIAGNOSIS — Z590 Homelessness: Secondary | ICD-10-CM | POA: Diagnosis not present

## 2018-03-13 DIAGNOSIS — Z79899 Other long term (current) drug therapy: Secondary | ICD-10-CM

## 2018-03-13 DIAGNOSIS — F332 Major depressive disorder, recurrent severe without psychotic features: Secondary | ICD-10-CM | POA: Diagnosis not present

## 2018-03-13 DIAGNOSIS — Z23 Encounter for immunization: Secondary | ICD-10-CM

## 2018-03-13 DIAGNOSIS — B1920 Unspecified viral hepatitis C without hepatic coma: Secondary | ICD-10-CM | POA: Diagnosis not present

## 2018-03-13 DIAGNOSIS — K740 Hepatic fibrosis: Secondary | ICD-10-CM | POA: Diagnosis present

## 2018-03-13 DIAGNOSIS — Z79890 Hormone replacement therapy (postmenopausal): Secondary | ICD-10-CM | POA: Diagnosis not present

## 2018-03-13 DIAGNOSIS — Z9114 Patient's other noncompliance with medication regimen: Secondary | ICD-10-CM

## 2018-03-13 DIAGNOSIS — Z880 Allergy status to penicillin: Secondary | ICD-10-CM

## 2018-03-13 DIAGNOSIS — F1410 Cocaine abuse, uncomplicated: Secondary | ICD-10-CM | POA: Diagnosis present

## 2018-03-13 DIAGNOSIS — Z915 Personal history of self-harm: Secondary | ICD-10-CM | POA: Diagnosis not present

## 2018-03-13 DIAGNOSIS — R45851 Suicidal ideations: Secondary | ICD-10-CM | POA: Diagnosis present

## 2018-03-13 DIAGNOSIS — F419 Anxiety disorder, unspecified: Secondary | ICD-10-CM | POA: Diagnosis not present

## 2018-03-13 DIAGNOSIS — F640 Transsexualism: Secondary | ICD-10-CM | POA: Diagnosis present

## 2018-03-13 DIAGNOSIS — G4700 Insomnia, unspecified: Secondary | ICD-10-CM | POA: Diagnosis present

## 2018-03-13 DIAGNOSIS — F17210 Nicotine dependence, cigarettes, uncomplicated: Secondary | ICD-10-CM | POA: Diagnosis present

## 2018-03-13 DIAGNOSIS — B182 Chronic viral hepatitis C: Secondary | ICD-10-CM | POA: Diagnosis present

## 2018-03-13 DIAGNOSIS — F515 Nightmare disorder: Secondary | ICD-10-CM | POA: Diagnosis present

## 2018-03-13 DIAGNOSIS — F1110 Opioid abuse, uncomplicated: Secondary | ICD-10-CM | POA: Diagnosis present

## 2018-03-13 MED ORDER — ACETAMINOPHEN 325 MG PO TABS: 650.0000 mg | ORAL_TABLET | Freq: Four times a day (QID) | ORAL | Status: DC | PRN

## 2018-03-13 MED ORDER — SPIRONOLACTONE 100 MG PO TABS
50.0000 mg | ORAL_TABLET | Freq: Every day | ORAL | Status: DC
  Administered 2018-03-13 – 2018-03-22 (×10): 50 mg via ORAL
  Filled 2018-03-13: qty 7
  Filled 2018-03-13 (×2): qty 2
  Filled 2018-03-13 (×2): qty 1
  Filled 2018-03-13 (×5): qty 2
  Filled 2018-03-13: qty 7
  Filled 2018-03-13 (×3): qty 2

## 2018-03-13 MED ORDER — CLONIDINE HCL 0.1 MG PO TABS: 0.1000 mg | ORAL_TABLET | ORAL | Status: DC

## 2018-03-13 MED ORDER — NAPROXEN 500 MG PO TABS: 500.0000 mg | ORAL_TABLET | Freq: Two times a day (BID) | ORAL | Status: AC | PRN

## 2018-03-13 MED ORDER — FINASTERIDE 5 MG PO TABS
2.5000 mg | ORAL_TABLET | Freq: Every day | ORAL | Status: DC
  Administered 2018-03-13 – 2018-03-22 (×10): 2.5 mg via ORAL
  Filled 2018-03-13 (×5): qty 0.5
  Filled 2018-03-13: qty 7
  Filled 2018-03-13 (×4): qty 0.5
  Filled 2018-03-13: qty 7
  Filled 2018-03-13 (×2): qty 0.5

## 2018-03-13 MED ORDER — INFLUENZA VAC SPLIT QUAD 0.5 ML IM SUSY
0.5000 mL | PREFILLED_SYRINGE | INTRAMUSCULAR | Status: AC
  Administered 2018-03-15: 0.5 mL via INTRAMUSCULAR
  Filled 2018-03-13: qty 0.5

## 2018-03-13 MED ORDER — MIRTAZAPINE 7.5 MG PO TABS
7.5000 mg | ORAL_TABLET | Freq: Every day | ORAL | Status: DC
  Administered 2018-03-13 – 2018-03-22 (×10): 7.5 mg via ORAL
  Filled 2018-03-13 (×3): qty 1
  Filled 2018-03-13 (×2): qty 14
  Filled 2018-03-13 (×7): qty 1

## 2018-03-13 MED ORDER — CLONIDINE HCL 0.1 MG PO TABS: 0.1000 mg | ORAL_TABLET | Freq: Every day | ORAL | Status: DC

## 2018-03-13 MED ORDER — HYDROXYZINE HCL 25 MG PO TABS: 25.0000 mg | ORAL_TABLET | Freq: Four times a day (QID) | ORAL | Status: AC | PRN

## 2018-03-13 MED ORDER — ONDANSETRON 4 MG PO TBDP: 4.0000 mg | ORAL_TABLET | Freq: Four times a day (QID) | ORAL | Status: AC | PRN

## 2018-03-13 MED ORDER — LEDIPASVIR-SOFOSBUVIR 90-400 MG PO TABS
1.0000 | ORAL_TABLET | Freq: Every day | ORAL | Status: DC
  Administered 2018-03-13 – 2018-03-22 (×10): 1 via ORAL

## 2018-03-13 MED ORDER — CLONIDINE HCL 0.1 MG PO TABS
0.1000 mg | ORAL_TABLET | Freq: Four times a day (QID) | ORAL | Status: DC
  Filled 2018-03-13 (×5): qty 1

## 2018-03-13 MED ORDER — MAGNESIUM HYDROXIDE 400 MG/5ML PO SUSP: 30.0000 mL | Freq: Every day | ORAL | Status: DC | PRN

## 2018-03-13 MED ORDER — METHOCARBAMOL 500 MG PO TABS: 500.0000 mg | ORAL_TABLET | Freq: Three times a day (TID) | ORAL | Status: DC | PRN

## 2018-03-13 MED ORDER — ESTRADIOL 1 MG PO TABS
2.0000 mg | ORAL_TABLET | Freq: Three times a day (TID) | ORAL | Status: DC
  Administered 2018-03-13 – 2018-03-22 (×30): 2 mg via ORAL
  Filled 2018-03-13 (×2): qty 1
  Filled 2018-03-13: qty 84
  Filled 2018-03-13: qty 1
  Filled 2018-03-13: qty 84
  Filled 2018-03-13 (×3): qty 1
  Filled 2018-03-13: qty 84
  Filled 2018-03-13 (×2): qty 1
  Filled 2018-03-13: qty 2
  Filled 2018-03-13 (×8): qty 1
  Filled 2018-03-13: qty 84
  Filled 2018-03-13 (×2): qty 1
  Filled 2018-03-13: qty 84
  Filled 2018-03-13 (×5): qty 1
  Filled 2018-03-13: qty 84
  Filled 2018-03-13 (×8): qty 1

## 2018-03-13 MED ORDER — ALUM & MAG HYDROXIDE-SIMETH 200-200-20 MG/5ML PO SUSP: 30.0000 mL | ORAL | Status: DC | PRN

## 2018-03-13 MED ORDER — DICYCLOMINE HCL 20 MG PO TABS: 20.0000 mg | ORAL_TABLET | Freq: Four times a day (QID) | ORAL | Status: DC | PRN

## 2018-03-13 MED ORDER — PRAZOSIN HCL 1 MG PO CAPS
1.0000 mg | ORAL_CAPSULE | Freq: Every day | ORAL | Status: DC
  Administered 2018-03-13 – 2018-03-22 (×10): 1 mg via ORAL
  Filled 2018-03-13 (×7): qty 1
  Filled 2018-03-13: qty 14
  Filled 2018-03-13 (×3): qty 1
  Filled 2018-03-13: qty 14
  Filled 2018-03-13: qty 1

## 2018-03-13 MED ORDER — ARIPIPRAZOLE 2 MG PO TABS
2.0000 mg | ORAL_TABLET | Freq: Every day | ORAL | Status: DC
  Administered 2018-03-13 – 2018-03-14 (×2): 2 mg via ORAL
  Filled 2018-03-13 (×3): qty 1

## 2018-03-13 MED ORDER — LOPERAMIDE HCL 2 MG PO CAPS: 2.0000 mg | ORAL_CAPSULE | ORAL | Status: DC | PRN

## 2018-03-13 MED ORDER — ESCITALOPRAM OXALATE 10 MG PO TABS
10.0000 mg | ORAL_TABLET | Freq: Every day | ORAL | Status: DC
  Administered 2018-03-14: 10 mg via ORAL
  Filled 2018-03-13 (×2): qty 1

## 2018-03-13 MED ORDER — ESCITALOPRAM OXALATE 20 MG PO TABS
30.0000 mg | ORAL_TABLET | Freq: Every day | ORAL | Status: DC
  Administered 2018-03-13: 30 mg via ORAL
  Filled 2018-03-13 (×2): qty 1

## 2018-03-13 NOTE — Tx Team (Signed)
Initial Treatment Plan 03/13/2018 3:43 AM [name] ZOX:[id number]    PATIENT STRESSORS: Financial difficulties Medication change or noncompliance Substance abuse   PATIENT STRENGTHS: Active sense of humor Average or above average intelligence Capable of independent living Special hobby/interest   PATIENT IDENTIFIED PROBLEMS: Depression  Suicidal Ideation  Substance abuse    "work on my mental health"  "sobriety"           DISCHARGE CRITERIA:  Adequate post-discharge living arrangements Improved stabilization in mood, thinking, and/or behavior Need for constant or close observation no longer present Verbal commitment to aftercare and medication compliance Withdrawal symptoms are absent or subacute and managed without 24-hour nursing intervention  PRELIMINARY DISCHARGE PLAN: Attend 12-step recovery group Outpatient therapy Placement in alternative living arrangements  PATIENT/FAMILY INVOLVEMENT: This treatment plan has been presented to and reviewed with the patient, Randy [name].  The patient and family have been given the opportunity to ask questions and make suggestions.  Juliann Pares, RN 03/13/2018, 3:43 AM

## 2018-03-13 NOTE — B[facility] Group Notes (Signed)
LCSW Group Therapy Note  03/13/2018    10:00 - 11:00 AM               Type of Therapy and Topic:  Group Therapy: Well-being  Participation Level:  Active   Description of Group:   In this group, patients started with an ice breaker talking about their favorite type of pie and the ingredients needed to make it. CSW asked patients what happens if we are missing one thing we need like the pan or the flour. CSW related this to our well-being and created a well-being pie. Patients learned how to define well-being as well as the parts that include: physical, mental, emotional and spiritual. They were asked to identify why each part is important and provide examples for each section. Patients engaged in group discussion about these concepts. Patients were provided a self-assessment to explore their own well-being and areas that they may need to make a change in. Patients shared results as comfortable doing so. Patients were provided a handout with a list of ways to improve our well-being and to cope better with life. Patients were asked to read these out loud and share thoughts. CSW explained our circle could also be a flat tire because like missing an ingredient the flat tire makes it difficult to steer, the ride bumpy, could cause a wreck or keep [place] from reaching our destination. CSW placed an emphasis on therapy, doctor visits and taking medications.   Therapeutic Goals: 1. Patients will learn the four main areas of our well-being and examples of each. 2. Patients will complete an assessment to reflect on their own needs. 3. Patients will discuss how they can meet the needs. 4. Patients will be asked to read out loud some positive ways to improve our overall well-being.  5. Patients will be encouraged to identify one area that needs improvement and create a plan on how to achieve this.    Summary of Patient Progress:  Patient was engaged and participated when called on directly. Patient stated she  was not to sure about anything and she is tired.    Therapeutic Modalities:   Cognitive Behavioral Therapy Motivational Interviewing  Brief Therapy  Shellia Cleverly, LCSW  03/13/2018 3:05 PM

## 2018-03-13 NOTE — Plan of Care (Signed)
D: Patient presents depressed and in withdrawal. She is a transgender male transitioning to male. She complains of feeling down and depressed this morning. She is having suicidal thoughts with no plan or intent. She describes them as passive thoughts of not wanting to live. She slept well last night. She also does not feel like she is having opioid withdrawal symptoms other than mild diaphoresis. Patient denies HI/AVH.  A: Patient checked q15 min, and checks reviewed. Reviewed medication changes with patient and educated on side effects. Educated patient on importance of attending group therapy sessions and educated on several coping skills. Encouarged participation in milieu through recreation therapy and attending meals with peers. Support and encouragement provided. Fluids offered. R: Patient receptive to education on medications, and is medication compliant. Patient contracts for safety on the unit. Patient did not fill out a self-inventory.

## 2018-03-13 NOTE — [facility] Suicide Risk Assessment (Signed)
[facility] INPATIENT:  Family/Significant Other Suicide Prevention Education  Suicide Prevention Education:  Patient Refusal for Family/Significant Other Suicide Prevention Education: The patient Randy [name] has refused to provide written consent for family/significant other to be provided Family/Significant Other Suicide Prevention Education during admission and/or prior to discharge.  Physician notified.  Randy [name] 03/13/2018, 3:13 PM
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
## 2018-03-13 NOTE — [facility] Group Notes (Signed)
[facility] Group Notes:  (Nursing/MHT/Case Management/Adjunct)  Date:  03/13/2018  Time:  3:46 PM  Type of Therapy:  Nurse Education  Participation Level:  Did Not Attend  Summary of Progress/Problems: In this group, the nurse discussed positive self-image, positive self-talk. The nurse also covered how to change automatic negative thoughts and improving self-narrative.  Kirstie Mirza 03/13/2018, 3:46 PM

## 2018-03-13 NOTE — Progress Notes (Signed)
D.  Pt in bed on approach, dd not get up for evening AA group.  Pt did not voice any complaints at this time.  Pt has had minimal interaction on unit and usually stays in her room.  Pt denies SI/HI/AVH at this time.  A.  Support and encouragement offered, medication given as ordered  R.  Pt remain safe on the unit, will continue to monitor.

## 2018-03-13 NOTE — Progress Notes (Signed)
Patient ID: Randy [name], adult   DOB: 1970-06-23, 47 y.o.   MRN: [id number] PER STATE REGULATIONS 482.30  THIS CHART WAS REVIEWED FOR MEDICAL NECESSITY WITH RESPECT TO THE PATIENT'S ADMISSION/DURATION OF STAY.  NEXT REVIEW DATE:03/17/18  Loura Halt, RN, BSN CASE MANAGER

## 2018-03-13 NOTE — Progress Notes (Signed)
Pt is a 47 year old adult presenting voluntarily for treatment of depression with suicidal ideation and substance abuse.  Pt states that she is presently homeless and does not have a regular doctor.  Pt is on disability and has been using heroin, cocaine, TCH, and alcohol.  She states that none of these are daily but that she binges.  Pt does not appear to have any signs or symptoms of withdrawal at present.  Pt denies having any support and states that her goal is mainly "to work on my mental health".  Pt is cooperative with the admission process.

## 2018-03-13 NOTE — Progress Notes (Signed)
Patient did not attend the evening speaker AA meeting. Pt was notified that group was beginning but remained in bed.   
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
## 2018-03-14 ENCOUNTER — Ambulatory Visit: Payer: Self-pay | Admitting: Family Medicine

## 2018-03-14 DIAGNOSIS — F640 Transsexualism: Secondary | ICD-10-CM

## 2018-03-14 LAB — TSH: TSH: 1.04 u[IU]/mL (ref 0.350–4.500)

## 2018-03-14 MED ORDER — NICOTINE POLACRILEX 2 MG MT GUM
2.0000 mg | CHEWING_GUM | OROMUCOSAL | Status: DC | PRN
  Administered 2018-03-14 – 2018-03-22 (×24): 2 mg via ORAL
  Filled 2018-03-14 (×9): qty 1

## 2018-03-14 MED ORDER — ARIPIPRAZOLE 5 MG PO TABS
5.0000 mg | ORAL_TABLET | Freq: Every day | ORAL | Status: DC
  Administered 2018-03-15 – 2018-03-22 (×8): 5 mg via ORAL
  Filled 2018-03-14 (×3): qty 1
  Filled 2018-03-14: qty 14
  Filled 2018-03-14 (×7): qty 1
  Filled 2018-03-14: qty 14

## 2018-03-14 MED ORDER — ESCITALOPRAM OXALATE 20 MG PO TABS
20.0000 mg | ORAL_TABLET | Freq: Every day | ORAL | Status: DC
  Administered 2018-03-15 – 2018-03-22 (×8): 20 mg via ORAL
  Filled 2018-03-14: qty 14
  Filled 2018-03-14 (×8): qty 1
  Filled 2018-03-14: qty 14
  Filled 2018-03-14: qty 1

## 2018-03-14 NOTE — Progress Notes (Signed)
Recreation Therapy Notes  Date: 11.4.19 Time: 0930 Location: [address] Dayroom  Group Topic: Stress Management  Goal Area(s) Addresses:  Patient will verbalize importance of using healthy stress management.  Patient will identify positive emotions associated with healthy stress management.   Intervention: Stress Management  Activity :  Meditation.  LRT introduced the stress management technique of meditation.  LRT played a meditation dealing with resilience.  Patients were to listen and follow along with the meditation.  Education:  Stress Management, Discharge Planning.   Education Outcome: Acknowledges edcuation/In group clarification offered/Needs additional education  Clinical Observations/Feedback: Pt did not attend group.     Caroll Rancher, LRT/CTRS         Lillia Abed, Chanita Boden A 03/14/2018 10:57 AM

## 2018-03-14 NOTE — [facility] Group Notes (Signed)
Adult Psychoeducational Group Note  Date:  03/14/2018  Time: 4:00 PM  Group Topic/Focus: Mindfulness The purpose of this group is to educate about the physiologic effects of anxiety on the body and the benefits of mindfulness to decrease anxiety. Patients were provided techniques to practice grounding and mindfulness.  Participation Level:  Did Not Attend  Additional Comments:  Patient was invited but declined to attend group.  Sharvil Hoey A Lancelot Alyea 03/14/2018 5:00 PM 

## 2018-03-14 NOTE — Progress Notes (Signed)
D: Pt was in bed in her room upon initial approach.  Pt presents with depressed affect and mood.  Pt describes her day as "fine" and denies having a goal.  Pt and writer made goal for pt to "be safe and sleep well."  Pt denies SI/HI, denies hallucinations, denies pain.  Pt has been in her room for the majority of the night and reports "I'm just tired."  Pt did not attend evening group.    A: Introduced self to pt.  Met with pt 1:1.  Actively listened to pt and offered support and encouragement. Medications administered per order.  Q15 minute safety checks maintained.  R: Pt is safe on the unit.  Pt is compliant with medications.  Pt verbally contracts for safety.  Will continue to monitor and assess.

## 2018-03-14 NOTE — Plan of Care (Addendum)
Patient was sad and depressed upon approach this morning. Patient complained of being tired, denies SI HI AVH. Denies physical pain or problems. Patient is compliant with medications prescribed per provider. Safety is maintained with 15 minute checks. Will continue to monitor.  Problem: Education: Goal: Verbalization of understanding the information provided will improve Outcome: Progressing   Problem: Coping: Goal: Ability to demonstrate self-control will improve Outcome: Progressing   Problem: Education: Goal: Emotional status will improve Outcome: Not Progressing Goal: Mental status will improve Outcome: Not Progressing   Problem: Activity: Goal: Interest or engagement in activities will improve Outcome: Not Progressing Goal: Sleeping patterns will improve Outcome: Not Progressing

## 2018-03-14 NOTE — Progress Notes (Signed)
[facility] MD Progress Note  03/14/2018 12:46 PM Desmond Szabo  MRN:  [id number] Subjective:    History as per psychiatric intake suicide risk assessment: 47 year old transgender male,known to our service from prior admissions, most recently in July 2019 for depression, suicidal ideations. At the time was diagnosed with MDD and Cocaine Use Disorder. Was discharged on Abilify, Lexapro, Remeron, Minipress. Patient presented for worsening depression, suicidal ideations, with thoughts of overdosing. Describes anhedonia, decreased appetite, decreased energy level, poor sleep. Denies hallucinations. States that a few weeks ago she ran into a friend of her deceased BF [name] ( who passed away in June 15, 2017) and states " everything seemed to go downhill from there after that". She reports recently relapsing on cocaine and heroin . States she had used " constantly for about 10 days , but none before then". Of note, states she has not used heroin over the last 5 days. Admission UDS positive for cocaine , but not for opiates. Admission BAL negative. Denies any alcohol or BZD abuse . States she had stopped her psychiatric medications about a month ago.    As per evaluation today: Pt was evaluated in the office with SW team present. Pt shares about narrative of events prior to admission, "It was mostly depression and anxiety, and I had come off my medications a month ago. I ran into an old friend of my boyfriend, who died in 06/15/22, and everything has just been kind of falling apart since then." Pt describes worsening depression, anxiety, and substance use of heroin and cocaine. She is still having depression and anxiety today, but she denies SI/HI/AH/VH. She would like to be referred to substance use treatment through [facility] or [facility]. She denies physical complaints today. She would like to increase dose of lexapro to previous dose of 20mg  qDay and we also discussed increasing dose of abilify. Pt was in agreement with  that plan, and she had no further questions, comments, or concerns.   Principal Problem: Severe recurrent major depression without psychotic features (HCC) Diagnosis:   Patient Active Problem List   Diagnosis Date Noted  . Severe recurrent major depression without psychotic features (HCC) [F33.2] 11/16/2017  . Liver fibrosis [K74.0] 06/22/2017  . MDD (major depressive disorder), single episode, severe , no psychosis (HCC) [F32.2] 06/11/2017  . Cough [R05] 03/14/2015  . Major depressive disorder, recurrent, severe without psychotic features (HCC) [F33.2]   . Opioid dependence with opioid-induced mood disorder (HCC) [F11.24]   . Cocaine dependence with cocaine-induced mood disorder (HCC) [F14.24]   . MDD (major depressive disorder) [F32.9] 09/17/2014  . MDD (major depressive disorder), recurrent severe, without psychosis (HCC) [F33.2] 07/05/2014  . Major depression, recurrent (HCC) [F33.9] 04/10/2012  . Cocaine dependence (HCC) [F14.20] 04/10/2012  . Polysubstance abuse including IVDA (heroin), cocaine, marijuana [F19.10] 01/10/2012  . Elevated LFTs [R94.5] 01/10/2012  . Chronic hepatitis C without hepatic coma (HCC) [B18.2] 01/10/2012  . 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 Tobacco abuse [Z72.0] 01/10/2012  . Anxiety disorder [F41.9] 08/14/2011  . Recurrent major depression-severe (HCC) [F33.2] 08/13/2011   Total Time spent with patient: 30 minutes  Past Psychiatric History: see H&P  Past Medical History:  Past Medical History:  Diagnosis Date  . Cellulitis  Of  Left Forearm 01/10/2012   From IVDA  . Depression   . Hepatitis C   . 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 I&D EXTREMITY  01/13/2012  Procedure: IRRIGATION AND DEBRIDEMENT EXTREMITY;  Surgeon: Kennieth Rad, MD;  Location: [facility]S;  Service: Orthopedics;  Laterality: Left;   Family History:  Family History  Problem  Relation Age of Onset  . Idiopathic pulmonary fibrosis Father    Family Psychiatric  History: see H&P Social History:  Social History   Substance and Sexual Activity  Alcohol Use Yes   Comment: occ     Social History   Substance and Sexual Activity  Drug Use Yes  . Types: Heroin, "Crack" cocaine, Marijuana   Comment: heroin last used 07/14/14, crack/ had some a few days ago    Social History   Socioeconomic History  . Marital status: Single    Spouse name: Not on file  . Number of children: Not on file  . Years of education: Not on file  . Highest education level: Not on file  Occupational History  . 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 Financial resource strain: Very hard  . Food insecurity:    Worry: Often true    Inability: Often true  . Transportation needs:    Medical: Yes    Non-medical: Yes  Tobacco Use  . Smoking status: Current Every Day Smoker    Packs/day: 0.50    Years: 20.00    Pack years: 10.00    Types: Cigarettes  . Smokeless tobacco: Never Used  . Tobacco comment: trying to quit-using e-cig some  Substance and Sexual Activity  . Alcohol use: Yes    Comment: occ  . Drug use: Yes    Types: Heroin, "Crack" cocaine, Marijuana    Comment: heroin last used 07/14/14, crack/ had some a few days ago  . Sexual activity: Yes    Partners: Male    Birth control/protection: Condom    Comment: Has been HIV tested in past and has been negative.  Lifestyle  . Physical activity:    Days per week: Patient refused    Minutes per session: Patient refused  . Stress: Rather much  Relationships  . Social connections:    Talks on phone: Patient refused    Gets together: Patient refused    Attends religious service: Patient refused    Active member of club or organization: Patient refused    Attends meetings of clubs or organizations: Patient refused    Relationship status: Patient refused  Other Topics Concern  . Not on file   Social History Narrative   Transgendered male.  Single.  Lives alone.  Independent.   Additional Social History:                         Sleep: Good  Appetite:  Good  Current Medications: Current Facility-Administered Medications  Medication Dose Route Frequency Provider Last Rate Last Dose  . acetaminophen (TYLENOL) tablet 650 mg  650 mg Oral Q6H PRN Nira Conn A, NP      . alum & mag hydroxide-simeth (MAALOX/MYLANTA) 200-200-20 MG/5ML suspension 30 mL  30 mL Oral Q4H PRN Jackelyn Poling, NP      . Melene Muller ON 03/15/2018] ARIPiprazole (ABILIFY) tablet 5 mg  5 mg Oral Daily Micheal Likens, MD      . Melene Muller ON 03/15/2018] escitalopram (LEXAPRO) tablet 20 mg  20 mg Oral Daily Micheal Likens, MD      . estradiol (ESTRACE) tablet 2 mg  2 mg Oral TID Jackelyn Poling, NP   2 mg at  03/14/18 1201  . finasteride (PROSCAR) tablet 2.5 mg  2.5 mg Oral Daily Nira Conn A, NP   2.5 mg at 03/14/18 0802  . hydrOXYzine (ATARAX/VISTARIL) tablet 25 mg  25 mg Oral Q6H PRN Jackelyn Poling, NP      . Influenza vac split quadrivalent PF (FLUARIX) injection 0.5 mL  0.5 mL Intramuscular Tomorrow-1000 Cobos, Rockey Situ, MD      . Ledipasvir-Sofosbuvir 90-400 MG TABS 1 tablet  1 tablet Oral Daily Nira Conn A, NP   1 tablet at 03/14/18 0802  . magnesium hydroxide (MILK OF MAGNESIA) suspension 30 mL  30 mL Oral Daily PRN Nira Conn A, NP      . mirtazapine (REMERON) tablet 7.5 mg  7.5 mg Oral QHS Nira Conn A, NP   7.5 mg at 03/13/18 2150  . naproxen (NAPROSYN) tablet 500 mg  500 mg Oral BID PRN Jackelyn Poling, NP      . ondansetron (ZOFRAN-ODT) disintegrating tablet 4 mg  4 mg Oral Q6H PRN Nira Conn A, NP      . prazosin (MINIPRESS) capsule 1 mg  1 mg Oral QHS Nira Conn A, NP   1 mg at 03/13/18 2150  . spironolactone (ALDACTONE) tablet 50 mg  50 mg Oral Daily Nira Conn A, NP   50 mg at 03/14/18 1610    Lab Results:  Results for orders placed or performed during the  hospital encounter of 03/13/18 (from the past 48 hour(s))  TSH     Status: None   Collection Time: 03/14/18  6:32 AM  Result Value Ref Range   TSH 1.040 0.350 - 4.500 uIU/mL    Comment: Performed by a 3rd Generation assay with a functional sensitivity of <=0.01 uIU/mL. Performed at [facility], [address] [place], [place] [address]     Blood Alcohol level:  Lab Results  Component Value Date   ETH <10 03/12/2018   ETH <10 06/12/2017    Metabolic Disorder Labs: Lab Results  Component Value Date   HGBA1C 5.4 11/17/2017   MPG 108 11/17/2017   MPG 105.41 06/12/2017   Lab Results  Component Value Date   PROLACTIN 14.2 11/17/2017   PROLACTIN 6.3 09/09/2016   Lab Results  Component Value Date   CHOL 152 11/17/2017   TRIG 87 11/17/2017   HDL 38 (L) 11/17/2017   CHOLHDL 4.0 11/17/2017   VLDL 17 11/17/2017   LDLCALC 97 11/17/2017   LDLCALC 91 06/12/2017    Physical Findings: AIMS: Facial and Oral Movements Muscles of Facial Expression: None, normal Lips and Perioral Area: None, normal Jaw: None, normal Tongue: None, normal,Extremity Movements Upper (arms, wrists, hands, fingers): None, normal Lower (legs, knees, ankles, toes): None, normal, Trunk Movements Neck, shoulders, hips: None, normal, Overall Severity Severity of abnormal movements (highest score from questions above): None, normal Incapacitation due to abnormal movements: None, normal Patient's awareness of abnormal movements (rate only patient's report): No Awareness, Dental Status Current problems with teeth and/or dentures?: No Does patient usually wear dentures?: No  CIWA:  CIWA-Ar Total: 2 COWS:  COWS Total Score: 2  Musculoskeletal: Strength & Muscle Tone: within normal limits Gait & Station: normal Patient leans: N/A  Psychiatric Specialty Exam: Physical Exam  Nursing note and vitals reviewed.   Review of Systems  Constitutional: Negative for chills and fever.   Respiratory: Negative for cough and shortness of breath.   Cardiovascular: Negative for chest pain.  Gastrointestinal: Negative for abdominal pain, heartburn, nausea and  vomiting.  Neurological: Speech change:   Psychiatric/Behavioral: Positive for depression. Negative for hallucinations and suicidal ideas. The patient is not nervous/anxious and does not have insomnia.     Blood pressure 116/76, pulse 81, temperature 98.2 F (36.8 C), temperature source Oral, resp. rate 18, height 5\' 8"  (1.727 m), weight 80.7 kg.Body mass index is 27.06 kg/m.  General Appearance: Casual  Eye Contact:  Fair  Speech:  Clear and Coherent and Normal Rate  Volume:  Normal  Mood:  Anxious and Depressed  Affect:  Congruent, Constricted and Depressed  Thought Process:  Coherent and Goal Directed  Orientation:  Full (Time, Place, and Person)  Thought Content:  Logical  Suicidal Thoughts:  No  Homicidal Thoughts:  No  Memory:  Immediate;   Fair Recent;   Fair Remote;   Fair  Judgement:  Fair  Insight:  Lacking  Psychomotor Activity:  Normal  Concentration:  Concentration: Fair  Recall:  [employer] of Knowledge:  Fair  Language:  Fair  Akathisia:  No  Handed:    AIMS (if indicated):     Assets:  Resilience Social Support  ADL's:  Intact  Cognition:  WNL  Sleep:  Number of Hours: 4.5   Treatment Plan Summary: Daily contact with patient to assess and evaluate symptoms and progress in treatment and Medication management   -Continue inpatient hospitalization  -MDD recurrent, severe, without psychotic features   -Change lexapro 10mg  po qDay to lexapro 20mg  po qDay  -Change abilify 2mg  po qDay to abilify 5mg  po qDay  -Hormone treatment for gender transition  -Continue estradiol 2mg  po TID   -Continue finasteride 2.5mg  po qDay   -Continue spironolactone 50mg  po qDay  -anxiety     -Continue vistaril 25mg  po q6h prn anxiety  -Hepatitis C   -Continue Harvoni 90-400mg  take 1 tablet po  qDay  -nightmares associated with previous trauma   -Continue prazosin 1mg  po qhs  -Encourage participation in groups and therapeutic milieu  -disposition planning will be ongoing  Micheal Likens, MD 03/14/2018, 12:46 PM

## 2018-03-14 NOTE — Tx Team (Signed)
Interdisciplinary Treatment and Diagnostic Plan Update  03/14/2018 Time of Session: 0830AM Randy [name] MRN: [id number]  Principal Diagnosis: Severe recurrent major depression without psychotic features [facility])  Secondary Diagnoses: Principal Problem:   Severe recurrent major depression without psychotic features (HCC)   Current Medications:  Current Facility-Administered Medications  Medication Dose Route Frequency Provider Last Rate Last Dose  . acetaminophen (TYLENOL) tablet 650 mg  650 mg Oral Q6H PRN Nira Conn A, NP      . alum & mag hydroxide-simeth (MAALOX/MYLANTA) 200-200-20 MG/5ML suspension 30 mL  30 mL Oral Q4H PRN Jackelyn Poling, NP      . Melene Muller ON 03/15/2018] ARIPiprazole (ABILIFY) tablet 5 mg  5 mg Oral Daily Micheal Likens, MD      . Melene Muller ON 03/15/2018] escitalopram (LEXAPRO) tablet 20 mg  20 mg Oral Daily Micheal Likens, MD      . estradiol (ESTRACE) tablet 2 mg  2 mg Oral TID Nira Conn A, NP   2 mg at 03/14/18 1201  . finasteride (PROSCAR) tablet 2.5 mg  2.5 mg Oral Daily Nira Conn A, NP   2.5 mg at 03/14/18 0802  . hydrOXYzine (ATARAX/VISTARIL) tablet 25 mg  25 mg Oral Q6H PRN Jackelyn Poling, NP      . Influenza vac split quadrivalent PF (FLUARIX) injection 0.5 mL  0.5 mL Intramuscular Tomorrow-1000 Cobos, Rockey Situ, MD      . Ledipasvir-Sofosbuvir 90-400 MG TABS 1 tablet  1 tablet Oral Daily Nira Conn A, NP   1 tablet at 03/14/18 0802  . magnesium hydroxide (MILK OF MAGNESIA) suspension 30 mL  30 mL Oral Daily PRN Nira Conn A, NP      . mirtazapine (REMERON) tablet 7.5 mg  7.5 mg Oral QHS Nira Conn A, NP   7.5 mg at 03/13/18 2150  . naproxen (NAPROSYN) tablet 500 mg  500 mg Oral BID PRN Nira Conn A, NP      . ondansetron (ZOFRAN-ODT) disintegrating tablet 4 mg  4 mg Oral Q6H PRN Nira Conn A, NP      . prazosin (MINIPRESS) capsule 1 mg  1 mg Oral QHS Nira Conn A, NP   1 mg at 03/13/18 2150  . spironolactone (ALDACTONE) tablet  50 mg  50 mg Oral Daily Nira Conn A, NP   50 mg at 03/14/18 0802   PTA Medications: Medications Prior to Admission  Medication Sig Dispense Refill Last Dose  . ARIPiprazole (ABILIFY) 2 MG tablet Take 1 tablet (2 mg total) by mouth daily. 30 tablet 0 Past Week at Unknown time  . escitalopram (LEXAPRO) 10 MG tablet Take 3 tablets (30 mg total) by mouth daily. 30 tablet 0 Past Week at Unknown time  . estradiol (ESTRACE) 2 MG tablet Take 1 tablet (2 mg total) by mouth 3 (three) times daily. For hormonal supplementation 90 tablet 0 Past Month at Unknown time  . finasteride (PROSCAR) 5 MG tablet Take 0.5 tablets (2.5 mg total) by mouth daily. For hormonal treatment for male transgender process: Take 1/2 tablet 15 tablet 0 Past Month at Unknown time  . hydrOXYzine (ATARAX/VISTARIL) 25 MG tablet Take 1 tablet (25 mg total) by mouth 3 (three) times daily as needed for anxiety. (Patient not taking: Reported on 03/12/2018) 30 tablet 0 Not Taking at Unknown time  . Ledipasvir-Sofosbuvir (HARVONI) 90-400 MG TABS Take 1 tablet by mouth daily. 28 tablet 2 03/11/2018 at 8am  . mirtazapine (REMERON) 7.5 MG tablet Take 1 tablet (7.5 mg  total) by mouth at bedtime. 30 tablet 0 Past Week at Unknown time  . nicotine (NICODERM CQ - DOSED IN MG/24 HOURS) 21 mg/24hr patch Place 1 patch (21 mg total) onto the skin daily. (May purchase from over the counter): For smoking cessation (Patient not taking: Reported on 06/21/2017) 28 patch 0 Not Taking at Unknown time  . prazosin (MINIPRESS) 1 MG capsule Take 1 capsule (1 mg total) by mouth at bedtime. 30 capsule 0 Past Week at Unknown time  . spironolactone (ALDACTONE) 50 MG tablet Take 1 tablet (50 mg total) by mouth daily. Part treatment for transgender process. 30 tablet 0 Past Month at Unknown time    Patient Stressors: Financial difficulties Medication change or noncompliance Substance abuse  Patient Strengths: Active sense of humor Average or above average  intelligence Capable of independent living Special hobby/interest  Treatment Modalities: Medication Management, Group therapy, Case management,  1 to 1 session with clinician, Psychoeducation, Recreational therapy.   Physician Treatment Plan for Primary Diagnosis: Severe recurrent major depression without psychotic features (HCC) Long Term Goal(s): Improvement in symptoms so as ready for discharge Improvement in symptoms so as ready for discharge   Short Term Goals: Ability to identify changes in lifestyle to reduce recurrence of condition will improve Ability to verbalize feelings will improve Ability to disclose and discuss suicidal ideas Ability to demonstrate self-control will improve Ability to identify and develop effective coping behaviors will improve Ability to maintain clinical measurements within normal limits will improve Compliance with prescribed medications will improve Ability to identify triggers associated with substance abuse/mental health issues will improve  Medication Management: Evaluate patient's response, side effects, and tolerance of medication regimen.  Therapeutic Interventions: 1 to 1 sessions, Unit Group sessions and Medication administration.  Evaluation of Outcomes: Progressing  Physician Treatment Plan for Secondary Diagnosis: Principal Problem:   Severe recurrent major depression without psychotic features (HCC)  Long Term Goal(s): Improvement in symptoms so as ready for discharge Improvement in symptoms so as ready for discharge   Short Term Goals: Ability to identify changes in lifestyle to reduce recurrence of condition will improve Ability to verbalize feelings will improve Ability to disclose and discuss suicidal ideas Ability to demonstrate self-control will improve Ability to identify and develop effective coping behaviors will improve Ability to maintain clinical measurements within normal limits will improve Compliance with prescribed  medications will improve Ability to identify triggers associated with substance abuse/mental health issues will improve     Medication Management: Evaluate patient's response, side effects, and tolerance of medication regimen.  Therapeutic Interventions: 1 to 1 sessions, Unit Group sessions and Medication administration.  Evaluation of Outcomes: Progressing   RN Treatment Plan for Primary Diagnosis: Severe recurrent major depression without psychotic features (HCC) Long Term Goal(s): Knowledge of disease and therapeutic regimen to maintain health will improve  Short Term Goals: Ability to remain free from injury will improve, Ability to verbalize frustration and anger appropriately will improve, Ability to disclose and discuss suicidal ideas and Ability to identify and develop effective coping behaviors will improve  Medication Management: RN will administer medications as ordered by provider, will assess and evaluate patient's response and provide education to patient for prescribed medication. RN will report any adverse and/or side effects to prescribing provider.  Therapeutic Interventions: 1 on 1 counseling sessions, Psychoeducation, Medication administration, Evaluate responses to treatment, Monitor vital signs and CBGs as ordered, Perform/monitor CIWA, COWS, AIMS and Fall Risk screenings as ordered, Perform wound care treatments as ordered.  Evaluation of  Outcomes: Progressing   LCSW Treatment Plan for Primary Diagnosis: Severe recurrent major depression without psychotic features (HCC) Long Term Goal(s): Safe transition to appropriate next level of care at discharge, Engage patient in therapeutic group addressing interpersonal concerns.  Short Term Goals: Engage patient in aftercare planning with referrals and resources, Facilitate patient progression through stages of change regarding substance use diagnoses and concerns and Identify triggers associated with mental health/substance  abuse issues  Therapeutic Interventions: Assess for all discharge needs, 1 to 1 time with Social worker, Explore available resources and support systems, Assess for adequacy in community support network, Educate family and significant other(s) on suicide prevention, Complete Psychosocial Assessment, Interpersonal group therapy.  Evaluation of Outcomes: Progressing   Progress in Treatment: Attending groups: Yes. Participating in groups: Yes. Taking medication as prescribed: Yes. Toleration medication: Yes. Family/Significant other contact made: SPE completed with pt; pt declined to consent to collateral contact.  Patient understands diagnosis: Yes. Discussing patient identified problems/goals with staff: Yes. Medical problems stabilized or resolved: Yes. Denies suicidal/homicidal ideation: No. Issues/concerns per patient self-inventory: Yes. Other: n/a  New problem(s) identified: No, Describe:  n/a  New Short Term/Long Term Goal(s): detox, medication management for mood stabilization; elimination of SI thoughts; development of comprehensive mental wellness/sobriety plan.   Patient Goals:  "To get into rehab from here. I need more help."   Discharge Plan or Barriers: Pt agreeable to [facility] and [facility] Referrals. [facility] pamphlet, Mobile Crisis information, and AA/NA information provided to patient for additional community support and resources.   Reason for Continuation of Hospitalization: Anxiety Depression Medication stabilization Suicidal ideation Withdrawal symptoms  Estimated Length of Stay: Thursday, 03/17/18  Attendees: Patient: 03/14/2018 12:40 PM  Physician: Dr. Altamese Manahawkin MD 03/14/2018 12:40 PM  Nursing: Arlyss Repress RN; Marchelle Folks RN 03/14/2018 12:40 PM  RN Care Manager:x 03/14/2018 12:40 PM  Social Worker: Corrie Mckusick LCSW 03/14/2018 12:40 PM  Recreational Therapist: x 03/14/2018 12:40 PM  Other: Armandina Stammer NP 03/14/2018 12:40 PM  Other:  03/14/2018 12:40 PM  Other: 03/14/2018  12:40 PM    Scribe for Treatment Team: Rona Ravens, LCSW 03/14/2018 12:40 PM

## 2018-03-15 ENCOUNTER — Encounter

## 2018-03-15 ENCOUNTER — Ambulatory Visit: Payer: Self-pay | Admitting: Family Medicine

## 2018-03-15 NOTE — B[facility] Group Notes (Signed)
[facility] Mental Health Association Group Therapy 03/15/2018 1:15pm  Type of Therapy: Mental Health Association Presentation  Participation Level: Active  Participation Quality: Attentive  Affect: Appropriate  Cognitive: Oriented  Insight: Developing/Improving  Engagement in Therapy: Engaged  Modes of Intervention: Discussion, Education and Socialization  Summary of Progress/Problems: Mental Health Association (MHA) Speaker came to talk about his personal journey with mental health. The pt processed ways by which to relate to the speaker. MHA speaker provided handouts and educational information pertaining to groups and services offered by the [facility]. Pt was engaged in speaker's presentation and was receptive to resources provided.    Rona Ravens, LCSW 03/15/2018 3:09 PM

## 2018-03-15 NOTE — Plan of Care (Signed)
  Problem: Education: Goal: Emotional status will improve Outcome: Progressing Goal: Mental status will improve Outcome: Progressing   D: Pt alert and oriented on the unit. Pt slept most of the day. Pt denies SI/HI, A/VH. Pt's affect was flat and mood depressed. Pt is pleasant and cooperative. A: Education, support and encouragement provided, q15 minute safety checks remain in effect. Medications administered per MD orders. R: No reactions/side effects to medicine noted. Pt denies any concerns at this time, and verbally contracts for safety. Pt ambulating on the unit with no issues. Pt remains safe on and off the unit.

## 2018-03-15 NOTE — Progress Notes (Signed)
Adult Psychoeducational Group Note  Date:  03/15/2018 Time:  9:43 PM  Group Topic/Focus:  Wrap-Up Group:   The focus of this group is to help patients review their daily goal of treatment and discuss progress on daily workbooks.  Participation Level:  Minimal  Participation Quality:  Appropriate  Affect:  Appropriate  Cognitive:  Appropriate  Insight: Appropriate  Engagement in Group:  Engaged  Modes of Intervention:  Discussion  Additional Comments:  Patient attended group and said that her day was a 4. Her positive word for today is good.   Kaily Wragg W Tynleigh Birt 03/15/2018, 9:43 PM

## 2018-03-15 NOTE — Progress Notes (Signed)
Patient ID: Randy [name], adult   DOB: [birth year], 47 y.o.   MRN: [id number]   D: Patient pleasant on approach tonight. Reports depressed mood and states that it has not improved much since first admitted. No active SI at present and contracts for safety. Just complains of nicotine cravings.  A: Staff will monitor on q 15 minute checks, follow treatment plan, and give medication as ordered. R: Cooperative on the unit. Took medication as ordered.

## 2018-03-15 NOTE — Plan of Care (Signed)
  Problem: Education: Goal: Emotional status will improve Outcome: Progressing Goal: Mental status will improve 03/15/2018 2037 by Tania Ade, RN Outcome: Progressing 03/15/2018 2031 by Tania Ade, RN Outcome: Progressing Goal: Verbalization of understanding the information provided will improve Outcome: Progressing

## 2018-03-15 NOTE — Progress Notes (Signed)
Recreation Therapy Notes  Animal-Assisted Activity (AAA) Program Checklist/Progress Notes Patient Eligibility Criteria Checklist & Daily Group note for Rec Tx Intervention  Date: 11.5.19 Time: 1430 Location: 400 Hall Dayroom   AAA/T Program Assumption of Risk Form signed by Patient/ or Parent Legal Guardian YES   Patient is free of allergies or sever asthma  YES   Patient reports no fear of animals  YES   Patient reports no history of cruelty to animals YES   Patient understands his/her participation is voluntary YES   Patient washes hands before animal contact  YES   Patient washes hands after animal contact  YES   Education: Hand Washing, Appropriate Animal Interaction   Education Outcome: Acknowledges understanding/In group clarification offered/Needs additional education.   Clinical Observations/Feedback: Pt did not attend group activity.    Dutchess Crosland, LRT/CTRS         Celester Morgan A 03/15/2018 3:57 PM 

## 2018-03-15 NOTE — Progress Notes (Signed)
[facility] MD Progress Note  03/15/2018 12:37 PM Randy [name]  MRN:  [id number] Subjective:    History as per psychiatric intake suicide risk assessment: 47 year old transgender male,known to our service from prior admissions, most recently in July 2019 for depression, suicidal ideations. At the time was diagnosed with MDD and Cocaine Use Disorder. Was discharged on Abilify, Lexapro, Remeron, Minipress. Patient presented for worsening depression, suicidal ideations, with thoughts of overdosing. Describes anhedonia, decreased appetite, decreased energy level, poor sleep. Denies hallucinations. States that a few weeks ago she ran into a friend of her deceased BF [name] ( who passed away in 05-26-17) and states " everything seemed to go downhill from there after that". She reports recently relapsing on cocaine and heroin . States she had used " constantly for about 10 days , but none before then". Of note, states she has not used heroin over the last 5 days. Admission UDS positive for cocaine , but not for opiates. Admission BAL negative. Denies any alcohol or BZD abuse . States she had stopped her psychiatric medications about a month ago.   As per evaluation today: Today upon evaluation, pt shares, "I'm doing okay." She denies any specific concerns aside from some ongoing depression. She is sleeping well. Her appetite is good. She denies other physical complaints. She denies SI/HI/AH/VH. She is tolerating her medications well, and she is in agreement to continue her current regimen without changes as we just increased dose of abilify and lexapro yesterday. Pt remains in agreement with referral to substance use treatment, and SW Team has secured [facility] intake screening for the 03/24/18; we will continue to monitor and treat pt's symptoms of depression, and we will try to move up her intake screening if at all possible. Pt was in agreement with the above plan, and she had no further questions, comments, or  concerns.  Principal Problem: Severe recurrent major depression without psychotic features (HCC) Diagnosis:   Patient Active Problem List   Diagnosis Date Noted  . Severe recurrent major depression without psychotic features (HCC) [F33.2] 11/16/2017  . Liver fibrosis [K74.0] 06/22/2017  . MDD (major depressive disorder), single episode, severe , no psychosis (HCC) [F32.2] 06/11/2017  . Cough [R05] 03/14/2015  . Major depressive disorder, recurrent, severe without psychotic features (HCC) [F33.2]   . Opioid dependence with opioid-induced mood disorder (HCC) [F11.24]   . Cocaine dependence with cocaine-induced mood disorder (HCC) [F14.24]   . MDD (major depressive disorder) [F32.9] 09/17/2014  . MDD (major depressive disorder), recurrent severe, without psychosis (HCC) [F33.2] 07/05/2014  . Major depression, recurrent (HCC) [F33.9] 04/10/2012  . Cocaine dependence (HCC) [F14.20] 04/10/2012  . Polysubstance abuse including IVDA (heroin), cocaine, marijuana [F19.10] 01/10/2012  . Elevated LFTs [R94.5] 01/10/2012  . Chronic hepatitis C without hepatic coma (HCC) [B18.2] 01/10/2012  . 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 Tobacco abuse [Z72.0] 01/10/2012  . Anxiety disorder [F41.9] 08/14/2011  . Recurrent major depression-severe (HCC) [F33.2] 08/13/2011   Total Time spent with patient: 30 minutes  Past Psychiatric History: see H&P  Past Medical History:  Past Medical History:  Diagnosis Date  . Cellulitis  Of  Left Forearm 01/10/2012   From IVDA  . Depression   . Hepatitis C   . 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 I&D EXTREMITY  01/13/2012   Procedure: IRRIGATION AND DEBRIDEMENT EXTREMITY;  Surgeon: Kennieth Rad, MD;  Location: [facility]S;  Service: Orthopedics;  Laterality: Left;   Family History:  Family History  Problem Relation Age of Onset  . Idiopathic pulmonary fibrosis  Father    Family Psychiatric  History: see H&P Social History:  Social History   Substance and Sexual Activity  Alcohol Use Yes   Comment: occ     Social History   Substance and Sexual Activity  Drug Use Yes  . Types: Heroin, "Crack" cocaine, Marijuana   Comment: heroin last used 07/14/14, crack/ had some a few days ago    Social History   Socioeconomic History  . Marital status: Single    Spouse name: Not on file  . Number of children: Not on file  . Years of education: Not on file  . Highest education level: Not on file  Occupational History  . 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 Financial resource strain: Very hard  . Food insecurity:    Worry: Often true    Inability: Often true  . Transportation needs:    Medical: Yes    Non-medical: Yes  Tobacco Use  . Smoking status: Current Every Day Smoker    Packs/day: 0.50    Years: 20.00    Pack years: 10.00    Types: Cigarettes  . Smokeless tobacco: Never Used  . Tobacco comment: trying to quit-using e-cig some  Substance and Sexual Activity  . Alcohol use: Yes    Comment: occ  . Drug use: Yes    Types: Heroin, "Crack" cocaine, Marijuana    Comment: heroin last used 07/14/14, crack/ had some a few days ago  . Sexual activity: Yes    Partners: Male    Birth control/protection: Condom    Comment: Has been HIV tested in past and has been negative.  Lifestyle  . Physical activity:    Days per week: Patient refused    Minutes per session: Patient refused  . Stress: Rather much  Relationships  . Social connections:    Talks on phone: Patient refused    Gets together: Patient refused    Attends religious service: Patient refused    Active member of club or organization: Patient refused    Attends meetings of clubs or organizations: Patient refused    Relationship status: Patient refused  Other Topics Concern  . Not on file  Social History Narrative   Transgendered male.  Single.   Lives alone.  Independent.   Additional Social History:                         Sleep: Good  Appetite:  Good  Current Medications: Current Facility-Administered Medications  Medication Dose Route Frequency Provider Last Rate Last Dose  . acetaminophen (TYLENOL) tablet 650 mg  650 mg Oral Q6H PRN Nira Conn A, NP      . alum & mag hydroxide-simeth (MAALOX/MYLANTA) 200-200-20 MG/5ML suspension 30 mL  30 mL Oral Q4H PRN Nira Conn A, NP      . ARIPiprazole (ABILIFY) tablet 5 mg  5 mg Oral Daily Micheal Likens, MD   5 mg at 03/15/18 1610  . escitalopram (LEXAPRO) tablet 20 mg  20 mg Oral Daily Micheal Likens, MD   20 mg at 03/15/18 9604  . estradiol (ESTRACE) tablet 2 mg  2 mg Oral TID Nira Conn A, NP   2 mg at 03/15/18 5409  . finasteride (PROSCAR) tablet 2.5 mg  2.5 mg Oral Daily Nira Conn  A, NP   2.5 mg at 03/15/18 0821  . hydrOXYzine (ATARAX/VISTARIL) tablet 25 mg  25 mg Oral Q6H PRN Jackelyn Poling, NP      . Influenza vac split quadrivalent PF (FLUARIX) injection 0.5 mL  0.5 mL Intramuscular Tomorrow-1000 Cobos, Rockey Situ, MD      . Ledipasvir-Sofosbuvir 90-400 MG TABS 1 tablet  1 tablet Oral Daily Nira Conn A, NP   1 tablet at 03/15/18 0823  . magnesium hydroxide (MILK OF MAGNESIA) suspension 30 mL  30 mL Oral Daily PRN Nira Conn A, NP      . mirtazapine (REMERON) tablet 7.5 mg  7.5 mg Oral QHS Nira Conn A, NP   7.5 mg at 03/14/18 2105  . naproxen (NAPROSYN) tablet 500 mg  500 mg Oral BID PRN Nira Conn A, NP      . nicotine polacrilex (NICORETTE) gum 2 mg  2 mg Oral PRN Cobos, Rockey Situ, MD   2 mg at 03/15/18 0826  . ondansetron (ZOFRAN-ODT) disintegrating tablet 4 mg  4 mg Oral Q6H PRN Nira Conn A, NP      . prazosin (MINIPRESS) capsule 1 mg  1 mg Oral QHS Nira Conn A, NP   1 mg at 03/14/18 2105  . spironolactone (ALDACTONE) tablet 50 mg  50 mg Oral Daily Nira Conn A, NP   50 mg at 03/15/18 9604    Lab Results:  Results  for orders placed or performed during the hospital encounter of 03/13/18 (from the past 48 hour(s))  TSH     Status: None   Collection Time: 03/14/18  6:32 AM  Result Value Ref Range   TSH 1.040 0.350 - 4.500 uIU/mL    Comment: Performed by a 3rd Generation assay with a functional sensitivity of <=0.01 uIU/mL. Performed at [facility], [address], [place], [place] [address]     Blood Alcohol level:  Lab Results  Component Value Date   ETH <10 03/12/2018   ETH <10 06/12/2017    Metabolic Disorder Labs: Lab Results  Component Value Date   HGBA1C 5.4 11/17/2017   MPG 108 11/17/2017   MPG 105.41 06/12/2017   Lab Results  Component Value Date   PROLACTIN 14.2 11/17/2017   PROLACTIN 6.3 09/09/2016   Lab Results  Component Value Date   CHOL 152 11/17/2017   TRIG 87 11/17/2017   HDL 38 (L) 11/17/2017   CHOLHDL 4.0 11/17/2017   VLDL 17 11/17/2017   LDLCALC 97 11/17/2017   LDLCALC 91 06/12/2017    Physical Findings: AIMS: Facial and Oral Movements Muscles of Facial Expression: None, normal Lips and Perioral Area: None, normal Jaw: None, normal Tongue: None, normal,Extremity Movements Upper (arms, wrists, hands, fingers): None, normal Lower (legs, knees, ankles, toes): None, normal, Trunk Movements Neck, shoulders, hips: None, normal, Overall Severity Severity of abnormal movements (highest score from questions above): None, normal Incapacitation due to abnormal movements: None, normal Patient's awareness of abnormal movements (rate only patient's report): No Awareness, Dental Status Current problems with teeth and/or dentures?: No Does patient usually wear dentures?: No  CIWA:  CIWA-Ar Total: 2 COWS:  COWS Total Score: 2  Musculoskeletal: Strength & Muscle Tone: within normal limits Gait & Station: normal Patient leans: N/A  Psychiatric Specialty Exam: Physical Exam  Nursing note and vitals reviewed.   Review of Systems  Constitutional:  Negative for chills and fever.  Respiratory: Negative for cough and shortness of breath.   Cardiovascular: Negative for chest pain.  Gastrointestinal: Negative for abdominal pain, heartburn, nausea and vomiting.  Psychiatric/Behavioral: Positive for depression. Negative for hallucinations and suicidal ideas. The patient is not nervous/anxious and does not have insomnia.     Blood pressure 111/77, pulse 78, temperature 97.6 F (36.4 C), temperature source Oral, resp. rate 20, height 5\' 8"  (1.727 m), weight 80.7 kg.Body mass index is 27.06 kg/m.  General Appearance: Casual and Fairly Groomed  Eye Contact:  Good  Speech:  Clear and Coherent and Normal Rate  Volume:  Normal  Mood:  Anxious and Depressed  Affect:  Congruent, Constricted and Depressed  Thought Process:  Coherent and Goal Directed  Orientation:  Full (Time, Place, and Person)  Thought Content:  Logical  Suicidal Thoughts:  No  Homicidal Thoughts:  No  Memory:  Immediate;   Fair Recent;   Fair Remote;   Fair  Judgement:  Fair  Insight:  Lacking  Psychomotor Activity:  Normal  Concentration:  Concentration: Fair  Recall:  [employer] of Knowledge:  Fair  Language:  Fair  Akathisia:  No  Handed:    AIMS (if indicated):     Assets:  Resilience Social Support  ADL's:  Intact  Cognition:  WNL  Sleep:  Number of Hours: 6.75   Treatment Plan Summary: Daily contact with patient to assess and evaluate symptoms and progress in treatment and Medication management   -Continue inpatient hospitalization  -MDD recurrent, severe, without psychotic features             -Continue lexapro 20mg  po qDay             -Continue abilify 5mg  po qDay  -Hormone treatment for gender transition             -Continue estradiol 2mg  po TID             -Continue finasteride 2.5mg  po qDay             -Continue spironolactone 50mg  po qDay  -anxiety                        -Continue vistaril 25mg  po q6h prn anxiety  -Hepatitis C              -Continue Harvoni 90-400mg  take 1 tablet po qDay  -nightmares associated with previous trauma             -Continue prazosin 1mg  po qhs  -Encourage participation in groups and therapeutic milieu  -disposition planning will be ongoing  Micheal Likens, MD 03/15/2018, 12:37 PM

## 2018-03-16 MED ORDER — NICOTINE 21 MG/24HR TD PT24
21.0000 mg | MEDICATED_PATCH | Freq: Every day | TRANSDERMAL | Status: DC
  Administered 2018-03-16 – 2018-03-18 (×2): 21 mg via TRANSDERMAL
  Filled 2018-03-16 (×2): qty 1
  Filled 2018-03-16 (×2): qty 14
  Filled 2018-03-16 (×7): qty 1

## 2018-03-16 NOTE — [facility] Group Notes (Signed)
LCSW Group Therapy Note   03/16/2018 1:15pm   Type of Therapy and Topic:  Group Therapy:  Overcoming Obstacles   Participation Level:  Did Not Attend--pt invited. Chose to remain in bed.    Description of Group:    In this group patients will be encouraged to explore what they see as obstacles to their own wellness and recovery. They will be guided to discuss their thoughts, feelings, and behaviors related to these obstacles. The group will process together ways to cope with barriers, with attention given to specific choices patients can make. Each patient will be challenged to identify changes they are motivated to make in order to overcome their obstacles. This group will be process-oriented, with patients participating in exploration of their own experiences as well as giving and receiving support and challenge from other group members.   Therapeutic Goals: 1. Patient will identify personal and current obstacles as they relate to admission. 2. Patient will identify barriers that currently interfere with their wellness or overcoming obstacles.  3. Patient will identify feelings, thought process and behaviors related to these barriers. 4. Patient will identify two changes they are willing to make to overcome these obstacles:      Summary of Patient Progress   x   Therapeutic Modalities:   Cognitive Behavioral Therapy Solution Focused Therapy Motivational Interviewing Relapse Prevention Therapy  Rona Ravens, LCSW 03/16/2018 12:38 PM

## 2018-03-16 NOTE — Plan of Care (Signed)
  Problem: Activity: Goal: Interest or engagement in activities will improve Outcome: Not Progressing   D: Pt alert and oriented on the unit. Pt denies SI/HI, A/VH, and pain. Pt's affect was flat and mood depressed. Pt was isolative and slept in his room for most of the day. Pt was encouraged to attend groups and unit activities, but she refused to participate. Pt did go off the unit for meals and ambulated in the hallway during the day. Pt is pleasant and cooperative. A: Education, support and encouragement provided, q15 minute safety checks remain in effect. Medications administered per MD orders. R: No reactions/side effects to medicine noted. Pt denies any concerns at this time, and verbally contracts for safety. Pt ambulating on the unit with no issues. Pt remains safe on and off the unit.

## 2018-03-16 NOTE — Progress Notes (Signed)
Adult Psychoeducational Group Note  Date:  03/16/2018 Time:  6:27 PM  Group Topic/Focus:  Goals Group:   The focus of this group is to help patients establish daily goals to achieve during treatment and discuss how the patient can incorporate goal setting into their daily lives to aide in recovery.  Participation Level:  Active  Participation Quality:  Appropriate  Affect:  Appropriate  Cognitive:  Alert  Insight: Appropriate  Engagement in Group:  Engaged  Modes of Intervention:  Discussion  Additional Comments:  Pt attended group and participated in group discussion.  Antonina Deziel R Palmer Fahrner 03/16/2018, 6:27 PM

## 2018-03-16 NOTE — Progress Notes (Signed)
[facility] MD Progress Note  03/16/2018 11:00 AM [name]  MRN:  [id number]  Subjective: Cypher reports, "I'm doing a little better today. Yet, I'm still feeling very depressed. The reason for the worsening depression is, I was off my medicines for about 2 weeks prior to getting admitted here this time. I have started back on the medicines, waiting for it to take full effect. I'm attending group sessions".   History as per psychiatric intake suicide risk assessment: 47 year old transgender male,known to our service from prior admissions, most recently in July 2019 for depression, suicidal ideations. At the time was diagnosed with MDD and Cocaine Use Disorder. Was discharged on Abilify, Lexapro, Remeron, Minipress. Patient presented for worsening depression, suicidal ideations, with thoughts of overdosing. Describes anhedonia, decreased appetite, decreased energy level, poor sleep. Denies hallucinations. States that a few weeks ago she ran into a friend of her deceased BF [name] ( who passed away in 05-23-17) and states " everything seemed to go downhill from there after that". She reports recently relapsing on cocaine and heroin . States she had used " constantly for about 10 days , but none before then". Of note, states she has not used heroin over the last 5 days. Admission UDS positive for cocaine , but not for opiates. Admission BAL negative. Denies any alcohol or BZD abuse . States she had stopped her psychiatric medications about a month ago.   As per evaluation today: Today upon evaluation, pt shares, "I'm doing a little better now that she is back on her medications". She says the reason for her worsening depression is because she was off her medications for 2 weeks. She is  She denies any specific concerns aside from some ongoing depression. She is sleeping well. Her appetite is good. She denies other physical complaints. She denies SI/HI/AH/VH. She is tolerating her medications well, and she  is in agreement to continue her current regimen without changes. Increased dose of abilify and lexapro were done yesterday. Pt remains in agreement with referral to substance use treatment, and SW Team has secured [facility] intake screening for the 03/24/18; we will continue to monitor and treat pt's symptoms of depression, and we will try to move up her intake screening if at all possible. Pt was in agreement with the above plan, and she had no further questions, comments, or concerns.  Principal Problem: Severe recurrent major depression without psychotic features (HCC)  Diagnosis:   Patient Active Problem List   Diagnosis Date Noted  . Severe recurrent major depression without psychotic features (HCC) [F33.2] 11/16/2017  . Liver fibrosis [K74.0] 06/22/2017  . MDD (major depressive disorder), single episode, severe , no psychosis (HCC) [F32.2] 06/11/2017  . Cough [R05] 03/14/2015  . Major depressive disorder, recurrent, severe without psychotic features (HCC) [F33.2]   . Opioid dependence with opioid-induced mood disorder (HCC) [F11.24]   . Cocaine dependence with cocaine-induced mood disorder (HCC) [F14.24]   . MDD (major depressive disorder) [F32.9] 09/17/2014  . MDD (major depressive disorder), recurrent severe, without psychosis (HCC) [F33.2] 07/05/2014  . Major depression, recurrent (HCC) [F33.9] 04/10/2012  . Cocaine dependence (HCC) [F14.20] 04/10/2012  . Polysubstance abuse including IVDA (heroin), cocaine, marijuana [F19.10] 01/10/2012  . Elevated LFTs [R94.5] 01/10/2012  . Chronic hepatitis C without hepatic coma (HCC) [B18.2] 01/10/2012  . 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 Tobacco abuse [Z72.0] 01/10/2012  . Anxiety disorder [F41.9] 08/14/2011  . Recurrent major depression-severe (HCC) [F33.2] 08/13/2011   Total  Time spent with patient: 15 minutes  Past Psychiatric History: See H&P  Past Medical History:  Past Medical History:  Diagnosis  Date  . Cellulitis  Of  Left Forearm 01/10/2012   From IVDA  . Depression   . Hepatitis C   . 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 I&D EXTREMITY  01/13/2012   Procedure: IRRIGATION AND DEBRIDEMENT EXTREMITY;  Surgeon: Kennieth Rad, MD;  Location: [facility];  Service: Orthopedics;  Laterality: Left;   Family History:  Family History  Problem Relation Age of Onset  . Idiopathic pulmonary fibrosis Father    Family Psychiatric  History: See H&P  Social History:  Social History   Substance and Sexual Activity  Alcohol Use Yes   Comment: occ     Social History   Substance and Sexual Activity  Drug Use Yes  . Types: Heroin, "Crack" cocaine, Marijuana   Comment: heroin last used 07/14/14, crack/ had some a few days ago    Social History   Socioeconomic History  . Marital status: Single    Spouse name: Not on file  . Number of children: Not on file  . Years of education: Not on file  . Highest education level: Not on file  Occupational History  . 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 Financial resource strain: Very hard  . Food insecurity:    Worry: Often true    Inability: Often true  . Transportation needs:    Medical: Yes    Non-medical: Yes  Tobacco Use  . Smoking status: Current Every Day Smoker    Packs/day: 0.50    Years: 20.00    Pack years: 10.00    Types: Cigarettes  . Smokeless tobacco: Never Used  . Tobacco comment: trying to quit-using e-cig some  Substance and Sexual Activity  . Alcohol use: Yes    Comment: occ  . Drug use: Yes    Types: Heroin, "Crack" cocaine, Marijuana    Comment: heroin last used 07/14/14, crack/ had some a few days ago  . Sexual activity: Yes    Partners: Male    Birth control/protection: Condom    Comment: Has been HIV tested in past and has been negative.  Lifestyle  . Physical activity:    Days per week: Patient refused     Minutes per session: Patient refused  . Stress: Rather much  Relationships  . Social connections:    Talks on phone: Patient refused    Gets together: Patient refused    Attends religious service: Patient refused    Active member of club or organization: Patient refused    Attends meetings of clubs or organizations: Patient refused    Relationship status: Patient refused  Other Topics Concern  . Not on file  Social History Narrative   Transgendered male.  Single.  Lives alone.  Independent.   Additional Social History:   Sleep: Good  Appetite:  Good  Current Medications: Current Facility-Administered Medications  Medication Dose Route Frequency Provider Last Rate Last Dose  . acetaminophen (TYLENOL) tablet 650 mg  650 mg Oral Q6H PRN Nira Conn A, NP      . alum & mag hydroxide-simeth (MAALOX/MYLANTA) 200-200-20 MG/5ML suspension 30 mL  30 mL Oral Q4H PRN Nira Conn A, NP      . ARIPiprazole (ABILIFY) tablet 5 mg  5 mg Oral Daily [place], Burlene Arnt, MD  5 mg at 03/16/18 0847  . escitalopram (LEXAPRO) tablet 20 mg  20 mg Oral Daily Micheal Likens, MD   20 mg at 03/16/18 0845  . estradiol (ESTRACE) tablet 2 mg  2 mg Oral TID Nira Conn A, NP   2 mg at 03/16/18 1026  . finasteride (PROSCAR) tablet 2.5 mg  2.5 mg Oral Daily Nira Conn A, NP   2.5 mg at 03/16/18 0845  . hydrOXYzine (ATARAX/VISTARIL) tablet 25 mg  25 mg Oral Q6H PRN Jackelyn Poling, NP      . Ledipasvir-Sofosbuvir 90-400 MG TABS 1 tablet  1 tablet Oral Daily Nira Conn A, NP   1 tablet at 03/16/18 1022  . magnesium hydroxide (MILK OF MAGNESIA) suspension 30 mL  30 mL Oral Daily PRN Nira Conn A, NP      . mirtazapine (REMERON) tablet 7.5 mg  7.5 mg Oral QHS Nira Conn A, NP   7.5 mg at 03/15/18 2129  . naproxen (NAPROSYN) tablet 500 mg  500 mg Oral BID PRN Nira Conn A, NP      . nicotine polacrilex (NICORETTE) gum 2 mg  2 mg Oral PRN Cobos, Rockey Situ, MD   2 mg at 03/15/18 2033  .  ondansetron (ZOFRAN-ODT) disintegrating tablet 4 mg  4 mg Oral Q6H PRN Nira Conn A, NP      . prazosin (MINIPRESS) capsule 1 mg  1 mg Oral QHS Nira Conn A, NP   1 mg at 03/15/18 2129  . spironolactone (ALDACTONE) tablet 50 mg  50 mg Oral Daily Nira Conn A, NP   50 mg at 03/16/18 0845   Lab Results:  No results found for this or any previous visit (from the past 48 hour(s)).  Blood Alcohol level:  Lab Results  Component Value Date   ETH <10 03/12/2018   ETH <10 06/12/2017   Metabolic Disorder Labs: Lab Results  Component Value Date   HGBA1C 5.4 11/17/2017   MPG 108 11/17/2017   MPG 105.41 06/12/2017   Lab Results  Component Value Date   PROLACTIN 14.2 11/17/2017   PROLACTIN 6.3 09/09/2016   Lab Results  Component Value Date   CHOL 152 11/17/2017   TRIG 87 11/17/2017   HDL 38 (L) 11/17/2017   CHOLHDL 4.0 11/17/2017   VLDL 17 11/17/2017   LDLCALC 97 11/17/2017   LDLCALC 91 06/12/2017   Physical Findings: AIMS: Facial and Oral Movements Muscles of Facial Expression: None, normal Lips and Perioral Area: None, normal Jaw: None, normal Tongue: None, normal,Extremity Movements Upper (arms, wrists, hands, fingers): None, normal Lower (legs, knees, ankles, toes): None, normal, Trunk Movements Neck, shoulders, hips: None, normal, Overall Severity Severity of abnormal movements (highest score from questions above): None, normal Incapacitation due to abnormal movements: None, normal Patient's awareness of abnormal movements (rate only patient's report): No Awareness, Dental Status Current problems with teeth and/or dentures?: No Does patient usually wear dentures?: No  CIWA:  CIWA-Ar Total: 2 COWS:  COWS Total Score: 2  Musculoskeletal: Strength & Muscle Tone: within normal limits Gait & Station: normal Patient leans: N/A  Psychiatric Specialty Exam: Physical Exam  Nursing note and vitals reviewed.   Review of Systems  Constitutional: Negative for chills and  fever.  Respiratory: Negative for cough and shortness of breath.   Cardiovascular: Negative for chest pain.  Gastrointestinal: Negative for abdominal pain, heartburn, nausea and vomiting.  Psychiatric/Behavioral: Positive for depression. Negative for hallucinations and suicidal ideas. The patient is not nervous/anxious and does  not have insomnia.     Blood pressure 111/77, pulse 78, temperature 97.6 F (36.4 C), temperature source Oral, resp. rate 20, height 5\' 8"  (1.727 m), weight 80.7 kg.Body mass index is 27.06 kg/m.  General Appearance: Casual and Fairly Groomed  Eye Contact:  Good  Speech:  Clear and Coherent and Normal Rate  Volume:  Normal  Mood:  Anxious and Depressed  Affect:  Congruent, Constricted and Depressed  Thought Process:  Coherent and Goal Directed  Orientation:  Full (Time, Place, and Person)  Thought Content:  Logical  Suicidal Thoughts:  No  Homicidal Thoughts:  No  Memory:  Immediate;   Fair Recent;   Fair Remote;   Fair  Judgement:  Fair  Insight:  Lacking  Psychomotor Activity:  Normal  Concentration:  Concentration: Fair  Recall:  [employer] of Knowledge:  Fair  Language:  Fair  Akathisia:  No  Handed:    AIMS (if indicated):     Assets:  Resilience Social Support  ADL's:  Intact  Cognition:  WNL  Sleep:  Number of Hours: 6.25   Treatment Plan Summary: Daily contact with patient to assess and evaluate symptoms and progress in treatment and Medication management   -Continue inpatient hospitalization.  -Will continue today 03/16/2018 plan as below except where it is noted.  -MDD recurrent, severe, without psychotic features             -Continue lexapro 20 mg po Q Day             -Continue abilify 5mg  po qDay  -Hormone treatment for gender transition             -Continue estradiol 2 mg po TID             -Continue finasteride 2.5 mg po Q Day             -Continue spironolactone 50 mg po qDay  -anxiety                        -Continue  vistaril 25mg  po q6h prn anxiety  -Hepatitis C             -Continue Harvoni 90-400 mg take 1 tablet po qDay  -nightmares associated with previous trauma             -Continue prazosin 1mg  po qhs  -Encourage participation in groups and therapeutic milieu  -disposition planning will be ongoing  Armandina Stammer, NP, PMHNP, FNP-BC 03/16/2018, 11:00 AMPatient ID: Randy [name], adult   DOB: 1971-04-25, 47 y.o.   MRN: [id number]

## 2018-03-17 NOTE — Progress Notes (Signed)
Patient ID: Randy [name], adult   DOB: [birth year], 47 y.o.   MRN: [id number] DAR NOTE: Pt calm and cooperative earlier in shift, denied SI/HI/AVH, took all of her medications as scheduled.  Pt denies SI/HI/AVH, Q15 minute checks being maintained for safety. Q15 minute checks being maintained for safety, will continue to monitor.

## 2018-03-17 NOTE — [facility] Group Notes (Signed)
LCSW Group Therapy Note  03/17/2018 1:15pm  Type of Therapy and Topic:  Group Therapy: Avoiding Self-Sabotaging and Enabling Behaviors  Participation Level:  Did Not Attend--pt invited. Chose to remain in bed.   Description of Group:   In this group, patients will learn how to identify obstacles, self-sabotaging and enabling behaviors, as well as: what are they, why do we do them and what needs these behaviors meet. Discuss unhealthy relationships and how to have positive healthy boundaries with those that sabotage and enable. Explore aspects of self-sabotage and enabling in yourself and how to limit these self-destructive behaviors in everyday life.   Therapeutic Goals: 1. Patient will identify one obstacle that relates to self-sabotage and enabling behaviors 2. Patient will identify one personal self-sabotaging or enabling behavior they did prior to admission 3. Patient will state a plan to change the above identified behavior 4. Patient will demonstrate ability to communicate their needs through discussion and/or role play.   Summary of Patient Progress:   x  Therapeutic Modalities:   Cognitive Behavioral Therapy Person-Centered Therapy Motivational Interviewing   Smayan Hackbart S Seiji Wiswell, LCSW 03/17/2018 11:50 AM  

## 2018-03-17 NOTE — Plan of Care (Addendum)
Patient self inventory- Patient slept fair last night, sleep medication was not requested. Energy level low, concentration poor, appetite fair. Depression, hopelessness, and anxiety rated 6, 6, 6 out of 10. Denies SI HI AVH. Denies physical pain. Patient's goal is "stay off nicotine patch."  Patient is compliant with medications prescribed. No side effects noted. Safety is maintained with 15 minute checks as well as environmental checks. Will continue to monitor.  Problem: Activity: Goal: Interest or engagement in activities will improve Outcome: Progressing   Problem: Education: Goal: Emotional status will improve Outcome: Not Progressing Goal: Mental status will improve Outcome: Not Progressing Goal: Verbalization of understanding the information provided will improve Outcome: Not Progressing   Problem: Activity: Goal: Sleeping patterns will improve Outcome: Not Progressing

## 2018-03-17 NOTE — Progress Notes (Signed)
[facility] MD Progress Note  03/17/2018 11:35 AM Randy [name]  MRN:  [id number] Subjective:    History as per psychiatric intake suicide risk assessment: 47 year old transgender male,known to our service from prior admissions, most recently in July 2019 for depression, suicidal ideations. At the time was diagnosed with MDD and Cocaine Use Disorder. Was discharged on Abilify, Lexapro, Remeron, Minipress. Patient presented for worsening depression, suicidal ideations, with thoughts of overdosing. Describes anhedonia, decreased appetite, decreased energy level, poor sleep. Denies hallucinations. States that a few weeks ago she ran into a friend of her deceased BF Randy [name] ( who passed away in 2017/05/24) and states " everything seemed to go downhill from there after that". She reports recently relapsing on cocaine and heroin . States she had used " constantly for about 10 days , but none before then". Of note, states she has not used heroin over the last 5 days. Admission UDS positive for cocaine , but not for opiates. Admission BAL negative. Denies any alcohol or BZD abuse . States she had stopped her psychiatric medications about a month ago.  As per evaluation today: Today upon evaluation, pt shares, "I'm pretty good." She reports some ongoing depression, but notes that it is improved overall during her stay. She is sleeping well. Her appetite is good. She denies other physical complaints. She denies SI/HI/AH/VH. She is tolerating her medications well, and she is in agreement to continue her current regimen without changes. Pt remains in agreement with referral to substance use treatment, and SW team was present to discuss current referrals - pt has [facility] intake screening for 03/23/18 and we presented alternative option of referral to [facility] in [place], which may have sooner availability. Pt expresses hesitance with this option and she explains, "I'm afraid of the [facility] because of their views on  queer people." Discussed with patient that we can provide her with more information on [facility] and keep her current [facility] intake appointment as a backup. Pt was in agreement with the above plan, and she had no further questions, comments, or concerns.  Principal Problem: Severe recurrent major depression without psychotic features (HCC) Diagnosis:   Patient Active Problem List   Diagnosis Date Noted  . Severe recurrent major depression without psychotic features (HCC) [F33.2] 11/16/2017  . Liver fibrosis [K74.0] 06/22/2017  . MDD (major depressive disorder), single episode, severe , no psychosis (HCC) [F32.2] 06/11/2017  . Cough [R05] 03/14/2015  . Major depressive disorder, recurrent, severe without psychotic features (HCC) [F33.2]   . Opioid dependence with opioid-induced mood disorder (HCC) [F11.24]   . Cocaine dependence with cocaine-induced mood disorder (HCC) [F14.24]   . MDD (major depressive disorder) [F32.9] 09/17/2014  . MDD (major depressive disorder), recurrent severe, without psychosis (HCC) [F33.2] 07/05/2014  . Major depression, recurrent (HCC) [F33.9] 04/10/2012  . Cocaine dependence (HCC) [F14.20] 04/10/2012  . Polysubstance abuse including IVDA (heroin), cocaine, marijuana [F19.10] 01/10/2012  . Elevated LFTs [R94.5] 01/10/2012  . Chronic hepatitis C without hepatic coma (HCC) [B18.2] 01/10/2012  . 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 Tobacco abuse [Z72.0] 01/10/2012  . Anxiety disorder [F41.9] 08/14/2011  . Recurrent major depression-severe (HCC) [F33.2] 08/13/2011   Total Time spent with patient: 30 minutes  Past Psychiatric History: see H&P  Past Medical History:  Past Medical History:  Diagnosis Date  . Cellulitis  Of  Left Forearm 01/10/2012   From IVDA  . Depression   . Hepatitis C   . Hormonal imbalance  in transgender patient 01/10/2012    Past Surgical History:  Procedure Laterality Date  . I&D EXTREMITY  01/13/2012    Procedure: IRRIGATION AND DEBRIDEMENT EXTREMITY;  Surgeon: Kennieth Rad, MD;  Location: [facility];  Service: Orthopedics;  Laterality: Left;   Family History:  Family History  Problem Relation Age of Onset  . Idiopathic pulmonary fibrosis Father    Family Psychiatric  History: see H&P Social History:  Social History   Substance and Sexual Activity  Alcohol Use Yes   Comment: occ     Social History   Substance and Sexual Activity  Drug Use Yes  . Types: Heroin, "Crack" cocaine, Marijuana   Comment: heroin last used 07/14/14, crack/ had some a few days ago    Social History   Socioeconomic History  . Marital status: Single    Spouse name: Not on file  . Number of children: Not on file  . Years of education: Not on file  . Highest education level: Not on file  Occupational History  . 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 Financial resource strain: Very hard  . Food insecurity:    Worry: Often true    Inability: Often true  . Transportation needs:    Medical: Yes    Non-medical: Yes  Tobacco Use  . Smoking status: Current Every Day Smoker    Packs/day: 0.50    Years: 20.00    Pack years: 10.00    Types: Cigarettes  . Smokeless tobacco: Never Used  . Tobacco comment: trying to quit-using e-cig some  Substance and Sexual Activity  . Alcohol use: Yes    Comment: occ  . Drug use: Yes    Types: Heroin, "Crack" cocaine, Marijuana    Comment: heroin last used 07/14/14, crack/ had some a few days ago  . Sexual activity: Yes    Partners: Male    Birth control/protection: Condom    Comment: Has been HIV tested in past and has been negative.  Lifestyle  . Physical activity:    Days per week: Patient refused    Minutes per session: Patient refused  . Stress: Rather much  Relationships  . Social connections:    Talks on phone: Patient refused    Gets together: Patient refused    Attends religious service: Patient refused    Active  member of club or organization: Patient refused    Attends meetings of clubs or organizations: Patient refused    Relationship status: Patient refused  Other Topics Concern  . Not on file  Social History Narrative   Transgendered male.  Single.  Lives alone.  Independent.   Additional Social History:                         Sleep: Good  Appetite:  Good  Current Medications: Current Facility-Administered Medications  Medication Dose Route Frequency Provider Last Rate Last Dose  . acetaminophen (TYLENOL) tablet 650 mg  650 mg Oral Q6H PRN Nira Conn A, NP      . alum & mag hydroxide-simeth (MAALOX/MYLANTA) 200-200-20 MG/5ML suspension 30 mL  30 mL Oral Q4H PRN Nira Conn A, NP      . ARIPiprazole (ABILIFY) tablet 5 mg  5 mg Oral Daily Micheal Likens, MD   5 mg at 03/17/18 0814  . escitalopram (LEXAPRO) tablet 20 mg  20 mg Oral Daily Micheal Likens, MD   20 mg at 03/17/18  1610  . estradiol (ESTRACE) tablet 2 mg  2 mg Oral TID Nira Conn A, NP   2 mg at 03/17/18 1116  . finasteride (PROSCAR) tablet 2.5 mg  2.5 mg Oral Daily Nira Conn A, NP   2.5 mg at 03/17/18 0814  . hydrOXYzine (ATARAX/VISTARIL) tablet 25 mg  25 mg Oral Q6H PRN Jackelyn Poling, NP      . Ledipasvir-Sofosbuvir 90-400 MG TABS 1 tablet  1 tablet Oral Daily Nira Conn A, NP   1 tablet at 03/17/18 0814  . magnesium hydroxide (MILK OF MAGNESIA) suspension 30 mL  30 mL Oral Daily PRN Nira Conn A, NP      . mirtazapine (REMERON) tablet 7.5 mg  7.5 mg Oral QHS Nira Conn A, NP   7.5 mg at 03/16/18 2128  . naproxen (NAPROSYN) tablet 500 mg  500 mg Oral BID PRN Nira Conn A, NP      . nicotine (NICODERM CQ - dosed in mg/24 hours) patch 21 mg  21 mg Transdermal Daily Micheal Likens, MD   21 mg at 03/16/18 1607  . nicotine polacrilex (NICORETTE) gum 2 mg  2 mg Oral PRN Cobos, Rockey Situ, MD   2 mg at 03/17/18 1116  . ondansetron (ZOFRAN-ODT) disintegrating tablet 4 mg  4 mg  Oral Q6H PRN Nira Conn A, NP      . prazosin (MINIPRESS) capsule 1 mg  1 mg Oral QHS Nira Conn A, NP   1 mg at 03/16/18 2128  . spironolactone (ALDACTONE) tablet 50 mg  50 mg Oral Daily Nira Conn A, NP   50 mg at 03/17/18 9604    Lab Results: No results found for this or any previous visit (from the past 48 hour(s)).  Blood Alcohol level:  Lab Results  Component Value Date   ETH <10 03/12/2018   ETH <10 06/12/2017    Metabolic Disorder Labs: Lab Results  Component Value Date   HGBA1C 5.4 11/17/2017   MPG 108 11/17/2017   MPG 105.41 06/12/2017   Lab Results  Component Value Date   PROLACTIN 14.2 11/17/2017   PROLACTIN 6.3 09/09/2016   Lab Results  Component Value Date   CHOL 152 11/17/2017   TRIG 87 11/17/2017   HDL 38 (L) 11/17/2017   CHOLHDL 4.0 11/17/2017   VLDL 17 11/17/2017   LDLCALC 97 11/17/2017   LDLCALC 91 06/12/2017    Physical Findings: AIMS: Facial and Oral Movements Muscles of Facial Expression: None, normal Lips and Perioral Area: None, normal Jaw: None, normal Tongue: None, normal,Extremity Movements Upper (arms, wrists, hands, fingers): None, normal Lower (legs, knees, ankles, toes): None, normal, Trunk Movements Neck, shoulders, hips: None, normal, Overall Severity Severity of abnormal movements (highest score from questions above): None, normal Incapacitation due to abnormal movements: None, normal Patient's awareness of abnormal movements (rate only patient's report): No Awareness, Dental Status Current problems with teeth and/or dentures?: No Does patient usually wear dentures?: No  CIWA:  CIWA-Ar Total: 2 COWS:  COWS Total Score: 2  Musculoskeletal: Strength & Muscle Tone: within normal limits Gait & Station: normal Patient leans: N/A  Psychiatric Specialty Exam: Physical Exam  Nursing note and vitals reviewed.   Review of Systems  Constitutional: Negative for chills and fever.  Respiratory: Negative for cough and shortness  of breath.   Cardiovascular: Negative for chest pain.  Gastrointestinal: Negative for abdominal pain, heartburn, nausea and vomiting.  Psychiatric/Behavioral: Positive for depression. Negative for hallucinations and suicidal ideas. The patient is  not nervous/anxious and does not have insomnia.     Blood pressure 104/73, pulse 96, temperature 98.2 F (36.8 C), temperature source Oral, resp. rate 20, height 5\' 8"  (1.727 m), weight 80.7 kg.Body mass index is 27.06 kg/m.  General Appearance: Casual and Fairly Groomed  Eye Contact:  Good  Speech:  Clear and Coherent and Normal Rate  Volume:  Normal  Mood:  Anxious and Depressed  Affect:  Congruent, Constricted and Depressed  Thought Process:  Coherent and Goal Directed  Orientation:  Full (Time, Place, and Person)  Thought Content:  Logical  Suicidal Thoughts:  No  Homicidal Thoughts:  No  Memory:  Immediate;   Fair Recent;   Fair Remote;   Fair  Judgement:  Fair  Insight:  Fair  Psychomotor Activity:  Normal  Concentration:  Concentration: Fair  Recall:  [employer] of Knowledge:  Fair  Language:  Fair  Akathisia:  No  Handed:    AIMS (if indicated):     Assets:  Resilience  ADL's:  Intact  Cognition:  WNL  Sleep:  Number of Hours: 6   Treatment Plan Summary: Daily contact with patient to assess and evaluate symptoms and progress in treatment and Medication management   -Continue inpatient hospitalization  -MDD recurrent, severe, without psychotic features -Continue lexapro 20mg  po qDay -Continue abilify 5mg  po qDay  -Hormone treatment for gender transition -Continue estradiol 2mg  po TID -Continue finasteride 2.5mg  po qDay -Continue spironolactone 50mg  po qDay  -anxiety  -Continue vistaril 25mg  po q6h prn anxiety  -Hepatitis C -Continue Harvoni 90-400mg  take 1 tablet po qDay  -nightmares associated with previous  trauma -Continue prazosin 1mg  po qhs  -Encourage participation in groups and therapeutic milieu  -disposition planning will be ongoing  Micheal Likens, MD 03/17/2018, 11:35 AM

## 2018-03-17 NOTE — Progress Notes (Signed)
Patient ID: Randy [name], adult   DOB: Oct 17, 1970, 47 y.o.   MRN: [id number]  PER STATE REGULATIONS 482.30  THIS CHART WAS REVIEWED FOR MEDICAL NECESSITY WITH RESPECT TO THE PATIENT'S ADMISSION/DURATION OF STAY.  NEXT REVIEW DATE: 03/21/2018  Loura Halt, RN, BSN CASE MANAGER

## 2018-03-17 NOTE — Progress Notes (Signed)
Adult Psychoeducational Group Note  Date:  03/17/2018 Time:  9:59 PM  Group Topic/Focus:  Wrap-Up Group:   The focus of this group is to help patients review their daily goal of treatment and discuss progress on daily workbooks.  Participation Level:  Active  Participation Quality:  Appropriate  Affect:  Appropriate  Cognitive:  Alert  Insight: Appropriate  Engagement in Group:  Engaged  Modes of Intervention:  Discussion  Additional Comments:  Pt stated that today has been an alright day. Pt plans on being discharged on Wednesday. Pt stated that her goal is to get through the day without craving a cigarette.   Kaleen Odea R 03/17/2018, 9:59 PM

## 2018-03-18 NOTE — [facility] Group Notes (Signed)
[facility] Group Notes:  (Nursing/MHT/Case Management/Adjunct)  Date:  03/18/2018  Time:  3:00 PM  Type of Therapy:  Nurse Education  Participation Level:  Active  Participation Quality:  Appropriate  Affect:  Appropriate  Cognitive:  Alert and Appropriate  Insight:  Appropriate and Improving  Engagement in Group:  Engaged  Modes of Intervention:  Activity and Discussion  Summary of Progress/Problems: Patient appropriately participated in group.  Randy [name] 03/18/2018, 3:00 PM 

## 2018-03-18 NOTE — Progress Notes (Signed)
Recreation Therapy Notes  Date: 11.8.19 Time: 0930 Location: [address] Dayroom  Group Topic: Stress Management  Goal Area(s) Addresses:  Patient will verbalize importance of using healthy stress management.  Patient will identify positive emotions associated with healthy stress management.   Intervention: Stress Management  Activity :  Meditation.  LRT introduced the stress management technique of meditation.  LRT played [name] meditation that allowed patients to embody the stillness of [name].  Patients were to listen and follow along as the meditation played.  Education:  Stress Management, Discharge Planning.   Education Outcome: Acknowledges edcuation/In group clarification offered/Needs additional education  Clinical Observations/Feedback: Pt did not attend group.     Randy [name], LRT/CTRS         Randy [name] 03/18/2018 11:20 AM 

## 2018-03-18 NOTE — Progress Notes (Signed)
D: Patient observed in dayroom this evening though keeps to self. Patient cautious on approach. Presents with flat affect, anxious and depressed mood. Forwards minimal information. Was unable to identify goal for today. Denies pain, physical complaints.    A: Medicated per orders, no prns requested or needed. Medication education provided. Level III obs in place for safety. Emotional support offered. Patient encouraged to complete Suicide Safety Plan before discharge. Encouraged to attend and participate in unit programming.   R: Patient verbalizes understanding of POC. Patient denies SI/HI/AVH and remains safe on level III obs. Will continue to monitor throughout the night.

## 2018-03-18 NOTE — Plan of Care (Signed)
  Problem: Education: Goal: Emotional status will improve Outcome: Progressing Goal: Mental status will improve Outcome: Progressing   D: Pt alert and oriented on the unit. Pt engaging with RN staff and other pts. Pt denies SI/HI, A/VH. Pt is pleasant and cooperative. A: Education, support and encouragement provided, q15 minute safety checks remain in effect. Medications administered per MD orders. R: No reactions/side effects to medicine noted. Pt denies any concerns at this time, and verbally contracts for safety. Pt ambulating on the unit with no issues. Pt remains safe on and off the unit.

## 2018-03-18 NOTE — Tx Team (Signed)
Interdisciplinary Treatment and Diagnostic Plan Update  03/18/2018 Time of Session: 0830AM Randy [name] MRN: [id number]  Principal Diagnosis: Severe recurrent major depression without psychotic features [facility])  Secondary Diagnoses: Principal Problem:   Severe recurrent major depression without psychotic features (HCC)   Current Medications:  Current Facility-Administered Medications  Medication Dose Route Frequency Provider Last Rate Last Dose  . acetaminophen (TYLENOL) tablet 650 mg  650 mg Oral Q6H PRN Nira Conn A, NP      . alum & mag hydroxide-simeth (MAALOX/MYLANTA) 200-200-20 MG/5ML suspension 30 mL  30 mL Oral Q4H PRN Nira Conn A, NP      . ARIPiprazole (ABILIFY) tablet 5 mg  5 mg Oral Daily Micheal Likens, MD   5 mg at 03/18/18 0815  . escitalopram (LEXAPRO) tablet 20 mg  20 mg Oral Daily Micheal Likens, MD   20 mg at 03/18/18 0814  . estradiol (ESTRACE) tablet 2 mg  2 mg Oral TID Nira Conn A, NP   2 mg at 03/18/18 0814  . finasteride (PROSCAR) tablet 2.5 mg  2.5 mg Oral Daily Nira Conn A, NP   2.5 mg at 03/18/18 0814  . Ledipasvir-Sofosbuvir 90-400 MG TABS 1 tablet  1 tablet Oral Daily Nira Conn A, NP   1 tablet at 03/18/18 0813  . magnesium hydroxide (MILK OF MAGNESIA) suspension 30 mL  30 mL Oral Daily PRN Nira Conn A, NP      . mirtazapine (REMERON) tablet 7.5 mg  7.5 mg Oral QHS Nira Conn A, NP   7.5 mg at 03/17/18 2102  . nicotine (NICODERM CQ - dosed in mg/24 hours) patch 21 mg  21 mg Transdermal Daily Micheal Likens, MD   21 mg at 03/18/18 0813  . nicotine polacrilex (NICORETTE) gum 2 mg  2 mg Oral PRN Cobos, Rockey Situ, MD   2 mg at 03/17/18 1657  . prazosin (MINIPRESS) capsule 1 mg  1 mg Oral QHS Nira Conn A, NP   1 mg at 03/17/18 2102  . spironolactone (ALDACTONE) tablet 50 mg  50 mg Oral Daily Nira Conn A, NP   50 mg at 03/18/18 0815   PTA Medications: Medications Prior to Admission  Medication Sig Dispense  Refill Last Dose  . ARIPiprazole (ABILIFY) 2 MG tablet Take 1 tablet (2 mg total) by mouth daily. 30 tablet 0 Past Week at Unknown time  . escitalopram (LEXAPRO) 10 MG tablet Take 3 tablets (30 mg total) by mouth daily. 30 tablet 0 Past Week at Unknown time  . estradiol (ESTRACE) 2 MG tablet Take 1 tablet (2 mg total) by mouth 3 (three) times daily. For hormonal supplementation 90 tablet 0 Past Month at Unknown time  . finasteride (PROSCAR) 5 MG tablet Take 0.5 tablets (2.5 mg total) by mouth daily. For hormonal treatment for male transgender process: Take 1/2 tablet 15 tablet 0 Past Month at Unknown time  . hydrOXYzine (ATARAX/VISTARIL) 25 MG tablet Take 1 tablet (25 mg total) by mouth 3 (three) times daily as needed for anxiety. (Patient not taking: Reported on 03/12/2018) 30 tablet 0 Not Taking at Unknown time  . Ledipasvir-Sofosbuvir (HARVONI) 90-400 MG TABS Take 1 tablet by mouth daily. 28 tablet 2 03/11/2018 at 8am  . mirtazapine (REMERON) 7.5 MG tablet Take 1 tablet (7.5 mg total) by mouth at bedtime. 30 tablet 0 Past Week at Unknown time  . nicotine (NICODERM CQ - DOSED IN MG/24 HOURS) 21 mg/24hr patch Place 1 patch (21 mg total) onto the skin  daily. (May purchase from over the counter): For smoking cessation (Patient not taking: Reported on 06/21/2017) 28 patch 0 Not Taking at Unknown time  . prazosin (MINIPRESS) 1 MG capsule Take 1 capsule (1 mg total) by mouth at bedtime. 30 capsule 0 Past Week at Unknown time  . spironolactone (ALDACTONE) 50 MG tablet Take 1 tablet (50 mg total) by mouth daily. Part treatment for transgender process. 30 tablet 0 Past Month at Unknown time    Patient Stressors: Financial difficulties Medication change or noncompliance Substance abuse  Patient Strengths: Active sense of humor Average or above average intelligence Capable of independent living Special hobby/interest  Treatment Modalities: Medication Management, Group therapy, Case management,  1 to 1  session with clinician, Psychoeducation, Recreational therapy.   Physician Treatment Plan for Primary Diagnosis: Severe recurrent major depression without psychotic features (HCC) Long Term Goal(s): Improvement in symptoms so as ready for discharge Improvement in symptoms so as ready for discharge   Short Term Goals: Ability to identify changes in lifestyle to reduce recurrence of condition will improve Ability to verbalize feelings will improve Ability to disclose and discuss suicidal ideas Ability to demonstrate self-control will improve Ability to identify and develop effective coping behaviors will improve Ability to maintain clinical measurements within normal limits will improve Compliance with prescribed medications will improve Ability to identify triggers associated with substance abuse/mental health issues will improve  Medication Management: Evaluate patient's response, side effects, and tolerance of medication regimen.  Therapeutic Interventions: 1 to 1 sessions, Unit Group sessions and Medication administration.  Evaluation of Outcomes: Progressing  Physician Treatment Plan for Secondary Diagnosis: Principal Problem:   Severe recurrent major depression without psychotic features (HCC)  Long Term Goal(s): Improvement in symptoms so as ready for discharge Improvement in symptoms so as ready for discharge   Short Term Goals: Ability to identify changes in lifestyle to reduce recurrence of condition will improve Ability to verbalize feelings will improve Ability to disclose and discuss suicidal ideas Ability to demonstrate self-control will improve Ability to identify and develop effective coping behaviors will improve Ability to maintain clinical measurements within normal limits will improve Compliance with prescribed medications will improve Ability to identify triggers associated with substance abuse/mental health issues will improve     Medication Management:  Evaluate patient's response, side effects, and tolerance of medication regimen.  Therapeutic Interventions: 1 to 1 sessions, Unit Group sessions and Medication administration.  Evaluation of Outcomes: Progressing   RN Treatment Plan for Primary Diagnosis: Severe recurrent major depression without psychotic features (HCC) Long Term Goal(s): Knowledge of disease and therapeutic regimen to maintain health will improve  Short Term Goals: Ability to remain free from injury will improve, Ability to verbalize frustration and anger appropriately will improve, Ability to disclose and discuss suicidal ideas and Ability to identify and develop effective coping behaviors will improve  Medication Management: RN will administer medications as ordered by provider, will assess and evaluate patient's response and provide education to patient for prescribed medication. RN will report any adverse and/or side effects to prescribing provider.  Therapeutic Interventions: 1 on 1 counseling sessions, Psychoeducation, Medication administration, Evaluate responses to treatment, Monitor vital signs and CBGs as ordered, Perform/monitor CIWA, COWS, AIMS and Fall Risk screenings as ordered, Perform wound care treatments as ordered.  Evaluation of Outcomes: Progressing   LCSW Treatment Plan for Primary Diagnosis: Severe recurrent major depression without psychotic features (HCC) Long Term Goal(s): Safe transition to appropriate next level of care at discharge, Engage patient in therapeutic  group addressing interpersonal concerns.  Short Term Goals: Engage patient in aftercare planning with referrals and resources, Facilitate patient progression through stages of change regarding substance use diagnoses and concerns and Identify triggers associated with mental health/substance abuse issues  Therapeutic Interventions: Assess for all discharge needs, 1 to 1 time with Social worker, Explore available resources and support  systems, Assess for adequacy in community support network, Educate family and significant other(s) on suicide prevention, Complete Psychosocial Assessment, Interpersonal group therapy.  Evaluation of Outcomes: Progressing   Progress in Treatment: Attending groups: Yes. Participating in groups: Yes. Taking medication as prescribed: Yes. Toleration medication: Yes. Family/Significant other contact made: SPE completed with pt; pt declined to consent to collateral contact.  Patient understands diagnosis: Yes. Discussing patient identified problems/goals with staff: Yes. Medical problems stabilized or resolved: Yes. Denies suicidal/homicidal ideation: No. Issues/concerns per patient self-inventory: Yes. Other: n/a  New problem(s) identified: No, Describe:  n/a  New Short Term/Long Term Goal(s): detox, medication management for mood stabilization; elimination of SI thoughts; development of comprehensive mental wellness/sobriety plan.   Patient Goals:  "To get into rehab from here. I need more help."   Discharge Plan or Barriers: Pt has [facility] screening for admission on Wednesday, 11/13 at 7:45AM. [facility] pamphlet, Mobile Crisis information, and AA/NA information provided to patient for additional community support and resources.   Reason for Continuation of Hospitalization: Anxiety Depression Medication stabilization  Estimated Length of Stay: Wednesday, 03/23/18 at 7:00AM. Pt is discharging directly to [facility] via taxi.   Attendees: Patient: 03/18/2018 9:37 AM  Physician: Dr. Altamese [place] MD 03/18/2018 9:37 AM  Nursing: Arlyss Repress RN; Ethelene Browns RN 03/18/2018 9:37 AM  RN Care Manager:x 03/18/2018 9:37 AM  Social Worker: Corrie Mckusick LCSW 03/18/2018 9:37 AM  Recreational Therapist: x 03/18/2018 9:37 AM  Other: Armandina Stammer NP 03/18/2018 9:37 AM  Other:  03/18/2018 9:37 AM  Other: 03/18/2018 9:37 AM    Scribe for Treatment Team: Rona Ravens, LCSW 03/18/2018 9:37 AM

## 2018-03-18 NOTE — Progress Notes (Signed)
Patient did attend the evening speaker AA meeting.  

## 2018-03-18 NOTE — Progress Notes (Signed)
D.  Pt pleasant on approach, no complaints voiced.  Pt was positive for evening AA group, observed minimally but appropriately engaged with peers on the unit.  Pt denies SI/HI/AVH at this time.  A.  Support and encouragement offered, medication given as ordered  R. Pt remains safe on the unit, will continue to monitor.

## 2018-03-18 NOTE — Progress Notes (Signed)
[facility] MD Progress Note  03/18/2018 2:00 PM Randy [name]  MRN:  [id number] Subjective:   History as per psychiatric intake suicide risk assessment: 47 year old transgender male,known to our service from prior admissions, most recently in July 2019 for depression, suicidal ideations. At the time was diagnosed with MDD and Cocaine Use Disorder. Was discharged on Abilify, Lexapro, Remeron, Minipress. Patient presented for worsening depression, suicidal ideations, with thoughts of overdosing. Describes anhedonia, decreased appetite, decreased energy level, poor sleep. Denies hallucinations. States that a few weeks ago she ran into a friend of her deceased BF [name] ( who passed away in 08-Jun-2017) and states " everything seemed to go downhill from there after that". She reports recently relapsing on cocaine and heroin . States she had used " constantly for about 10 days , but none before then". Of note, states she has not used heroin over the last 5 days. Admission UDS positive for cocaine , but not for opiates. Admission BAL negative. Denies any alcohol or BZD abuse . States she had stopped her psychiatric medications about a month ago.  As per evaluation today: Today upon evaluation, pt shares, "I'm doing okay." She denies any specific concerns. She is sleeping well. Her appetite is good. She denies other physical complaints. She denies SI/HI/AH/VH. She is tolerating her medications well, and she denies any side effects. She remains in agreement to be discharged directly to [facility] on 03/23/18, and we will plan to continue her current regimen without changes until that time. Pt remains high risk for suicide if she is to discharge prior to going directly to substance use treatment due to poor social support, homelessness, and multiple previous suicide attempts.  Principal Problem: Severe recurrent major depression without psychotic features (HCC) Diagnosis:   Patient Active Problem List   Diagnosis  Date Noted  . Severe recurrent major depression without psychotic features (HCC) [F33.2] 11/16/2017  . Liver fibrosis [K74.0] 06/22/2017  . MDD (major depressive disorder), single episode, severe , no psychosis (HCC) [F32.2] 06/11/2017  . Cough [R05] 03/14/2015  . Major depressive disorder, recurrent, severe without psychotic features (HCC) [F33.2]   . Opioid dependence with opioid-induced mood disorder (HCC) [F11.24]   . Cocaine dependence with cocaine-induced mood disorder (HCC) [F14.24]   . MDD (major depressive disorder) [F32.9] 09/17/2014  . MDD (major depressive disorder), recurrent severe, without psychosis (HCC) [F33.2] 07/05/2014  . Major depression, recurrent (HCC) [F33.9] 04/10/2012  . Cocaine dependence (HCC) [F14.20] 04/10/2012  . Polysubstance abuse including IVDA (heroin), cocaine, marijuana [F19.10] 01/10/2012  . Elevated LFTs [R94.5] 01/10/2012  . Chronic hepatitis C without hepatic coma (HCC) [B18.2] 01/10/2012  . 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 Tobacco abuse [Z72.0] 01/10/2012  . Anxiety disorder [F41.9] 08/14/2011  . Recurrent major depression-severe (HCC) [F33.2] 08/13/2011   Total Time spent with patient: 30 minutes  Past Psychiatric History: See H&P  Past Medical History:  Past Medical History:  Diagnosis Date  . Cellulitis  Of  Left Forearm 01/10/2012   From IVDA  . Depression   . Hepatitis C   . 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 I&D EXTREMITY  01/13/2012   Procedure: IRRIGATION AND DEBRIDEMENT EXTREMITY;  Surgeon: Kennieth Rad, MD;  Location: [facility];  Service: Orthopedics;  Laterality: Left;   Family History:  Family History  Problem Relation Age of Onset  . Idiopathic pulmonary fibrosis Father    Family Psychiatric  History: see H&P Social History:  Social History   Substance and Sexual Activity  Alcohol Use Yes   Comment: occ      Social History   Substance and Sexual Activity  Drug Use Yes  . Types: Heroin, "Crack" cocaine, Marijuana   Comment: heroin last used 07/14/14, crack/ had some a few days ago    Social History   Socioeconomic History  . Marital status: Single    Spouse name: Not on file  . Number of children: Not on file  . Years of education: Not on file  . Highest education level: Not on file  Occupational History  . 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 Financial resource strain: Very hard  . Food insecurity:    Worry: Often true    Inability: Often true  . Transportation needs:    Medical: Yes    Non-medical: Yes  Tobacco Use  . Smoking status: Current Every Day Smoker    Packs/day: 0.50    Years: 20.00    Pack years: 10.00    Types: Cigarettes  . Smokeless tobacco: Never Used  . Tobacco comment: trying to quit-using e-cig some  Substance and Sexual Activity  . Alcohol use: Yes    Comment: occ  . Drug use: Yes    Types: Heroin, "Crack" cocaine, Marijuana    Comment: heroin last used 07/14/14, crack/ had some a few days ago  . Sexual activity: Yes    Partners: Male    Birth control/protection: Condom    Comment: Has been HIV tested in past and has been negative.  Lifestyle  . Physical activity:    Days per week: Patient refused    Minutes per session: Patient refused  . Stress: Rather much  Relationships  . Social connections:    Talks on phone: Patient refused    Gets together: Patient refused    Attends religious service: Patient refused    Active member of club or organization: Patient refused    Attends meetings of clubs or organizations: Patient refused    Relationship status: Patient refused  Other Topics Concern  . Not on file  Social History Narrative   Transgendered male.  Single.  Lives alone.  Independent.   Additional Social History:                         Sleep: Good  Appetite:  Good  Current  Medications: Current Facility-Administered Medications  Medication Dose Route Frequency Provider Last Rate Last Dose  . acetaminophen (TYLENOL) tablet 650 mg  650 mg Oral Q6H PRN Nira Conn A, NP      . alum & mag hydroxide-simeth (MAALOX/MYLANTA) 200-200-20 MG/5ML suspension 30 mL  30 mL Oral Q4H PRN Nira Conn A, NP      . ARIPiprazole (ABILIFY) tablet 5 mg  5 mg Oral Daily Micheal Likens, MD   5 mg at 03/18/18 0815  . escitalopram (LEXAPRO) tablet 20 mg  20 mg Oral Daily Micheal Likens, MD   20 mg at 03/18/18 0814  . estradiol (ESTRACE) tablet 2 mg  2 mg Oral TID Nira Conn A, NP   2 mg at 03/18/18 1317  . finasteride (PROSCAR) tablet 2.5 mg  2.5 mg Oral Daily Nira Conn A, NP   2.5 mg at 03/18/18 0814  . Ledipasvir-Sofosbuvir 90-400 MG TABS 1 tablet  1 tablet Oral Daily Jackelyn Poling, NP   1 tablet  at 03/18/18 0813  . magnesium hydroxide (MILK OF MAGNESIA) suspension 30 mL  30 mL Oral Daily PRN Nira Conn A, NP      . mirtazapine (REMERON) tablet 7.5 mg  7.5 mg Oral QHS Nira Conn A, NP   7.5 mg at 03/17/18 2102  . nicotine (NICODERM CQ - dosed in mg/24 hours) patch 21 mg  21 mg Transdermal Daily Micheal Likens, MD   21 mg at 03/18/18 0813  . nicotine polacrilex (NICORETTE) gum 2 mg  2 mg Oral PRN Cobos, Rockey Situ, MD   2 mg at 03/17/18 1657  . prazosin (MINIPRESS) capsule 1 mg  1 mg Oral QHS Nira Conn A, NP   1 mg at 03/17/18 2102  . spironolactone (ALDACTONE) tablet 50 mg  50 mg Oral Daily Nira Conn A, NP   50 mg at 03/18/18 0815    Lab Results: No results found for this or any previous visit (from the past 48 hour(s)).  Blood Alcohol level:  Lab Results  Component Value Date   ETH <10 03/12/2018   ETH <10 06/12/2017    Metabolic Disorder Labs: Lab Results  Component Value Date   HGBA1C 5.4 11/17/2017   MPG 108 11/17/2017   MPG 105.41 06/12/2017   Lab Results  Component Value Date   PROLACTIN 14.2 11/17/2017   PROLACTIN 6.3  09/09/2016   Lab Results  Component Value Date   CHOL 152 11/17/2017   TRIG 87 11/17/2017   HDL 38 (L) 11/17/2017   CHOLHDL 4.0 11/17/2017   VLDL 17 11/17/2017   LDLCALC 97 11/17/2017   LDLCALC 91 06/12/2017    Physical Findings: AIMS: Facial and Oral Movements Muscles of Facial Expression: None, normal Lips and Perioral Area: None, normal Jaw: None, normal Tongue: None, normal,Extremity Movements Upper (arms, wrists, hands, fingers): None, normal Lower (legs, knees, ankles, toes): None, normal, Trunk Movements Neck, shoulders, hips: None, normal, Overall Severity Severity of abnormal movements (highest score from questions above): None, normal Incapacitation due to abnormal movements: None, normal Patient's awareness of abnormal movements (rate only patient's report): No Awareness, Dental Status Current problems with teeth and/or dentures?: No Does patient usually wear dentures?: No  CIWA:  CIWA-Ar Total: 2 COWS:  COWS Total Score: 2  Musculoskeletal: Strength & Muscle Tone: within normal limits Gait & Station: normal Patient leans: N/A  Psychiatric Specialty Exam: Physical Exam  Nursing note and vitals reviewed.   Review of Systems  Constitutional: Negative for chills and fever.  Respiratory: Negative for cough and shortness of breath.   Cardiovascular: Negative for chest pain.  Gastrointestinal: Negative for abdominal pain, heartburn, nausea and vomiting.  Psychiatric/Behavioral: Negative for depression, hallucinations and suicidal ideas. The patient is not nervous/anxious and does not have insomnia.     Blood pressure 114/74, pulse 85, temperature 98 F (36.7 C), temperature source Oral, resp. rate 16, height 5\' 8"  (1.727 m), weight 80.7 kg.Body mass index is 27.06 kg/m.  General Appearance: Casual and Fairly Groomed  Eye Contact:  Good  Speech:  Clear and Coherent and Normal Rate  Volume:  Normal  Mood:  Euthymic  Affect:  Appropriate and Congruent  Thought  Process:  Coherent and Goal Directed  Orientation:  Full (Time, Place, and Person)  Thought Content:  Logical  Suicidal Thoughts:  No  Homicidal Thoughts:  No  Memory:  Immediate;   Fair Recent;   Fair Remote;   Fair  Judgement:  Fair  Insight:  Fair  Psychomotor Activity:  Normal  Concentration:  Concentration: Fair  Recall:  Poor  Fund of Knowledge:  Fair  Language:  Fair  Akathisia:  No  Handed:    AIMS (if indicated):     Assets:  Resilience Social Support  ADL's:  Intact  Cognition:  WNL  Sleep:  Number of Hours: 4.75   Treatment Plan Summary: Daily contact with patient to assess and evaluate symptoms and progress in treatment and Medication management   -Continue inpatient hospitalization  -MDD recurrent, severe, without psychotic features -Continuelexapro 20mg  po qDay -Continueabilify 5mg  po qDay  -Hormone treatment for gender transition -Continue estradiol 2mg  po TID -Continue finasteride 2.5mg  po qDay -Continue spironolactone 50mg  po qDay  -anxiety  -Continue vistaril 25mg  po q6h prn anxiety  -Hepatitis C -Continue Harvoni 90-400mg  take 1 tablet po qDay  -nightmares associated with previous trauma -Continue prazosin 1mg  po qhs  -Encourage participation in groups and therapeutic milieu  -disposition planning will be ongoing   Micheal Likens, MD 03/18/2018, 2:00 PM

## 2018-03-18 NOTE — Plan of Care (Signed)
  Problem: Education: Goal: Verbalization of understanding the information provided will improve Outcome: Progressing   Problem: Safety: Goal: Periods of time without injury will increase Outcome: Progressing   

## 2018-03-19 NOTE — Progress Notes (Signed)
D.   Pt pleasant on approach, no complaints voiced at this time.  Pt was positive for evening wrap up group, observed engaged in appropriate interaction within the milieu.  Pt denies SI/HI/AVH at this time.  A.  Support and encouragement offered, medication given as ordered  R.  Pt remains safe on the unit, will continue to monitor.

## 2018-03-19 NOTE — Progress Notes (Signed)
Adult Psychoeducational Group Note  Date:  03/19/2018 Time:  6:34 PM  Group Topic/Focus:  Goals Group:   The focus of this group is to help patients establish daily goals to achieve during treatment and discuss how the patient can incorporate goal setting into their daily lives to aide in recovery.  Participation Level:  Active  Participation Quality:  Appropriate  Affect:  Appropriate  Cognitive:  Alert  Insight: Appropriate  Engagement in Group:  Engaged  Modes of Intervention:  Discussion  Additional Comments:  Pt attended group and participated in group discussion.  Monta Maiorana R Desiree Daise 03/19/2018, 6:34 PM

## 2018-03-19 NOTE — Progress Notes (Signed)
Pt declined referrals to [facility] and [facility]. She plans to attend [facility] on Wed, 03/23/18 at 7:45AM for possible admission.   Jem Castro S. Alan Ripper, MSW, LCSW Clinical Social Worker 03/19/2018 2:29 PM

## 2018-03-19 NOTE — Progress Notes (Signed)
D. Pt presents with a depressed affect congruent with mood- brightens a little upon interactions. Pt remains quiet in the milieu, attends groups- calm and cooperative . Pt currently denies SI/HI and AV hallucinations A. Labs and vitals monitored. Pt compliant with medications. Pt supported emotionally and encouraged to express concerns and ask questions.   R. Pt remains safe with 15 minute checks. Will continue POC.

## 2018-03-19 NOTE — [facility] Group Notes (Signed)
LCSW Group Therapy Note  03/19/2018    9:15-10:00am   Type of Therapy and Topic:  Group Therapy: Anger and Coping Skills  Participation Level:  Active   Description of Group:   In this group, patients learned how to recognize the physical, cognitive, emotional, and behavioral responses they have to anger-provoking situations.  They identified how they usually or often react when angered, and learned how healthy and unhealthy coping skills work initially, but the unhealthy ones stop working.   They analyzed how their frequently-chosen coping skill is possibly beneficial and how it is possibly unhelpful.  The group discussed a variety of healthier coping skills that could help in resolving the actual issues, as well as how to go about planning for the the possibility of future similar situations.  Therapeutic Goals: 1. Patients will identify one thing that makes them angry and how they feel emotionally and physically, what their thoughts are or tend to be in those situations, and what healthy or unhealthy coping mechanism they typically use 2. Patients will identify how their coping technique works for them, as well as how it works against them. 3. Patients will explore possible new behaviors to use in future anger situations. 4. Patients will learn that anger itself is normal and cannot be eliminated, and that healthier coping skills can assist with resolving conflict rather than worsening situations.  Summary of Patient Progress:  The patient shared that she was "irritated" a few hours ago when she felt useless, and said that she often will pour alcohol and drugs on any feelings she has in hopes of chasing those feelings away.  She acknowledged that this does not work.  She did not participate any further in the discussion about anger, but later did enter into the discussion concerning how she had been raised to not show emotions and how harmful that has been in her life.  Therapeutic Modalities:    Cognitive Behavioral Therapy Motivation Interviewing  Lynnell Chad  .

## 2018-03-19 NOTE — Progress Notes (Signed)
[facility] MD Progress Note  03/19/2018 1:16 PM Randy [name]  MRN:  [id number] Subjective:  "I think I slept well and I'm doing ok".  HPI :47 year old transgender male,known to our service from prior admissions, most recently in July 2019 for depression, suicidal ideations. At the time was diagnosed with MDD and Cocaine Use Disorder. Was discharged on Abilify, Lexapro, Remeron, Minipress.  Today upon evaluation, pt reports she is doing "ok". She denies any specific concerns. She is sleeping well. Her appetite is good. She denies other physical complaints. She denies SI/HI/AH/VH. She is tolerating her medications well, and she denies any side effects. She remains in agreement to be discharged directly to [facility] on 03/23/18,she is looking forward to going into treatment and we will plan to continue her current regimen without changes until that time. She reports her cravings " are not bad today actually". Pt remains high risk for suicide if she is to discharge prior to going directly to substance use treatment due to poor social support, homelessness, and multiple previous suicide attempts.  Principal Problem: Severe recurrent major depression without psychotic features (HCC) Diagnosis:   Patient Active Problem List   Diagnosis Date Noted  . Severe recurrent major depression without psychotic features (HCC) [F33.2] 11/16/2017  . Liver fibrosis [K74.0] 06/22/2017  . MDD (major depressive disorder), single episode, severe , no psychosis (HCC) [F32.2] 06/11/2017  . Cough [R05] 03/14/2015  . Major depressive disorder, recurrent, severe without psychotic features (HCC) [F33.2]   . Opioid dependence with opioid-induced mood disorder (HCC) [F11.24]   . Cocaine dependence with cocaine-induced mood disorder (HCC) [F14.24]   . MDD (major depressive disorder) [F32.9] 09/17/2014  . MDD (major depressive disorder), recurrent severe, without psychosis (HCC) [F33.2] 07/05/2014  . Major depression, recurrent (HCC)  [F33.9] 04/10/2012  . Cocaine dependence (HCC) [F14.20] 04/10/2012  . Polysubstance abuse including IVDA (heroin), cocaine, marijuana [F19.10] 01/10/2012  . Elevated LFTs [R94.5] 01/10/2012  . Chronic hepatitis C without hepatic coma (HCC) [B18.2] 01/10/2012  . 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 Tobacco abuse [Z72.0] 01/10/2012  . Anxiety disorder [F41.9] 08/14/2011  . Recurrent major depression-severe (HCC) [F33.2] 08/13/2011   Total Time spent with patient: 20 minutes  Past Psychiatric History: See H&P  Past Medical History:  Past Medical History:  Diagnosis Date  . Cellulitis  Of  Left Forearm 01/10/2012   From IVDA  . Depression   . Hepatitis C   . 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 I&D EXTREMITY  01/13/2012   Procedure: IRRIGATION AND DEBRIDEMENT EXTREMITY;  Surgeon: Kennieth Rad, MD;  Location: [facility];  Service: Orthopedics;  Laterality: Left;   Family History:  Family History  Problem Relation Age of Onset  . Idiopathic pulmonary fibrosis Father    Family Psychiatric  History: see H&P Social History:  Social History   Substance and Sexual Activity  Alcohol Use Yes   Comment: occ     Social History   Substance and Sexual Activity  Drug Use Yes  . Types: Heroin, "Crack" cocaine, Marijuana   Comment: heroin last used 07/14/14, crack/ had some a few days ago    Social History   Socioeconomic History  . Marital status: Single    Spouse name: Not on file  . Number of children: Not on file  . Years of education: Not on file  . Highest education level: Not on file  Occupational History  .  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 Financial resource strain: Very hard  . Food insecurity:    Worry: Often true    Inability: Often true  . Transportation needs:    Medical: Yes    Non-medical: Yes  Tobacco Use  . Smoking status:  Current Every Day Smoker    Packs/day: 0.50    Years: 20.00    Pack years: 10.00    Types: Cigarettes  . Smokeless tobacco: Never Used  . Tobacco comment: trying to quit-using e-cig some  Substance and Sexual Activity  . Alcohol use: Yes    Comment: occ  . Drug use: Yes    Types: Heroin, "Crack" cocaine, Marijuana    Comment: heroin last used 07/14/14, crack/ had some a few days ago  . Sexual activity: Yes    Partners: Male    Birth control/protection: Condom    Comment: Has been HIV tested in past and has been negative.  Lifestyle  . Physical activity:    Days per week: Patient refused    Minutes per session: Patient refused  . Stress: Rather much  Relationships  . Social connections:    Talks on phone: Patient refused    Gets together: Patient refused    Attends religious service: Patient refused    Active member of club or organization: Patient refused    Attends meetings of clubs or organizations: Patient refused    Relationship status: Patient refused  Other Topics Concern  . Not on file  Social History Narrative   Transgendered male.  Single.  Lives alone.  Independent.   Additional Social History:     Sleep: Good  Appetite:  Good  Current Medications: Current Facility-Administered Medications  Medication Dose Route Frequency Provider Last Rate Last Dose  . acetaminophen (TYLENOL) tablet 650 mg  650 mg Oral Q6H PRN Nira Conn A, NP      . alum & mag hydroxide-simeth (MAALOX/MYLANTA) 200-200-20 MG/5ML suspension 30 mL  30 mL Oral Q4H PRN Nira Conn A, NP      . ARIPiprazole (ABILIFY) tablet 5 mg  5 mg Oral Daily Micheal Likens, MD   5 mg at 03/19/18 0817  . escitalopram (LEXAPRO) tablet 20 mg  20 mg Oral Daily Micheal Likens, MD   20 mg at 03/19/18 0817  . estradiol (ESTRACE) tablet 2 mg  2 mg Oral TID Nira Conn A, NP   2 mg at 03/19/18 1214  . finasteride (PROSCAR) tablet 2.5 mg  2.5 mg Oral Daily Nira Conn A, NP   2.5 mg at 03/19/18  0817  . Ledipasvir-Sofosbuvir 90-400 MG TABS 1 tablet  1 tablet Oral Daily Nira Conn A, NP   1 tablet at 03/19/18 0820  . magnesium hydroxide (MILK OF MAGNESIA) suspension 30 mL  30 mL Oral Daily PRN Nira Conn A, NP      . mirtazapine (REMERON) tablet 7.5 mg  7.5 mg Oral QHS Nira Conn A, NP   7.5 mg at 03/18/18 2102  . nicotine (NICODERM CQ - dosed in mg/24 hours) patch 21 mg  21 mg Transdermal Daily Micheal Likens, MD   21 mg at 03/18/18 0813  . nicotine polacrilex (NICORETTE) gum 2 mg  2 mg Oral PRN Cobos, Rockey Situ, MD   2 mg at 03/17/18 1657  . prazosin (MINIPRESS) capsule 1 mg  1 mg Oral QHS Nira Conn A, NP   1 mg at 03/18/18 2102  . spironolactone (ALDACTONE)  tablet 50 mg  50 mg Oral Daily Nira Conn A, NP   50 mg at 03/19/18 0981    Lab Results: No results found for this or any previous visit (from the past 48 hour(s)).  Blood Alcohol level:  Lab Results  Component Value Date   ETH <10 03/12/2018   ETH <10 06/12/2017    Metabolic Disorder Labs: Lab Results  Component Value Date   HGBA1C 5.4 11/17/2017   MPG 108 11/17/2017   MPG 105.41 06/12/2017   Lab Results  Component Value Date   PROLACTIN 14.2 11/17/2017   PROLACTIN 6.3 09/09/2016   Lab Results  Component Value Date   CHOL 152 11/17/2017   TRIG 87 11/17/2017   HDL 38 (L) 11/17/2017   CHOLHDL 4.0 11/17/2017   VLDL 17 11/17/2017   LDLCALC 97 11/17/2017   LDLCALC 91 06/12/2017    Physical Findings: AIMS: Facial and Oral Movements Muscles of Facial Expression: None, normal Lips and Perioral Area: None, normal Jaw: None, normal Tongue: None, normal,Extremity Movements Upper (arms, wrists, hands, fingers): None, normal Lower (legs, knees, ankles, toes): None, normal, Trunk Movements Neck, shoulders, hips: None, normal, Overall Severity Severity of abnormal movements (highest score from questions above): None, normal Incapacitation due to abnormal movements: None, normal Patient's  awareness of abnormal movements (rate only patient's report): No Awareness, Dental Status Current problems with teeth and/or dentures?: No Does patient usually wear dentures?: No  CIWA:  CIWA-Ar Total: 2 COWS:  COWS Total Score: 2  Musculoskeletal: Strength & Muscle Tone: within normal limits Gait & Station: normal Patient leans: N/A  Psychiatric Specialty Exam: Physical Exam  Nursing note and vitals reviewed. Constitutional: She appears well-developed.  Psychiatric: Her speech is normal. Thought content normal. Her affect is not blunt, not labile and not inappropriate. She exhibits a depressed mood.    Review of Systems  Constitutional: Negative.  Negative for chills and fever.  Respiratory: Negative for cough and shortness of breath.   Cardiovascular: Negative for chest pain.  Gastrointestinal: Negative for abdominal pain, heartburn, nausea and vomiting.  Psychiatric/Behavioral: Positive for depression and substance abuse. Negative for suicidal ideas. The patient is not nervous/anxious.     Blood pressure (!) 128/97, pulse 84, temperature (!) 97.4 F (36.3 C), temperature source Oral, resp. rate 16, height 5\' 8"  (1.727 m), weight 80.7 kg.Body mass index is 27.06 kg/m.  General Appearance: Casual and Fairly Groomed  Eye Contact:  Good  Speech:  Clear and Coherent and Normal Rate  Volume:  Normal  Mood:  Euthymic  Affect:  Appropriate and Congruent  Thought Process:  Coherent and Goal Directed  Orientation:  Full (Time, Place, and Person)  Thought Content:  Logical  Suicidal Thoughts:  No  Homicidal Thoughts:  No  Memory:  Immediate;   Fair Recent;   Fair Remote;   Fair  Judgement:  Fair  Insight:  Fair  Psychomotor Activity:  Normal  Concentration:  Concentration: Fair  Recall:  Poor  Fund of Knowledge:  Fair  Language:  Fair  Akathisia:  No  Handed:    AIMS (if indicated):     Assets:  Resilience Social Support  ADL's:  Intact  Cognition:  WNL  Sleep:  Number  of Hours: 6   Treatment Plan Summary: Daily contact with patient to assess and evaluate symptoms and progress in treatment and Medication management  No new changes to be identigfied or adjusted. Plan below reviewed 03/19/2018, no changes.  -Continue inpatient hospitalization  -MDD recurrent, severe,  without psychotic features -Continuelexapro 20mg  po qDay -Continueabilify 5mg  po qDay  -Hormone treatment for gender transition -Continue estradiol 2mg  po TID -Continue finasteride 2.5mg  po qDay -Continue spironolactone 50mg  po qDay  -anxiety  -Continue vistaril 25mg  po q6h prn anxiety  -Hepatitis C -Continue Harvoni 90-400mg  take 1 tablet po qDay  -nightmares associated with previous trauma -Continue prazosin 1mg  po qhs  -Encourage participation in groups and therapeutic milieu  -disposition planning will be ongoing   Maryagnes Amos, FNP 03/19/2018, 1:16 PM

## 2018-03-19 NOTE — [facility] Group Notes (Signed)
[facility] Group Notes:  (Nursing)  Date:  03/19/2018  Time: 1 15 pm Type of Therapy:  Nurse Education  Participation Level:  Did Not Attend   Shela Nevin 03/19/2018, 4:46 PM

## 2018-03-19 NOTE — Progress Notes (Signed)
Adult Psychoeducational Group Note  Date:  03/19/2018 Time:  9:24 PM  Group Topic/Focus:  Wrap-Up Group:   The focus of this group is to help patients review their daily goal of treatment and discuss progress on daily workbooks.  Participation Level:  Active  Participation Quality:  Appropriate  Affect:  Appropriate  Cognitive:  Appropriate  Insight: Appropriate  Engagement in Group:  Engaged  Modes of Intervention:  Discussion  Additional Comments:  The patient expressed that she rates today a 5.The patient also said that she is preparing for next treatment center.  Octavio Manns 03/19/2018, 9:24 PM

## 2018-03-20 DIAGNOSIS — B1920 Unspecified viral hepatitis C without hepatic coma: Secondary | ICD-10-CM

## 2018-03-20 LAB — HEMOGLOBIN A1C
Hgb A1c MFr Bld: 5 % (ref 4.8–5.6)
Mean Plasma Glucose: 96.8 mg/dL

## 2018-03-20 LAB — LIPID PANEL
CHOL/HDL RATIO: 3.5 ratio
CHOLESTEROL: 201 mg/dL — AB (ref 0–200)
HDL: 57 mg/dL (ref >40)
LDL Cholesterol: 100 mg/dL — ABNORMAL HIGH (ref 0–99)
TRIGLYCERIDES: 219 mg/dL — AB (ref <150)
VLDL: 44 mg/dL — ABNORMAL HIGH (ref 0–40)

## 2018-03-20 NOTE — Progress Notes (Signed)
D>  Pt pleasant on approach, no complaints voiced.  Pt was positive for evening AA group, states feels ready to go to [facility].  Pt observed up in dayroom more, minimal but appropriate interaction with peers on the unit.  Pt denies SI/HI/AVH at this time.  A.  Support and encouragement offered, medication given as ordered  R.  PT remains safe on the unit, will continue to monitor.

## 2018-03-20 NOTE — Progress Notes (Signed)
[facility] MD Progress Note  03/20/2018 12:06 PM Randy [name]  MRN:  [id number] Subjective:  "I'm ready to go to [facility]".  HPI :47 year old transgender male,known to our service from prior admissions, most recently in July 2019 for depression, suicidal ideations. At the time was diagnosed with MDD and Cocaine Use Disorder. Was discharged on Abilify, Lexapro, Remeron, Minipress.  Today upon evaluation, pt reports she is doing "ok".She is tolerating her medications well, and she denies any side effects. States she believes that her mood is improving since medications were restarted on this admission. She denies any specific concerns. She is sleeping well. Her appetite is good. She denies other physical complaints, states she was tired yesterday but has more energy today. She denies SI/HI/AH/VH. She remains in agreement to be discharged directly to [facility] on 03/23/18,she is looking forward to going into treatment and we will plan to continue her current regimen without changes until that time. She reports her cravings " are not bad today actually". Pt remains high risk for suicide if she is to discharge prior to going directly to substance use treatment due to poor social support, homelessness, and multiple previous suicide attempts.  Principal Problem: Severe recurrent major depression without psychotic features (HCC) Diagnosis:   Patient Active Problem List   Diagnosis Date Noted  . Severe recurrent major depression without psychotic features (HCC) [F33.2] 11/16/2017  . Liver fibrosis [K74.0] 06/22/2017  . MDD (major depressive disorder), single episode, severe , no psychosis (HCC) [F32.2] 06/11/2017  . Cough [R05] 03/14/2015  . Major depressive disorder, recurrent, severe without psychotic features (HCC) [F33.2]   . Opioid dependence with opioid-induced mood disorder (HCC) [F11.24]   . Cocaine dependence with cocaine-induced mood disorder (HCC) [F14.24]   . MDD (major depressive disorder) [F32.9]  09/17/2014  . MDD (major depressive disorder), recurrent severe, without psychosis (HCC) [F33.2] 07/05/2014  . Major depression, recurrent (HCC) [F33.9] 04/10/2012  . Cocaine dependence (HCC) [F14.20] 04/10/2012  . Polysubstance abuse including IVDA (heroin), cocaine, marijuana [F19.10] 01/10/2012  . Elevated LFTs [R94.5] 01/10/2012  . Chronic hepatitis C without hepatic coma (HCC) [B18.2] 01/10/2012  . 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 Tobacco abuse [Z72.0] 01/10/2012  . Anxiety disorder [F41.9] 08/14/2011  . Recurrent major depression-severe (HCC) [F33.2] 08/13/2011   Total Time spent with patient: 20 minutes  Past Psychiatric History: See H&P  Past Medical History:  Past Medical History:  Diagnosis Date  . Cellulitis  Of  Left Forearm 01/10/2012   From IVDA  . Depression   . Hepatitis C   . 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 I&D EXTREMITY  01/13/2012   Procedure: IRRIGATION AND DEBRIDEMENT EXTREMITY;  Surgeon: Kennieth Rad, MD;  Location: [facility];  Service: Orthopedics;  Laterality: Left;   Family History:  Family History  Problem Relation Age of Onset  . Idiopathic pulmonary fibrosis Father    Family Psychiatric  History: see H&P Social History:  Social History   Substance and Sexual Activity  Alcohol Use Yes   Comment: occ     Social History   Substance and Sexual Activity  Drug Use Yes  . Types: Heroin, "Crack" cocaine, Marijuana   Comment: heroin last used 07/14/14, crack/ had some a few days ago    Social History   Socioeconomic History  . Marital status: Single    Spouse name: Not on file  . Number of children: Not  on file  . Years of education: Not on file  . Highest education level: Not on file  Occupational History  . 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 Financial resource strain: Very hard  . Food  insecurity:    Worry: Often true    Inability: Often true  . Transportation needs:    Medical: Yes    Non-medical: Yes  Tobacco Use  . Smoking status: Current Every Day Smoker    Packs/day: 0.50    Years: 20.00    Pack years: 10.00    Types: Cigarettes  . Smokeless tobacco: Never Used  . Tobacco comment: trying to quit-using e-cig some  Substance and Sexual Activity  . Alcohol use: Yes    Comment: occ  . Drug use: Yes    Types: Heroin, "Crack" cocaine, Marijuana    Comment: heroin last used 07/14/14, crack/ had some a few days ago  . Sexual activity: Yes    Partners: Male    Birth control/protection: Condom    Comment: Has been HIV tested in past and has been negative.  Lifestyle  . Physical activity:    Days per week: Patient refused    Minutes per session: Patient refused  . Stress: Rather much  Relationships  . Social connections:    Talks on phone: Patient refused    Gets together: Patient refused    Attends religious service: Patient refused    Active member of club or organization: Patient refused    Attends meetings of clubs or organizations: Patient refused    Relationship status: Patient refused  Other Topics Concern  . Not on file  Social History Narrative   Transgendered male.  Single.  Lives alone.  Independent.   Additional Social History:     Sleep: Good  Appetite:  Good  Current Medications: Current Facility-Administered Medications  Medication Dose Route Frequency Provider Last Rate Last Dose  . acetaminophen (TYLENOL) tablet 650 mg  650 mg Oral Q6H PRN Nira Conn A, NP      . alum & mag hydroxide-simeth (MAALOX/MYLANTA) 200-200-20 MG/5ML suspension 30 mL  30 mL Oral Q4H PRN Nira Conn A, NP      . ARIPiprazole (ABILIFY) tablet 5 mg  5 mg Oral Daily Micheal Likens, MD   5 mg at 03/20/18 0738  . escitalopram (LEXAPRO) tablet 20 mg  20 mg Oral Daily Micheal Likens, MD   20 mg at 03/20/18 0737  . estradiol (ESTRACE) tablet 2 mg   2 mg Oral TID Nira Conn A, NP   2 mg at 03/20/18 0738  . finasteride (PROSCAR) tablet 2.5 mg  2.5 mg Oral Daily Nira Conn A, NP   2.5 mg at 03/20/18 0738  . Ledipasvir-Sofosbuvir 90-400 MG TABS 1 tablet  1 tablet Oral Daily Nira Conn A, NP   1 tablet at 03/20/18 0738  . magnesium hydroxide (MILK OF MAGNESIA) suspension 30 mL  30 mL Oral Daily PRN Nira Conn A, NP      . mirtazapine (REMERON) tablet 7.5 mg  7.5 mg Oral QHS Nira Conn A, NP   7.5 mg at 03/19/18 2058  . nicotine (NICODERM CQ - dosed in mg/24 hours) patch 21 mg  21 mg Transdermal Daily Micheal Likens, MD   21 mg at 03/18/18 0813  . nicotine polacrilex (NICORETTE) gum 2 mg  2 mg Oral PRN Cobos, Rockey Situ, MD   2 mg at 03/20/18 1013  . prazosin (MINIPRESS)  capsule 1 mg  1 mg Oral QHS Nira Conn A, NP   1 mg at 03/19/18 2059  . spironolactone (ALDACTONE) tablet 50 mg  50 mg Oral Daily Nira Conn A, NP   50 mg at 03/20/18 0981    Lab Results:  Results for orders placed or performed during the hospital encounter of 03/13/18 (from the past 48 hour(s))  Lipid panel     Status: Abnormal   Collection Time: 03/20/18  6:19 AM  Result Value Ref Range   Cholesterol 201 (H) 0 - 200 mg/dL   Triglycerides 191 (H) <150 mg/dL   HDL 57 >47 mg/dL   Total CHOL/HDL Ratio 3.5 RATIO   VLDL 44 (H) 0 - 40 mg/dL   LDL Cholesterol 829 (H) 0 - 99 mg/dL    Comment:        Total Cholesterol/HDL:CHD Risk Coronary Heart Disease Risk Table                     Men   Women  1/2 Average Risk   3.4   3.3  Average Risk       5.0   4.4  2 X Average Risk   9.6   7.1  3 X Average Risk  23.4   11.0        Use the calculated Patient Ratio above and the CHD Risk Table to determine the patient's CHD Risk.        ATP III CLASSIFICATION (LDL):  <100     mg/dL   Optimal  562-130  mg/dL   Near or Above                    Optimal  130-159  mg/dL   Borderline  865-784  mg/dL   High  >696     mg/dL   Very High Performed at [facility], [address], [place], [place] [address]   Hemoglobin A1c     Status: None   Collection Time: 03/20/18  6:19 AM  Result Value Ref Range   Hgb A1c MFr Bld 5.0 4.8 - 5.6 %    Comment: (NOTE) Pre diabetes:          5.7%-6.4% Diabetes:              >6.4% Glycemic control for   <7.0% adults with diabetes    Mean Plasma Glucose 96.8 mg/dL    Comment: Performed at [facility], [address] [place], [place] [address]    Blood Alcohol level:  Lab Results  Component Value Date   ETH <10 03/12/2018   ETH <10 06/12/2017    Metabolic Disorder Labs: Lab Results  Component Value Date   HGBA1C 5.0 03/20/2018   MPG 96.8 03/20/2018   MPG 108 11/17/2017   Lab Results  Component Value Date   PROLACTIN 14.2 11/17/2017   PROLACTIN 6.3 09/09/2016   Lab Results  Component Value Date   CHOL 201 (H) 03/20/2018   TRIG 219 (H) 03/20/2018   HDL 57 03/20/2018   CHOLHDL 3.5 03/20/2018   VLDL 44 (H) 03/20/2018   LDLCALC 100 (H) 03/20/2018   LDLCALC 97 11/17/2017    Physical Findings: AIMS: Facial and Oral Movements Muscles of Facial Expression: None, normal Lips and Perioral Area: None, normal Jaw: None, normal Tongue: None, normal,Extremity Movements Upper (arms, wrists, hands, fingers): None, normal Lower (legs, knees, ankles, toes): None, normal, Trunk Movements Neck, shoulders, hips: None, normal,  Overall Severity Severity of abnormal movements (highest score from questions above): None, normal Incapacitation due to abnormal movements: None, normal Patient's awareness of abnormal movements (rate only patient's report): No Awareness, Dental Status Current problems with teeth and/or dentures?: No Does patient usually wear dentures?: No  CIWA:  CIWA-Ar Total: 2 COWS:  COWS Total Score: 2  Musculoskeletal: Strength & Muscle Tone: within normal limits Gait & Station: normal Patient leans: N/A  Psychiatric Specialty Exam: Physical Exam  Nursing  note and vitals reviewed. Constitutional: She appears well-developed.  Psychiatric: Her speech is normal. Thought content normal. Her affect is not blunt, not labile and not inappropriate. She is withdrawn. She exhibits a depressed mood. She exhibits abnormal remote memory.    Review of Systems  Constitutional: Negative.  Negative for chills and fever.  Respiratory: Negative for cough and shortness of breath.   Cardiovascular: Negative for chest pain.  Gastrointestinal: Negative for abdominal pain, heartburn, nausea and vomiting.  Psychiatric/Behavioral: Positive for depression and substance abuse. Negative for suicidal ideas. The patient is not nervous/anxious.     Blood pressure 122/82, pulse 82, temperature 97.7 F (36.5 C), temperature source Oral, resp. rate 20, height 5\' 8"  (1.727 m), weight 80.7 kg.Body mass index is 27.06 kg/m.  General Appearance: Casual and Fairly Groomed  Eye Contact:  Good  Speech:  Clear and Coherent and Normal Rate  Volume:  Normal  Mood:  Euthymic  Affect:  Appropriate and Congruent  Thought Process:  Coherent and Goal Directed  Orientation:  Full (Time, Place, and Person)  Thought Content:  Logical  Suicidal Thoughts:  No  Homicidal Thoughts:  No  Memory:  Immediate;   Fair Recent;   Fair Remote;   Fair  Judgement:  Fair  Insight:  Fair  Psychomotor Activity:  Normal  Concentration:  Concentration: Fair  Recall:  Poor  Fund of Knowledge:  Fair  Language:  Fair  Akathisia:  No  Handed:    AIMS (if indicated):     Assets:  Resilience Social Support  ADL's:  Intact  Cognition:  WNL  Sleep:  Number of Hours: 6   Treatment Plan Summary: Daily contact with patient to assess and evaluate symptoms and progress in treatment and Medication management  No new changes to be identigfied or adjusted. Plan below reviewed 03/20/2018, no changes.  -Continue inpatient hospitalization  -MDD recurrent, severe, without psychotic  features -Continuelexapro 20mg  po qDay -Continueabilify 5mg  po qDay  -Hormone treatment for gender transition -Continue estradiol 2mg  po TID -Continue finasteride 2.5mg  po qDay -Continue spironolactone 50mg  po qDay  -anxiety  -Continue vistaril 25mg  po q6h prn anxiety  -Hepatitis C -Continue Harvoni 90-400mg  take 1 tablet po qDay  -nightmares associated with previous trauma -Continue prazosin 1mg  po qhs  -Encourage participation in groups and therapeutic milieu  -disposition planning will be ongoing   Maryagnes Amos, FNP 03/20/2018, 12:06 PM

## 2018-03-20 NOTE — Progress Notes (Signed)
D. Pt observed sitting in dayroom quietly- interacts appropriately with peers- friendly- smiles upon approach. Per pt's self inventory, pt rates her depression, hopelessness and anxiety a 5/4/5, respectively. Pt writes that her most important goal today is "preparing for treatment on Wednesday". Pt currently denies SI/HI and AV hallucinations. A. Labs and vitals monitored. Pt compliant with medications. Pt supported emotionally and encouraged to express concerns and ask questions.   R. Pt remains safe with 15 minute checks. Will continue POC.

## 2018-03-20 NOTE — [facility] Group Notes (Signed)
[facility] Group Notes:  (Nursing)  Date:  03/20/2018  Time:130 PM Type of Therapy:  Nurse Education  Participation Level:  Did Not Attend   Shela Nevin 03/20/2018, 2:39 PM

## 2018-03-20 NOTE — Progress Notes (Signed)
Patient did attend the evening speaker AA meeting.  

## 2018-03-20 NOTE — [facility] Group Notes (Signed)
[facility] LCSW Group Therapy Note  03/20/2018  10:00-11:00AM  Type of Therapy and Topic:  Group Therapy:  Adding Supports Including Being Your Own Support  Participation Level:  Active   Description of Group:  Patients in this group were introduced to the concept that additional supports including self-support are an essential part of recovery.  A song entitled "I Need Help!" was played and a group discussion was held in reaction to the idea of needing to add supports.  A song entitled "My Own Hero" was played and a group discussion ensued in which patients stated they could relate to the song and it inspired them to realize they have be willing to help themselves in order to succeed, because other people cannot achieve sobriety or stability for them.  We discussed adding a variety of healthy supports to address the various needs in their lives.  A song was played called "I Know Where I've Been" toward the end of group and used to conduct an inspirational wrap-up to group of remembering how far they have already come in their journey.  Therapeutic Goals: 1)  demonstrate the importance of being a part of one's own support system 2)  discuss reasons people in one's life may eventually be unable to be continually supportive  3)  identify the patient's current support system and   4)  elicit commitments to add healthy supports and to become more conscious of being self-supportive   Summary of Patient Progress:  The patient expressed that she has 3 friends who are very supportive and 2 people who were very supportive who are now deceased.  The patient did not respond much during group, smiled for the most part without really indicating how she felt about the discussion, which was pointed out to her by another patient.  She again just smiled and did not respond.   Therapeutic Modalities:   Motivational Interviewing Activity  Lynnell Chad

## 2018-03-21 NOTE — [facility] Group Notes (Signed)
Adult Psychoeducational Group Note  Date:  03/21/2018 Time:  9:20 AM  Group Topic/Focus:  Orientation:   The focus of this group is to educate the patient on the purpose and policies of crisis stabilization and provide a format to answer questions about their admission.  The group details unit policies and expectations of patients while admitted.  Participation Level:  Active  Participation Quality:  Appropriate  Affect:  Appropriate  Cognitive:  Appropriate  Insight: Appropriate  Engagement in Group:  Engaged  Modes of Intervention:  Orientation  Additional Comments:  Pt attended orientation group facilitated by Jacquelyne Balint 03/21/2018, 9:20 AM

## 2018-03-21 NOTE — Progress Notes (Signed)
[facility] MD Progress Note  03/21/2018 10:40 AM Laderrick Wilk  MRN:  [id number]  Subjective: Randy [name] reports, "My depression is a little better today. I'm just ready to go, will be going to [facility] treatment center on Wednesday, 03-23-18. I'm going to Rehab to learn how to control the urge to use".  HPI :47 year old transgender male,known to our service from prior admissions, most recently in July 2019 for depression, suicidal ideations. At the time was diagnosed with MDD and Cocaine Use Disorder. Was discharged on Abilify, Lexapro, Remeron, Minipress.  Randy [name] is seen, chart reviewed. The chart findings discussed with the treatment. She reports her depression is a little better today. Rates depression & anxiety #4 (1-10) scale. She is taking & tolerating her medications well, and she denies any side effects. States she believes that her mood is improving since medications were re-started on this admission. She denies any specific concerns. She is sleeping well. Her appetite is good. She denies other physical complaints. She denies any SI/HI/AH/VH. She remains in agreement to be discharged directly to [facility] on 03/23/18. She is looking forward to going to the [facility] treatment Center. She is in  agreement to continue her current treatment regimen as already in progress. No changes made. Randy [name] does not appear to be responding to any internal stimuli.  Principal Problem: Severe recurrent major depression without psychotic features (HCC) Diagnosis:   Patient Active Problem List   Diagnosis Date Noted  . Severe recurrent major depression without psychotic features (HCC) [F33.2] 11/16/2017  . Liver fibrosis [K74.0] 06/22/2017  . MDD (major depressive disorder), single episode, severe , no psychosis (HCC) [F32.2] 06/11/2017  . Cough [R05] 03/14/2015  . Major depressive disorder, recurrent, severe without psychotic features (HCC) [F33.2]   . Opioid dependence with opioid-induced mood  disorder (HCC) [F11.24]   . Cocaine dependence with cocaine-induced mood disorder (HCC) [F14.24]   . MDD (major depressive disorder) [F32.9] 09/17/2014  . MDD (major depressive disorder), recurrent severe, without psychosis (HCC) [F33.2] 07/05/2014  . Major depression, recurrent (HCC) [F33.9] 04/10/2012  . Cocaine dependence (HCC) [F14.20] 04/10/2012  . Polysubstance abuse including IVDA (heroin), cocaine, marijuana [F19.10] 01/10/2012  . Elevated LFTs [R94.5] 01/10/2012  . Chronic hepatitis C without hepatic coma (HCC) [B18.2] 01/10/2012  . 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 Tobacco abuse [Z72.0] 01/10/2012  . Anxiety disorder [F41.9] 08/14/2011  . Recurrent major depression-severe (HCC) [F33.2] 08/13/2011   Total Time spent with patient: 15 minutes  Past Psychiatric History: See H&P  Past Medical History:  Past Medical History:  Diagnosis Date  . Cellulitis  Of  Left Forearm 01/10/2012   From IVDA  . Depression   . Hepatitis C   . 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 I&D EXTREMITY  01/13/2012   Procedure: IRRIGATION AND DEBRIDEMENT EXTREMITY;  Surgeon: Kennieth Rad, MD;  Location: [facility] ORS;  Service: Orthopedics;  Laterality: Left;   Family History:  Family History  Problem Relation Age of Onset  . Idiopathic pulmonary fibrosis Father    Family Psychiatric  History: See H&P  Social History:  Social History   Substance and Sexual Activity  Alcohol Use Yes   Comment: occ     Social History   Substance and Sexual Activity  Drug Use Yes  . Types: Heroin, "Crack" cocaine, Marijuana   Comment: heroin last used 07/14/14, crack/ had some a few days ago  Social History   Socioeconomic History  . Marital status: Single    Spouse name: Not on file  . Number of children: Not on file  . Years of education: Not on file  . Highest education level: Not on file   Occupational History  . 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 Financial resource strain: Very hard  . Food insecurity:    Worry: Often true    Inability: Often true  . Transportation needs:    Medical: Yes    Non-medical: Yes  Tobacco Use  . Smoking status: Current Every Day Smoker    Packs/day: 0.50    Years: 20.00    Pack years: 10.00    Types: Cigarettes  . Smokeless tobacco: Never Used  . Tobacco comment: trying to quit-using e-cig some  Substance and Sexual Activity  . Alcohol use: Yes    Comment: occ  . Drug use: Yes    Types: Heroin, "Crack" cocaine, Marijuana    Comment: heroin last used 07/14/14, crack/ had some a few days ago  . Sexual activity: Yes    Partners: Male    Birth control/protection: Condom    Comment: Has been HIV tested in past and has been negative.  Lifestyle  . Physical activity:    Days per week: Patient refused    Minutes per session: Patient refused  . Stress: Rather much  Relationships  . Social connections:    Talks on phone: Patient refused    Gets together: Patient refused    Attends religious service: Patient refused    Active member of club or organization: Patient refused    Attends meetings of clubs or organizations: Patient refused    Relationship status: Patient refused  Other Topics Concern  . Not on file  Social History Narrative   Transgendered male.  Single.  Lives alone.  Independent.   Additional Social History:     Sleep: Good  Appetite:  Good  Current Medications: Current Facility-Administered Medications  Medication Dose Route Frequency Provider Last Rate Last Dose  . acetaminophen (TYLENOL) tablet 650 mg  650 mg Oral Q6H PRN Nira Conn A, NP      . alum & mag hydroxide-simeth (MAALOX/MYLANTA) 200-200-20 MG/5ML suspension 30 mL  30 mL Oral Q4H PRN Nira Conn A, NP      . ARIPiprazole (ABILIFY) tablet 5 mg  5 mg Oral Daily Micheal Likens, MD   5 mg at 03/21/18 0751  .  escitalopram (LEXAPRO) tablet 20 mg  20 mg Oral Daily Micheal Likens, MD   20 mg at 03/21/18 0751  . estradiol (ESTRACE) tablet 2 mg  2 mg Oral TID Nira Conn A, NP   2 mg at 03/21/18 0751  . finasteride (PROSCAR) tablet 2.5 mg  2.5 mg Oral Daily Nira Conn A, NP   2.5 mg at 03/21/18 0751  . Ledipasvir-Sofosbuvir 90-400 MG TABS 1 tablet  1 tablet Oral Daily Nira Conn A, NP   1 tablet at 03/21/18 0753  . magnesium hydroxide (MILK OF MAGNESIA) suspension 30 mL  30 mL Oral Daily PRN Nira Conn A, NP      . mirtazapine (REMERON) tablet 7.5 mg  7.5 mg Oral QHS Nira Conn A, NP   7.5 mg at 03/20/18 2106  . nicotine (NICODERM CQ - dosed in mg/24 hours) patch 21 mg  21 mg Transdermal Daily Micheal Likens, MD   21 mg at 03/18/18 0813  . nicotine  polacrilex (NICORETTE) gum 2 mg  2 mg Oral PRN Cobos, Rockey Situ, MD   2 mg at 03/21/18 0924  . prazosin (MINIPRESS) capsule 1 mg  1 mg Oral QHS Nira Conn A, NP   1 mg at 03/20/18 2106  . spironolactone (ALDACTONE) tablet 50 mg  50 mg Oral Daily Nira Conn A, NP   50 mg at 03/21/18 1610   Lab Results:  Results for orders placed or performed during the hospital encounter of 03/13/18 (from the past 48 hour(s))  Lipid panel     Status: Abnormal   Collection Time: 03/20/18  6:19 AM  Result Value Ref Range   Cholesterol 201 (H) 0 - 200 mg/dL   Triglycerides 960 (H) <150 mg/dL   HDL 57 >45 mg/dL   Total CHOL/HDL Ratio 3.5 RATIO   VLDL 44 (H) 0 - 40 mg/dL   LDL Cholesterol 409 (H) 0 - 99 mg/dL    Comment:        Total Cholesterol/HDL:CHD Risk Coronary Heart Disease Risk Table                     Men   Women  1/2 Average Risk   3.4   3.3  Average Risk       5.0   4.4  2 X Average Risk   9.6   7.1  3 X Average Risk  23.4   11.0        Use the calculated Patient Ratio above and the CHD Risk Table to determine the patient's CHD Risk.        ATP III CLASSIFICATION (LDL):  <100     mg/dL   Optimal  811-914  mg/dL   Near or  Above                    Optimal  130-159  mg/dL   Borderline  782-956  mg/dL   High  >213     mg/dL   Very High Performed at [facility], [address] [place], [place] [address]   Hemoglobin A1c     Status: None   Collection Time: 03/20/18  6:19 AM  Result Value Ref Range   Hgb A1c MFr Bld 5.0 4.8 - 5.6 %    Comment: (NOTE) Pre diabetes:          5.7%-6.4% Diabetes:              >6.4% Glycemic control for   <7.0% adults with diabetes    Mean Plasma Glucose 96.8 mg/dL    Comment: Performed at [facility], [address] [place], [place] [address]    Blood Alcohol level:  Lab Results  Component Value Date   [facility] <10 03/12/2018   ETH <10 06/12/2017    Metabolic Disorder Labs: Lab Results  Component Value Date   HGBA1C 5.0 03/20/2018   MPG 96.8 03/20/2018   MPG 108 11/17/2017   Lab Results  Component Value Date   PROLACTIN 14.2 11/17/2017   PROLACTIN 6.3 09/09/2016   Lab Results  Component Value Date   CHOL 201 (H) 03/20/2018   TRIG 219 (H) 03/20/2018   HDL 57 03/20/2018   CHOLHDL 3.5 03/20/2018   VLDL 44 (H) 03/20/2018   LDLCALC 100 (H) 03/20/2018   LDLCALC 97 11/17/2017    Physical Findings: AIMS: Facial and Oral Movements Muscles of Facial Expression: None, normal Lips and Perioral Area: None, normal Jaw: None, normal Tongue:  None, normal,Extremity Movements Upper (arms, wrists, hands, fingers): None, normal Lower (legs, knees, ankles, toes): None, normal, Trunk Movements Neck, shoulders, hips: None, normal, Overall Severity Severity of abnormal movements (highest score from questions above): None, normal Incapacitation due to abnormal movements: None, normal Patient's awareness of abnormal movements (rate only patient's report): No Awareness, Dental Status Current problems with teeth and/or dentures?: No Does patient usually wear dentures?: No  CIWA:  CIWA-Ar Total: 2 COWS:  COWS Total Score:  2  Musculoskeletal: Strength & Muscle Tone: within normal limits Gait & Station: normal Patient leans: N/A  Psychiatric Specialty Exam: Physical Exam  Nursing note and vitals reviewed. Constitutional: She appears well-developed.  Psychiatric: Her speech is normal. Thought content normal. Her affect is not blunt, not labile and not inappropriate. She is withdrawn. She exhibits a depressed mood. She exhibits abnormal remote memory.    Review of Systems  Constitutional: Negative.  Negative for chills and fever.  Respiratory: Negative for cough and shortness of breath.   Cardiovascular: Negative for chest pain.  Gastrointestinal: Negative for abdominal pain, heartburn, nausea and vomiting.  Psychiatric/Behavioral: Positive for depression and substance abuse. Negative for suicidal ideas. The patient is not nervous/anxious.     Blood pressure 122/81, pulse 93, temperature 98.6 F (37 C), temperature source Oral, resp. rate 20, height 5\' 8"  (1.727 m), weight 80.7 kg.Body mass index is 27.06 kg/m.  General Appearance: Casual and Fairly Groomed  Eye Contact:  Good  Speech:  Clear and Coherent and Normal Rate  Volume:  Normal  Mood:  Euthymic  Affect:  Appropriate and Congruent  Thought Process:  Coherent and Goal Directed  Orientation:  Full (Time, Place, and Person)  Thought Content:  Logical  Suicidal Thoughts:  No  Homicidal Thoughts:  No  Memory:  Immediate;   Fair Recent;   Fair Remote;   Fair  Judgement:  Fair  Insight:  Fair  Psychomotor Activity:  Normal  Concentration:  Concentration: Fair  Recall:  Poor  Fund of Knowledge:  Fair  Language:  Fair  Akathisia:  No  Handed:    AIMS (if indicated):     Assets:  Resilience Social Support  ADL's:  Intact  Cognition:  WNL  Sleep:  Number of Hours: 5.75   Treatment Plan Summary: Daily contact with patient to assess and evaluate symptoms and progress in treatment and Medication management    -Continue inpatient  hospitalization.  -Will continue today 03/21/2018 plan as below except where it is noted.  -MDD recurrent, severe, without psychotic features -Continuelexapro 20mg  po qDay -Continueabilify 5mg  po qDay  -Hormone treatment for gender transition -Continue estradiol 2mg  po TID -Continue finasteride 2.5mg  po qDay -Continue spironolactone 50mg  po qDay  -anxiety  -Continue vistaril 25mg  po q6h prn anxiety  -Hepatitis C -Continue Harvoni 90-400mg  take 1 tablet po qDay  -nightmares associated with previous trauma -Continue prazosin 1mg  po qhs  -Encourage participation in groups and therapeutic milieu  -disposition planning will be ongoing  Armandina Stammer, NP, pmhnp, fnp-bc 03/21/2018, 10:40 AMPatient ID: [name], adult   DOB: 25-May-1970, 47 y.o.   MRN: [id number]

## 2018-03-21 NOTE — BHH Group Notes (Signed)
Pt attended spiritual care group on grief and loss facilitated by chaplain Avner Stroder   Group goal of establishing open and affirming space for members to engage grief, normalize and support grief experience and provide psycho social education and grief support. Group opened with brief discussion and psycho-social ed around grief and loss in relationships and in relation to self - identifying life patterns, circumstances, changes connected to grief response. Group participated in facilitated process group around topic of grief.   

## 2018-03-21 NOTE — Progress Notes (Signed)
Recreation Therapy Notes  Date: 11.11.19 Time: 0930 Location: 300 Hall Dayroom  Group Topic: Stress Management  Goal Area(s) Addresses:  Patient will verbalize importance of using healthy stress management.  Patient will identify positive emotions associated with healthy stress management.   Behavioral Response: Engaged  Intervention: Stress Management  Activity :  Progressive Muscle Relaxation.  LRT introduced the stress management technique of progressive muscle relaxation.  LRT read a script to lead patients through a series of exercises that allowed them to tense and then relax each muscle group individually.    Education:  Stress Management, Discharge Planning.   Education Outcome: Acknowledges edcuation/In group clarification offered/Needs additional education  Clinical Observations/Feedback: Pt attended and participated in group.    Randy [name], LRT/CTRS         Ajani Schnieders A 03/21/2018 10:55 AM 

## 2018-03-21 NOTE — Progress Notes (Signed)
Patient ID: Randy [name], adult   DOB: 24-Oct-1970, 47 y.o.   MRN: [id number] PER STATE REGULATIONS 482.30  THIS CHART WAS REVIEWED FOR MEDICAL NECESSITY WITH RESPECT TO THE PATIENT'S ADMISSION/ DURATION OF STAY.  NEXT REVIEW DATE: 03/25/2018  Willa Rough, RN, BSN CASE MANAGER

## 2018-03-21 NOTE — Progress Notes (Signed)
Patient ID: Randy [name], adult   DOB: [birth year], 47 y.o.   MRN: [id number]  D. Pt presents with a pleasant affect and cooperative behavior. Pt reports plan to discharge to [facility] tomorrow. Pt worries about being able to afford mediations post discharge. A. Labs and vitals monitored. Pt given and educated on medications. Pt supported emotionally and encouraged to express concerns and ask questions. SW notified of patients concerns.  R. Pt remains safe with 15 minute checks. Pt currently denies SI/HI and AVH and agrees to contact staff before acting on any harmful thoughts. Will continue POC.

## 2018-03-22 MED ORDER — PRAZOSIN HCL 1 MG PO CAPS: 1.0000 mg | ORAL_CAPSULE | Freq: Every day | ORAL | 0 refills | Status: DC

## 2018-03-22 MED ORDER — FINASTERIDE 5 MG PO TABS: 2.5000 mg | ORAL_TABLET | Freq: Every day | ORAL | 0 refills | Status: DC

## 2018-03-22 MED ORDER — ARIPIPRAZOLE 5 MG PO TABS: 5.0000 mg | ORAL_TABLET | Freq: Every day | ORAL | 0 refills | Status: DC

## 2018-03-22 MED ORDER — SPIRONOLACTONE 50 MG PO TABS: 50.0000 mg | ORAL_TABLET | Freq: Every day | ORAL | 0 refills | Status: DC

## 2018-03-22 MED ORDER — NICOTINE 21 MG/24HR TD PT24: 21.0000 mg | MEDICATED_PATCH | Freq: Every day | TRANSDERMAL | 0 refills | Status: DC

## 2018-03-22 MED ORDER — ESCITALOPRAM OXALATE 20 MG PO TABS: 20.0000 mg | ORAL_TABLET | Freq: Every day | ORAL | 0 refills | Status: DC

## 2018-03-22 MED ORDER — MIRTAZAPINE 7.5 MG PO TABS: 7.5000 mg | ORAL_TABLET | Freq: Every day | ORAL | 0 refills | Status: DC

## 2018-03-22 MED ORDER — LEDIPASVIR-SOFOSBUVIR 90-400 MG PO TABS: 1.0000 | ORAL_TABLET | Freq: Every day | ORAL | 0 refills | Status: DC

## 2018-03-22 MED ORDER — ESTRADIOL 2 MG PO TABS: 2.0000 mg | ORAL_TABLET | Freq: Three times a day (TID) | ORAL | 1 refills | Status: DC

## 2018-03-22 NOTE — Plan of Care (Signed)
Patient is compliant with treatment plan. Patient denies SI HI AVH. Denies physical pain. Patient stated she is ready for discharge in the morning. Safety is maintained with 15 minute checks as well as environmental checks. Will continue to monitor.  Problem: Education: Goal: Emotional status will improve Outcome: Progressing Goal: Mental status will improve Outcome: Progressing Goal: Verbalization of understanding the information provided will improve Outcome: Progressing   Problem: Activity: Goal: Interest or engagement in activities will improve Outcome: Progressing Goal: Sleeping patterns will improve Outcome: Progressing

## 2018-03-22 NOTE — Progress Notes (Signed)
Nursing note 7p-7a  Pt observed interacting with peers on unit this shift. Displayed a flat affect and mood upon interaction with this Clinical research associatewriter. Guarded upon assessment. Pt denies pain ,denies SI/HI, and also denies any audio or visual hallucinations at this time. Pt request Nicotine gum for cravings.  Pt is able to verbally contract for safety with this RN. Pt educated on falls and encouraged to wear nonskid socks when ambulating in the halls. Pt also encouraged to call for help if feeling weak or dizzy. Pt verbalized understanding of all education provided. Pt is now resting in bed with eyes closed, with no signs or symptoms of pain or distress noted. Pt continues to remain safe on the unit and is observed by rounding every 15 min. RN will continue to monitor.

## 2018-03-22 NOTE — Progress Notes (Signed)
The patient attended the evening A.A.meeting and was appropriate.  

## 2018-03-22 NOTE — BHH Suicide Risk Assessment (Signed)
[facility] Discharge Suicide Risk Assessment   Principal Problem: Severe recurrent major depression without psychotic features [facility]) Discharge Diagnoses:  Patient Active Problem List   Diagnosis Date Noted  . Severe recurrent major depression without psychotic features (HCC) [F33.2] 11/16/2017  . Liver fibrosis [K74.0] 06/22/2017  . MDD (major depressive disorder), single episode, severe , no psychosis (HCC) [F32.2] 06/11/2017  . Cough [R05] 03/14/2015  . Major depressive disorder, recurrent, severe without psychotic features (HCC) [F33.2]   . Opioid dependence with opioid-induced mood disorder (HCC) [F11.24]   . Cocaine dependence with cocaine-induced mood disorder (HCC) [F14.24]   . MDD (major depressive disorder) [F32.9] 09/17/2014  . MDD (major depressive disorder), recurrent severe, without psychosis (HCC) [F33.2] 07/05/2014  . Major depression, recurrent (HCC) [F33.9] 04/10/2012  . Cocaine dependence (HCC) [F14.20] 04/10/2012  . Polysubstance abuse including IVDA (heroin), cocaine, marijuana [F19.10] 01/10/2012  . Elevated LFTs [R94.5] 01/10/2012  . Chronic hepatitis C without hepatic coma (HCC) [B18.2] 01/10/2012  . 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 Tobacco abuse [Z72.0] 01/10/2012  . Anxiety disorder [F41.9] 08/14/2011  . Recurrent major depression-severe (HCC) [F33.2] 08/13/2011   Total Time spent with patient: 30 minutes  Psychiatric Specialty Exam:   Blood pressure 109/83, pulse 91, temperature 98.6 F (37 C), temperature source Oral, resp. rate 14, height 5\' 8"  (1.727 m), weight 80.7 kg.Body mass index is 27.06 kg/m.   Mental Status Per Nursing Assessment::   On Admission:  Suicidal ideation indicated by patient, Suicidal ideation indicated by others, Suicide plan, Intention to act on suicide plan  Demographic Factors:  Caucasian, Gay, lesbian, or bisexual orientation, Low socioeconomic status, Living alone and Unemployed  Loss  Factors: Loss of significant relationship and Financial problems/change in socioeconomic status  Historical Factors: Prior suicide attempts, Family history of mental illness or substance abuse and Impulsivity  Risk Reduction Factors:   Positive social support, Positive therapeutic relationship and Positive coping skills or problem solving skills  Continued Clinical Symptoms:  Depression:   Comorbid alcohol abuse/dependence Impulsivity Insomnia Severe Alcohol/Substance Abuse/Dependencies More than one psychiatric diagnosis Previous Psychiatric Diagnoses and Treatments Medical Diagnoses and Treatments/Surgeries  Cognitive Features That Contribute To Risk:  None    Suicide Risk:  Minimal: No identifiable suicidal ideation.  Patients presenting with no risk factors but with morbid ruminations; may be classified as minimal risk based on the severity of the depressive symptoms  Follow-up Information    [facility] Follow up.   Why:  Your follow up appointment is Monday November 11 at 12:00 p.m. Please bring; photo ID, proof of insurance, and discharge paperwork from this hospitalization.  Contact information: [address] [place] [address] [phone]        [facility] Follow up on 03/23/2018.   Why:  Screening for possible admission on Wednesday, 11/13 at 7:45AM. Please bring: photo ID/proof of [place] residency, clothing, and 14 day supply of medications. Thank you.  Contact information: [address] [place] [address] [phone]        [facility] Follow up.   Specialty:  Addiction Medicine Why:  Referral faxed: 03/14/18 Contact information: [address] [place] [address] [phone]        [facility] Follow up on 04/26/2018.   Why:  Appointment with Dr. Clelia Croft MD on Tuesday, 12/17 at 9:00AM. Please bring photo ID and medicare card to this appt. Thank you.  Contact  information: [address] [place]  [place] [address] [phone]          Plan Of Care/Follow-up recommendations:  Activity:  as tolerated Diet:  normal Tests:  NA Other:  see above for DC plan  Micheal Likens, MD 03/22/2018, 4:48 PM

## 2018-03-22 NOTE — Discharge Summary (Signed)
Physician Discharge Summary Note  Patient:  Randy [name] is an 47 y.o., adult MRN:  [id number] DOB:  08-18-1970 Patient phone:  [phone] (home)  Patient address:   [address] [place] [address],  Total Time spent with patient: 30 minutes  Date of Admission:  03/13/2018 Date of Discharge: 03/22/2018  Reason for Admission:  Depression, SI, polysubstance abuse  Principal Problem: Severe recurrent major depression without psychotic features [facility]) Discharge Diagnoses: Patient Active Problem List   Diagnosis Date Noted  . Severe recurrent major depression without psychotic features (HCC) [F33.2] 11/16/2017  . Liver fibrosis [K74.0] 06/22/2017  . MDD (major depressive disorder), single episode, severe , no psychosis (HCC) [F32.2] 06/11/2017  . Cough [R05] 03/14/2015  . Major depressive disorder, recurrent, severe without psychotic features (HCC) [F33.2]   . Opioid dependence with opioid-induced mood disorder (HCC) [F11.24]   . Cocaine dependence with cocaine-induced mood disorder (HCC) [F14.24]   . MDD (major depressive disorder) [F32.9] 09/17/2014  . MDD (major depressive disorder), recurrent severe, without psychosis (HCC) [F33.2] 07/05/2014  . Major depression, recurrent (HCC) [F33.9] 04/10/2012  . Cocaine dependence (HCC) [F14.20] 04/10/2012  . Polysubstance abuse including IVDA (heroin), cocaine, marijuana [F19.10] 01/10/2012  . Elevated LFTs [R94.5] 01/10/2012  . Chronic hepatitis C without hepatic coma (HCC) [B18.2] 01/10/2012  . 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 Tobacco abuse [Z72.0] 01/10/2012  . Anxiety disorder [F41.9] 08/14/2011  . Recurrent major depression-severe (HCC) [F33.2] 08/13/2011    Past Psychiatric History: see H&P  Past Medical History:  Past Medical History:  Diagnosis Date  . Cellulitis  Of  Left Forearm 01/10/2012   From IVDA  . Depression   . Hepatitis C   . Hormonal imbalance in transgender patient  01/10/2012    Past Surgical History:  Procedure Laterality Date  . I&D EXTREMITY  01/13/2012   Procedure: IRRIGATION AND DEBRIDEMENT EXTREMITY;  Surgeon: Kennieth Rad, MD;  Location: [facility];  Service: Orthopedics;  Laterality: Left;   Family History:  Family History  Problem Relation Age of Onset  . Idiopathic pulmonary fibrosis Father    Family Psychiatric  History: see H&P Social History:  Social History   Substance and Sexual Activity  Alcohol Use Yes   Comment: occ     Social History   Substance and Sexual Activity  Drug Use Yes  . Types: Heroin, "Crack" cocaine, Marijuana   Comment: heroin last used 07/14/14, crack/ had some a few days ago    Social History   Socioeconomic History  . Marital status: Single    Spouse name: Not on file  . Number of children: Not on file  . Years of education: Not on file  . Highest education level: Not on file  Occupational History  . 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 Financial resource strain: Very hard  . Food insecurity:    Worry: Often true    Inability: Often true  . Transportation needs:    Medical: Yes    Non-medical: Yes  Tobacco Use  . Smoking status: Current Every Day Smoker    Packs/day: 0.50    Years: 20.00    Pack years: 10.00    Types: Cigarettes  . Smokeless tobacco: Never Used  . Tobacco comment: trying to quit-using e-cig some  Substance and Sexual Activity  . Alcohol use: Yes    Comment: occ  . Drug use: Yes    Types: Heroin, "Crack" cocaine,  Marijuana    Comment: heroin last used 07/14/14, crack/ had some a few days ago  . Sexual activity: Yes    Partners: Male    Birth control/protection: Condom    Comment: Has been HIV tested in past and has been negative.  Lifestyle  . Physical activity:    Days per week: Patient refused    Minutes per session: Patient refused  . Stress: Rather much  Relationships  . Social connections:    Talks on phone: Patient refused     Gets together: Patient refused    Attends religious service: Patient refused    Active member of club or organization: Patient refused    Attends meetings of clubs or organizations: Patient refused    Relationship status: Patient refused  Other Topics Concern  . Not on file  Social History Narrative   Transgendered male.  Single.  Lives alone.  Independent.    Hospital Course:    History as per psychiatric intake suicide risk assessment: 47 year old transgender male,known to our service from prior admissions, most recently in July 2019 for depression, suicidal ideations. At the time was diagnosed with MDD and Cocaine Use Disorder. Was discharged on Abilify, Lexapro, Remeron, Minipress. Patient presented for worsening depression, suicidal ideations, with thoughts of overdosing. Describes anhedonia, decreased appetite, decreased energy level, poor sleep. Denies hallucinations. States that a few weeks ago she ran into a friend of her deceased BF Randy [name] ( who passed away in January 2019) and states " everything seemed to go downhill from there after that". She reports recently relapsing on cocaine and heroin . States she had used " constantly for about 10 days , but none before then". Of note, states she has not used heroin over the last 5 days. Admission UDS positive for cocaine , but not for opiates. Admission BAL negative. Denies any alcohol or BZD abuse . States she had stopped her psychiatric medications about a month ago.  As per evaluation today: Today upon evaluation, pt shares, "I'm doing better than when I came in." She denies any specific concerns. She is sleeping well. Her appetite is good. She denies other physical complaints. She denies SI/HI/AH/VH. She is tolerating her medications well, and she is in agreement to continue her current regimen without changes. She remains in agreement with plan to [facility] substance use treatment in the AM. She  was able to engage in safety planning including [facility] or contact emergency services if she feels unable to maintain her own safety or the safety of others. Pt had no further questions, comments, or concerns.   The patient is at low risk of imminent suicide. Patient denied thoughts, intent, or plan for harm to self or others, expressed significant future orientation, and expressed an ability to mobilize assistance for her needs. She is presently void of any contributing psychiatric symptoms, cognitive difficulties, or substance use which would elevate her risk for lethality. Chronic risk for lethality is elevated in light of poor social support, poor adherence, and impulsivity. The chronic risk is presently mitigated by her ongoing desire and [facility] and mobilization of support from family and friends. Chronic risk may elevate if she experiences any significant loss or worsening of symptoms, which can be managed and monitored through outpatient providers. At this time, acute risk for lethality is low and she is stable for ongoing outpatient management.    Modifiable risk factors were addressed during this hospitalization  through appropriate pharmacotherapy and establishment of outpatient follow-up treatment. Some risk factors for suicide are situational (i.e. Unstable social support) or related personality pathology (i.e. Poor coping mechanisms) and thus cannot be further mitigated by continued hospitalization in this setting.    Physical Findings: AIMS: Facial and Oral Movements Muscles of Facial Expression: None, normal Lips and Perioral Area: None, normal Jaw: None, normal Tongue: None, normal,Extremity Movements Upper (arms, wrists, hands, fingers): None, normal Lower (legs, knees, ankles, toes): None, normal, Trunk Movements Neck, shoulders, hips: None, normal, Overall Severity Severity of abnormal movements (highest score from questions above): None,  normal Incapacitation due to abnormal movements: None, normal Patient's awareness of abnormal movements (rate only patient's report): No Awareness, Dental Status Current problems with teeth and/or dentures?: No Does patient usually wear dentures?: No  CIWA:  CIWA-Ar Total: 2 COWS:  COWS Total Score: 2  Musculoskeletal: Strength & Muscle Tone: within normal limits Gait & Station: normal Patient leans: N/A  Psychiatric Specialty Exam: Physical Exam  Nursing note and vitals reviewed.   Review of Systems  Constitutional: Negative for chills and fever.  Respiratory: Negative for cough and shortness of breath.   Cardiovascular: Negative for chest pain.  Gastrointestinal: Negative for abdominal pain, heartburn, nausea and vomiting.  Psychiatric/Behavioral: Negative for depression, hallucinations and suicidal ideas. The patient is not nervous/anxious and does not have insomnia.     Blood pressure 109/83, pulse 91, temperature 98.6 F (37 C), temperature source Oral, resp. rate 14, height 5\' 8"  (1.727 m), weight 80.7 kg.Body mass index is 27.06 kg/m.  General Appearance: Casual and Fairly Groomed  Eye Contact:  Good  Speech:  Clear and Coherent and Normal Rate  Volume:  Normal  Mood:  Euthymic  Affect:  Appropriate and Congruent  Thought Process:  Coherent and Goal Directed  Orientation:  Full (Time, Place, and Person)  Thought Content:  Logical  Suicidal Thoughts:  No  Homicidal Thoughts:  No  Memory:  Immediate;   Fair Recent;   Fair Remote;   Fair  Judgement:  Fair  Insight:  Fair  Psychomotor Activity:  Normal  Concentration:  Concentration: Fair  Recall:  Fair  Fund of Knowledge:  Fair  Language:  Fair  Akathisia:  No  Handed:    AIMS (if indicated):     Assets:  Communication Skills Desire for Improvement Resilience  ADL's:  Intact  Cognition:  WNL  Sleep:  Number of Hours: 4.5     Have you used any form of tobacco in the last 30 days? (Cigarettes, Smokeless  Tobacco, Cigars, and/or Pipes): Yes  Has this patient used any form of tobacco in the last 30 days? (Cigarettes, Smokeless Tobacco, Cigars, and/or Pipes) Yes, Yes, A prescription for an FDA-approved tobacco cessation medication was offered at discharge and the patient refused  Blood Alcohol level:  Lab Results  Component Value Date   [facility] <10 03/12/2018   ETH <10 06/12/2017    Metabolic Disorder Labs:  Lab Results  Component Value Date   HGBA1C 5.0 03/20/2018   MPG 96.8 03/20/2018   MPG 108 11/17/2017   Lab Results  Component Value Date   PROLACTIN 14.2 11/17/2017   PROLACTIN 6.3 09/09/2016   Lab Results  Component Value Date   CHOL 201 (H) 03/20/2018   TRIG 219 (H) 03/20/2018   HDL 57 03/20/2018   CHOLHDL 3.5 03/20/2018   VLDL 44 (H) 03/20/2018   LDLCALC 100 (H) 03/20/2018   LDLCALC 97 11/17/2017    See Psychiatric  Specialty Exam and Suicide Risk Assessment completed by Attending Physician prior to discharge.  Discharge destination:  [facility]  Is patient on multiple antipsychotic therapies at discharge:  No   Has Patient had three or more failed trials of antipsychotic monotherapy by history:  No  Recommended Plan for Multiple Antipsychotic Therapies: NA   Allergies as of 03/22/2018      Reactions   Penicillins Other (See Comments)   Has patient had a PCN reaction causing immediate rash, facial/tongue/throat swelling, SOB or lightheadedness with hypotension: no Has patient had a PCN reaction causing severe rash involving mucus membranes or skin necrosis: No Has patient had a PCN reaction that required hospitalization: No Has patient had a PCN reaction occurring within the last 10 years: Yes If all of the above answers are "NO", then may proceed with Cephalosporin use. Swollen Joints      Medication List    STOP taking these medications   hydrOXYzine 25 MG tablet Commonly known as:  ATARAX/VISTARIL     TAKE these medications     Indication   ARIPiprazole 5 MG tablet Commonly known as:  ABILIFY Take 1 tablet (5 mg total) by mouth daily. Start taking on:  03/23/2018 What changed:    medication strength  how much to take  Indication:  Major Depressive Disorder   escitalopram 20 MG tablet Commonly known as:  LEXAPRO Take 1 tablet (20 mg total) by mouth daily. Start taking on:  03/23/2018 What changed:    medication strength  how much to take  Indication:  Generalized Anxiety Disorder, Major Depressive Disorder   estradiol 2 MG tablet Commonly known as:  ESTRACE Take 1 tablet (2 mg total) by mouth 3 (three) times daily. What changed:  additional instructions  Indication:  Gender transition   finasteride 5 MG tablet Commonly known as:  PROSCAR Take 0.5 tablets (2.5 mg total) by mouth daily. Start taking on:  03/23/2018 What changed:  additional instructions  Indication:  Gender transition   Ledipasvir-Sofosbuvir 90-400 MG Tabs Take 1 tablet by mouth daily.  Indication:  Hepatitis C   mirtazapine 7.5 MG tablet Commonly known as:  REMERON Take 1 tablet (7.5 mg total) by mouth at bedtime.  Indication:  Major Depressive Disorder   nicotine 21 mg/24hr patch Commonly known as:  NICODERM CQ - dosed in mg/24 hours Place 1 patch (21 mg total) onto the skin daily. Start taking on:  03/23/2018 What changed:  additional instructions  Indication:  Nicotine Addiction   prazosin 1 MG capsule Commonly known as:  MINIPRESS Take 1 capsule (1 mg total) by mouth at bedtime.  Indication:  Nightmares   spironolactone 50 MG tablet Commonly known as:  ALDACTONE Take 1 tablet (50 mg total) by mouth daily. Start taking on:  03/23/2018 What changed:  additional instructions  Indication:  gender transition      Follow-up Information    [facility] Follow up.   Why:  Your follow up appointment is Monday November 11 at 12:00 p.m. Please bring; photo ID, proof of insurance, and discharge paperwork from this hospitalization.   Contact information: [address] [place] [address] [phone]        [facility] Follow up on 03/23/2018.   Why:  Screening for possible admission on Wednesday, 11/13 at 7:45AM. Please bring: photo ID/proof of [place] residency, clothing, and 14 day supply of medications. Thank you.  Contact information: [address] [place] [address] [phone]  [facility] Follow up.   Specialty:  Addiction Medicine Why:  Referral faxed: 03/14/18 Contact information: [address] [place] [address] [phone]        [facility] Follow up on 04/26/2018.   Why:  Appointment with Dr. Clelia Croft MD on Tuesday, 12/17 at 9:00AM. Please bring photo ID and medicare card to this appt. Thank you.  Contact information: [address] [place] [address] [phone]          Follow-up recommendations:  Activity:  as tolerated Diet:  normal Tests:  NA Other:  see above for DC plan  Comments:    Signed: Micheal Likens, MD 03/22/2018, 4:47 PM

## 2018-03-22 NOTE — Progress Notes (Signed)
  [facility] Adult Case Management Discharge Plan :  Will you be returning to the same living situation after discharge:  No.Pt plans to attend [facility] for screening and possible admission.  At discharge, do you have transportation home?: Yes,  taxi voucher in chart. PT MUST DISCHARGE BY 7AM ON WEDNESDAY, 03/22/18 TO GET TO [facility] FOR 7:45am SCREENING. Do you have the ability to pay for your medications: Yes,  medicare  Release of information consent forms completed and submitted to medical records by CSW.  Patient to Follow up at: Follow-up Information    [facility] Follow up.   Why:  Your follow up appointment is Monday November 11 at 12:00 p.m. Please bring; photo ID, proof of insurance, and discharge paperwork from this hospitalization.  Contact information: [address] [place] [address] [phone]        [facility] Follow up on 03/23/2018.   Why:  Screening for possible admission on Wednesday, 11/13 at 7:45AM. Please bring: photo ID/proof of [place] residency, clothing, and 14 day supply of medications. Thank you.  Contact information: [address] [place] [address] [phone]        [facility] Follow up.   Specialty:  Addiction Medicine Why:  Referral faxed: 03/14/18 Contact information: [address] [place] [address] [phone]        [facility] Follow up on 04/26/2018.   Why:  Appointment with Dr. Clelia Croft MD on Tuesday, 12/17 at 9:00AM. Please bring photo ID and medicare card to this appt. Thank you.  Contact information: [address] [place] [address] [phone]          Next level of care provider has access to [facility] Link:no  Safety Planning and Suicide Prevention discussed: Yes,  SPE completed with pt; pt declined to consent to collateral contact. SPI pamphlet and mobile crisis information provided.   Have you used any form  of tobacco in the last 30 days? (Cigarettes, Smokeless Tobacco, Cigars, and/or Pipes): Yes  Has patient been referred to the Quitline?: Patient refused referral  Patient has been referred for addiction treatment: Yes  Rona Ravens, LCSW 03/22/2018, 9:00 AM

## 2018-03-22 NOTE — [facility] Group Notes (Signed)
[facility] Group Therapy 03/22/2018 1:15pm  Type of Therapy: Mental Health Association Presentation  Participation Level: Active  Participation Quality: Attentive  Affect: Appropriate  Cognitive: Oriented  Insight: Developing/Improving  Engagement in Therapy: Engaged  Modes of Intervention: Discussion, Education and Socialization  Summary of Progress/Problems: Mental Health Association (MHA) Speaker came to talk about his personal journey with mental health. The pt processed ways by which to relate to the speaker. MHA speaker provided handouts and educational information pertaining to groups and services offered by the [facility]. Pt was engaged in speaker's presentation and was receptive to resources provided.    Rona RavensHeather S Khadejah Son, LCSW 03/22/2018 2:13 PM

## 2018-03-22 NOTE — Progress Notes (Signed)
Recreation Therapy Notes  Animal-Assisted Activity (AAA) Program Checklist/Progress Notes Patient Eligibility Criteria Checklist & Daily Group note for Rec Tx Intervention  Date: 11.12.19 Time: 1430 Location: [address]   AAA/T Program Assumption of Risk Form signed by Engineer, production or Parent Legal Guardian YES   Patient is free of allergies or sever asthma  YES   Patient reports no fear of animals  YES  Patient reports no history of cruelty to animals YES   Patient understands his/her participation is voluntary YES      Patient washes hands before animal contact  YES   Patient washes hands after animal contact YES   Behavioral Response: Engaged  Education: Charity fundraiser, Appropriate Animal Interaction   Education Outcome: Acknowledges understanding/In group clarification offered/Needs additional education.   Clinical Observations/Feedback: Pt attended and participated in activity.     Caroll Rancher, LRT/CTRS    Caroll Rancher A 03/22/2018 3:13 PM

## 2018-03-23 NOTE — Progress Notes (Signed)
Pt was discharged to be picked up by a cab and [facility] for evaluation of need for further treatment    Pt denies suicidal and homicidal ideation   Pt received all belongings and follow up information and prescriptions   Pt expressed thanks for all done for her

## 2018-04-04 ENCOUNTER — Ambulatory Visit: Payer: Medicare Other | Admitting: Pharmacy Technician

## 2018-04-26 ENCOUNTER — Ambulatory Visit: Payer: Self-pay | Admitting: Family Medicine

## 2018-05-06 ENCOUNTER — Telehealth: Payer: Self-pay | Admitting: Family Medicine

## 2018-05-06 NOTE — Telephone Encounter (Signed)
Copied from CRM [id number]. Topic: General - Other >> May 06, 2018 11:01 AM Arlyss Gandyichardson, Taren N, NT wrote: Reason for CRM: Pt No Showed her new pt appt due to being in the hospital with Dr. Clelia CroftShaw. Per OneNote Dr. Clelia CroftShaw is no longer taking new pts. Pt would like to see who she can establish with to be able to get her hormone treatments. Please advise pt.

## 2018-05-06 NOTE — Telephone Encounter (Signed)
Copied from CRM #[id number]. Topic: General - Other °>> May 06, 2018 11:01 AM Richardson, Taren N, NT wrote: °Reason for CRM: Pt No Showed her new pt appt due to being in the hospital with Dr. Shaw. Per OneNote Dr. Shaw is no longer taking new pts. Pt would like to see who she can establish with to be able to get her hormone treatments. Please advise pt. °

## 2018-05-16 ENCOUNTER — Ambulatory Visit: Payer: Self-pay

## 2018-05-19 IMAGING — US US ABDOMEN COMPLETE W/ ELASTOGRAPHY
1 series · 13 of 13 positions shown · non-contrast
Comparison: None.

CLINICAL DATA: Hepatitis C x2 years



[Series 1: us abdomen complete w/ elastography · 0.17mm/px · 13 of 13 slices shown]
[im 1/13]
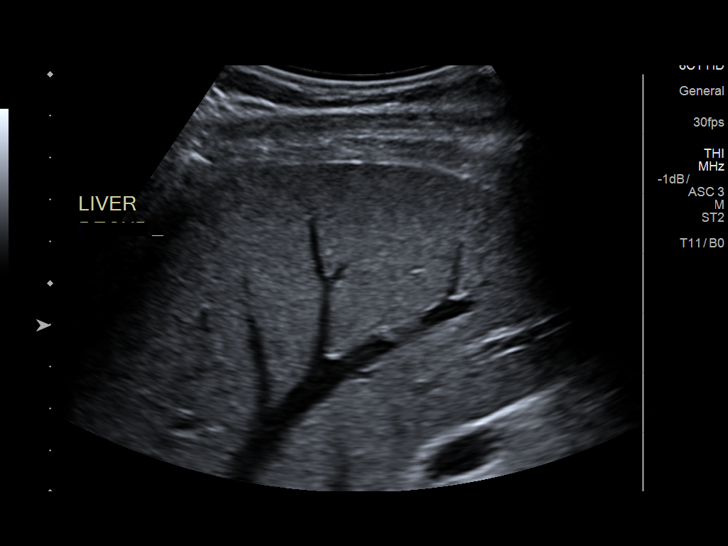
[im 2/13]
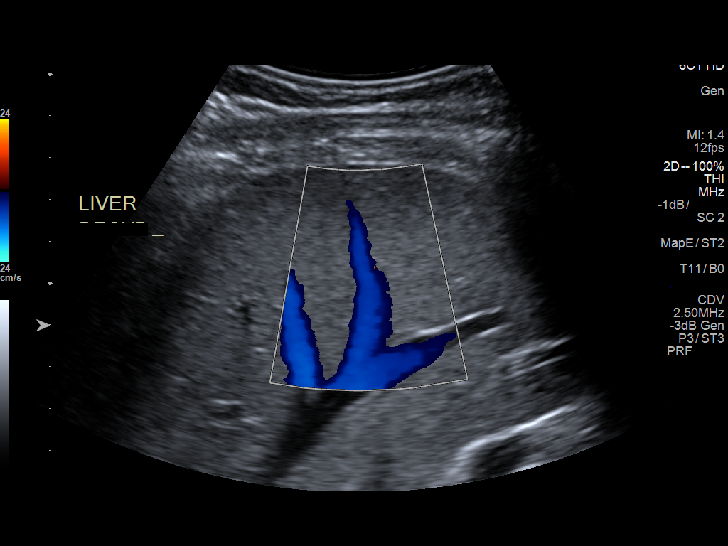
[im 3/13]
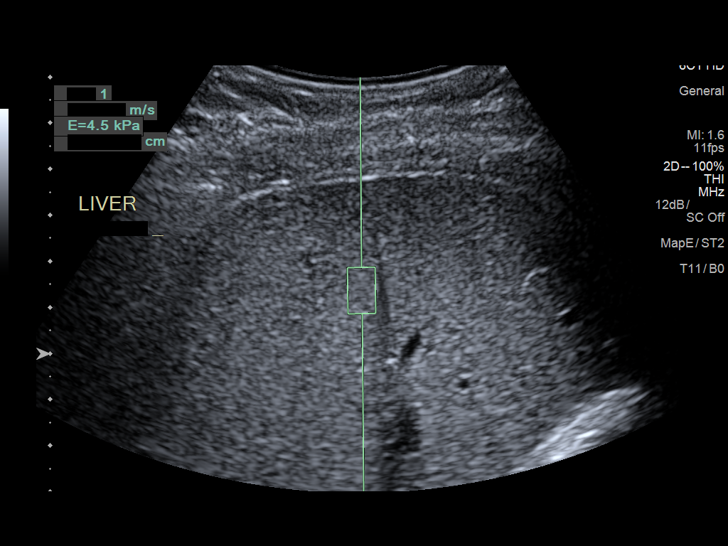
[im 4/13]
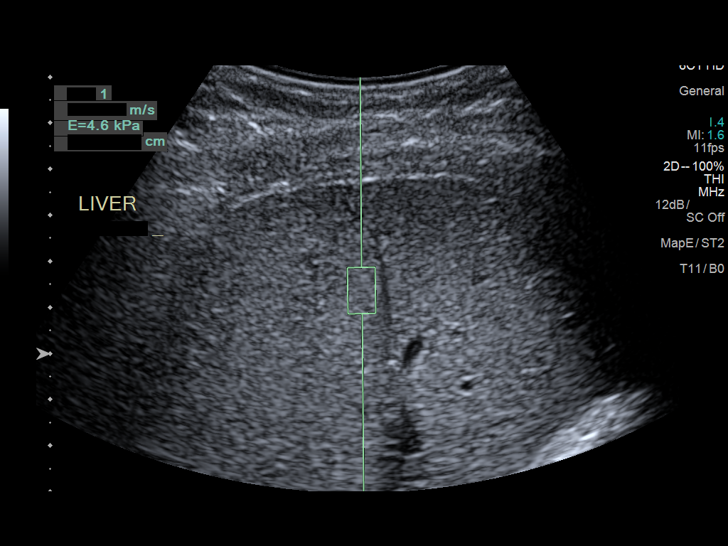
[im 5/13]
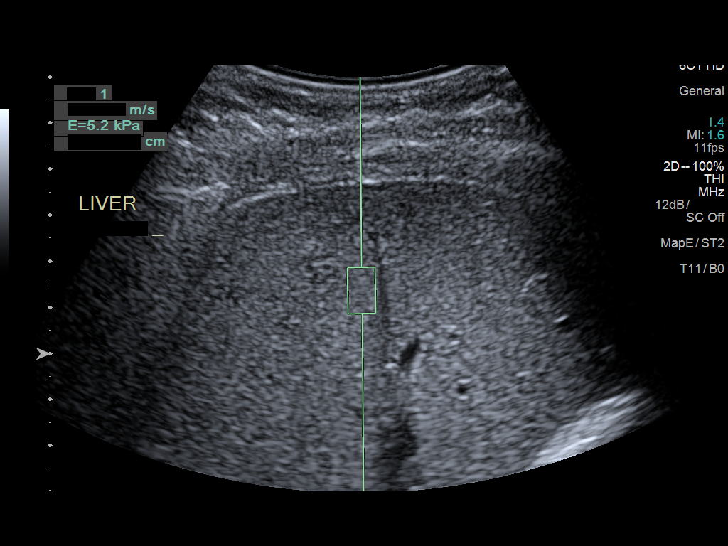
[im 6/13]
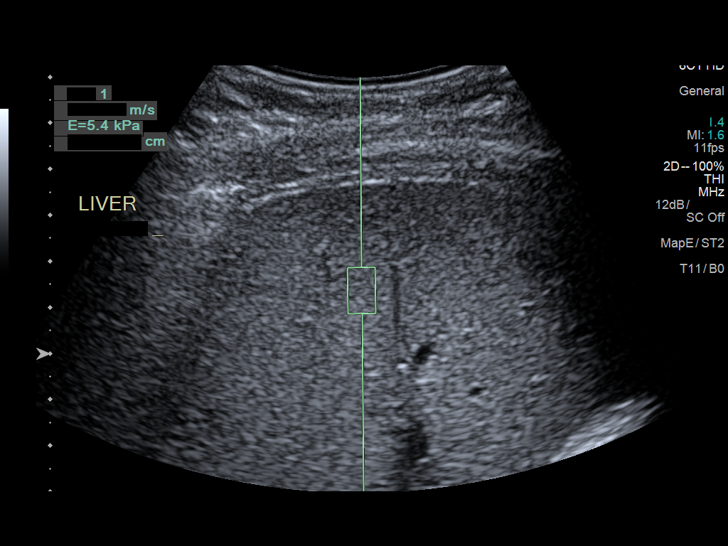
[im 7/13]
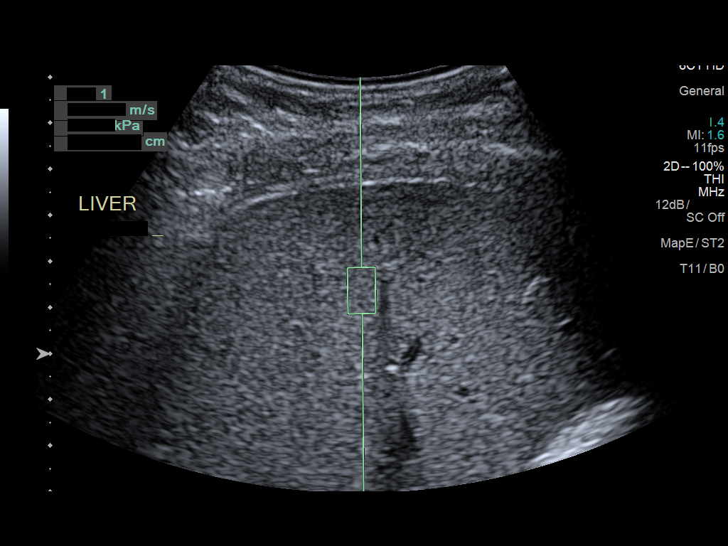
[im 8/13]
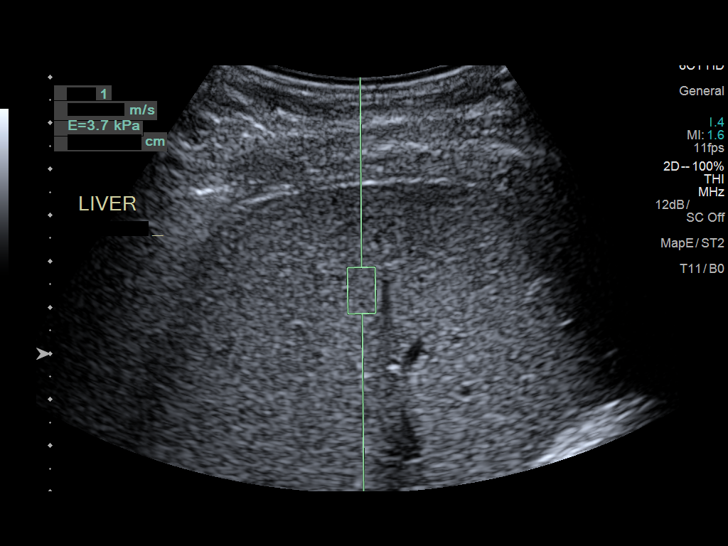
[im 9/13]
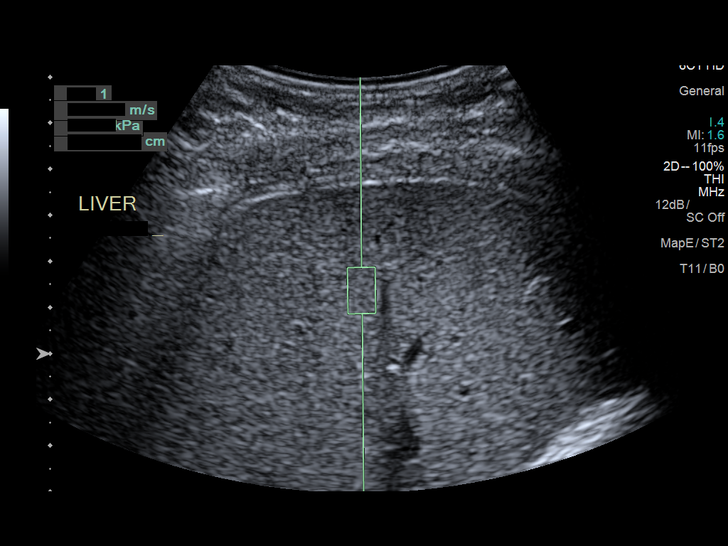
[im 10/13]
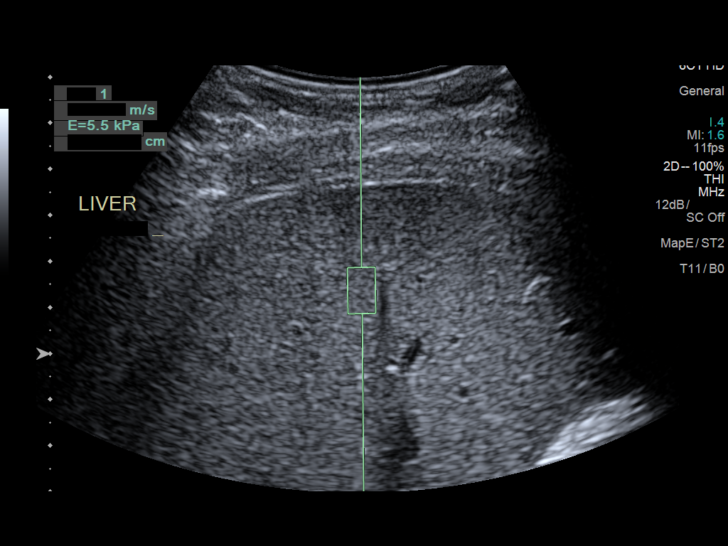
[im 11/13]
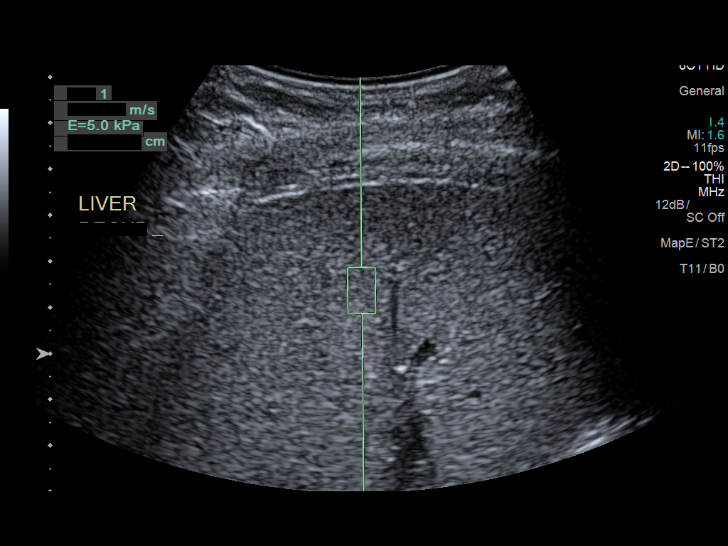
[im 12/13]
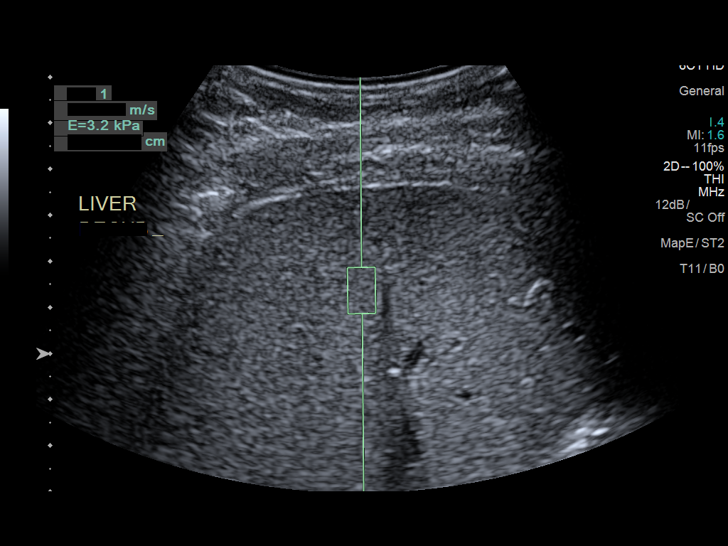
[im 13/13]
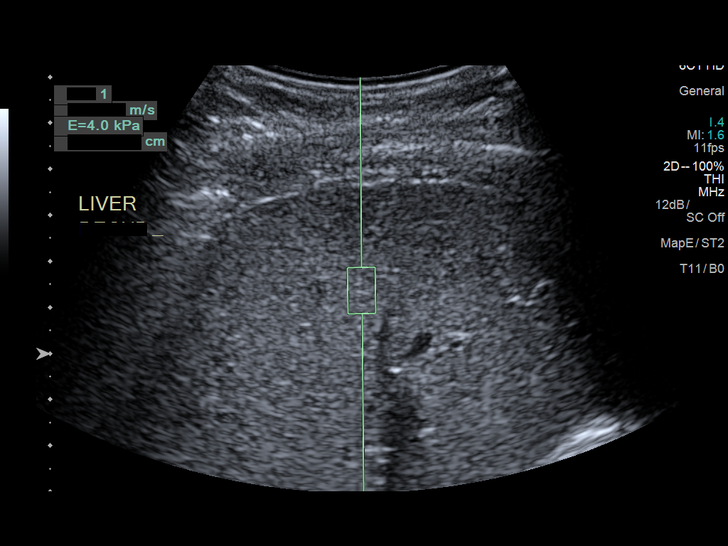

[13 of 13 positions shown; findings below may reference images not displayed]

FINDINGS: ULTRASOUND ABDOMEN

Gallbladder: No gallstones, gallbladder wall thickening, or
pericholecystic fluid.

Common bile duct: Diameter: 4 mm

Liver: No focal lesion identified. Within normal limits in
parenchymal echogenicity. Portal vein is patent on color Doppler
imaging with normal direction of blood flow towards the liver.

IVC: No abnormality visualized.

Pancreas: Visualized portion unremarkable.

Spleen: Size and appearance within normal limits.

Right Kidney: Length: 12.9 cm. Echogenicity within normal limits. No
mass or hydronephrosis visualized.

Left Kidney: Length: 12.4 cm. Echogenicity within normal limits. No
mass or hydronephrosis visualized.

Abdominal aorta: No aneurysm visualized.

Other findings: None.

ULTRASOUND HEPATIC ELASTOGRAPHY

Device: Siemens Helix VTQ

Patient position: Left Lateral Decubitus

Transducer 6C1

Number of measurements: 10

Hepatic segment:  8

Median velocity:   1.25  m/sec

IQR:

IQR/Median velocity ratio:

Corresponding Metavir fibrosis score:  F2 + some F3

Risk of fibrosis: Moderate

Limitations of exam: None

Pertinent findings noted on other imaging exams:  None

Please note that abnormal shear wave velocities may also be
identified in clinical settings other than with hepatic fibrosis,
such as: acute hepatitis, elevated right heart and central venous
pressures including use of beta blockers, Art Caffe disease
([place]), infiltrative processes such as
mastocytosis/amyloidosis/infiltrative tumor, extrahepatic
cholestasis, in the post-prandial state, and liver transplantation.
Correlation with patient history, laboratory data, and clinical
condition recommended.
IMPRESSION: ULTRASOUND ABDOMEN:
Unremarkable abdominal ultrasound.

ULTRASOUND HEPATIC ELASTOGRAPHY:

Median hepatic shear wave velocity is calculated at 1.25 m/sec.

Corresponding Metavir fibrosis score is  F2 + some F3.

Risk of fibrosis is Moderate.

Follow-up: Additional testing appropriate

## 2018-05-24 ENCOUNTER — Telehealth: Payer: Self-pay | Admitting: General Practice

## 2018-05-24 NOTE — Telephone Encounter (Signed)
LVM for pt advising that Dr. Clelia CroftShaw is out until March and that we currently don't;t have any providers that are accepting new patients that will do the hormoe ijections for her. I advised to call [facility].

## 2018-05-26 ENCOUNTER — Ambulatory Visit: Payer: Medicare Other

## 2018-06-09 ENCOUNTER — Ambulatory Visit (INDEPENDENT_AMBULATORY_CARE_PROVIDER_SITE_OTHER): Payer: Medicare Other | Admitting: Pharmacist

## 2018-06-09 DIAGNOSIS — B182 Chronic viral hepatitis C: Secondary | ICD-10-CM

## 2018-06-09 DIAGNOSIS — Z789 Other specified health status: Secondary | ICD-10-CM

## 2018-06-09 DIAGNOSIS — F640 Transsexualism: Secondary | ICD-10-CM

## 2018-06-09 MED ORDER — SPIRONOLACTONE 50 MG PO TABS: 50.0000 mg | ORAL_TABLET | Freq: Every day | ORAL | 0 refills | Status: DC

## 2018-06-09 MED ORDER — ESTRADIOL 2 MG PO TABS: 2.0000 mg | ORAL_TABLET | Freq: Three times a day (TID) | ORAL | 1 refills | Status: DC

## 2018-06-09 MED ORDER — FINASTERIDE 5 MG PO TABS: 2.5000 mg | ORAL_TABLET | Freq: Every day | ORAL | 0 refills | Status: DC

## 2018-06-09 NOTE — Progress Notes (Signed)
HPI: Randy [name] is a 48 y.o. adult who presents to the [facility] pharmacy clinic for follow-up of Hepatitis C infection.  Medication: Harvoni x12 weeks  Start Date: 02/09/2018  Hepatitis C Genotype: 1a  Fibrosis Score: F2/F3  Hepatitis C RNA: 7.72 million on 04/15/2017, 8/26 million on 12/22/2017, undetectable on 03/09/2018  Patient Active Problem List   Diagnosis Date Noted  . Severe recurrent major depression without psychotic features (HCC) 11/16/2017  . Liver fibrosis 06/22/2017  . MDD (major depressive disorder), single episode, severe , no psychosis (HCC) 06/11/2017  . Cough 03/14/2015  . Major depressive disorder, recurrent, severe without psychotic features (HCC)   . Opioid dependence with opioid-induced mood disorder (HCC)   . Cocaine dependence with cocaine-induced mood disorder (HCC)   . MDD (major depressive disorder) 09/17/2014  . MDD (major depressive disorder), recurrent severe, without psychosis (HCC) 07/05/2014  . Major depression, recurrent (HCC) 04/10/2012  . Cocaine dependence (HCC) 04/10/2012  . Polysubstance abuse including IVDA (heroin), cocaine, marijuana 01/10/2012  . Elevated LFTs 01/10/2012  . Chronic hepatitis C without hepatic coma (HCC) 01/10/2012  . 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 Tobacco abuse 01/10/2012  . Anxiety disorder 08/14/2011  . Recurrent major depression-severe (HCC) 08/13/2011    Patient's Medications  New Prescriptions   No medications on file  Previous Medications   ARIPIPRAZOLE (ABILIFY) 5 MG TABLET    Take 1 tablet (5 mg total) by mouth daily.   ESCITALOPRAM (LEXAPRO) 20 MG TABLET    Take 1 tablet (20 mg total) by mouth daily.   ESTRADIOL (ESTRACE) 2 MG TABLET    Take 1 tablet (2 mg total) by mouth 3 (three) times daily.   FINASTERIDE (PROSCAR) 5 MG TABLET    Take 0.5 tablets (2.5 mg total) by mouth daily.   LEDIPASVIR-SOFOSBUVIR (HARVONI) 90-400 MG TABS    Take 1 tablet by mouth daily.   MIRTAZAPINE  (REMERON) 7.5 MG TABLET    Take 1 tablet (7.5 mg total) by mouth at bedtime.   NICOTINE (NICODERM CQ - DOSED IN MG/24 HOURS) 21 MG/24HR PATCH    Place 1 patch (21 mg total) onto the skin daily.   PRAZOSIN (MINIPRESS) 1 MG CAPSULE    Take 1 capsule (1 mg total) by mouth at bedtime.   SPIRONOLACTONE (ALDACTONE) 50 MG TABLET    Take 1 tablet (50 mg total) by mouth daily.  Modified Medications   No medications on file  Discontinued Medications   No medications on file    Allergies: Allergies  Allergen Reactions  . Penicillins Other (See Comments)    Has patient had a PCN reaction causing immediate rash, facial/tongue/throat swelling, SOB or lightheadedness with hypotension: no Has patient had a PCN reaction causing severe rash involving mucus membranes or skin necrosis: No Has patient had a PCN reaction that required hospitalization: No Has patient had a PCN reaction occurring within the last 10 years: Yes If all of the above answers are "NO", then may proceed with Cephalosporin use.  Swollen Joints    Past Medical History: Past Medical History:  Diagnosis Date  . Cellulitis  Of  Left Forearm 01/10/2012   From IVDA  . Depression   . Hepatitis C   . 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 Marital status: Single    Spouse name: Not on file  . Number of children: Not on file  . Years of education: Not on  file  . Highest education level: Not on file  Occupational History  . 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 Financial resource strain: Very hard  . Food insecurity:    Worry: Often true    Inability: Often true  . Transportation needs:    Medical: Yes    Non-medical: Yes  Tobacco Use  . Smoking status: Current Every Day Smoker    Packs/day: 0.50    Years: 20.00    Pack years: 10.00    Types: Cigarettes  . Smokeless tobacco: Never Used  . Tobacco comment: trying to  quit-using e-cig some  Substance and Sexual Activity  . Alcohol use: Yes    Comment: occ  . Drug use: Yes    Types: Heroin, "Crack" cocaine, Marijuana    Comment: heroin last used 07/14/14, crack/ had some a few days ago  . Sexual activity: Yes    Partners: Male    Birth control/protection: Condom    Comment: Has been HIV tested in past and has been negative.  Lifestyle  . Physical activity:    Days per week: Patient refused    Minutes per session: Patient refused  . Stress: Rather much  Relationships  . Social connections:    Talks on phone: Patient refused    Gets together: Patient refused    Attends religious service: Patient refused    Active member of club or organization: Patient refused    Attends meetings of clubs or organizations: Patient refused    Relationship status: Patient refused  Other Topics Concern  . Not on file  Social History Narrative   Transgendered male.  Single.  Lives alone.  Independent.    Labs: Hepatitis C Lab Results  Component Value Date   HCVGENOTYPE 1a 04/15/2017   HCVRNAPCRQN <15 NOT DETECTED 03/09/2018   HCVRNAPCRQN 8,260,000 (H) 12/22/2017   HCVRNAPCRQN 7,720,000 (H) 04/15/2017   FIBROSTAGE F1-F2 04/15/2017   Hepatitis B Lab Results  Component Value Date   HEPBSAB BORDERLINE (A) 04/15/2017   HEPBSAG NON-REACTIVE 04/15/2017   HEPBCAB NON-REACTIVE 04/15/2017   Hepatitis A Lab Results  Component Value Date   HAV REACTIVE (A) 04/15/2017   HIV Lab Results  Component Value Date   HIV NON-REACTIVE 12/22/2017   HIV NON-REACTIVE 04/15/2017   HIV NONREACTIVE 10/20/2013   HIV NON REACTIVE 06/29/2012   HIV NON REACTIVE 01/10/2012   Lab Results  Component Value Date   CREATININE 1.11 03/12/2018   CREATININE 1.25 03/09/2018   CREATININE 1.21 12/22/2017   CREATININE 1.10 11/16/2017   CREATININE 0.95 06/12/2017   Lab Results  Component Value Date   AST 19 03/12/2018   AST 24 03/09/2018   AST 56 (H) 12/22/2017   ALT 14  03/12/2018   ALT 11 03/09/2018   ALT 90 (H) 12/22/2017   INR 1.0 04/15/2017   INR 0.91 11/13/2013   INR 1.09 01/13/2012    Assessment: Chess is here today for her end of therapy visit for  her Hepatitis C after completing 12 weeks of Harvoni. She reports that although she did experience some headaches at the beginning of therapy, these headaches resolved after the first few weeks. She does report that her adherence with Harvoni was not so good towards the end of therapy. She states that she did miss 1-2 doses and has 2-3 doses remaining. It has been a few weeks since her last dose of Harvoni. Her last HCV RNA in October 2019 was undetectable, but will  order HCV RNA today to ensure it remains undetectable. Explained that we will need to see her back one more time for a cure visit in three months to ensure she has remained undetectable.  Additionally, Jullian has requested one time prescriptions for her hormone therapy to hold her over until her primary care appointment on 2/11. Provided her with 30 day supplies of finasteride, estradiol, and spironolactone.   Plan: -HIV RNA -Follow-up in 3 months for cure visit on 4/14 at 3:15pm  Lawernce Keas [employer] [facility] 06/09/2018, 3:52 PM

## 2018-06-13 LAB — HEPATITIS C RNA QUANTITATIVE
HCV QUANT LOG: NOT DETECTED {Log_IU}/mL
HCV RNA, PCR, QN: NOT DETECTED [IU]/mL

## 2018-06-21 ENCOUNTER — Encounter: Payer: Self-pay | Admitting: Endocrinology

## 2018-06-21 ENCOUNTER — Ambulatory Visit (INDEPENDENT_AMBULATORY_CARE_PROVIDER_SITE_OTHER): Payer: Medicare Other | Admitting: Endocrinology

## 2018-06-21 DIAGNOSIS — F640 Transsexualism: Secondary | ICD-10-CM

## 2018-06-21 DIAGNOSIS — Z789 Other specified health status: Secondary | ICD-10-CM | POA: Insufficient documentation

## 2018-06-21 LAB — BASIC METABOLIC PANEL
BUN: 16 mg/dL (ref 6–23)
CHLORIDE: 103 meq/L (ref 96–112)
CO2: 25 mEq/L (ref 19–32)
CREATININE: 1.11 mg/dL (ref 0.40–1.50)
Calcium: 9.3 mg/dL (ref 8.4–10.5)
GFR: 71.04 mL/min (ref >60.00)
Glucose, Bld: 86 mg/dL (ref 70–99)
Potassium: 4 mEq/L (ref 3.5–5.1)
Sodium: 136 mEq/L (ref 135–145)

## 2018-06-21 NOTE — Patient Instructions (Addendum)
Blood tests are requested for you today.  We'll let you know about the results.  estrogen treatment has risks, including blood clots, liver problems, lower hdl ("good cholesterol"), polycythemia (opposite of anemia), sleep apnea, and behavior changes.   In view of your polycythemia, it is very important that you quit smoking.  Here is a list of primary care practices.   Please see a surgery specialist.  you will receive a phone call, about a day and time for an appointment.   Please come back for a follow-up appointment in 1 year.

## 2018-06-21 NOTE — Progress Notes (Signed)
Subjective:    Patient ID: Randy [name], adult    DOB: [birth year], 48 y.o.   MRN: [id number]  HPI Pt is referred by for transgender state (M to F).  Pt reports she first suspected the transgender state at age 89.  She had puberty at the age of 48.  She has never had surgery for this.  She last went to counseling approx 1 year ago.  She has been on E2, finasteride, and aldactone since 2003.  [facility].  She has no children.  She has no h/o DVT, dyslipidemia, liver disease, kidney disease, heart disease, CVA, diabetes, gallbladder disease, blood disorders, osteoporosis, or cancer.  She is a smoker.  She has h/o hep C.  She has moderate breast growth, but no assoc reduction in penile size.   Past Medical History:  Diagnosis Date  . Cellulitis  Of  Left Forearm 01/10/2012   From IVDA  . Depression   . Hepatitis C   . 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 I&D EXTREMITY  01/13/2012   Procedure: IRRIGATION AND DEBRIDEMENT EXTREMITY;  Surgeon: Kennieth RadArthur F Carter, MD;  Location: [facility];  Service: Orthopedics;  Laterality: Left;    Social History   Socioeconomic History  . Marital status: Single    Spouse name: Not on file  . Number of children: Not on file  . Years of education: Not on file  . Highest education level: Not on file  Occupational History  . 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 Financial resource strain: Very hard  . Food insecurity:    Worry: Often true    Inability: Often true  . Transportation needs:    Medical: Yes    Non-medical: Yes  Tobacco Use  . Smoking status: Current Every Day Smoker    Packs/day: 0.50    Years: 20.00    Pack years: 10.00    Types: Cigarettes  . Smokeless tobacco: Never Used  . Tobacco comment: trying to quit-using e-cig some  Substance and Sexual Activity  . Alcohol use: Yes    Comment: occ  . Drug use: Yes    Types:  Heroin, "Crack" cocaine, Marijuana    Comment: heroin last used 07/14/14, crack/ had some a few days ago  . Sexual activity: Yes    Partners: Male    Birth control/protection: Condom    Comment: Has been HIV tested in past and has been negative.  Lifestyle  . Physical activity:    Days per week: Patient refused    Minutes per session: Patient refused  . Stress: Rather much  Relationships  . Social connections:    Talks on phone: Patient refused    Gets together: Patient refused    Attends religious service: Patient refused    Active member of club or organization: Patient refused    Attends meetings of clubs or organizations: Patient refused    Relationship status: Patient refused  . Intimate partner violence:    Fear of current or ex partner: Patient refused    Emotionally abused: Patient refused    Physically abused: Patient refused    Forced sexual activity: Patient refused  Other Topics Concern  . Not on file  Social History Narrative   Transgendered male.  Single.  Lives alone.  Independent.    Current Outpatient Medications on File Prior to Visit  Medication Sig Dispense Refill  .  ARIPiprazole (ABILIFY) 5 MG tablet Take 1 tablet (5 mg total) by mouth daily. 30 tablet 0  . escitalopram (LEXAPRO) 20 MG tablet Take 1 tablet (20 mg total) by mouth daily. 30 tablet 0  . estradiol (ESTRACE) 2 MG tablet Take 1 tablet (2 mg total) by mouth 3 (three) times daily. 45 tablet 1  . finasteride (PROSCAR) 5 MG tablet Take 0.5 tablets (2.5 mg total) by mouth daily. 15 tablet 0  . Ledipasvir-Sofosbuvir (HARVONI) 90-400 MG TABS Take 1 tablet by mouth daily. 30 tablet 0  . mirtazapine (REMERON) 7.5 MG tablet Take 1 tablet (7.5 mg total) by mouth at bedtime. 30 tablet 0  . nicotine (NICODERM CQ - DOSED IN MG/24 HOURS) 21 mg/24hr patch Place 1 patch (21 mg total) onto the skin daily. 28 patch 0  . prazosin (MINIPRESS) 1 MG capsule Take 1 capsule (1 mg total) by mouth at bedtime. 30 capsule 0    . spironolactone (ALDACTONE) 50 MG tablet Take 1 tablet (50 mg total) by mouth daily. 30 tablet 0   No current facility-administered medications on file prior to visit.     Allergies  Allergen Reactions  . Penicillins Other (See Comments)    Has patient had a PCN reaction causing immediate rash, facial/tongue/throat swelling, SOB or lightheadedness with hypotension: no Has patient had a PCN reaction causing severe rash involving mucus membranes or skin necrosis: No Has patient had a PCN reaction that required hospitalization: No Has patient had a PCN reaction occurring within the last 10 years: Yes If all of the above answers are "NO", then may proceed with Cephalosporin use.  Swollen Joints    Family History  Problem Relation Age of Onset  . Idiopathic pulmonary fibrosis Father   . Endocrine Disorder Neg Hx     BP 122/76 (BP Location: Right Arm)   Pulse 82   Resp 18   Ht 5\' 8"  (1.727 m)   Wt 208 lb 9.6 oz (94.6 kg)   SpO2 96%   BMI 31.72 kg/m   Review of Systems Denies headache, visual loss, chest pain, sob, nausea, hematuria, acne, easy bruising, seizure, edema, myalgias, heat intolerance, rhinorrhea, and excessive diaphoresis. She has gained a few lbs.  She has mild anxiety.      Objective:   Physical Exam VS: see vs page GEN: no distress HEAD: head: no deformity eyes: no periorbital swelling, no proptosis external nose and ears are normal mouth: no lesion seen NECK: supple, thyroid is not enlarged CHEST WALL: no deformity LUNGS:  Clear to auscultation BREASTS: declined CV: reg rate and rhythm, no murmur ABD: abdomen is soft, nontender.  no hepatosplenomegaly.  not distended.  no hernia GENITALIA: declined MUSCULOSKELETAL: muscle bulk and strength are grossly normal.  no obvious joint swelling.  gait is normal and steady EXTEMITIES: no deformity.  no ulcer on the feet.  feet are of normal color and temp.  no edema PULSES: dorsalis pedis intact bilat.  no  carotid bruit NEURO:  cn 2-12 grossly intact.   readily moves all 4's.  sensation is intact to touch on the feet SKIN:  Normal texture and temperature.  No rash or suspicious lesion is visible.  Moderate terminal hair on the face  NODES:  None palpable at the neck PSYCH: alert, well-oriented.  Does not appear anxious nor depressed.    Lab Results  Component Value Date   TSH 1.040 03/14/2018   I have reviewed outside records, and summarized: Pt was noted to have transgender  state, and referred here.  She is seeing BH for ongoing depression and substance use problems     Assessment & Plan:  Transgender state, new to me.  She requests ref surgery.  Smoker: we discussed the fact that this vastly increases the risk of E2 rx.   Polycythemia: poss due to smoking, but this also increases the risk of E2 rx.   Patient Instructions  Blood tests are requested for you today.  We'll let you know about the results.  estrogen treatment has risks, including blood clots, liver problems, lower hdl ("good cholesterol"), polycythemia (opposite of anemia), sleep apnea, and behavior changes.   In view of your polycythemia, it is very important that you quit smoking.  Here is a list of primary care practices.   Please see a surgery specialist.  you will receive a phone call, about a day and time for an appointment.   Please come back for a follow-up appointment in 1 year.

## 2018-06-26 ENCOUNTER — Inpatient Hospital Stay (HOSPITAL_COMMUNITY)
Admission: RE | Admit: 2018-06-26 | Discharge: 2018-07-04 | DRG: 885 | Disposition: A | Discharge status: Home / Self Care | Payer: Medicare Other | Attending: Psychiatry | Admitting: Psychiatry

## 2018-06-26 ENCOUNTER — Encounter (HOSPITAL_COMMUNITY): Payer: Self-pay

## 2018-06-26 ENCOUNTER — Other Ambulatory Visit: Payer: Self-pay

## 2018-06-26 DIAGNOSIS — F17210 Nicotine dependence, cigarettes, uncomplicated: Secondary | ICD-10-CM | POA: Diagnosis present

## 2018-06-26 DIAGNOSIS — E349 Endocrine disorder, unspecified: Secondary | ICD-10-CM | POA: Diagnosis present

## 2018-06-26 DIAGNOSIS — F1124 Opioid dependence with opioid-induced mood disorder: Secondary | ICD-10-CM | POA: Diagnosis present

## 2018-06-26 DIAGNOSIS — B182 Chronic viral hepatitis C: Secondary | ICD-10-CM | POA: Diagnosis present

## 2018-06-26 DIAGNOSIS — Z79899 Other long term (current) drug therapy: Secondary | ICD-10-CM

## 2018-06-26 DIAGNOSIS — F419 Anxiety disorder, unspecified: Secondary | ICD-10-CM | POA: Diagnosis present

## 2018-06-26 DIAGNOSIS — F332 Major depressive disorder, recurrent severe without psychotic features: Principal | ICD-10-CM | POA: Diagnosis present

## 2018-06-26 DIAGNOSIS — F640 Transsexualism: Secondary | ICD-10-CM | POA: Diagnosis present

## 2018-06-26 DIAGNOSIS — R45851 Suicidal ideations: Secondary | ICD-10-CM | POA: Diagnosis present

## 2018-06-26 DIAGNOSIS — F1424 Cocaine dependence with cocaine-induced mood disorder: Secondary | ICD-10-CM | POA: Diagnosis present

## 2018-06-26 DIAGNOSIS — F17290 Nicotine dependence, other tobacco product, uncomplicated: Secondary | ICD-10-CM | POA: Diagnosis present

## 2018-06-26 DIAGNOSIS — F649 Gender identity disorder, unspecified: Secondary | ICD-10-CM | POA: Diagnosis present

## 2018-06-26 DIAGNOSIS — Z789 Other specified health status: Secondary | ICD-10-CM

## 2018-06-26 DIAGNOSIS — F322 Major depressive disorder, single episode, severe without psychotic features: Secondary | ICD-10-CM | POA: Diagnosis not present

## 2018-06-26 DIAGNOSIS — F4310 Post-traumatic stress disorder, unspecified: Secondary | ICD-10-CM | POA: Diagnosis present

## 2018-06-26 MED ORDER — ARIPIPRAZOLE 5 MG PO TABS
5.0000 mg | ORAL_TABLET | Freq: Every day | ORAL | Status: DC
  Administered 2018-06-26 – 2018-07-04 (×9): 5 mg via ORAL
  Filled 2018-06-26 (×12): qty 1

## 2018-06-26 MED ORDER — ESTRADIOL 2 MG PO TABS
2.0000 mg | ORAL_TABLET | Freq: Three times a day (TID) | ORAL | Status: DC
  Administered 2018-06-26 – 2018-07-02 (×18): 2 mg via ORAL
  Administered 2018-07-02: 1 mg via ORAL
  Administered 2018-07-03 – 2018-07-04 (×5): 2 mg via ORAL
  Filled 2018-06-26: qty 2
  Filled 2018-06-26 (×11): qty 1
  Filled 2018-06-26: qty 2
  Filled 2018-06-26 (×14): qty 1
  Filled 2018-06-26: qty 2
  Filled 2018-06-26: qty 1

## 2018-06-26 MED ORDER — ACETAMINOPHEN 325 MG PO TABS: 650.0000 mg | ORAL_TABLET | Freq: Four times a day (QID) | ORAL | Status: DC | PRN

## 2018-06-26 MED ORDER — MAGNESIUM HYDROXIDE 400 MG/5ML PO SUSP: 30.0000 mL | Freq: Every day | ORAL | Status: DC | PRN

## 2018-06-26 MED ORDER — PRAZOSIN HCL 1 MG PO CAPS
1.0000 mg | ORAL_CAPSULE | Freq: Every day | ORAL | Status: DC
  Administered 2018-06-26 – 2018-07-03 (×8): 1 mg via ORAL
  Filled 2018-06-26 (×11): qty 1

## 2018-06-26 MED ORDER — TRAZODONE HCL 50 MG PO TABS: 50.0000 mg | ORAL_TABLET | Freq: Every evening | ORAL | Status: DC | PRN

## 2018-06-26 MED ORDER — MIRTAZAPINE 7.5 MG PO TABS
7.5000 mg | ORAL_TABLET | Freq: Every day | ORAL | Status: DC
  Administered 2018-06-26: 7.5 mg via ORAL
  Filled 2018-06-26 (×3): qty 1

## 2018-06-26 MED ORDER — HYDROXYZINE HCL 25 MG PO TABS
25.0000 mg | ORAL_TABLET | Freq: Three times a day (TID) | ORAL | Status: DC | PRN
  Filled 2018-06-26: qty 1

## 2018-06-26 MED ORDER — FINASTERIDE 5 MG PO TABS
2.5000 mg | ORAL_TABLET | Freq: Every day | ORAL | Status: DC
  Administered 2018-06-27 – 2018-07-04 (×8): 2.5 mg via ORAL
  Filled 2018-06-26 (×11): qty 0.5

## 2018-06-26 MED ORDER — ALUM & MAG HYDROXIDE-SIMETH 200-200-20 MG/5ML PO SUSP: 30.0000 mL | ORAL | Status: DC | PRN

## 2018-06-26 MED ORDER — SPIRONOLACTONE 50 MG PO TABS
50.0000 mg | ORAL_TABLET | Freq: Every day | ORAL | Status: DC
  Administered 2018-06-27 – 2018-07-04 (×8): 50 mg via ORAL
  Filled 2018-06-26 (×7): qty 1
  Filled 2018-06-26: qty 2
  Filled 2018-06-26 (×2): qty 1

## 2018-06-26 MED ORDER — NICOTINE 21 MG/24HR TD PT24
21.0000 mg | MEDICATED_PATCH | Freq: Every day | TRANSDERMAL | Status: DC
  Administered 2018-06-27 – 2018-07-04 (×6): 21 mg via TRANSDERMAL
  Filled 2018-06-26 (×11): qty 1

## 2018-06-26 MED ORDER — ESCITALOPRAM OXALATE 20 MG PO TABS
20.0000 mg | ORAL_TABLET | Freq: Every day | ORAL | Status: DC
  Administered 2018-06-26 – 2018-07-04 (×9): 20 mg via ORAL
  Filled 2018-06-26 (×9): qty 1
  Filled 2018-06-26: qty 2
  Filled 2018-06-26: qty 1
  Filled 2018-06-26: qty 2

## 2018-06-26 NOTE — Progress Notes (Signed)
D: Pt was in bed in her room upon initial approach.  Pt presents with depressed affect and mood.  Pt denies having a goal tonight.  Writer and pt made goal for pt to be safe and sleep well.  Pt denies SI/HI, denies hallucinations, denies pain.  Pt has been visible in milieu intermittently with few peer interactions.  Pt attended evening group.    A: Introduced self to pt.  Actively listened to pt and offered support and encouragement.  Medications administered per order.  Q15 minute safety checks maintained.  R: Pt is safe on the unit.  Pt is compliant with medications.  Pt verbally contracts for safety.  Will continue to monitor and assess.

## 2018-06-26 NOTE — Progress Notes (Signed)
Patient ID: Liberty Netherland, adult   DOB: Oct 11, 1970, 48 y.o.   MRN: [id number] Per State regulations 482.30 this chart was reviewed for medical necessity with respect to the patient's admission/duration of stay.    Next review date: 06/30/2018  Thurman Coyer, BSN, RN-BC  Case Manager
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
## 2018-06-26 NOTE — Tx Team (Addendum)
Initial Treatment Plan 06/26/2018 8:30 PM Mavric Slavey [id number]    PATIENT STRESSORS: Substance abuse Other: Depression   PATIENT STRENGTHS: Ability for insight Communication skills Motivation for treatment/growth Physical Health   PATIENT IDENTIFIED PROBLEMS:     "my life's a mess"    "living situation stressful"    " need grief counseling"         DISCHARGE CRITERIA:  Adequate post-discharge living arrangements Improved stabilization in mood, thinking, and/or behavior Reduction of life-threatening or endangering symptoms to within safe limits  PRELIMINARY DISCHARGE PLAN: Outpatient therapy Placement in alternative living arrangements  PATIENT/FAMILY INVOLVEMENT: This treatment plan has been presented to and reviewed with the patient, Randy [name], The patient has been given the opportunity to ask questions and make suggestions.  Shela Nevin, RN 06/26/2018, 8:30 PM

## 2018-06-26 NOTE — BH Assessment (Signed)
Assessment Note  Randy [name] is an 48 y.o. adult. Pt reports SI with no plan. Pt states she cannot contract for safety. Pt denies HI and AVH. Per Pt she's depressed due to her living situation and strained relationship with her roommate. Per Pt her roommate is "mean" to her. Pt reports an increase in her depression due to the roommate and relapse on drugs. Pt admits to heroin and cocaine use. Pt states she [facility]. Pt states she feels she needs a therapist at this time. Per has multiple inpatient hospitalizations since 2014. Pt denies family support.  Feliz Beamravis, NP recommends inpatient treatment.   Diagnosis: F33.2 MDD  Past Medical History:  Past Medical History:  Diagnosis Date  . Cellulitis  Of  Left Forearm 01/10/2012   From IVDA  . Depression   . Hepatitis C   . 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 I&D EXTREMITY  01/13/2012   Procedure: IRRIGATION AND DEBRIDEMENT EXTREMITY;  Surgeon: Kennieth RadArthur F Carter, MD;  Location: [facility];  Service: Orthopedics;  Laterality: Left;    Family History:  Family History  Problem Relation Age of Onset  . Idiopathic pulmonary fibrosis Father   . Endocrine Disorder Neg Hx     Social History:  reports that she has been smoking cigarettes. She has a 10.00 pack-year smoking history. She has never used smokeless tobacco. She reports current alcohol use. She reports current drug use. Drugs: Heroin, "Crack" cocaine, and Marijuana.  Additional Social History:  Alcohol / Drug Use Pain Medications: please see mar Prescriptions: please see mar Over the Counter: please see mar History of alcohol / drug use?: Yes Longest period of sobriety (when/how long): unknown Substance #1 Name of Substance 1: cocaine 1 - Age of First Use: unknown 1 - Amount (size/oz): unknown 1 - Frequency: 1x a week 1 - Duration: ongoing 1 - Last Use / Amount: 2 days  ago Substance #2 Name of Substance 2: heroin 2 - Age of First Use: unknown 2 - Amount (size/oz): unknown 2 - Frequency: 1x a week 2 - Duration: ongoing 2 - Last Use / Amount: 2 days ago  CIWA: CIWA-Ar BP: 120/80 Pulse Rate: 77 COWS:    Allergies:  Allergies  Allergen Reactions  . Penicillins Other (See Comments)    Has patient had a PCN reaction causing immediate rash, facial/tongue/throat swelling, SOB or lightheadedness with hypotension: no Has patient had a PCN reaction causing severe rash involving mucus membranes or skin necrosis: No Has patient had a PCN reaction that required hospitalization: No Has patient had a PCN reaction occurring within the last 10 years: Yes If all of the above answers are "NO", then may proceed with Cephalosporin use.  Swollen Joints    Home Medications: (Not in a hospital admission)   OB/GYN Status:  No LMP recorded.  General Assessment Data [facility] Assessment: In system Is this a Tele or Face-to-Face Assessment?: Face-to-Face Is this an Initial Assessment or a Re-assessment for this encounter?: Initial Assessment Patient Accompanied by:: N/A Language Other than English: No Living Arrangements: Other (Comment) What gender do you identify as?: Male Marital status: Single Maiden name: NA Pregnancy Status: No Living Arrangements: Other (Comment) Can pt return to current living arrangement?: Yes Admission Status: Voluntary Is patient capable of signing voluntary admission?: Yes Referral Source: Self/Family/Friend Insurance type: Medicare  Medical Screening Exam [facility] Walk-in ONLY) Medical Exam  completed: Yes  Crisis Care Plan Living Arrangements: Other (Comment) Legal Guardian: Other:(self) Name of Psychiatrist: [facility] Name of Therapist: NA  Education Status Is patient currently in school?: No Is the patient employed, unemployed or receiving disability?: Unemployed  Risk to self  with the past 6 months Suicidal Ideation: Yes-Currently Present Has patient been a risk to self within the past 6 months prior to admission? : Yes Suicidal Intent: No-Not Currently/Within Last 6 Months Has patient had any suicidal intent within the past 6 months prior to admission? : No Is patient at risk for suicide?: Yes Suicidal Plan?: No-Not Currently/Within Last 6 Months Has patient had any suicidal plan within the past 6 months prior to admission? : Yes Access to Means: Yes Specify Access to Suicidal Means: access to pills What has been your use of drugs/alcohol within the last 12 months?: heroin and cocaine Previous Attempts/Gestures: Yes How many times?: 2 Other Self Harm Risks: NA Triggers for Past Attempts: None known Intentional Self Injurious Behavior: None Family Suicide History: No Recent stressful life event(s): Conflict (Comment) Persecutory voices/beliefs?: No Depression: Yes Depression Symptoms: Tearfulness, Isolating, Loss of interest in usual pleasures, Feeling worthless/self pity, Feeling angry/irritable Substance abuse history and/or treatment for substance abuse?: No Suicide prevention information given to non-admitted patients: Not applicable  Risk to Others within the past 6 months Homicidal Ideation: No Does patient have any lifetime risk of violence toward others beyond the six months prior to admission? : No Thoughts of Harm to Others: No Current Homicidal Intent: No Current Homicidal Plan: No Access to Homicidal Means: No Identified Victim: NA History of harm to others?: No Assessment of Violence: None Noted Violent Behavior Description: NA Does patient have access to weapons?: No Criminal Charges Pending?: No Does patient have a court date: No Is patient on probation?: No  Psychosis Hallucinations: None noted Delusions: None noted  Mental Status Report Appearance/Hygiene: Unremarkable Eye Contact: Fair Motor Activity: Freedom of  movement Speech: Logical/coherent Level of Consciousness: Alert Mood: Depressed Affect: Depressed Anxiety Level: Minimal Thought Processes: Coherent, Relevant Judgement: Unimpaired Orientation: Person, Place, Time, Situation Obsessive Compulsive Thoughts/Behaviors: None  Cognitive Functioning Concentration: Normal Memory: Recent Intact, Remote Intact Is patient IDD: No Insight: Poor Impulse Control: Poor Appetite: Fair Have you had any weight changes? : No Change Sleep: No Change Total Hours of Sleep: 8 Vegetative Symptoms: None  ADLScreening [facility] Assessment Services) Patient's cognitive ability adequate to safely complete daily activities?: Yes Patient able to express need for assistance with ADLs?: Yes Independently performs ADLs?: Yes (appropriate for developmental age)  Prior Inpatient Therapy Prior Inpatient Therapy: Yes Prior Therapy Dates: multiple since 2014 Prior Therapy Facilty/Provider(s): [facility] Reason for Treatment: SI and depression  Prior Outpatient Therapy Prior Outpatient Therapy: Yes Prior Therapy Dates: current Prior Therapy Facilty/Provider(s): [facility] Reason for Treatment: depression Does patient have an ACCT team?: No Does patient have Intensive In-House Services?  : No Does patient have [facility] services? : No Does patient have P4CC services?: No  ADL Screening (condition at time of admission) Patient's cognitive ability adequate to safely complete daily activities?: Yes Is the patient deaf or have difficulty hearing?: No Does the patient have difficulty seeing, even when wearing glasses/contacts?: No Does the patient have difficulty concentrating, remembering, or making decisions?: No Patient able to express need for assistance with ADLs?: Yes Does the patient have difficulty dressing or bathing?: No Independently performs ADLs?: Yes (appropriate for developmental age)       Abuse/Neglect Assessment (Assessment to be complete while patient  is alone) Abuse/Neglect Assessment Can Be Completed: Yes Physical Abuse: Denies Verbal Abuse: Denies Sexual Abuse: Denies Exploitation of patient/patient's resources: Denies     Advance Directives (For Healthcare) Does Patient Have a Medical Advance Directive?: No Would patient like information on creating a medical advance directive?: No - Patient declined          Disposition:  Disposition Initial Assessment Completed for this Encounter: Yes  On Site Evaluation by:   Reviewed with Physician:    Emmit Pomfret 06/26/2018 3:39 PM

## 2018-06-27 DIAGNOSIS — F332 Major depressive disorder, recurrent severe without psychotic features: Principal | ICD-10-CM

## 2018-06-27 DIAGNOSIS — F649 Gender identity disorder, unspecified: Secondary | ICD-10-CM

## 2018-06-27 LAB — CBC
HCT: 47.2 % (ref 39.0–52.0)
Hemoglobin: 15.2 g/dL (ref 13.0–17.0)
MCH: 32.3 pg (ref 26.0–34.0)
MCHC: 32.2 g/dL (ref 30.0–36.0)
MCV: 100.2 fL — ABNORMAL HIGH (ref 80.0–100.0)
Platelets: 164 10*3/uL (ref 150–400)
RBC: 4.71 MIL/uL (ref 4.22–5.81)
RDW: 13.5 % (ref 11.5–15.5)
WBC: 7.2 10*3/uL (ref 4.0–10.5)
nRBC: 0 % (ref 0.0–0.2)

## 2018-06-27 LAB — RAPID URINE DRUG SCREEN, HOSP PERFORMED
Amphetamines: NOT DETECTED
Barbiturates: NOT DETECTED
Benzodiazepines: NOT DETECTED
COCAINE: POSITIVE — AB
Opiates: POSITIVE — AB
Tetrahydrocannabinol: NOT DETECTED

## 2018-06-27 LAB — COMPREHENSIVE METABOLIC PANEL
ALT: 18 U/L (ref 0–44)
ANION GAP: 8 (ref 5–15)
AST: 29 U/L (ref 15–41)
Albumin: 4.1 g/dL (ref 3.5–5.0)
Alkaline Phosphatase: 44 U/L (ref 38–126)
BUN: 14 mg/dL (ref 6–20)
CO2: 27 mmol/L (ref 22–32)
Calcium: 9.6 mg/dL (ref 8.9–10.3)
Chloride: 102 mmol/L (ref 98–111)
Creatinine, Ser: 1.27 mg/dL — ABNORMAL HIGH (ref 0.61–1.24)
GFR calc non Af Amer: >60 mL/min (ref >60)
Glucose, Bld: 90 mg/dL (ref 70–99)
Potassium: 4.5 mmol/L (ref 3.5–5.1)
Sodium: 137 mmol/L (ref 135–145)
Total Bilirubin: 0.9 mg/dL (ref 0.3–1.2)
Total Protein: 7.6 g/dL (ref 6.5–8.1)

## 2018-06-27 LAB — TSH: TSH: 1.613 u[IU]/mL (ref 0.350–4.500)

## 2018-06-27 LAB — HEPATIC FUNCTION PANEL
ALT: 18 U/L (ref 0–44)
AST: 28 U/L (ref 15–41)
Albumin: 4.4 g/dL (ref 3.5–5.0)
Alkaline Phosphatase: 46 U/L (ref 38–126)
Bilirubin, Direct: 0.1 mg/dL (ref 0.0–0.2)
Indirect Bilirubin: 0.8 mg/dL (ref 0.3–0.9)
Total Bilirubin: 0.9 mg/dL (ref 0.3–1.2)
Total Protein: 7.8 g/dL (ref 6.5–8.1)

## 2018-06-27 LAB — LIPID PANEL
Cholesterol: 185 mg/dL (ref 0–200)
HDL: 44 mg/dL (ref >40)
LDL Cholesterol: 123 mg/dL — ABNORMAL HIGH (ref 0–99)
TRIGLYCERIDES: 89 mg/dL (ref <150)
Total CHOL/HDL Ratio: 4.2 RATIO
VLDL: 18 mg/dL (ref 0–40)

## 2018-06-27 MED ORDER — MIRTAZAPINE 15 MG PO TABS
15.0000 mg | ORAL_TABLET | Freq: Every day | ORAL | Status: DC
  Administered 2018-06-27 – 2018-07-03 (×7): 15 mg via ORAL
  Filled 2018-06-27 (×9): qty 1

## 2018-06-27 NOTE — Progress Notes (Signed)
Recreation Therapy Notes  Date:  2.17.20 Time: 0930 Location: [address] Dayroom  Group Topic: Stress Management  Goal Area(s) Addresses:  Patient will identify positive stress management techniques. Patient will identify benefits of using stress management post d/c.  Intervention:  Stress Management  Activity :  Meditation.  LRT introduced the stress management technique of meditation.  LRT played a meditation that focused on impermanence.  Patients were to listen and follow as meditation played to engaged in the activity.   Education:  Stress Management, Discharge Planning.   Education Outcome: Acknowledges Education  Clinical Observations/Feedback: Pt did not attend group.     Helem Reesor, LRT/CTRS         [place] A 06/27/2018 11:15 AM 
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
## 2018-06-27 NOTE — Tx Team (Signed)
Interdisciplinary Treatment and Diagnostic Plan Update  06/27/2018 Time of Session: 9:25am Randy [name] MRN: [id number]  Principal Diagnosis: <principal problem not specified>  Secondary Diagnoses: Active Problems:   MDD (major depressive disorder), severe (HCC)   Current Medications:  Current Facility-Administered Medications  Medication Dose Route Frequency Provider Last Rate Last Dose  . acetaminophen (TYLENOL) tablet 650 mg  650 mg Oral Q6H PRN Money, Lowry Ram, FNP      . alum & mag hydroxide-simeth (MAALOX/MYLANTA) 200-200-20 MG/5ML suspension 30 mL  30 mL Oral Q4H PRN Money, Lowry Ram, FNP      . ARIPiprazole (ABILIFY) tablet 5 mg  5 mg Oral Daily Money, Lowry Ram, FNP   5 mg at 06/27/18 0816  . escitalopram (LEXAPRO) tablet 20 mg  20 mg Oral Daily Money, Lowry Ram, FNP   20 mg at 06/27/18 0815  . estradiol (ESTRACE) tablet 2 mg  2 mg Oral TID Money, Lowry Ram, FNP   2 mg at 06/27/18 0815  . finasteride (PROSCAR) tablet 2.5 mg  2.5 mg Oral Daily Money, Lowry Ram, FNP   2.5 mg at 06/27/18 0017  . hydrOXYzine (ATARAX/VISTARIL) tablet 25 mg  25 mg Oral TID PRN Money, Lowry Ram, FNP      . magnesium hydroxide (MILK OF MAGNESIA) suspension 30 mL  30 mL Oral Daily PRN Money, Lowry Ram, FNP      . mirtazapine (REMERON) tablet 15 mg  15 mg Oral QHS Sharma Covert, MD      . nicotine (NICODERM CQ - dosed in mg/24 hours) patch 21 mg  21 mg Transdermal Daily Money, Darnelle Maffucci B, FNP      . prazosin (MINIPRESS) capsule 1 mg  1 mg Oral QHS Money, Lowry Ram, FNP   1 mg at 06/26/18 2112  . spironolactone (ALDACTONE) tablet 50 mg  50 mg Oral Daily Money, Lowry Ram, FNP   50 mg at 06/27/18 0815  . traZODone (DESYREL) tablet 50 mg  50 mg Oral QHS PRN Money, Lowry Ram, FNP       PTA Medications: Medications Prior to Admission  Medication Sig Dispense Refill Last Dose  . ARIPiprazole (ABILIFY) 5 MG tablet Take 1 tablet (5 mg total) by mouth daily. 30 tablet 0 Taking  . escitalopram (LEXAPRO) 20 MG tablet Take  1 tablet (20 mg total) by mouth daily. 30 tablet 0 Taking  . estradiol (ESTRACE) 2 MG tablet Take 1 tablet (2 mg total) by mouth 3 (three) times daily. 45 tablet 1 Taking  . finasteride (PROSCAR) 5 MG tablet Take 0.5 tablets (2.5 mg total) by mouth daily. 15 tablet 0 Taking  . Ledipasvir-Sofosbuvir (HARVONI) 90-400 MG TABS Take 1 tablet by mouth daily. 30 tablet 0 Taking  . mirtazapine (REMERON) 7.5 MG tablet Take 1 tablet (7.5 mg total) by mouth at bedtime. 30 tablet 0 Taking  . nicotine (NICODERM CQ - DOSED IN MG/24 HOURS) 21 mg/24hr patch Place 1 patch (21 mg total) onto the skin daily. 28 patch 0 Taking  . prazosin (MINIPRESS) 1 MG capsule Take 1 capsule (1 mg total) by mouth at bedtime. 30 capsule 0 Taking  . spironolactone (ALDACTONE) 50 MG tablet Take 1 tablet (50 mg total) by mouth daily. 30 tablet 0 Taking    Patient Stressors: Substance abuse Other: Depression  Patient Strengths: Ability for insight Communication skills Motivation for treatment/growth Physical Health  Treatment Modalities: Medication Management, Group therapy, Case management,  1 to 1 session with clinician, Psychoeducation, Recreational therapy.   Physician  Treatment Plan for Primary Diagnosis: <principal problem not specified> Long Term Goal(s):     Short Term Goals:    Medication Management: Evaluate patient's response, side effects, and tolerance of medication regimen.  Therapeutic Interventions: 1 to 1 sessions, Unit Group sessions and Medication administration.  Evaluation of Outcomes: Not Met  Physician Treatment Plan for Secondary Diagnosis: Active Problems:   MDD (major depressive disorder), severe ([place])  Long Term Goal(s):     Short Term Goals:       Medication Management: Evaluate patient's response, side effects, and tolerance of medication regimen.  Therapeutic Interventions: 1 to 1 sessions, Unit Group sessions and Medication administration.  Evaluation of Outcomes: Not Met   RN  Treatment Plan for Primary Diagnosis: <principal problem not specified> Long Term Goal(s): Knowledge of disease and therapeutic regimen to maintain health will improve  Short Term Goals: Ability to participate in decision making will improve, Ability to verbalize feelings will improve, Ability to disclose and discuss suicidal ideas, Ability to identify and develop effective coping behaviors will improve and Compliance with prescribed medications will improve  Medication Management: RN will administer medications as ordered by provider, will assess and evaluate patient's response and provide education to patient for prescribed medication. RN will report any adverse and/or side effects to prescribing provider.  Therapeutic Interventions: 1 on 1 counseling sessions, Psychoeducation, Medication administration, Evaluate responses to treatment, Monitor vital signs and CBGs as ordered, Perform/monitor CIWA, COWS, AIMS and Fall Risk screenings as ordered, Perform wound care treatments as ordered.  Evaluation of Outcomes: Not Met   LCSW Treatment Plan for Primary Diagnosis: <principal problem not specified> Long Term Goal(s): Safe transition to appropriate next level of care at discharge, Engage patient in therapeutic group addressing interpersonal concerns.  Short Term Goals: Engage patient in aftercare planning with referrals and resources  Therapeutic Interventions: Assess for all discharge needs, 1 to 1 time with Social worker, Explore available resources and support systems, Assess for adequacy in community support network, Educate family and significant other(s) on suicide prevention, Complete Psychosocial Assessment, Interpersonal group therapy.  Evaluation of Outcomes: Not Met   Progress in Treatment: Attending groups: No. Participating in groups: No. Taking medication as prescribed: Yes. Toleration medication: Yes. Family/Significant other contact made: No, will contact:  patient declined  consent for collateral contacts Patient understands diagnosis: Yes. Discussing patient identified problems/goals with staff: Yes. Medical problems stabilized or resolved: Yes. Denies suicidal/homicidal ideation: Yes. Issues/concerns per patient self-inventory: No. Other:  New problem(s) identified: None  New Short Term/Long Term Goal(s): medication stabilization, elimination of SI thoughts, development of comprehensive mental wellness plan.    Patient Goals:  "To feel better"  Discharge Plan or Barriers: CSW will continue to follow and assess for appropriate referrals and possible discharge planning.   Reason for Continuation of Hospitalization: Anxiety Depression Medication stabilization Suicidal ideation  Estimated Length of Stay: 06/30/2018  Attendees: Patient: Randy [name]  06/27/2018 11:07 AM  Physician: Dr. Myles Lipps, MD 06/27/2018 11:07 AM  Nursing: Benjamine Mola.Jenetta Downer, RN 06/27/2018 11:07 AM  RN Care Manager: 06/27/2018 11:07 AM  Social Worker: Radonna Ricker, [place] 06/27/2018 11:07 AM  Recreational Therapist:  06/27/2018 11:07 AM  Other: Harriett Sine, NP 06/27/2018 11:07 AM  Other:  06/27/2018 11:07 AM  Other: 06/27/2018 11:07 AM    Scribe for Treatment Team: Marylee Floras, [place] 06/27/2018 11:07 AM

## 2018-06-27 NOTE — [facility] Group Notes (Signed)
[facility] Group Notes:  (Nursing/MHT/Case Management/Adjunct)  Date:  06/27/2018  Time:  5:03 PM  Type of Therapy:  Psychoeducational Skills  Participation Level:  Active  Participation Quality:  Appropriate and Attentive  Affect:  Appropriate  Cognitive:  Alert and Appropriate  Insight:  Appropriate and Good  Engagement in Group:  Engaged  Modes of Intervention:  Discussion and Education  Summary of Progress/Problems:  Discussed self-care. Patient was attentive and participated.  Randy [name] 06/27/2018, 5:03 PM

## 2018-06-27 NOTE — H&P (Signed)
Psychiatric Admission Assessment Adult  Patient Identification: Randy [name] MRN:  [id number] Date of Evaluation:  06/27/2018 Chief Complaint:  Suicidal thoughts Principal Diagnosis: <principal problem not specified> Diagnosis:  Active Problems:   MDD (major depressive disorder), severe (HCC)  History of Present Illness: Patient is seen and examined.  Patient is a 48 year old male to male transgender patient who presented to the behavioral health hospital as a direct admission on 06/26/2018.  The patient had gotten into an argument with her roommate.  The patient related that the patient was "mean to me".  She stated she had been living with a roommate since December 2019.  The relationship had been rocky at best during the entire time.  The patient has a history of severe recurrent major depression without psychotic features.  She was last admitted to the hospital on 11/3 and discharged on 11/13.  She went to [facility] after discharge, and stayed there until December.  She had been discharged on Abilify, Lexapro, mirtazapine, prazosin.  She has a history of posttraumatic stress disorder as well.  She has a history of cocaine dependence and stated that she had not used cocaine until this week but had been sober since December.  She also has a history of opioid dependence.  She was admitted to the hospital for evaluation and stabilization.  Associated Signs/Symptoms: Depression Symptoms:  depressed mood, anhedonia, insomnia, psychomotor retardation, fatigue, feelings of worthlessness/guilt, difficulty concentrating, hopelessness, suicidal thoughts without plan, loss of energy/fatigue, disturbed sleep, (Hypo) Manic Symptoms:  Denied Anxiety Symptoms:  Excessive Worry, Psychotic Symptoms:  Denied PTSD Symptoms: Had a traumatic exposure:  As a child Total Time spent with patient: 30 minutes  Past Psychiatric History: His last psychiatric hospitalization at our facility was on 03/13/2018.  He  was diagnosed with major depression at that time.  He has had several psychiatric hospitalizations in the past.  She has a history of self cutting.  On last admission had been several years since there was any self-mutilation.  Is the patient at risk to self? Yes.    Has the patient been a risk to self in the past 6 months? Yes.    Has the patient been a risk to self within the distant past? Yes.    Is the patient a risk to others? No.  Has the patient been a risk to others in the past 6 months? No.  Has the patient been a risk to others within the distant past? No.   Prior Inpatient Therapy: Prior Inpatient Therapy: Yes Prior Therapy Dates: multiple since 2014 Prior Therapy Facilty/Provider(s): [facility] Reason for Treatment: SI and depression Prior Outpatient Therapy: Prior Outpatient Therapy: Yes Prior Therapy Dates: current Prior Therapy Facilty/Provider(s): [facility] Reason for Treatment: depression Does patient have an ACCT team?: No Does patient have Intensive In-House Services?  : No Does patient have [facility] services? : No Does patient have P4CC services?: No  Alcohol Screening:   Substance Abuse History in the last 12 months:  Yes.   Consequences of Substance Abuse: Medical Consequences:  Hepatitis C Previous Psychotropic Medications: Yes  Psychological Evaluations: Yes  Past Medical History:  Past Medical History:  Diagnosis Date  . Cellulitis  Of  Left Forearm 01/10/2012   From IVDA  . Depression   . Hepatitis C   . 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 I&D EXTREMITY  01/13/2012   Procedure: IRRIGATION AND DEBRIDEMENT EXTREMITY;  Surgeon: Kennieth Rad, MD;  Location: [facility];  Service: Orthopedics;  Laterality: Left;   Family History:  Family History  Problem Relation Age of Onset  . Idiopathic pulmonary fibrosis Father   . Endocrine Disorder Neg Hx    Family Psychiatric  History: Denied Tobacco  Screening:   Social History:  Social History   Substance and Sexual Activity  Alcohol Use Yes   Comment: occ     Social History   Substance and Sexual Activity  Drug Use Yes  . Types: Heroin, "Crack" cocaine, Marijuana   Comment: heroin last used 07/14/14, crack/ had some a few days ago    Additional Social History: Marital status: Single    Pain Medications: please see mar Prescriptions: please see mar Over the Counter: please see mar History of alcohol / drug use?: Yes Longest period of sobriety (when/how long): unknown Name of Substance 1: cocaine 1 - Age of First Use: unknown 1 - Amount (size/oz): unknown 1 - Frequency: 1x a week 1 - Duration: ongoing 1 - Last Use / Amount: 2 days ago Name of Substance 2: heroin 2 - Age of First Use: unknown 2 - Amount (size/oz): unknown 2 - Frequency: 1x a week 2 - Duration: ongoing 2 - Last Use / Amount: 2 days ago                Allergies:   Allergies  Allergen Reactions  . Penicillins Other (See Comments)    Has patient had a PCN reaction causing immediate rash, facial/tongue/throat swelling, SOB or lightheadedness with hypotension: no Has patient had a PCN reaction causing severe rash involving mucus membranes or skin necrosis: No Has patient had a PCN reaction that required hospitalization: No Has patient had a PCN reaction occurring within the last 10 years: Yes If all of the above answers are "NO", then may proceed with Cephalosporin use.  Swollen Joints   Lab Results:  Results for orders placed or performed during the hospital encounter of 06/26/18 (from the past 48 hour(s))  CBC     Status: Abnormal   Collection Time: 06/27/18  6:33 AM  Result Value Ref Range   WBC 7.2 4.0 - 10.5 K/uL   RBC 4.71 4.22 - 5.81 MIL/uL   Hemoglobin 15.2 13.0 - 17.0 g/dL   HCT 85.4 62.7 - 03.5 %   MCV 100.2 (H) 80.0 - 100.0 fL   MCH 32.3 26.0 - 34.0 pg   MCHC 32.2 30.0 - 36.0 g/dL   RDW 00.9 38.1 - 82.9 %   Platelets 164 150 -  400 K/uL   nRBC 0.0 0.0 - 0.2 %    Comment: Performed at [facility], [address] [place], [place] [address]  Comprehensive metabolic panel     Status: Abnormal   Collection Time: 06/27/18  6:33 AM  Result Value Ref Range   Sodium 137 135 - 145 mmol/L   Potassium 4.5 3.5 - 5.1 mmol/L   Chloride 102 98 - 111 mmol/L   CO2 27 22 - 32 mmol/L   Glucose, Bld 90 70 - 99 mg/dL   BUN 14 6 - 20 mg/dL   Creatinine, Ser 9.67 (H) 0.61 - 1.24 mg/dL   Calcium 9.6 8.9 - 89.3 mg/dL   Total Protein 7.6 6.5 - 8.1 g/dL   Albumin 4.1 3.5 - 5.0 g/dL   AST 29 15 - 41 U/L   ALT 18 0 - 44 U/L   Alkaline Phosphatase 44 38 - 126 U/L   Total Bilirubin 0.9  0.3 - 1.2 mg/dL   GFR calc non Af Amer >60 >60 mL/min   GFR calc Af Amer >60 >60 mL/min   Anion gap 8 5 - 15    Comment: Performed at [facility], [address] [place], [place] [address]  Lipid panel     Status: Abnormal   Collection Time: 06/27/18  6:33 AM  Result Value Ref Range   Cholesterol 185 0 - 200 mg/dL   Triglycerides 89 <469<150 mg/dL   HDL 44 >62>40 mg/dL   Total CHOL/HDL Ratio 4.2 RATIO   VLDL 18 0 - 40 mg/dL   LDL Cholesterol 952123 (H) 0 - 99 mg/dL    Comment:        Total Cholesterol/HDL:CHD Risk Coronary Heart Disease Risk Table                     Men   Women  1/2 Average Risk   3.4   3.3  Average Risk       5.0   4.4  2 X Average Risk   9.6   7.1  3 X Average Risk  23.4   11.0        Use the calculated Patient Ratio above and the CHD Risk Table to determine the patient's CHD Risk.        ATP III CLASSIFICATION (LDL):  <100     mg/dL   Optimal  [id number]  mg/dL   Near or Above                    Optimal  130-159  mg/dL   Borderline  [id number]  mg/dL   High  >253>190     mg/dL   Very High Performed at [facility], [address] [place], [place] [address]   TSH     Status: None   Collection Time: 06/27/18  6:33 AM  Result Value Ref Range   TSH 1.613 0.350 - 4.500 uIU/mL     Comment: Performed by a 3rd Generation assay with a functional sensitivity of <=0.01 uIU/mL. Performed at [facility], [address] [place], [place] [address]   Hepatic function panel     Status: None   Collection Time: 06/27/18  6:33 AM  Result Value Ref Range   Total Protein 7.8 6.5 - 8.1 g/dL   Albumin 4.4 3.5 - 5.0 g/dL   AST 28 15 - 41 U/L   ALT 18 0 - 44 U/L   Alkaline Phosphatase 46 38 - 126 U/L   Total Bilirubin 0.9 0.3 - 1.2 mg/dL   Bilirubin, Direct 0.1 0.0 - 0.2 mg/dL   Indirect Bilirubin 0.8 0.3 - 0.9 mg/dL    Comment: Performed at [facility], [address] [place], [place] [address]    Blood Alcohol level:  Lab Results  Component Value Date   [facility]/12[facility]2018   ETH <10 06/12/2017    Metabolic Disorder Labs:  Lab Results  Component Value Date   HGBA1C 5.0 03/20/2018   MPG 96.8 03/20/2018   MPG 108 11/17/2017   Lab Results  Component Value Date   PROLACTIN 14.2 11/17/2017   PROLACTIN 6.3 09/09/2016   Lab Results  Component Value Date   CHOL 185 06/27/2018   TRIG 89 06/27/2018   HDL 44 06/27/2018   CHOLHDL 4.2 06/27/2018   VLDL 18 06/27/2018   LDLCALC 123 (H) 06/27/2018   LDLCALC 100 (H) 03/20/2018  Current Medications: Current Facility-Administered Medications  Medication Dose Route Frequency Provider Last Rate Last Dose  . acetaminophen (TYLENOL) tablet 650 mg  650 mg Oral Q6H PRN Money, Gerlene Burdock, FNP      . alum & mag hydroxide-simeth (MAALOX/MYLANTA) 200-200-20 MG/5ML suspension 30 mL  30 mL Oral Q4H PRN Money, Gerlene Burdock, FNP      . ARIPiprazole (ABILIFY) tablet 5 mg  5 mg Oral Daily Money, Gerlene Burdock, FNP   5 mg at 06/27/18 0816  . escitalopram (LEXAPRO) tablet 20 mg  20 mg Oral Daily Money, Gerlene Burdock, FNP   20 mg at 06/27/18 0815  . estradiol (ESTRACE) tablet 2 mg  2 mg Oral TID Money, Gerlene Burdock, FNP   2 mg at 06/27/18 1259  . finasteride (PROSCAR) tablet 2.5 mg  2.5 mg Oral Daily Money, Gerlene Burdock,  FNP   2.5 mg at 06/27/18 1610  . hydrOXYzine (ATARAX/VISTARIL) tablet 25 mg  25 mg Oral TID PRN Money, Gerlene Burdock, FNP      . magnesium hydroxide (MILK OF MAGNESIA) suspension 30 mL  30 mL Oral Daily PRN Money, Feliz Beam B, FNP      . mirtazapine (REMERON) tablet 15 mg  15 mg Oral QHS Antonieta Pert, MD      . nicotine (NICODERM CQ - dosed in mg/24 hours) patch 21 mg  21 mg Transdermal Daily Money, Gerlene Burdock, FNP   21 mg at 06/27/18 9604  . prazosin (MINIPRESS) capsule 1 mg  1 mg Oral QHS Money, [name], FNP   1 mg at 06/26/18 2112  . spironolactone (ALDACTONE) tablet 50 mg  50 mg Oral Daily Money, Gerlene Burdock, FNP   50 mg at 06/27/18 0815  . traZODone (DESYREL) tablet 50 mg  50 mg Oral QHS PRN Money, Gerlene Burdock, FNP       PTA Medications: Medications Prior to Admission  Medication Sig Dispense Refill Last Dose  . ARIPiprazole (ABILIFY) 5 MG tablet Take 1 tablet (5 mg total) by mouth daily. 30 tablet 0 Taking  . escitalopram (LEXAPRO) 20 MG tablet Take 1 tablet (20 mg total) by mouth daily. 30 tablet 0 Taking  . estradiol (ESTRACE) 2 MG tablet Take 1 tablet (2 mg total) by mouth 3 (three) times daily. 45 tablet 1 Taking  . finasteride (PROSCAR) 5 MG tablet Take 0.5 tablets (2.5 mg total) by mouth daily. 15 tablet 0 Taking  . Ledipasvir-Sofosbuvir (HARVONI) 90-400 MG TABS Take 1 tablet by mouth daily. 30 tablet 0 Taking  . mirtazapine (REMERON) 7.5 MG tablet Take 1 tablet (7.5 mg total) by mouth at bedtime. 30 tablet 0 Taking  . nicotine (NICODERM CQ - DOSED IN MG/24 HOURS) 21 mg/24hr patch Place 1 patch (21 mg total) onto the skin daily. 28 patch 0 Taking  . prazosin (MINIPRESS) 1 MG capsule Take 1 capsule (1 mg total) by mouth at bedtime. 30 capsule 0 Taking  . spironolactone (ALDACTONE) 50 MG tablet Take 1 tablet (50 mg total) by mouth daily. 30 tablet 0 Taking    Musculoskeletal: Strength & Muscle Tone: within normal limits Gait & Station: normal Patient leans: N/A  Psychiatric Specialty  Exam: Physical Exam  Nursing note and vitals reviewed. Constitutional: She is oriented to person, place, and time. She appears well-developed and well-nourished.  HENT:  Head: Normocephalic and atraumatic.  Respiratory: Effort normal.  Neurological: She is alert and oriented to person, place, and time.    ROS  Blood pressure 101/65, pulse 71, temperature 97.8  F (36.6 C), temperature source Oral, resp. rate 18, height 5\' 8"  (1.727 m), weight 94.6 kg, SpO2 96 %.Body mass index is 31.72 kg/m.  General Appearance: Disheveled  Eye Contact:  Poor  Speech:  Slow  Volume:  Decreased  Mood:  Depressed  Affect:  Congruent  Thought Process:  Coherent and Descriptions of Associations: Intact  Orientation:  Full (Time, Place, and Person)  Thought Content:  Logical  Suicidal Thoughts:  Yes.  without intent/plan  Homicidal Thoughts:  No  Memory:  Immediate;   Fair Recent;   Fair Remote;   Fair  Judgement:  Impaired  Insight:  Fair  Psychomotor Activity:  Psychomotor Retardation  Concentration:  Concentration: Fair and Attention Span: Fair  Recall:  [employer] of Knowledge:  Fair  Language:  Good  Akathisia:  Negative  Handed:  Right  AIMS (if indicated):     Assets:  Desire for Improvement Financial Resources/Insurance Housing Physical Health Resilience  ADL's:  Intact  Cognition:  WNL  Sleep:  Number of Hours: 6.5    Treatment Plan Summary: Daily contact with patient to assess and evaluate symptoms and progress in treatment, Medication management and Plan : Patient is seen and examined.  Patient is a 48 year old male with the above-stated past psychiatric history who was admitted with suicidal ideation.  He will be admitted to the inpatient unit.  He will be integrated into the milieu.  He will be encouraged to attend groups.  He will be encouraged to work on his coping skills.  He will be continued on his Abilify, Lexapro, hydroxyzine, prazosin and trazodone.  His mirtazapine will  be increased to 15 mg p.o. nightly.  With regards to his medical situation his creatinine is mildly elevated at 1.27, liver function enzymes are normal.  His MCV is mildly elevated at 100.2.  His drug screen is still pending.  TSH is normal.  His medications for his hepatitis C will be continued as well.  Observation Level/Precautions:  15 minute checks  Laboratory:  Chemistry Profile  Psychotherapy:    Medications:    Consultations:    Discharge Concerns:    Estimated LOS:  Other:     Physician Treatment Plan for Primary Diagnosis: <principal problem not specified> Long Term Goal(s): Improvement in symptoms so as ready for discharge  Short Term Goals: Ability to identify changes in lifestyle to reduce recurrence of condition will improve, Ability to verbalize feelings will improve, Ability to disclose and discuss suicidal ideas, Ability to demonstrate self-control will improve, Ability to identify and develop effective coping behaviors will improve, Ability to maintain clinical measurements within normal limits will improve, Compliance with prescribed medications will improve and Ability to identify triggers associated with substance abuse/mental health issues will improve  Physician Treatment Plan for Secondary Diagnosis: Active Problems:   MDD (major depressive disorder), severe (HCC)  Long Term Goal(s): Improvement in symptoms so as ready for discharge  Short Term Goals: Ability to identify changes in lifestyle to reduce recurrence of condition will improve, Ability to verbalize feelings will improve, Ability to disclose and discuss suicidal ideas, Ability to demonstrate self-control will improve, Ability to identify and develop effective coping behaviors will improve, Ability to maintain clinical measurements within normal limits will improve, Compliance with prescribed medications will improve and Ability to identify triggers associated with substance abuse/mental health issues will  improve  I certify that inpatient services furnished can reasonably be expected to improve the patient's condition.    Antonieta Pert, MD  2/17/20202:25 PM
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
## 2018-06-27 NOTE — B[facility] Group Notes (Signed)
LCSW Group Therapy Note 06/27/2018 4:07 PM  Type of Therapy and Topic: Group Therapy: Overcoming Obstacles  Participation Level: Did Not Attend  Description of Group:  In this group patients will be encouraged to explore what they see as obstacles to their own wellness and recovery. They will be guided to discuss their thoughts, feelings, and behaviors related to these obstacles. The group will process together ways to cope with barriers, with attention given to specific choices patients can make. Each patient will be challenged to identify changes they are motivated to make in order to overcome their obstacles. This group will be process-oriented, with patients participating in exploration of their own experiences as well as giving and receiving support and challenge from other group members.  Therapeutic Goals: 1. Patient will identify personal and current obstacles as they relate to admission. 2. Patient will identify barriers that currently interfere with their wellness or overcoming obstacles.  3. Patient will identify feelings, thought process and behaviors related to these barriers. 4. Patient will identify two changes they are willing to make to overcome these obstacles:   Summary of Patient Progress  Invited, chose not to attend.     Therapeutic Modalities:  Cognitive Behavioral Therapy Solution Focused Therapy Motivational Interviewing Relapse Prevention Therapy   Alcario Drought Clinical Social Worker

## 2018-06-28 DIAGNOSIS — F322 Major depressive disorder, single episode, severe without psychotic features: Secondary | ICD-10-CM

## 2018-06-28 DIAGNOSIS — F1424 Cocaine dependence with cocaine-induced mood disorder: Secondary | ICD-10-CM

## 2018-06-28 LAB — HEMOGLOBIN A1C
Hgb A1c MFr Bld: 5.1 % (ref 4.8–5.6)
Mean Plasma Glucose: 100 mg/dL

## 2018-06-28 NOTE — Progress Notes (Signed)
Patient ID: Randy [name], adult   DOB: 1971/04/12, 48 y.o.   MRN: [id number]   D: Patient pleasant on approach tonight. Isolating in her room and not attending group. Reports that she has had increased depression and has SI "on and off". Contracts on the unit. A: Staff will monitor on q 15 minute checks, follow treatment plans, and give medications as ordered. R: Cooperative on the unit
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
## 2018-06-28 NOTE — [facility] Group Notes (Signed)
Pt did not attend wrap up group this evening.  

## 2018-06-28 NOTE — Plan of Care (Signed)
D: Patient in dayroom not interacting on approach. Patient is alert, oriented, pleasant, and cooperative. Patient endorses passive SI. Denies HI, AVH, and verbally contracts for safety. Patient rates her day 5/10 and reports being tired. Patient states she is "trying to go with out the patch" but doesn't want to talk about why. Patient affect is depressed. Patient does not appear to be in acute distress/pain.   A: Medications administered per MD order. Support provided. Patient educated on safety on the unit and medications. Routine safety checks every 15 minutes. Patient stated understanding to tell nurse about any new physical symptoms. Patient understands to tell staff of any needs.     R: No adverse drug reactions noted. Patient verbally contracts for safety. Patient remains safe at this time and will continue to monitor.   Problem: Safety: Goal: Periods of time without injury will increase Outcome: Progressing   Problem: Medication: Goal: Compliance with prescribed medication regimen will improve Outcome: Progressing   Patient is taking medication as prescribed. Patient remains safe and will continue to monitor.

## 2018-06-28 NOTE — [facility] Group Notes (Signed)
LCSW Group Therapy Note 06/28/2018 3:29 PM  Type of Therapy/Topic: Group Therapy: Feelings about Diagnosis  Participation Level: Did Not Attend   Description of Group:  This group will allow patients to explore their thoughts and feelings about diagnoses they have received. Patients will be guided to explore their level of understanding and acceptance of these diagnoses. Facilitator will encourage patients to process their thoughts and feelings about the reactions of others to their diagnosis and will guide patients in identifying ways to discuss their diagnosis with significant others in their lives. This group will be process-oriented, with patients participating in exploration of their own experiences, giving and receiving support, and processing challenge from other group members.  Therapeutic Goals: 1. Patient will demonstrate understanding of diagnosis as evidenced by identifying two or more symptoms of the disorder 2. Patient will be able to express two feelings regarding the diagnosis 3. Patient will demonstrate their ability to communicate their needs through discussion and/or role play  Summary of Patient Progress:  Invited, chose not to attend.     Therapeutic Modalities:  Cognitive Behavioral Therapy Brief Therapy Feelings Identification    Flois Mctague LCSWA Clinical Social Worker   

## 2018-06-28 NOTE — Progress Notes (Signed)
Recreation Therapy Notes  Animal-Assisted Activity (AAA) Program Checklist/Progress Notes Patient Eligibility Criteria Checklist & Daily Group note for Rec Tx Intervention  Date: 2.18. 20 Time: 1430 Location: 400 Hall Dayroom  AAA/T Program Assumption of Risk Form signed by Patient/ or Parent Legal Guardian  YES   Patient is free of allergies or sever asthma   YES   Patient reports no fear of animals  YES   Patient reports no history of cruelty to animals  YES   Patient understands his/her participation is voluntary  YES   Patient washes hands before animal contact  YES  Patient washes hands after animal contact  YES   Education: Charity fundraiser, Appropriate Animal Interaction   Education Outcome: Acknowledges understanding/In group clarification offered/Needs additional education.   Clinical Observations/Feedback: Pt did not attend group activity.    Caroll Rancher, LRT/CTRS         Lillia Abed, Isella Slatten A 06/28/2018 3:01 PM

## 2018-06-28 NOTE — Progress Notes (Signed)
Patient ID: Randy [name], adult   DOB: 09/02/1970, 48 y.o.   MRN: [id number]  Nursing Progress Note [id number]  Data: Patient is male to male transgender requesting staff to use she/her pronouns. On initial approach, patient is resting in her room awake. Patient presents sad/sullen and is cautious/guarded during interactions. Patient compliant with scheduled medications. Patient denies pain/physical complaints. Patient completed self-inventory sheet and rates depression, hopelessness, and anxiety 8,8,6 respectively. Patient rates their sleep and appetite as fair/good respectively. Patient states goal for today is to "go without a nicotine patch". Patient tends to isolate to her room. Patient currently reports passive SI but denies HI/AVH.   Action: Patient is educated about and provided medication per provider's orders. Patient safety maintained with q15 min safety checks and frequent rounding. Low fall risk precautions in place. Emotional support given. 1:1 interaction and active listening provided. Patient encouraged to attend meals, groups, and work on treatment plan and goals. Labs, vital signs and patient behavior monitored throughout shift.   Response: Patient contacts for safety with staff. Patient remains safe on the unit at this time and agrees to come to staff with any issues/concerns. Will continue to support and monitor.

## 2018-06-29 LAB — ESTRADIOL, FREE
ESTRADIOL FREE: 1.69 pg/mL — AB
Estradiol: 89 pg/mL — ABNORMAL HIGH

## 2018-06-29 NOTE — Progress Notes (Signed)
D: Pt  Passive SI-contracts for safety denies HI/AVH. Pt is pleasant and cooperative. Pt doing better, somewhat depressed, but bright affect when engaged.  A: Pt was offered support and encouragement. Pt was given scheduled medications. Pt was encourage to attend groups. Q 15 minute checks were done for safety.  R:Pt attends groups and interacts well with peers and staff. Pt is taking medication. Pt has no complaints.Pt receptive to treatment and safety maintained on unit.  Problem: Education: Goal: Mental status will improve Outcome: Progressing   Problem: Education: Goal: Verbalization of understanding the information provided will improve Outcome: Progressing   Problem: Activity: Goal: Interest or engagement in activities will improve Outcome: Progressing
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
## 2018-06-29 NOTE — [facility] Group Notes (Addendum)
Adult Psychoeducational Group Note  Date:  06/29/2018 Time:  10:54 PM  Group Topic/Focus:  Wrap-Up Group:   The focus of this group is to help patients review their daily goal of treatment and discuss progress on daily workbooks.  Participation Level:  Active  Participation Quality:  Appropriate and Attentive  Affect:  Appropriate  Cognitive:  Alert and Appropriate  Insight: Appropriate and Good  Engagement in Group:  Engaged  Modes of Intervention:  Discussion and Education  Additional Comments:  Pt attended and participated in wrap up group this evening. Pt rated their day a 5/10, due to their day being rough until they received a nicotine patch. Pt did not complete their goal, to not have to wear a nicotine patch today.     Randy [name] 06/29/2018, 10:54 PM

## 2018-06-29 NOTE — [facility] Group Notes (Signed)
[facility] Group Notes:  Nursing Psychoeducational Group  Date:  06/29/2018  Time:  4:00 PM  Type of Therapy:  Psychoeducational Skills  Participation Level:  Minimal  Participation Quality:  Resistant  Affect:  Blunted and Anxious  Cognitive:  Alert and Oriented  Insight:  Unable to Assess  Engagement in Group:  Resistant  Modes of Intervention:  Discussion, Socialization and Support  Summary of Progress/Problems: Patient did not contribute to the group discussion but did listen and stay for the entire group.  Marchelle Folks A Chin Wachter 06/29/2018, 5:30 PM

## 2018-06-29 NOTE — Progress Notes (Signed)
Patient denies SI, HI and AVH this shift.  Patient has been compliant with medications, attended groups and engaged in activities on the unit.  Patient had no issues of behavioral dyscontrol.   Asses patient for safety, offer medications as prescribed, engage patient in 1:1 staff talks.   Patient able to contract for safety.  Continue to monitor as planned 

## 2018-06-30 NOTE — Progress Notes (Signed)
PER STATE REGULATIONS 482.30  THIS CHART WAS REVIEWED FOR MEDICAL NECESSITY WITH RESPECT TO THE PATIENT'S ADMISSION/ DURATION OF STAY.  NEXT REVIEW DATE:  07/04/2018 Willa Rough, RN, BSN CASE MANAGER

## 2018-06-30 NOTE — [facility] Group Notes (Signed)
[facility] [employer] Group Therapy 06/30/2018 11:26 AM  Type of Therapy: Mental Health Association Presentation  Participation Level: Did not attend   Modes of Intervention: Discussion, Education and Socialization   Summary of Progress/Problems: Mental Health Association (MHA) Speaker came to talk about his personal journey with mental health.  Enid Cutter, MSW, [facility] 06/30/2018 11:26 AM
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
## 2018-06-30 NOTE — Progress Notes (Signed)
Pt attended wrap-up group tonight. Pt appears flat in affect and mood. Pt denies SI/HI/AVH/Pain at this time. Pt states she is not ready for discharge. No new c/o's. Pt took meds as prescribed. Support offered. Will continue with POC.

## 2018-06-30 NOTE — Plan of Care (Signed)
  Problem: Activity: Goal: Interest or engagement in activities will improve Outcome: Progressing   Problem: Safety: Goal: Periods of time without injury will increase Outcome: Progressing  DAR NOTE: Patient presents with anxious affect and depressed mood.  Vaguely reports passive SI but verbally contracts for safety.  Denies pain, auditory and visual hallucinations.  Rates depression at 5, hopelessness at 5, and anxiety at 6.  Maintained on routine safety checks.  Medications given as prescribed.  Support and encouragement offered as needed.  Attended group and participated.  States goal for today is "finding a place to go after I leave."  Patient visible in milieu with minimal interaction.  Patient is safe on and off the unit.

## 2018-07-01 LAB — BASIC METABOLIC PANEL
Anion gap: 6 (ref 5–15)
BUN: 16 mg/dL (ref 6–20)
CO2: 21 mmol/L — ABNORMAL LOW (ref 22–32)
Calcium: 8.8 mg/dL — ABNORMAL LOW (ref 8.9–10.3)
Chloride: 109 mmol/L (ref 98–111)
Creatinine, Ser: 1.19 mg/dL (ref 0.61–1.24)
GFR calc Af Amer: >60 mL/min (ref >60)
GFR calc non Af Amer: >60 mL/min (ref >60)
Glucose, Bld: 114 mg/dL — ABNORMAL HIGH (ref 70–99)
Potassium: 4 mmol/L (ref 3.5–5.1)
Sodium: 136 mmol/L (ref 135–145)

## 2018-07-01 NOTE — [facility] Group Notes (Signed)
Adult Psychoeducational Group Note  Date:  07/01/2018 Time:  10:21 PM  Group Topic/Focus:  Wrap-Up Group:   The focus of this group is to help patients review their daily goal of treatment and discuss progress on daily workbooks.  Participation Level:  Active  Participation Quality:  Appropriate and Attentive  Affect:  Appropriate  Cognitive:  Alert and Appropriate  Insight: Appropriate and Good  Engagement in Group:  Engaged  Modes of Intervention:  Discussion and Education  Additional Comments:  Pt attended and participated in wrap up group this evening. Pt rated their day a 6/10, due to them working on their discharge plane, to leave on Monday, which was also the pt goal.   Chrisandra Netters 07/01/2018, 10:21 PM

## 2018-07-01 NOTE — Tx Team (Signed)
Interdisciplinary Treatment and Diagnostic Plan Update  07/01/2018 Time of Session: 9:25am Randy [name] MRN: [id number]  Principal Diagnosis: <principal problem not specified>  Secondary Diagnoses: Active Problems:   MDD (major depressive disorder), severe (HCC)   Current Medications:  Current Facility-Administered Medications  Medication Dose Route Frequency Provider Last Rate Last Dose  . acetaminophen (TYLENOL) tablet 650 mg  650 mg Oral Q6H PRN Money, [name], FNP      . alum & mag hydroxide-simeth (MAALOX/MYLANTA) 200-200-20 MG/5ML suspension 30 mL  30 mL Oral Q4H PRN Money, [name], FNP      . ARIPiprazole (ABILIFY) tablet 5 mg  5 mg Oral Daily Money, [name], FNP   5 mg at 07/01/18 1028  . escitalopram (LEXAPRO) tablet 20 mg  20 mg Oral Daily Money, [name], FNP   20 mg at 07/01/18 1027  . estradiol (ESTRACE) tablet 2 mg  2 mg Oral TID Money, [name], FNP   2 mg at 07/01/18 1028  . finasteride (PROSCAR) tablet 2.5 mg  2.5 mg Oral Daily Money, [name], FNP   2.5 mg at 07/01/18 1028  . hydrOXYzine (ATARAX/VISTARIL) tablet 25 mg  25 mg Oral TID PRN Money, [name], FNP      . magnesium hydroxide (MILK OF MAGNESIA) suspension 30 mL  30 mL Oral Daily PRN Money, [name], FNP      . mirtazapine (REMERON) tablet 15 mg  15 mg Oral QHS [name], [name], MD   15 mg at 06/30/18 2113  . nicotine (NICODERM CQ - dosed in mg/24 hours) patch 21 mg  21 mg Transdermal Daily Money, [name], FNP   21 mg at 07/01/18 1028  . prazosin (MINIPRESS) capsule 1 mg  1 mg Oral QHS Money, [name], FNP   1 mg at 06/30/18 2113  . spironolactone (ALDACTONE) tablet 50 mg  50 mg Oral Daily Money, [name], FNP   50 mg at 07/01/18 1027   PTA Medications: Medications Prior to Admission  Medication Sig Dispense Refill Last Dose  . ARIPiprazole (ABILIFY) 5 MG tablet Take 1 tablet (5 mg total) by mouth daily. 30 tablet 0 Taking  . escitalopram (LEXAPRO) 20 MG tablet Take 1 tablet (20 mg total) by mouth daily.  30 tablet 0 Taking  . estradiol (ESTRACE) 2 MG tablet Take 1 tablet (2 mg total) by mouth 3 (three) times daily. 45 tablet 1 Taking  . finasteride (PROSCAR) 5 MG tablet Take 0.5 tablets (2.5 mg total) by mouth daily. 15 tablet 0 Taking  . Ledipasvir-Sofosbuvir (HARVONI) 90-400 MG TABS Take 1 tablet by mouth daily. 30 tablet 0 Taking  . mirtazapine (REMERON) 7.5 MG tablet Take 1 tablet (7.5 mg total) by mouth at bedtime. 30 tablet 0 Taking  . nicotine (NICODERM CQ - DOSED IN MG/24 HOURS) 21 mg/24hr patch Place 1 patch (21 mg total) onto the skin daily. 28 patch 0 Taking  . prazosin (MINIPRESS) 1 MG capsule Take 1 capsule (1 mg total) by mouth at bedtime. 30 capsule 0 Taking  . spironolactone (ALDACTONE) 50 MG tablet Take 1 tablet (50 mg total) by mouth daily. 30 tablet 0 Taking    Patient Stressors: Substance abuse Other: Depression  Patient Strengths: Ability for insight Communication skills Motivation for treatment/growth Physical Health  Treatment Modalities: Medication Management, Group therapy, Case management,  1 to 1 session with clinician, Psychoeducation, Recreational therapy.   Physician Treatment Plan for Primary Diagnosis: <principal problem not specified> Long Term Goal(s): Improvement in symptoms  so as ready for discharge Improvement in symptoms so as ready for discharge   Short Term Goals: Ability to identify changes in lifestyle to reduce recurrence of condition will improve Ability to verbalize feelings will improve Ability to disclose and discuss suicidal ideas Ability to demonstrate self-control will improve Ability to identify and develop effective coping behaviors will improve Ability to maintain clinical measurements within normal limits will improve Compliance with prescribed medications will improve Ability to identify triggers associated with substance abuse/mental health issues will improve Ability to identify changes in lifestyle to reduce recurrence of  condition will improve Ability to verbalize feelings will improve Ability to disclose and discuss suicidal ideas Ability to demonstrate self-control will improve Ability to identify and develop effective coping behaviors will improve Ability to maintain clinical measurements within normal limits will improve Compliance with prescribed medications will improve Ability to identify triggers associated with substance abuse/mental health issues will improve  Medication Management: Evaluate patient's response, side effects, and tolerance of medication regimen.  Therapeutic Interventions: 1 to 1 sessions, Unit Group sessions and Medication administration.  Evaluation of Outcomes: Progressing  Physician Treatment Plan for Secondary Diagnosis: Active Problems:   MDD (major depressive disorder), severe (HCC)  Long Term Goal(s): Improvement in symptoms so as ready for discharge Improvement in symptoms so as ready for discharge   Short Term Goals: Ability to identify changes in lifestyle to reduce recurrence of condition will improve Ability to verbalize feelings will improve Ability to disclose and discuss suicidal ideas Ability to demonstrate self-control will improve Ability to identify and develop effective coping behaviors will improve Ability to maintain clinical measurements within normal limits will improve Compliance with prescribed medications will improve Ability to identify triggers associated with substance abuse/mental health issues will improve Ability to identify changes in lifestyle to reduce recurrence of condition will improve Ability to verbalize feelings will improve Ability to disclose and discuss suicidal ideas Ability to demonstrate self-control will improve Ability to identify and develop effective coping behaviors will improve Ability to maintain clinical measurements within normal limits will improve Compliance with prescribed medications will improve Ability to  identify triggers associated with substance abuse/mental health issues will improve     Medication Management: Evaluate patient's response, side effects, and tolerance of medication regimen.  Therapeutic Interventions: 1 to 1 sessions, Unit Group sessions and Medication administration.  Evaluation of Outcomes: Progressing   RN Treatment Plan for Primary Diagnosis: <principal problem not specified> Long Term Goal(s): Knowledge of disease and therapeutic regimen to maintain health will improve  Short Term Goals: Ability to participate in decision making will improve, Ability to verbalize feelings will improve, Ability to disclose and discuss suicidal ideas, Ability to identify and develop effective coping behaviors will improve and Compliance with prescribed medications will improve  Medication Management: RN will administer medications as ordered by provider, will assess and evaluate patient's response and provide education to patient for prescribed medication. RN will report any adverse and/or side effects to prescribing provider.  Therapeutic Interventions: 1 on 1 counseling sessions, Psychoeducation, Medication administration, Evaluate responses to treatment, Monitor vital signs and CBGs as ordered, Perform/monitor CIWA, COWS, AIMS and Fall Risk screenings as ordered, Perform wound care treatments as ordered.  Evaluation of Outcomes: Progressing   LCSW Treatment Plan for Primary Diagnosis: <principal problem not specified> Long Term Goal(s): Safe transition to appropriate next level of care at discharge, Engage patient in therapeutic group addressing interpersonal concerns.  Short Term Goals: Engage patient in aftercare planning with referrals and resources  Therapeutic Interventions: Assess for all discharge needs, 1 to 1 time with Child psychotherapist, Explore available resources and support systems, Assess for adequacy in community support network, Educate family and significant other(s) on  suicide prevention, Complete Psychosocial Assessment, Interpersonal group therapy.  Evaluation of Outcomes: Progressing   Progress in Treatment: Attending groups: No. Participating in groups: No. Taking medication as prescribed: Yes. Toleration medication: Yes. Family/Significant other contact made: No, will contact:  patient declined consent for collateral contacts Patient understands diagnosis: Yes. Discussing patient identified problems/goals with staff: Yes. Medical problems stabilized or resolved: Yes. Denies suicidal/homicidal ideation: Yes. Issues/concerns per patient self-inventory: No. Other:  New problem(s) identified: None  New Short Term/Long Term Goal(s): medication stabilization, elimination of SI thoughts, development of comprehensive mental wellness plan.    Patient Goals:  "To feel better"  Discharge Plan or Barriers: Patient reports she is discharging home with a friend. She also states that she will follow up at [facility] for outpatient medication management and therapy services at discharge. CSW will continue to follow.   Reason for Continuation of Hospitalization: Anxiety Depression Medication stabilization Suicidal ideation  Estimated Length of Stay: 07/04/2018  Attendees: Patient: Randy [name]  07/01/2018 11:04 AM  Physician: Dr. Landry Mellow, MD; Dr. Nehemiah Massed, MD 07/01/2018 11:04 AM  Nursing: Lanora Manis.Val Eagle, RN; [facility] 07/01/2018 11:04 AM  RN Care Manager: 07/01/2018 11:04 AM  Social Worker: Baldo Daub, LCSWA 07/01/2018 11:04 AM  Recreational Therapist:  07/01/2018 11:04 AM  Other: Marciano Sequin, NP 07/01/2018 11:04 AM  Other:  07/01/2018 11:04 AM  Other: 07/01/2018 11:04 AM    Scribe for Treatment Team: Maeola Sarah, LCSWA 07/01/2018 11:04 AM

## 2018-07-01 NOTE — Plan of Care (Signed)
D: Pt passive SI- contracts for safety denies HI/AV. Pt is pleasant and cooperative. Pt visible in dayroom laughing and joking with peers. Pt stated she was ready for D/C on Monday A: Pt was offered support and encouragement. Pt was given scheduled medications. Pt was encourage to attend groups. Q 15 minute checks were done for safety.  R:Pt attends groups and interacts well with peers and staff. Pt is taking medication. Pt has no complaints.Pt receptive to treatment and safety maintained on unit.  Problem: Education: Goal: Knowledge of [facility] Education information/materials will improve Outcome: Progressing   Problem: Education: Goal: Emotional status will improve Outcome: Progressing   Problem: Activity: Goal: Interest or engagement in activities will improve Outcome: Progressing

## 2018-07-01 NOTE — Progress Notes (Addendum)
[facility] MD Progress Note  07/01/2018 1:12 PM Randy [name]  MRN:  [id number] Subjective:  "I'm ok."  Randy [name] found resting in bed. Presents with constricted affect and minimal speech but reports that mood is improving. She reports occasional passive SI but denies suicidal plan or intent and contracts for safety on the unit. Reports anxiety is mostly related to discharge planning, as this admission was largely related to conflict with roommate. She has a friend who has agreed to take her in until she can find an apartment. Denies HI/AVH. Denies medication side effects. She has been participating in some groups. No agitated or disruptive behaviors on the unit.  From admission H&P: Patient is a 48 year old male to male transgender patient who presented to the behavioral health hospital as a direct admission on 06/26/2018. The patient had gotten into an argument with her roommate. The patient related that the patient was "mean to me". She stated she had been living with a roommate since December 2019. The relationship had been rocky at best during the entire time. The patient has a history of severe recurrent major depression without psychotic features. She was last admitted to the hospital on 11/3 and discharged on 11/13. She went to [facility] after discharge, and stayed there until December. She had been discharged on Abilify, Lexapro, mirtazapine, prazosin. She has a history of posttraumatic stress disorder as well. She has a history of cocaine dependence and stated that she had not used cocaine until this week but had been sober since December. She also has a history of opioid dependence.   Principal Problem: MDD (major depressive disorder), severe (HCC) Diagnosis: Principal Problem:   MDD (major depressive disorder), severe (HCC)  Total Time spent with patient: 15 minutes  Past Psychiatric History: See admission H&P  Past Medical History:  Past Medical History:  Diagnosis Date  .  Cellulitis  Of  Left Forearm 01/10/2012   From IVDA  . Depression   . Hepatitis C   . 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 I&D EXTREMITY  01/13/2012   Procedure: IRRIGATION AND DEBRIDEMENT EXTREMITY;  Surgeon: Kennieth Rad, MD;  Location: [facility];  Service: Orthopedics;  Laterality: Left;   Family History:  Family History  Problem Relation Age of Onset  . Idiopathic pulmonary fibrosis Father   . Endocrine Disorder Neg Hx    Family Psychiatric  History: See admission H&P Social History:  Social History   Substance and Sexual Activity  Alcohol Use Yes   Comment: occ     Social History   Substance and Sexual Activity  Drug Use Yes  . Types: Heroin, "Crack" cocaine, Marijuana   Comment: heroin last used 07/14/14, crack/ had some a few days ago    Social History   Socioeconomic History  . Marital status: Single    Spouse name: Not on file  . Number of children: Not on file  . Years of education: Not on file  . Highest education level: Not on file  Occupational History  . 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 Financial resource strain: Very hard  . Food insecurity:    Worry: Often true    Inability: Often true  . Transportation needs:    Medical: Yes    Non-medical: Yes  Tobacco Use  . Smoking status: Current Every Day Smoker    Packs/day: 0.50    Years: 20.00  Pack years: 10.00    Types: Cigarettes  . Smokeless tobacco: Never Used  . Tobacco comment: trying to quit-using e-cig some  Substance and Sexual Activity  . Alcohol use: Yes    Comment: occ  . Drug use: Yes    Types: Heroin, "Crack" cocaine, Marijuana    Comment: heroin last used 07/14/14, crack/ had some a few days ago  . Sexual activity: Yes    Partners: Male    Birth control/protection: Condom    Comment: Has been HIV tested in past and has been negative.  Lifestyle  . Physical activity:    Days  per week: Patient refused    Minutes per session: Patient refused  . Stress: Rather much  Relationships  . Social connections:    Talks on phone: Patient refused    Gets together: Patient refused    Attends religious service: Patient refused    Active member of club or organization: Patient refused    Attends meetings of clubs or organizations: Patient refused    Relationship status: Patient refused  Other Topics Concern  . Not on file  Social History Narrative   Transgendered male.  Single.  Lives alone.  Independent.   Additional Social History:    Pain Medications: please see mar Prescriptions: please see mar Over the Counter: please see mar History of alcohol / drug use?: Yes Longest period of sobriety (when/how long): unknown Name of Substance 1: cocaine 1 - Age of First Use: unknown 1 - Amount (size/oz): unknown 1 - Frequency: 1x a week 1 - Duration: ongoing 1 - Last Use / Amount: 2 days ago Name of Substance 2: heroin 2 - Age of First Use: unknown 2 - Amount (size/oz): unknown 2 - Frequency: 1x a week 2 - Duration: ongoing 2 - Last Use / Amount: 2 days ago                Sleep: Good  Appetite:  Good  Current Medications: Current Facility-Administered Medications  Medication Dose Route Frequency Provider Last Rate Last Dose  . acetaminophen (TYLENOL) tablet 650 mg  650 mg Oral Q6H PRN Money, Gerlene Burdock, FNP      . alum & mag hydroxide-simeth (MAALOX/MYLANTA) 200-200-20 MG/5ML suspension 30 mL  30 mL Oral Q4H PRN Money, Gerlene Burdock, FNP      . ARIPiprazole (ABILIFY) tablet 5 mg  5 mg Oral Daily Money, Gerlene Burdock, FNP   5 mg at 07/01/18 1028  . escitalopram (LEXAPRO) tablet 20 mg  20 mg Oral Daily Money, Feliz Beam B, FNP   20 mg at 07/01/18 1027  . estradiol (ESTRACE) tablet 2 mg  2 mg Oral TID Money, Gerlene Burdock, FNP   2 mg at 07/01/18 1028  . finasteride (PROSCAR) tablet 2.5 mg  2.5 mg Oral Daily Money, Gerlene Burdock, FNP   2.5 mg at 07/01/18 1028  . hydrOXYzine  (ATARAX/VISTARIL) tablet 25 mg  25 mg Oral TID PRN Money, Gerlene Burdock, FNP      . magnesium hydroxide (MILK OF MAGNESIA) suspension 30 mL  30 mL Oral Daily PRN Money, Feliz Beam B, FNP      . mirtazapine (REMERON) tablet 15 mg  15 mg Oral QHS Antonieta Pert, MD   15 mg at 06/30/18 2113  . nicotine (NICODERM CQ - dosed in mg/24 hours) patch 21 mg  21 mg Transdermal Daily Money, Feliz Beam B, FNP   21 mg at 07/01/18 1028  . prazosin (MINIPRESS) capsule 1 mg  1 mg Oral  QHS Money, Gerlene Burdock, FNP   1 mg at 06/30/18 2113  . spironolactone (ALDACTONE) tablet 50 mg  50 mg Oral Daily Money, Gerlene Burdock, FNP   50 mg at 07/01/18 1027    Lab Results:  Results for orders placed or performed during the hospital encounter of 06/26/18 (from the past 48 hour(s))  Basic metabolic panel     Status: Abnormal   Collection Time: 07/01/18  6:37 AM  Result Value Ref Range   Sodium 136 135 - 145 mmol/L   Potassium 4.0 3.5 - 5.1 mmol/L   Chloride 109 98 - 111 mmol/L   CO2 21 (L) 22 - 32 mmol/L   Glucose, Bld 114 (H) 70 - 99 mg/dL   BUN 16 6 - 20 mg/dL   Creatinine, Ser 3.00 0.61 - 1.24 mg/dL   Calcium 8.8 (L) 8.9 - 10.3 mg/dL   GFR calc non Af Amer >60 >60 mL/min   GFR calc Af Amer >60 >60 mL/min   Anion gap 6 5 - 15    Comment: Performed at [facility], [address], [place], [place] [address]    Blood Alcohol level:  Lab Results  Component Value Date   ETH <10 03/12/2018   ETH <10 06/12/2017    Metabolic Disorder Labs: Lab Results  Component Value Date   HGBA1C 5.1 06/27/2018   MPG 100 06/27/2018   MPG 96.8 03/20/2018   Lab Results  Component Value Date   PROLACTIN 14.2 11/17/2017   PROLACTIN 6.3 09/09/2016   Lab Results  Component Value Date   CHOL 185 06/27/2018   TRIG 89 06/27/2018   HDL 44 06/27/2018   CHOLHDL 4.2 06/27/2018   VLDL 18 06/27/2018   LDLCALC 123 (H) 06/27/2018   LDLCALC 100 (H) 03/20/2018    Physical Findings: AIMS: Facial and Oral Movements Muscles of  Facial Expression: None, normal Lips and Perioral Area: None, normal Jaw: None, normal Tongue: None, normal,Extremity Movements Upper (arms, wrists, hands, fingers): None, normal Lower (legs, knees, ankles, toes): None, normal, Trunk Movements Neck, shoulders, hips: None, normal, Overall Severity Severity of abnormal movements (highest score from questions above): None, normal Incapacitation due to abnormal movements: None, normal Patient's awareness of abnormal movements (rate only patient's report): No Awareness, Dental Status Current problems with teeth and/or dentures?: No Does patient usually wear dentures?: No  CIWA:    COWS:     Musculoskeletal: Strength & Muscle Tone: within normal limits Gait & Station: normal Patient leans: N/A  Psychiatric Specialty Exam: Physical Exam  Nursing note and vitals reviewed. Constitutional: She is oriented to person, place, and time. She appears well-developed and well-nourished.  Cardiovascular: Normal rate.  Respiratory: Effort normal.  Neurological: She is alert and oriented to person, place, and time.    Review of Systems  Constitutional: Negative.   Psychiatric/Behavioral: Positive for depression and substance abuse (hx cocaine, opioids). Negative for hallucinations, memory loss and suicidal ideas. The patient is not nervous/anxious and does not have insomnia.     Blood pressure 127/78, pulse 83, temperature 97.6 F (36.4 C), temperature source Oral, resp. rate 16, height 5\' 8"  (1.727 m), weight 94.6 kg, SpO2 96 %.Body mass index is 31.72 kg/m.  General Appearance: Casual  Eye Contact:  Good  Speech:  Clear and Coherent and Normal Rate  Volume:  Decreased  Mood:  Depressed  Affect:  Constricted  Thought Process:  Coherent  Orientation:  Full (Time, Place, and Person)  Thought Content:  WDL  Suicidal  Thoughts:  Yes.  without intent/plan  Homicidal Thoughts:  No  Memory:  Immediate;   Good Recent;   Good  Judgement:  Fair   Insight:  Fair  Psychomotor Activity:  Normal  Concentration:  Concentration: Good  Recall:  Good  Fund of Knowledge:  Fair  Language:  Good  Akathisia:  No  Handed:  Right  AIMS (if indicated):     Assets:  Communication Skills Desire for Improvement Leisure Time Resilience  ADL's:  Intact  Cognition:  WNL  Sleep:  Number of Hours: 6.75     Treatment Plan Summary: Daily contact with patient to assess and evaluate symptoms and progress in treatment and Medication management   Continue inpatient hospitalization.  Continue Abilify 5 mg PO daily for mood Continue Lexapro 20 mg PO daily for mood Continue Remeron 15 mg PO QHS for mood Continue prazosin 1 mg PO QHS for nightmares Continue Vistaril 25 mg PO TID PRN anxiety Continue estradiol 2 mg PO TID for gender transition Continue Proscar 2.5 mg PO daily for gender transition Continue spironolactone 50 mg PO daily for gender transition  Patient will participate in the therapeutic group milieu.  Discharge disposition in progress.   Aldean BakerJanet E Sykes, NP 07/01/2018, 1:12 PM   ..Agree with NP Progress Note

## 2018-07-01 NOTE — Progress Notes (Signed)
Pt presents with a flat affect and a depressed mood. Pt rated on her self inventory depression 4/10, anxiety 6/10, and hopelessness 4/10. Pt wrote passive SI on self inventory sheet. Pt denied active SI with wrtier and verbally contracted for safety. Pt reported fair sleep last night. Pt compliant with taking meds and denies any side effects.  Medications reviewed with pt. Verbal support provided. Pt encouraged to attend groups. 15 minute checks performed for safety.  Pt compliant with tx plan. Pt stated goal "more work on discharge plan".

## 2018-07-02 ENCOUNTER — Encounter (HOSPITAL_COMMUNITY): Payer: Self-pay | Admitting: Behavioral Health

## 2018-07-02 NOTE — Progress Notes (Signed)
[facility]  07/02/2018 9:45 AM Randy [name]  MRN:  [id number]  Subjective: reports feeling better than on admission, denies suicidal ideations, denies medication side effects, presenting more future oriented.  Evaluation: Patient seen for follow-up evaluation Chart reviewed. In brief; this is a 48 year old transgender male, known to unit from prior psychiatric admissions.  History of depression, history of cocaine use disorder, patient presented for depression, suicidal ideations, relapse on cocaine.  Limited support network and relationship stressors with roommate have been contributors to worsening depression.   During this evaluation, patient is alert , attentive, calm and cooperative.  Patient endorses improving mood and less anxiety.  Currently denies suicidal ideations , does report intermittent fleeting passive SI,  although denies any plan or intent. Further describes thoughts as passive. Currently she contracts for safety on the unit.  As she improves she is focusing more on disposition planning.  Plans to  go live with a friend until she is financially stable to find an apartment.  No disruptive or agitated behaviors on unit.  Tolerating medications well.    Principal Problem: MDD (major depressive disorder), severe (HCC) Diagnosis: Principal Problem:   MDD (major depressive disorder), severe (HCC)  Total Time spent with patient: 15 minutes  Past Psychiatric History: See admission H&P  Past Medical History:  Past Medical History:  Diagnosis Date  . Cellulitis  Of  Left Forearm 01/10/2012   From IVDA  . Depression   . Hepatitis C   . 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 I&D EXTREMITY  01/13/2012   Procedure: IRRIGATION AND DEBRIDEMENT EXTREMITY;  Surgeon: Kennieth RadArthur F Carter, MD;  Location: [facility];  Service: Orthopedics;  Laterality: Left;   Family History:  Family History  Problem Relation Age  of Onset  . Idiopathic pulmonary fibrosis Father   . Endocrine Disorder Neg Hx    Family Psychiatric  History: See admission H&P Social History:  Social History   Substance and Sexual Activity  Alcohol Use Yes   Comment: occ     Social History   Substance and Sexual Activity  Drug Use Yes  . Types: Heroin, "Crack" cocaine, Marijuana   Comment: heroin last used 07/14/14, crack/ had some a few days ago    Social History   Socioeconomic History  . Marital status: Single    Spouse name: Not on file  . Number of children: Not on file  . Years of education: Not on file  . Highest education level: Not on file  Occupational History  . 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 Financial resource strain: Very hard  . Food insecurity:    Worry: Often true    Inability: Often true  . Transportation needs:    Medical: Yes    Non-medical: Yes  Tobacco Use  . Smoking status: Current Every Day Smoker    Packs/day: 0.50    Years: 20.00    Pack years: 10.00    Types: Cigarettes  . Smokeless tobacco: Never Used  . Tobacco comment: trying to quit-using e-cig some  Substance and Sexual Activity  . Alcohol use: Yes    Comment: occ  . Drug use: Yes    Types: Heroin, "Crack" cocaine, Marijuana    Comment: heroin last used 07/14/14, crack/ had some a few days ago  . Sexual activity: Yes    Partners: Male    Birth control/protection: Condom  Comment: Has been HIV tested in past and has been negative.  Lifestyle  . Physical activity:    Days per week: Patient refused    Minutes per session: Patient refused  . Stress: Rather much  Relationships  . Social connections:    Talks on phone: Patient refused    Gets together: Patient refused    Attends religious service: Patient refused    Active member of club or organization: Patient refused    Attends meetings of clubs or organizations: Patient refused    Relationship status: Patient refused  Other Topics  Concern  . Not on file  Social History Narrative   Transgendered male.  Single.  Lives alone.  Independent.   Additional Social History:    Pain Medications: please see mar Prescriptions: please see mar Over the Counter: please see mar History of alcohol / drug use?: Yes Longest period of sobriety (when/how long): unknown Name of Substance 1: cocaine 1 - Age of First Use: unknown 1 - Amount (size/oz): unknown 1 - Frequency: 1x a week 1 - Duration: ongoing 1 - Last Use / Amount: 2 days ago Name of Substance 2: heroin 2 - Age of First Use: unknown 2 - Amount (size/oz): unknown 2 - Frequency: 1x a week 2 - Duration: ongoing 2 - Last Use / Amount: 2 days ago    Sleep: Good  Appetite:  Good  Current Medications: Current Facility-Administered Medications  Medication Dose Route Frequency Provider Last Rate Last Dose  . acetaminophen (TYLENOL) tablet 650 mg  650 mg Oral Q6H PRN Money, Gerlene Burdock, FNP      . alum & mag hydroxide-simeth (MAALOX/MYLANTA) 200-200-20 MG/5ML suspension 30 mL  30 mL Oral Q4H PRN Money, Gerlene Burdock, FNP      . ARIPiprazole (ABILIFY) tablet 5 mg  5 mg Oral Daily Money, Gerlene Burdock, FNP   5 mg at 07/02/18 0753  . escitalopram (LEXAPRO) tablet 20 mg  20 mg Oral Daily Money, Gerlene Burdock, FNP   20 mg at 07/02/18 0753  . estradiol (ESTRACE) tablet 2 mg  2 mg Oral TID Money, Gerlene Burdock, FNP   1 mg at 07/02/18 0751  . finasteride (PROSCAR) tablet 2.5 mg  2.5 mg Oral Daily Money, Gerlene Burdock, FNP   2.5 mg at 07/02/18 0750  . hydrOXYzine (ATARAX/VISTARIL) tablet 25 mg  25 mg Oral TID PRN Money, Gerlene Burdock, FNP      . magnesium hydroxide (MILK OF MAGNESIA) suspension 30 mL  30 mL Oral Daily PRN Money, Feliz Beam B, FNP      . mirtazapine (REMERON) tablet 15 mg  15 mg Oral QHS Antonieta Pert, MD   15 mg at 07/01/18 2132  . nicotine (NICODERM CQ - dosed in mg/24 hours) patch 21 mg  21 mg Transdermal Daily Money, Gerlene Burdock, FNP   21 mg at 07/02/18 0756  . prazosin (MINIPRESS) capsule 1 mg   1 mg Oral QHS Money, [name], FNP   1 mg at 07/01/18 2132  . spironolactone (ALDACTONE) tablet 50 mg  50 mg Oral Daily Money, Gerlene Burdock, FNP   50 mg at 07/02/18 4827    Lab Results:  Results for orders placed or performed during the hospital encounter of 06/26/18 (from the past 48 hour(s))  Basic metabolic panel     Status: Abnormal   Collection Time: 07/01/18  6:37 AM  Result Value Ref Range   Sodium 136 135 - 145 mmol/L   Potassium 4.0 3.5 - 5.1 mmol/L  Chloride 109 98 - 111 mmol/L   CO2 21 (L) 22 - 32 mmol/L   Glucose, Bld 114 (H) 70 - 99 mg/dL   BUN 16 6 - 20 mg/dL   Creatinine, Ser 7.25 0.61 - 1.24 mg/dL   Calcium 8.8 (L) 8.9 - 10.3 mg/dL   GFR calc non Af Amer >60 >60 mL/min   GFR calc Af Amer >60 >60 mL/min   Anion gap 6 5 - 15    Comment: Performed at [facility], [address], [place], [place] [address]    Blood Alcohol level:  Lab Results  Component Value Date   ETH <10 03/12/2018   ETH <10 06/12/2017    Metabolic Disorder Labs: Lab Results  Component Value Date   HGBA1C 5.1 06/27/2018   MPG 100 06/27/2018   MPG 96.8 03/20/2018   Lab Results  Component Value Date   PROLACTIN 14.2 11/17/2017   PROLACTIN 6.3 09/09/2016   Lab Results  Component Value Date   CHOL 185 06/27/2018   TRIG 89 06/27/2018   HDL 44 06/27/2018   CHOLHDL 4.2 06/27/2018   VLDL 18 06/27/2018   LDLCALC 123 (H) 06/27/2018   LDLCALC 100 (H) 03/20/2018    Physical Findings: AIMS: Facial and Oral Movements Muscles of Facial Expression: None, normal Lips and Perioral Area: None, normal Jaw: None, normal Tongue: None, normal,Extremity Movements Upper (arms, wrists, hands, fingers): None, normal Lower (legs, knees, ankles, toes): None, normal, Trunk Movements Neck, shoulders, hips: None, normal, Overall Severity Severity of abnormal movements (highest score from questions above): None, normal Incapacitation due to abnormal movements: None, normal Patient's  awareness of abnormal movements (rate only patient's report): No Awareness, Dental Status Current problems with teeth and/or dentures?: No Does patient usually wear dentures?: No  CIWA:    COWS:     Musculoskeletal: Strength & Muscle Tone: within normal limits Gait & Station: normal Patient leans: N/A  Psychiatric Specialty Exam: Physical Exam  Nursing note and vitals reviewed. Constitutional: She is oriented to person, place, and time. She appears well-developed and well-nourished.  Cardiovascular: Normal rate.  Respiratory: Effort normal.  Neurological: She is alert and oriented to person, place, and time.    Review of Systems  Constitutional: Negative.   Psychiatric/Behavioral: Positive for depression and substance abuse (hx cocaine, opioids). Negative for hallucinations, memory loss and suicidal ideas. The patient is nervous/anxious. The patient does not have insomnia.   No chest pain, no shortness of breath, no vomiting  Blood pressure (!) 122/103, pulse 63, temperature (!) 97.4 F (36.3 C), resp. rate 16, height 5\' 8"  (1.727 m), weight 94.6 kg, SpO2 99 %.Body mass index is 31.72 kg/m.  General Appearance: Casual  Eye Contact:  Good  Speech:  Normal Rate  Volume:  Normal  Mood:  Improving compared to admission   Affect:  Toni Arthurs in range, less constricted  Thought Process:  Coherent and Descriptions of Associations: Intact  Orientation:  Other:  Fully alert and attentive  Thought Content:  No hallucinations, no delusions  Suicidal Thoughts:  No currently denies suicidal or self-injurious ideations  Homicidal Thoughts:  No  Memory:  Recent and remote grossly intact  Judgement: Improving  Insight:  Fair/improving  Psychomotor Activity:  Decreased  Concentration:  Concentration: Good  Recall:  Good  Fund of Knowledge:  Good  Language:  Good  Akathisia:  No  Handed:  Right  AIMS (if indicated):     Assets:  Communication Skills Desire for Improvement Leisure  Time  Resilience  ADL's:  Intact  Cognition:  WNL  Sleep:  Number of Hours: 5.75   Assessment: In brief; this is a 48 year old transgender male, known to unit from prior psychiatric admissions.  History of depression, history of cocaine use disorder, patient presented for depression, suicidal ideations, relapse on cocaine.  Limited support network and relationship stressors with roommate have been contributors to worsening depression.   Patient presents with partially/gradually improving mood, full range of affect, today denies SI, presents more future oriented, expressing interest to stay with a friend for a brief period of time until she can find a place to rent.  At this time tolerating medications well, does not endorse side effects.  Treatment Plan Summary: Daily contact with patient to assess and evaluate symptoms and progress in treatment and Medication management  Reviewed current treatment plan 07/02/2018.  Encourage group and milieu participation to work on Pharmacologist and symptom reduction Encourage efforts to work on sobriety and relapse prevention  Continue Abilify 5 mg PO daily for mood Continue Lexapro 20 mg PO daily for mood Continue Remeron 15 mg PO QHS for mood Continue Prazosin 1 mg PO QHS for nightmares Continue Vistaril 25 mg PO TID PRN anxiety Continue Estradiol 2 mg PO TID for gender transition Continue Proscar 2.5 mg PO daily for gender transition Continue Spironolactone 50 mg PO daily for gender transition    F Cobos MD Patient ID: Randy [name], adult   DOB: [birth year], 48 y.o.   MRN: [id number]

## 2018-07-02 NOTE — [facility] Group Notes (Signed)
Adult Psychoeducational Group Note  Date:  07/02/2018 Time:  9:55 PM  Group Topic/Focus:  Wrap-Up Group:   The focus of this group is to help patients review their daily goal of treatment and discuss progress on daily workbooks.  Participation Level:  Active  Participation Quality:  Appropriate and Attentive  Affect:  Appropriate  Cognitive:  Alert and Appropriate  Insight: Appropriate and Good  Engagement in Group:  Engaged  Modes of Intervention:  Discussion and Education  Additional Comments:  Pt attended and participated in wrap up group this evening. Pt rated their day a 7/10, due to them feeling better and being ready for discharge. Pt completed their goal to work on their discharge plan.   Chrisandra Netters 07/02/2018, 9:55 PM

## 2018-07-02 NOTE — [facility] Group Notes (Signed)
LCSW Group Therapy Note  07/02/2018    10:00-11:00am   Type of Therapy and Topic:  Group Therapy: Early Messages Received About Anger  Participation Level:  Active   Description of Group:   In this group, patients shared and discussed the early messages received in their lives about anger through parental or other adult modeling, teaching, repression, punishment, violence, and more.  Participants identified how those childhood lessons influence even now how they usually or often react when angered.  The group discussed that anger is a secondary emotion and what may be the underlying emotional themes that come out through anger outbursts or that are ignored through anger suppression.  Finally, as a group there was a conversation about the workbook's quote that "There is nothing wrong with anger; it is just a sign something needs to change."     Therapeutic Goals: 1. Patients will identify one or more childhood message about anger that they received and how it was taught to them. 2. Patients will discuss how these childhood experiences have influenced and continue to influence their own expression or repression of anger even today. 3. Patients will explore possible primary emotions that tend to fuel their secondary emotion of anger. 4. Patients will learn that anger itself is normal and cannot be eliminated, and that healthier coping skills can assist with resolving conflict rather than worsening situations.  Summary of Patient Progress:  The patient shared that her childhood lessons about anger were that anger was to be expressed behind closed doors, because it is "not acceptable."  As a result, she tries to ignore her anger and will push it down until it explodes.  Therapeutic Modalities:   Cognitive Behavioral Therapy Motivation Interviewing  Lynnell Chad  .

## 2018-07-02 NOTE — Plan of Care (Signed)
D: Patient presents with flat, blunted affect; her smile is superficial.  Patient slept in this morning, instead of attending group.  She rates her depression and hopelessness as a 5; anxiety as a 4.  Her goal today is to "communicate discharge plans."  Patient denies any thoughts of self harm.  She is sleeping and eating well; her energy level is low, and her concentration is poor.  Patient is a possible discharge for Monday.  A: Continue to monitor medication management and MD orders.  Safety checks completed every 15 minutes per protocol.  Offer support and encouragement as needed.  R: Patient is receptive to staff; her behavior is appropriate.    Problem: Education: Goal: Mental status will improve Outcome: Progressing   Problem: Activity: Goal: Interest or engagement in activities will improve Outcome: Not Progressing Goal: Sleeping patterns will improve Outcome: Not Progressing   Problem: Education: Goal: Knowledge of [facility] Education information/materials will improve Outcome: Adequate for Discharge Goal: Emotional status will improve Outcome: Adequate for Discharge Goal: Verbalization of understanding the information provided will improve Outcome: Adequate for Discharge

## 2018-07-02 NOTE — Progress Notes (Signed)
D: Pt denies SI/HI/AVH. Pt is pleasant and cooperative. Pt stated she was doing better, pt plans to stay with a friend on D/C until she finds a place to stay.  A: Pt was offered support and encouragement. Pt was given scheduled medications. Pt was encourage to attend groups. Q 15 minute checks were done for safety.  R:Pt attends groups and interacts well with peers and staff. Pt is taking medication. Pt has no complaints.Pt receptive to treatment and safety maintained on unit.  Problem: Activity: Goal: Sleeping patterns will improve Outcome: Progressing   Problem: Safety: Goal: Periods of time without injury will increase Outcome: Progressing   Problem: Coping: Goal: Coping ability will improve Outcome: Progressing   Problem: Coping: Goal: Will verbalize feelings Outcome: Progressing   Problem: Health Behavior/Discharge Planning: Goal: Ability to make decisions will improve Outcome: Progressing

## 2018-07-02 NOTE — [facility] Group Notes (Signed)
[facility] Group Notes:  (Nursing)  Date:  07/02/2018  Time:1:15 PM Type of Therapy:  Nurse Education  Participation Level:  Active  Participation Quality:  Appropriate  Affect:  Appropriate  Cognitive:  Appropriate  Insight:  Appropriate  Engagement in Group:  Engaged  Modes of Intervention:  Education  Summary of Progress/Problems: Identifying Needs  Shela Nevin 07/02/2018, 6:06 PM

## 2018-07-03 ENCOUNTER — Encounter (HOSPITAL_COMMUNITY): Payer: Self-pay | Admitting: Behavioral Health

## 2018-07-03 NOTE — Plan of Care (Signed)
  Problem: Coping: °Goal: Ability to demonstrate self-control will improve °Outcome: Progressing °  °Problem: Health Behavior/Discharge Planning: °Goal: Compliance with treatment plan for underlying cause of condition will improve °Outcome: Progressing °  °

## 2018-07-03 NOTE — [facility] Group Notes (Signed)
[facility] LCSW Group Therapy Note  07/03/2018   10:00-11:00AM  Type of Therapy and Topic:  Group Therapy:  Unhealthy versus Healthy Supports, Which Am I?  Participation Level:  Active   Description of Group:  Patients in this group were introduced to the concept that additional supports including self-support are an essential part of recovery.  Initially a discussion was held about the differences between healthy versus unhealthy supports.  Patients were asked to share what unhealthy supports in their lives need to be addressed, as well as what additional healthy supports could be added for greater help in reaching their goals.   A song entitled "My Own Hero" was played and a group discussion ensued in which patients stated they could relate to the song and it inspired them to realize they have be willing to help themselves in order to succeed, because other people cannot achieve sobriety or stability for them.  We discussed adding a variety of healthy supports to address the various needs in patient lives, including becoming more self-supportive.  Therapeutic Goals: 1)  Highlight the differences between healthy and unhealthy supports 2)  Suggest the importance of being a part of one's own support system 2)  Discuss reasons people in one's life may eventually be unable to be continually supportive  3)  Identify the patient's current support system and   4)  elicit commitments to add healthy supports and to become more conscious of being self-supportive   Summary of Patient Progress:  The patient expressed that the unhealthy support which needs to be addressed includes the person she was staying with, who would actively discourage her from moving into her own place and then would drink in front of her a lot.  Because she was leaning on her entirely, it made it possible for patient to relapse and not reach toward her goals..  Healthy supports which could be added for increased stability and happiness  include going to stay with a friend at discharge who has excellent boundaries and will only let her stay a short period of time before expecting her to be able to go out into her own apartment.  Therapeutic Modalities:   Motivational Interviewing Activity  Lynnell Chad

## 2018-07-03 NOTE — Progress Notes (Signed)
Pt presents with an anxious mood. Pt stated on her self inventory sheet: depression 4/10, anxiety 5/10, and hopelessness 4/10. Pt denied SI/HI. Pt reported fair sleep last night. Pt reported having a fair appetite today. No concerns verbalized by pt. Pt compliant with attending scheduled groups. Pt expressed readiness for discharge tomorrow and stated that she have a safe place to go.   Medications reviewed with pt. Verbal support provided. Pt encouraged to attend groups. 15 minute checks performed for safety.   Pt compliant with taking meds and denies any side effects.

## 2018-07-03 NOTE — Progress Notes (Signed)
Patient has been up and active on the unit, attended group this evening and has voiced no complaints.She is looking forward to discharging on tomorrow. Patient currently denies having pain, -si/hi/a/v hall. Support and encouragement offered, safety maintained on unit, will continue to monitor.  

## 2018-07-03 NOTE — Progress Notes (Addendum)
[facility] MD Progress Note  07/03/2018 9:37 AM Randy [name]  MRN:  [id number]  Subjective: Patient describes further improvement compared to admission presentation, today denies suicidal ideations, presents future oriented and more focused on disposition planning.  Does not endorse medication side effects.  Evaluation: Patient seen for follow-up evaluation. Chart reviewed. 48 year old transgender male, known to unit from prior psychiatric admissions.  History of depression, history of cocaine use disorder, patient presented for depression, suicidal ideations, relapse on cocaine.  Limited support network and relationship stressors with roommate have been contributors to worsening depression.   During this evaluation, patient is alert , attentive, calm and cooperative.  Patient continues to endorse gradually improving mood and presents with a more reactive, less depressed affect and less anxiety .  History of intermittent suicidal ideations and passive SI thoughts, today she denies suicidal thoughts, active or passive,  Her main stressor remains her living situation although she is planning to live with a friend following discharge, while she awaits check/monies with which she hopes she will be able to afford a place of her own No disruptive or agitated behaviors on unit and has been more visible in milieu , although still noted to spend most time in her room .  She is tolerating medications well, denies side effects.   Principal Problem: MDD (major depressive disorder), severe (HCC) Diagnosis: Principal Problem:   MDD (major depressive disorder), severe (HCC)  Total Time spent with patient: 15 minutes  Past Psychiatric History: See admission H&P  Past Medical History:  Past Medical History:  Diagnosis Date  . Cellulitis  Of  Left Forearm 01/10/2012   From IVDA  . Depression   . Hepatitis C   . 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 I&D EXTREMITY  01/13/2012   Procedure: IRRIGATION AND DEBRIDEMENT EXTREMITY;  Surgeon: Kennieth Rad, MD;  Location: [facility];  Service: Orthopedics;  Laterality: Left;   Family History:  Family History  Problem Relation Age of Onset  . Idiopathic pulmonary fibrosis Father   . Endocrine Disorder Neg Hx    Family Psychiatric  History: See admission H&P Social History:  Social History   Substance and Sexual Activity  Alcohol Use Yes   Comment: occ     Social History   Substance and Sexual Activity  Drug Use Yes  . Types: Heroin, "Crack" cocaine, Marijuana   Comment: heroin last used 07/14/14, crack/ had some a few days ago    Social History   Socioeconomic History  . Marital status: Single    Spouse name: Not on file  . Number of children: Not on file  . Years of education: Not on file  . Highest education level: Not on file  Occupational History  . 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 Financial resource strain: Very hard  . Food insecurity:    Worry: Often true    Inability: Often true  . Transportation needs:    Medical: Yes    Non-medical: Yes  Tobacco Use  . Smoking status: Current Every Day Smoker    Packs/day: 0.50    Years: 20.00    Pack years: 10.00    Types: Cigarettes  . Smokeless tobacco: Never Used  . Tobacco comment: trying to quit-using e-cig some  Substance and Sexual Activity  . Alcohol use: Yes    Comment: occ  . Drug use: Yes    Types:  Heroin, "Crack" cocaine, Marijuana    Comment: heroin last used 07/14/14, crack/ had some a few days ago  . Sexual activity: Yes    Partners: Male    Birth control/protection: Condom    Comment: Has been HIV tested in past and has been negative.  Lifestyle  . Physical activity:    Days per week: Patient refused    Minutes per session: Patient refused  . Stress: Rather much  Relationships  . Social connections:    Talks on phone: Patient refused    Gets together:  Patient refused    Attends religious service: Patient refused    Active member of club or organization: Patient refused    Attends meetings of clubs or organizations: Patient refused    Relationship status: Patient refused  Other Topics Concern  . Not on file  Social History Narrative   Transgendered male.  Single.  Lives alone.  Independent.   Additional Social History:    Pain Medications: please see mar Prescriptions: please see mar Over the Counter: please see mar History of alcohol / drug use?: Yes Longest period of sobriety (when/how long): unknown Name of Substance 1: cocaine 1 - Age of First Use: unknown 1 - Amount (size/oz): unknown 1 - Frequency: 1x a week 1 - Duration: ongoing 1 - Last Use / Amount: 2 days ago Name of Substance 2: heroin 2 - Age of First Use: unknown 2 - Amount (size/oz): unknown 2 - Frequency: 1x a week 2 - Duration: ongoing 2 - Last Use / Amount: 2 days ago    Sleep: Good  Appetite:  Good  Current Medications: Current Facility-Administered Medications  Medication Dose Route Frequency Provider Last Rate Last Dose  . acetaminophen (TYLENOL) tablet 650 mg  650 mg Oral Q6H PRN Money, Gerlene Burdock, FNP      . alum & mag hydroxide-simeth (MAALOX/MYLANTA) 200-200-20 MG/5ML suspension 30 mL  30 mL Oral Q4H PRN Money, Gerlene Burdock, FNP      . ARIPiprazole (ABILIFY) tablet 5 mg  5 mg Oral Daily Money, Gerlene Burdock, FNP   5 mg at 07/03/18 0854  . escitalopram (LEXAPRO) tablet 20 mg  20 mg Oral Daily Money, Gerlene Burdock, FNP   20 mg at 07/03/18 0855  . estradiol (ESTRACE) tablet 2 mg  2 mg Oral TID Money, Gerlene Burdock, FNP   2 mg at 07/03/18 0854  . finasteride (PROSCAR) tablet 2.5 mg  2.5 mg Oral Daily Money, Gerlene Burdock, FNP   2.5 mg at 07/03/18 1610  . hydrOXYzine (ATARAX/VISTARIL) tablet 25 mg  25 mg Oral TID PRN Money, Gerlene Burdock, FNP      . magnesium hydroxide (MILK OF MAGNESIA) suspension 30 mL  30 mL Oral Daily PRN Money, Feliz Beam B, FNP      . mirtazapine (REMERON)  tablet 15 mg  15 mg Oral QHS Antonieta Pert, MD   15 mg at 07/02/18 2102  . nicotine (NICODERM CQ - dosed in mg/24 hours) patch 21 mg  21 mg Transdermal Daily Money, Gerlene Burdock, FNP   21 mg at 07/03/18 0856  . prazosin (MINIPRESS) capsule 1 mg  1 mg Oral QHS Money, [name], FNP   1 mg at 07/02/18 2102  . spironolactone (ALDACTONE) tablet 50 mg  50 mg Oral Daily Money, Gerlene Burdock, FNP   50 mg at 07/03/18 9604    Lab Results:  No results found for this or any previous visit (from the past 48 hour(s)).  Blood Alcohol level:  Lab Results  Component Value Date   ETH <10 03/12/2018   ETH <10 06/12/2017    Metabolic Disorder Labs: Lab Results  Component Value Date   HGBA1C 5.1 06/27/2018   MPG 100 06/27/2018   MPG 96.8 03/20/2018   Lab Results  Component Value Date   PROLACTIN 14.2 11/17/2017   PROLACTIN 6.3 09/09/2016   Lab Results  Component Value Date   CHOL 185 06/27/2018   TRIG 89 06/27/2018   HDL 44 06/27/2018   CHOLHDL 4.2 06/27/2018   VLDL 18 06/27/2018   LDLCALC 123 (H) 06/27/2018   LDLCALC 100 (H) 03/20/2018    Physical Findings: AIMS: Facial and Oral Movements Muscles of Facial Expression: None, normal Lips and Perioral Area: None, normal Jaw: None, normal Tongue: None, normal,Extremity Movements Upper (arms, wrists, hands, fingers): None, normal Lower (legs, knees, ankles, toes): None, normal, Trunk Movements Neck, shoulders, hips: None, normal, Overall Severity Severity of abnormal movements (highest score from questions above): None, normal Incapacitation due to abnormal movements: None, normal Patient's awareness of abnormal movements (rate only patient's report): No Awareness, Dental Status Current problems with teeth and/or dentures?: No Does patient usually wear dentures?: No  CIWA:    COWS:     Musculoskeletal: Strength & Muscle Tone: within normal limits Gait & Station: normal Patient leans: N/A  Psychiatric Specialty Exam: Physical Exam   Nursing note and vitals reviewed. Constitutional: She is oriented to person, place, and time. She appears well-developed and well-nourished.  Cardiovascular: Normal rate.  Respiratory: Effort normal.  Neurological: She is alert and oriented to person, place, and time.    Review of Systems  Constitutional: Negative.   Psychiatric/Behavioral: Positive for depression and substance abuse (hx cocaine, opioids). Negative for hallucinations, memory loss and suicidal ideas. The patient is nervous/anxious. The patient does not have insomnia.   No chest pain, no shortness of breath, no vomiting  Blood pressure 120/84, pulse (!) 106, temperature 97.7 F (36.5 C), temperature source Oral, resp. rate 16, height 5\' 8"  (1.727 m), weight 94.6 kg, SpO2 99 %.Body mass index is 31.72 kg/m.  General Appearance: Improving grooming  Eye Contact:  Good  Speech:  Normal Rate  Volume:  Normal  Mood:  Improving mood   Affect:  More reactive  Thought Process:  Linear and Descriptions of Associations: Intact  Orientation:  Other:  Fully alert and attentive  Thought Content:  No hallucinations, no delusions  Suicidal Thoughts:  No currently denies suicidal or self-injurious ideations  Homicidal Thoughts:  No  Memory:  Recent and remote grossly intact  Judgement: Improving  Insight:  Fair/improving  Psychomotor Activity:  Decreased  Concentration:  Concentration: Good  Recall:  Good  Fund of Knowledge:  Good  Language:  Good  Akathisia:  No  Handed:  Right  AIMS (if indicated):     Assets:  Communication Skills Desire for Improvement Leisure Time Resilience  ADL's:  Intact  Cognition:  WNL  Sleep:  Number of Hours: 5.5   Assessment: In brief; this is a 48 year old transgender male, known to unit from prior psychiatric admissions.  History of depression, history of cocaine use disorder, patient presented for depression, suicidal ideations, relapse on cocaine.  Limited support network and  relationship stressors with roommate have been contributors to worsening depression.   At this time patient presents with improvement compared to initial presentation.  Over the last day or 2 her affect is noted to have improved noticeably, and reports feeling better, denies suicidal ideations at present,  and presents future oriented, focusing mostly on housing issues following discharge.  Tolerating current medication regimen well.    Treatment Plan Summary: Daily contact with patient to assess and evaluate symptoms and progress in treatment and Medication management  Reviewed current treatment plan 07/03/2018.  Encourage group and milieu participation to work on Pharmacologist and symptom reduction Encourage efforts to work on sobriety and relapse prevention  Continue Abilify 5 mg PO daily for mood Continue Lexapro 20 mg PO daily for mood Continue Remeron 15 mg PO QHS for mood Continue Prazosin 1 mg PO QHS for nightmares Continue Vistaril 25 mg PO TID PRN anxiety Continue Estradiol 2 mg PO TID for gender transition Continue Proscar 2.5 mg PO daily for gender transition Continue Spironolactone 50 mg PO daily for gender transition    F Cobos MD  Patient ID: Randy [name], adult   DOB: 1970-06-13, 48 y.o.   MRN: [id number]

## 2018-07-03 NOTE — Progress Notes (Signed)
Date:  07/03/2018  Time:  1300  Type of Therapy:  Nurse Education/Shalita, RN  Participation Level:  Did not attend

## 2018-07-04 MED ORDER — FINASTERIDE 5 MG PO TABS: 2.5000 mg | ORAL_TABLET | Freq: Every day | ORAL | Status: DC

## 2018-07-04 MED ORDER — SPIRONOLACTONE 50 MG PO TABS: 50.0000 mg | ORAL_TABLET | Freq: Every day | ORAL | 0 refills | Status: DC

## 2018-07-04 MED ORDER — ARIPIPRAZOLE 5 MG PO TABS: 5.0000 mg | ORAL_TABLET | Freq: Every day | ORAL | 0 refills | Status: DC

## 2018-07-04 MED ORDER — ESCITALOPRAM OXALATE 20 MG PO TABS: 20.0000 mg | ORAL_TABLET | Freq: Every day | ORAL | 0 refills | Status: DC

## 2018-07-04 MED ORDER — ESTRADIOL 2 MG PO TABS: 2.0000 mg | ORAL_TABLET | Freq: Three times a day (TID) | ORAL | 0 refills | Status: DC

## 2018-07-04 MED ORDER — MIRTAZAPINE 15 MG PO TABS: 15.0000 mg | ORAL_TABLET | Freq: Every day | ORAL | 0 refills | Status: DC

## 2018-07-04 MED ORDER — PRAZOSIN HCL 1 MG PO CAPS: 1.0000 mg | ORAL_CAPSULE | Freq: Every day | ORAL | 0 refills | Status: DC

## 2018-07-04 MED ORDER — NICOTINE 21 MG/24HR TD PT24: 21.0000 mg | MEDICATED_PATCH | Freq: Every day | TRANSDERMAL | 0 refills | Status: DC

## 2018-07-04 MED ORDER — HYDROXYZINE HCL 25 MG PO TABS: 25.0000 mg | ORAL_TABLET | Freq: Three times a day (TID) | ORAL | 0 refills | Status: DC | PRN

## 2018-07-04 NOTE — Discharge Summary (Addendum)
Physician Discharge Summary Note  Patient:  Randy [name] is an 48 y.o., adult MRN:  [id number] DOB:  17-Apr-1971 Patient phone:  [phone] (home)  Patient address:   [address] [place] [address],  Total Time spent with patient: 30 minutes  Date of Admission:  06/26/2018  Date of Discharge: 07/04/2018  Reason for Admission:    Principal Problem: MDD (major depressive disorder), severe [facility])  Discharge Diagnoses: Patient Active Problem List   Diagnosis Date Noted  . MDD (major depressive disorder), severe (HCC) [F32.2] 06/26/2018  . Male-to-male transgender person [F64.0] 06/21/2018  . Severe recurrent major depression without psychotic features (HCC) [F33.2] 11/16/2017  . Liver fibrosis [K74.0] 06/22/2017  . MDD (major depressive disorder), single episode, severe , no psychosis (HCC) [F32.2] 06/11/2017  . Cough [R05] 03/14/2015  . Major depressive disorder, recurrent, severe without psychotic features (HCC) [F33.2]   . Opioid dependence with opioid-induced mood disorder (HCC) [F11.24]   . Cocaine dependence with cocaine-induced mood disorder (HCC) [F14.24]   . MDD (major depressive disorder) [F32.9] 09/17/2014  . MDD (major depressive disorder), recurrent severe, without psychosis (HCC) [F33.2] 07/05/2014  . Major depression, recurrent (HCC) [F33.9] 04/10/2012  . Cocaine dependence (HCC) [F14.20] 04/10/2012  . Polysubstance abuse including IVDA (heroin), cocaine, marijuana [F19.10] 01/10/2012  . Elevated LFTs [R94.5] 01/10/2012  . Chronic hepatitis C without hepatic coma (HCC) [B18.2] 01/10/2012  . 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 Tobacco abuse [Z72.0] 01/10/2012  . Anxiety disorder [F41.9] 08/14/2011  . Recurrent major depression-severe (HCC) [F33.2] 08/13/2011   Past Psychiatric History: See H&P  Past Medical History:  Past Medical History:  Diagnosis Date  . Cellulitis  Of  Left Forearm 01/10/2012   From IVDA  .  Depression   . Hepatitis C   . 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 I&D EXTREMITY  01/13/2012   Procedure: IRRIGATION AND DEBRIDEMENT EXTREMITY;  Surgeon: Kennieth Rad, MD;  Location: [facility];  Service: Orthopedics;  Laterality: Left;   Family History:  Family History  Problem Relation Age of Onset  . Idiopathic pulmonary fibrosis Father   . Endocrine Disorder Neg Hx    Family Psychiatric  History: See H&P Social History:  Social History   Substance and Sexual Activity  Alcohol Use Yes   Comment: occ     Social History   Substance and Sexual Activity  Drug Use Yes  . Types: Heroin, "Crack" cocaine, Marijuana   Comment: heroin last used 07/14/14, crack/ had some a few days ago    Social History   Socioeconomic History  . Marital status: Single    Spouse name: Not on file  . Number of children: Not on file  . Years of education: Not on file  . Highest education level: Not on file  Occupational History  . 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 Financial resource strain: Very hard  . Food insecurity:    Worry: Often true    Inability: Often true  . Transportation needs:    Medical: Yes    Non-medical: Yes  Tobacco Use  . Smoking status: Current Every Day Smoker    Packs/day: 0.50    Years: 20.00    Pack years: 10.00    Types: Cigarettes  . Smokeless tobacco: Never Used  . Tobacco comment: trying to quit-using e-cig some  Substance and Sexual Activity  .  Alcohol use: Yes    Comment: occ  . Drug use: Yes    Types: Heroin, "Crack" cocaine, Marijuana    Comment: heroin last used 07/14/14, crack/ had some a few days ago  . Sexual activity: Yes    Partners: Male    Birth control/protection: Condom    Comment: Has been HIV tested in past and has been negative.  Lifestyle  . Physical activity:    Days per week: Patient refused    Minutes per session: Patient refused   . Stress: Rather much  Relationships  . Social connections:    Talks on phone: Patient refused    Gets together: Patient refused    Attends religious service: Patient refused    Active member of club or organization: Patient refused    Attends meetings of clubs or organizations: Patient refused    Relationship status: Patient refused  Other Topics Concern  . Not on file  Social History Narrative   Transgendered male.  Single.  Lives alone.  Independent.   Hospital Course: (Per Md's admission notes): Patient is a 48 year old male to male transgender patient who presented to the behavioral health hospital as a direct admission on 06/26/2018. The patient had gotten into an argument with her roommate. The patient related that the patient was "mean to me". She stated she had been living with a roommate since December 2019. The relationship had been rocky at best during the entire time. The patient has a history of severe recurrent major depression without psychotic features. She was last admitted to the hospital on 11/3 and discharged on 11/13. She went to [facility] after discharge, and stayed there until December. She had been discharged on Abilify, Lexapro, mirtazapine, prazosin. She has a history of posttraumatic stress disorder as well. She has a history of cocaine dependence and stated that she had not used cocaine until this week but had been sober since December. She also has a history of opioid dependence. She was admitted to the hospital for evaluation and stabilization.  This isone of several discharge summaries from this [facility] alone for [facility]. He has been transitioning from a male to a male gender since he has been coming to this [facility] for mental health care. She is known in this hospital from previous hospitalizations. Jamonta also has hx of polysubstance use disorders. She reported onthis presentadmission that his depression worsened after an argument with a room-mate. He was  seeking mood stabilization treatments.  After evaluation of herpresenting symptoms, it was jointly agreed by the treatment team torecommend Emilyfor mood stabilization treatments. Then,the medication regimentargeting those presenting symptomswere discussed &initiatedwith herconsent. Shewas medicated, stabilized/discharged on themedicationsas listed below. She was also enrolled & participated in the group counseling sessions being offered & held on this unit.She learned coping skills.She presented other significant medical issues that required treatment  & or monitoring.She was resumed & discharged on all herpertinent home medications for those health issues. She tolerated hertreatment regimen without any adverse effects or reactions reported.   Rosie's symptoms responded well to hertreatment regimen. This is evidenced by her reports of improved mood, resolution of symptoms &presentation of good affect/eye contact.Sheiscurrently mentally & medically stable for dischargeto continue mental health careas recommended below.   Today upon her discharge evaluationwith the attending psychiatrist today,Emilyshares, "I'm doing good. I feel much better".She denies any specific concerns.She is sleeping well. Herappetite is good. She denies other physical complaints.She denies SI/HI/AH/VH.She is tolerating hermedications well &in agreement to continue hercurrent regimen as noted on the  discharge medication lists below.She will have to follow up for routine psychiatric care, further substance abuse treatment&medication management as noted below. She is provided with all the necessary information needed to make these appointments without problems. Schafer was able to engage in safety planning including plan to return to [facility] or contact emergency services if she feels unable to maintain herown safety or the safety of others. Pt had no further questions, comments or concerns. She left [facility]  with all personal belongings in no apparent distress.Transportation per the city bus. [facility] assisted with bus pass.  Physical Findings: AIMS: Facial and Oral Movements Muscles of Facial Expression: None, normal Lips and Perioral Area: None, normal Jaw: None, normal Tongue: None, normal,Extremity Movements Upper (arms, wrists, hands, fingers): None, normal Lower (legs, knees, ankles, toes): None, normal, Trunk Movements Neck, shoulders, hips: None, normal, Overall Severity Severity of abnormal movements (highest score from questions above): None, normal Incapacitation due to abnormal movements: None, normal Patient's awareness of abnormal movements (rate only patient's report): No Awareness, Dental Status Current problems with teeth and/or dentures?: No Does patient usually wear dentures?: No  CIWA:    COWS:     Musculoskeletal: Strength & Muscle Tone: within normal limits Gait & Station: normal Patient leans: N/A  Psychiatric Specialty Exam: Physical Exam  Nursing note and vitals reviewed. Constitutional: She appears well-developed.  HENT:  Head: Normocephalic.  Eyes: Pupils are equal, round, and reactive to light.  Neck: Normal range of motion.  Cardiovascular: Normal rate.  Respiratory: Effort normal.  GI: Soft.  Genitourinary:    Genitourinary Comments: Deferred   Musculoskeletal: Normal range of motion.  Neurological: She is alert.  Skin: Skin is warm.    Review of Systems  Constitutional: Negative.  Negative for chills and fever.  HENT: Negative.   Eyes: Negative.   Respiratory: Negative.  Negative for cough and shortness of breath.   Cardiovascular: Negative.  Negative for chest pain and palpitations.  Gastrointestinal: Negative.  Negative for abdominal pain, heartburn, nausea and vomiting.  Genitourinary: Negative.   Musculoskeletal: Negative.  Negative for myalgias.  Skin: Negative.   Neurological: Negative.  Negative for dizziness and headaches.   Endo/Heme/Allergies: Negative.   Psychiatric/Behavioral: Positive for depression (Stabilized with medication prior to discharge) and substance abuse (Hx. Cocaine & opiate use disorder). Negative for hallucinations, memory loss and suicidal ideas. The patient has insomnia (Stabilized with medication prior to discharge). The patient is not nervous/anxious (Stable).     Blood pressure 108/80, pulse 86, temperature 97.6 F (36.4 C), temperature source Oral, resp. rate 16, height 5\' 8"  (1.727 m), weight 94.6 kg, SpO2 99 %.Body mass index is 31.72 kg/m.  See Md's discharge SRA   Have you used any form of tobacco in the last 30 days? (Cigarettes, Smokeless Tobacco, Cigars, and/or Pipes): Yes  Has this patient used any form of tobacco in the last 30 days? (Cigarettes, Smokeless Tobacco, Cigars, and/or Pipes) Yes, an FDA-approved tobacco cessation medication was offered at discharge.  Blood Alcohol level:  Lab Results  Component Value Date   ETH <10 03/12/2018   ETH <10 06/12/2017   Metabolic Disorder Labs:  Lab Results  Component Value Date   HGBA1C 5.1 06/27/2018   MPG 100 06/27/2018   MPG 96.8 03/20/2018   Lab Results  Component Value Date   PROLACTIN 14.2 11/17/2017   PROLACTIN 6.3 09/09/2016   Lab Results  Component Value Date   CHOL 185 06/27/2018   TRIG 89 06/27/2018   HDL 44 06/27/2018  CHOLHDL 4.2 06/27/2018   VLDL 18 06/27/2018   LDLCALC 123 (H) 06/27/2018   LDLCALC 100 (H) 03/20/2018   See Psychiatric Specialty Exam and Suicide Risk Assessment completed by Attending Physician prior to discharge.  Discharge destination:  Home  Is patient on multiple antipsychotic therapies at discharge:  No   Has Patient had three or more failed trials of antipsychotic monotherapy by history:  No  Recommended Plan for Multiple Antipsychotic Therapies: NA  Allergies as of 07/04/2018      Reactions   Penicillins Other (See Comments)   Has patient had a PCN reaction causing  immediate rash, facial/tongue/throat swelling, SOB or lightheadedness with hypotension: no Has patient had a PCN reaction causing severe rash involving mucus membranes or skin necrosis: No Has patient had a PCN reaction that required hospitalization: No Has patient had a PCN reaction occurring within the last 10 years: Yes If all of the above answers are "NO", then may proceed with Cephalosporin use. Swollen Joints      Medication List    STOP taking these medications   Ledipasvir-Sofosbuvir 90-400 MG Tabs Commonly known as:  HARVONI     TAKE these medications     Indication  ARIPiprazole 5 MG tablet Commonly known as:  ABILIFY Take 1 tablet (5 mg total) by mouth daily. For mood control What changed:  additional instructions  Indication:  Mood control   escitalopram 20 MG tablet Commonly known as:  LEXAPRO Take 1 tablet (20 mg total) by mouth daily. For depression What changed:  additional instructions  Indication:  Generalized Anxiety Disorder, Major Depressive Disorder   estradiol 2 MG tablet Commonly known as:  ESTRACE Take 1 tablet (2 mg total) by mouth 3 (three) times daily. For Male to male transition What changed:  additional instructions  Indication:  Gender transition   finasteride 5 MG tablet Commonly known as:  PROSCAR Take 0.5 tablets (2.5 mg total) by mouth daily. For Prostate health Start taking on:  July 05, 2018 What changed:  additional instructions  Indication:  Benign Enlargement of Prostate   hydrOXYzine 25 MG tablet Commonly known as:  ATARAX/VISTARIL Take 1 tablet (25 mg total) by mouth 3 (three) times daily as needed for anxiety.  Indication:  Feeling Anxious   mirtazapine 15 MG tablet Commonly known as:  REMERON Take 1 tablet (15 mg total) by mouth at bedtime. For depression/insomnia What changed:    medication strength  how much to take  additional instructions  Indication:  Major Depressive Disorder, Insomnia   nicotine 21  mg/24hr patch Commonly known as:  NICODERM CQ - dosed in mg/24 hours Place 1 patch (21 mg total) onto the skin daily. (May buy from over the counter): For smoking cessation What changed:  additional instructions  Indication:  Nicotine Addiction   prazosin 1 MG capsule Commonly known as:  MINIPRESS Take 1 capsule (1 mg total) by mouth at bedtime. For PTSD symptoms What changed:  additional instructions  Indication:  Nightmares   spironolactone 50 MG tablet Commonly known as:  ALDACTONE Take 1 tablet (50 mg total) by mouth daily. For gender transition What changed:  additional instructions  Indication:  Gender transition      Follow-up Information    [facility] Follow up on 07/06/2018.   Why:  Hospital follow up appointment is Wednesday, 2/26 at 1:45p. Please bring your photo ID, proof of insurance, current medications and discharge paperwork from this hospitalization.  Contact information: [address] [place] [address] [phone]  Follow-up recommendations: Activity:  As tolerated Diet: As recommended by your primary care doctor. Keep all scheduled follow-up appointments as recommended.   Comments: Patient is instructed prior to discharge to: Take all medications as prescribed by his/her mental healthcare provider. Report any adverse effects and or reactions from the medicines to his/her outpatient provider promptly. Patient has been instructed & cautioned: To not engage in alcohol and or illegal drug use while on prescription medicines. In the event of worsening symptoms, patient is instructed to call the crisis hotline, 911 and or go to the nearest ED for appropriate evaluation and treatment of symptoms. To follow-up with his/her primary care provider for your other medical issues, concerns and or health care needs.    Signed: Armandina Stammer, NP, PMHNP, FNP-BC 07/04/2018, 11:22 AM Patient seen, Suicide Assessment Completed.  Disposition Plan Reviewed

## 2018-07-04 NOTE — Progress Notes (Signed)
D: Pt A & O X 4. Denies SI, HI, AVH and pain at this time. Pt reports he slept fair last night with fair appetite, low energy and poor concentration level. Pt rates her depression 4/10, hopelessness 3/10 and anxiety 3/10 on self inventory sheet. Pt D/C home as ordered. Bus pass (X2) given for transportation at time of d/c. A: D/C instructions reviewed with pt including prescriptions and follow up appointments;  compliance encouraged. All belongings from locker # 30 given to pt at time of departure. Scheduled medications given with verbal education and effects monitored. Safety checks maintained without incident till time of d/c.  R: Pt receptive to care. Compliant with medications when offered. Denies adverse drug reactions when assessed. Verbalized understanding related to d/c instructions. Signed belonging sheet in agreement with items received from locker. Ambulatory with a steady gait. Appears to be in no physical distress at time of departure.

## 2018-07-04 NOTE — Progress Notes (Signed)
Recreation Therapy Notes  Date:  2. 24. 20 Time: 0930 Location: [address] Dayroom  Group Topic: Stress Management  Goal Area(s) Addresses:  Patient will identify positive stress management techniques. Patient will identify benefits of using stress management post d/c.  Intervention: Worksheet  Activity :  Choice Meditation.  LRT introduced the stress management technique of meditation.  LRT played [name] meditation that focused on having [name] choice in changing the way they think about things.  Patients were to follow along as meditation played to engage in activity.  Education:  Stress Management, Discharge Planning.   Education Outcome: Acknowledges Education  Clinical Observations/Feedback: Pt did not attend group.     Randy [name], LRT/CTRS         Randy [name] 07/04/2018 11:09 AM 

## 2018-07-04 NOTE — Progress Notes (Signed)
  [facility] Adult Case Management Discharge Plan :  Will you be returning to the same living situation after discharge:  No. Patient reports he is discharging to a friend's home. He states he plans to rent an apartment once his check arrives this Wednesday.  At discharge, do you have transportation home?: Yes,  bus passes  Do you have the ability to pay for your medications: No.  Release of information consent forms completed and in the chart;  Patient's signature needed at discharge.  Patient to Follow up at: Follow-up Information    [facility] Follow up on 07/06/2018.   Why:  Hospital follow up appointment is Wednesday, 2/26 at 1:45p. Please bring your photo ID, proof of insurance, current medications and discharge paperwork from this hospitalization.  Contact information: [address] [place] [address] [phone]           Next level of care provider has access to [facility] Link:yes  Safety Planning and Suicide Prevention discussed: Yes,  with the patient   Have you used any form of tobacco in the last 30 days? (Cigarettes, Smokeless Tobacco, Cigars, and/or Pipes): Yes  Has patient been referred to the Quitline?: Patient refused referral  Patient has been referred for addiction treatment: Pt. refused referral  Maeola Sarah, LCSWA 07/04/2018, 11:11 AM

## 2018-07-04 NOTE — [facility] Suicide Risk Assessment (Addendum)
[facility] Discharge Suicide Risk Assessment   Principal Problem: MDD (major depressive disorder), severe (HCC) Discharge Diagnoses: Principal Problem:   MDD (major depressive disorder), severe (HCC)   Total Time spent with patient: 30 minutes  Musculoskeletal: Strength & Muscle Tone: within normal limits Gait & Station: normal Patient leans: N/A  Psychiatric Specialty Exam: ROS no headache, no chest pain, no shortness of breath, no vomiting , no fever, no chills   Blood pressure 108/80, pulse 86, temperature 97.6 F (36.4 C), temperature source Oral, resp. rate 16, height 5\' 8"  (1.727 m), weight 94.6 kg, SpO2 99 %.Body mass index is 31.72 kg/m.  General Appearance: improved grooming   Eye Contact::  Good  Speech:  Normal Rate409  Volume:  Normal  Mood:  reports feeling "  a lot better", and describes feeling " back to normal"  Affect:  Appropriate and Full Range  Thought Process:  Linear and Descriptions of Associations: Intact  Orientation:  Full (Time, Place, and Person)  Thought Content:  no hallucinations, no delusions   Suicidal Thoughts:  No denies any suicidal or self injurious ideations, contracts for safety on unit, denies homicidal or violent ideations   Homicidal Thoughts:  No  Memory:  recent and remote grossly intact   Judgement:  Improving   Insight:  Fair/ improving   Psychomotor Activity:  Normal  Concentration:  Good  Recall:  Good  Fund of Knowledge:Good  Language: Good  Akathisia:  Negative  Handed:  Right  AIMS (if indicated):     Assets:  Communication Skills Desire for Improvement Resilience  Sleep:  Number of Hours: 6.75  Cognition: WNL  ADL's:  Intact   Mental Status Per Nursing Assessment::   On Admission:  Suicidal ideation indicated by patient  Demographic Factors:  38, transgender male, no children, in disability, was living with a roommate prior to admission but states plans to move in with another friend at discharge  Loss  Factors: Strained relationship with roommate. Disability  Historical Factors: History of depression and of suicidal ideations , history of prior psychiatric admissions  History of Cocaine Use Disorder   Risk Reduction Factors:   Positive coping skills or problem solving skills  Continued Clinical Symptoms:  At this time patient is alert, attentive, well related, pleasant on approach, mood improved and today presents euthymic, affect appropriate,more reactive, no thought disorder, no suicidal or self injurious ideations, no homicidal or violent ideations, no hallucinations, no delusions, future oriented . No disruptive or agitated behaviors, pleasant on approach. Denies medication side effects.  Cognitive Features That Contribute To Risk:  No gross cognitive deficits noted upon discharge. Is alert , attentive, and oriented x 3    Suicide Risk:  Mild:  Suicidal ideation of limited frequency, intensity, duration, and specificity.  There are no identifiable plans, no associated intent, mild dysphoria and related symptoms, good self-control (both objective and subjective assessment), few other risk factors, and identifiable protective factors, including available and accessible social support.  Follow-up Information    [facility] Follow up on 07/06/2018.   Why:  Hospital follow up appointment is Wednesday, 2/26 at 1:45p. Please bring your photo ID, proof of insurance, current medications and discharge paperwork from this hospitalization.  Contact information: [address] [place] [address] [phone]           Plan Of Care/Follow-up recommendations:  Activity:  as tolerated  Diet:  regular Tests:  NA Other:  See below  Patient is expressing readiness for discharge, and is  leaving unit in good spirits  Plans to go stay with a friend for a period of time until she is able to afford a place to live independently  Plans to follow up as above, and also intends to participate in  [employer] groups   Plans to continue seeing endocrinologist at [facility] , for medication/hormonal management   Craige Cotta, MD 07/04/2018, 10:27 AM

## 2018-08-18 ENCOUNTER — Other Ambulatory Visit: Payer: Self-pay | Admitting: Pharmacist

## 2018-08-18 DIAGNOSIS — B182 Chronic viral hepatitis C: Secondary | ICD-10-CM

## 2018-08-18 NOTE — Progress Notes (Signed)
Hepatitis C viral load for cure.

## 2018-08-22 ENCOUNTER — Other Ambulatory Visit: Payer: Self-pay

## 2018-08-22 ENCOUNTER — Telehealth: Payer: Self-pay | Admitting: Endocrinology

## 2018-08-22 DIAGNOSIS — Z789 Other specified health status: Secondary | ICD-10-CM

## 2018-08-22 DIAGNOSIS — F640 Transsexualism: Secondary | ICD-10-CM

## 2018-08-22 MED ORDER — ESTRADIOL 2 MG PO TABS: 2.0000 mg | ORAL_TABLET | Freq: Three times a day (TID) | ORAL | 0 refills | Status: DC

## 2018-08-22 MED ORDER — SPIRONOLACTONE 50 MG PO TABS: 50.0000 mg | ORAL_TABLET | Freq: Every day | ORAL | 0 refills | Status: DC

## 2018-08-22 NOTE — Telephone Encounter (Signed)
MEDICATION: estradiol (ESTRACE) 2 MG tablet  spironolactone (ALDACTONE) 50 MG tablet  PHARMACY:  [facility]-[id number]  IS THIS A 90 DAY SUPPLY :   IS PATIENT OUT OF MEDICATION:   IF NOT; HOW MUCH IS LEFT:   LAST APPOINTMENT DATE: @2 /03/2019  NEXT APPOINTMENT DATE:@Visit  date not found  DO WE HAVE YOUR PERMISSION TO LEAVE A DETAILED MESSAGE:  OTHER COMMENTS:  Patient stated that Dr.Ellison was supposed to fill them at last appointment and the pharmacy does not have them.  **Let patient know to contact pharmacy at the end of the day to make sure medication is ready. **  ** Please notify patient to allow 48-72 hours to process**  **Encourage patient to contact the pharmacy for refills or they can request refills through [facility]**

## 2018-08-22 NOTE — Telephone Encounter (Signed)
E-Prescribing Status: Receipt confirmed by pharmacy (08/22/2018 11:01 AM EDT)

## 2018-08-23 ENCOUNTER — Ambulatory Visit: Payer: Medicare Other | Admitting: Pharmacist

## 2018-08-23 ENCOUNTER — Other Ambulatory Visit: Payer: Medicare Other

## 2018-08-23 ENCOUNTER — Other Ambulatory Visit: Payer: Self-pay

## 2018-08-23 DIAGNOSIS — B182 Chronic viral hepatitis C: Secondary | ICD-10-CM | POA: Diagnosis not present

## 2018-08-24 ENCOUNTER — Other Ambulatory Visit: Payer: Self-pay | Admitting: Pharmacist

## 2018-08-24 ENCOUNTER — Telehealth: Payer: Self-pay | Admitting: Endocrinology

## 2018-08-24 MED ORDER — FINASTERIDE 5 MG PO TABS: 2.5000 mg | ORAL_TABLET | Freq: Every day | ORAL | 3 refills | Status: DC

## 2018-08-24 NOTE — Telephone Encounter (Signed)
I sent refill 

## 2018-08-24 NOTE — Telephone Encounter (Signed)
Please advise if refill is appropriate 

## 2018-08-24 NOTE — Addendum Note (Signed)
Addended by: Romero Belling on: 08/24/2018 02:50 PM   Modules accepted: Orders

## 2018-08-24 NOTE — Telephone Encounter (Signed)
MEDICATION:  finasteride (PROSCAR) 5 MG tablet  PHARMACY:  [facility]  IS THIS A 90 DAY SUPPLY :   IS PATIENT OUT OF MEDICATION: Yes   IF NOT; HOW MUCH IS LEFT:   LAST APPOINTMENT DATE: @4 /13/2020  NEXT APPOINTMENT DATE:@Visit  date not found  DO WE HAVE YOUR PERMISSION TO LEAVE A DETAILED MESSAGE:  OTHER COMMENTS:    **Let patient know to contact pharmacy at the end of the day to make sure medication is ready. **  ** Please notify patient to allow 48-72 hours to process**  **Encourage patient to contact the pharmacy for refills or they can request refills through [facility]**

## 2018-08-25 ENCOUNTER — Other Ambulatory Visit: Payer: Self-pay

## 2018-08-25 MED ORDER — FINASTERIDE 5 MG PO TABS: 2.5000 mg | ORAL_TABLET | Freq: Every day | ORAL | 3 refills | Status: DC

## 2018-08-25 NOTE — Telephone Encounter (Signed)
Dr. Everardo All originally sent refill as NO PRINT. Resent Rx today. Confirmation received below:  finasteride (PROSCAR) 5 MG tablet 45 tablet 3 08/25/2018    Sig - Route: Take 0.5 tablets (2.5 mg total) by mouth daily. For Prostate health - Oral   Sent to pharmacy as: finasteride (PROSCAR) 5 MG tablet   E-Prescribing Status: Receipt confirmed by pharmacy (08/25/2018 12:19 PM EDT)

## 2018-08-25 NOTE — Telephone Encounter (Signed)
Patient is stating that the pharmacy has not received the refill request. States they can come pick up a print out of it if necessary.  Please Advise with Patient, Thanks

## 2018-08-26 LAB — HEPATITIS C RNA QUANTITATIVE
HCV Quantitative Log: <1.18 Log IU/mL
HCV RNA, PCR, QN: <15 IU/mL

## 2018-08-29 ENCOUNTER — Ambulatory Visit (INDEPENDENT_AMBULATORY_CARE_PROVIDER_SITE_OTHER): Payer: Medicare Other | Admitting: Psychiatry

## 2018-08-29 ENCOUNTER — Other Ambulatory Visit: Payer: Self-pay

## 2018-08-29 DIAGNOSIS — F341 Dysthymic disorder: Secondary | ICD-10-CM | POA: Diagnosis not present

## 2018-08-29 DIAGNOSIS — F1910 Other psychoactive substance abuse, uncomplicated: Secondary | ICD-10-CM | POA: Diagnosis not present

## 2018-08-29 DIAGNOSIS — F640 Transsexualism: Secondary | ICD-10-CM | POA: Diagnosis not present

## 2018-08-29 DIAGNOSIS — Z789 Other specified health status: Secondary | ICD-10-CM

## 2018-08-29 DIAGNOSIS — F4312 Post-traumatic stress disorder, chronic: Secondary | ICD-10-CM | POA: Diagnosis not present

## 2018-08-31 ENCOUNTER — Encounter (HOSPITAL_COMMUNITY): Payer: Self-pay | Admitting: Psychiatry
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
## 2018-09-05 ENCOUNTER — Telehealth (HOSPITAL_COMMUNITY): Payer: Self-pay | Admitting: Licensed Clinical Social Worker

## 2018-09-05 NOTE — Telephone Encounter (Signed)
I called to apologize for not reaching out sooner. Left her my business cell asking for a return call if she is interested in pursuing counseling.

## 2018-09-12 DIAGNOSIS — F419 Anxiety disorder, unspecified: Secondary | ICD-10-CM | POA: Diagnosis not present

## 2018-09-13 ENCOUNTER — Encounter (INDEPENDENT_AMBULATORY_CARE_PROVIDER_SITE_OTHER): Payer: Self-pay

## 2018-09-19 ENCOUNTER — Other Ambulatory Visit: Payer: Self-pay | Admitting: Endocrinology

## 2018-09-19 ENCOUNTER — Telehealth: Payer: Self-pay | Admitting: Endocrinology

## 2018-09-19 DIAGNOSIS — Z789 Other specified health status: Secondary | ICD-10-CM

## 2018-09-19 DIAGNOSIS — F640 Transsexualism: Secondary | ICD-10-CM

## 2018-09-19 NOTE — Telephone Encounter (Signed)
spironolactone (ALDACTONE) 50 MG tablet 30 tablet 0 09/19/2018    Sig - Route: TAKE 1 TABLET (50 MG TOTAL) BY MOUTH DAILY. FOR GENDER TRANSITION - Oral   Sent to pharmacy as: spironolactone (ALDACTONE) 50 MG tablet   Notes to Pharmacy: Maximum Refills Reached   E-Prescribing Status: Receipt confirmed by pharmacy (09/19/2018 8:51 AM EDT)

## 2018-09-19 NOTE — Telephone Encounter (Signed)
MEDICATION: Spironolactone 50 MG  PHARMACY:  [facility] [id number]  IS THIS A 90 DAY SUPPLY :  NO  IS PATIENT OUT OF MEDICATION: no  IF NOT; HOW MUCH IS LEFT: 2 days worth   LAST APPOINTMENT DATE: @5 /03/2019  NEXT APPOINTMENT DATE:@Visit  date not found  DO WE HAVE YOUR PERMISSION TO LEAVE A DETAILED MESSAGE: YES, [phone]  OTHER COMMENTS:    **Let patient know to contact pharmacy at the end of the day to make sure medication is ready. **  ** Please notify patient to allow 48-72 hours to process**  **Encourage patient to contact the pharmacy for refills or they can request refills through [facility]**

## 2018-09-26 DIAGNOSIS — F419 Anxiety disorder, unspecified: Secondary | ICD-10-CM | POA: Diagnosis not present

## 2018-10-06 ENCOUNTER — Other Ambulatory Visit: Payer: Self-pay | Admitting: Endocrinology

## 2018-10-06 DIAGNOSIS — F640 Transsexualism: Secondary | ICD-10-CM

## 2018-10-06 DIAGNOSIS — Z789 Other specified health status: Secondary | ICD-10-CM

## 2018-10-10 DIAGNOSIS — F419 Anxiety disorder, unspecified: Secondary | ICD-10-CM | POA: Diagnosis not present

## 2018-10-10 DIAGNOSIS — Z1159 Encounter for screening for other viral diseases: Secondary | ICD-10-CM | POA: Diagnosis not present

## 2018-10-19 DIAGNOSIS — F419 Anxiety disorder, unspecified: Secondary | ICD-10-CM | POA: Diagnosis not present

## 2018-10-20 ENCOUNTER — Other Ambulatory Visit: Payer: Self-pay

## 2018-10-20 ENCOUNTER — Encounter (HOSPITAL_COMMUNITY): Payer: Self-pay

## 2018-10-20 ENCOUNTER — Ambulatory Visit (HOSPITAL_COMMUNITY)
Admission: EM | Admit: 2018-10-20 | Discharge: 2018-10-20 | Disposition: A | Discharge status: Home / Self Care | Payer: Medicare Other | Attending: Internal Medicine | Admitting: Internal Medicine

## 2018-10-20 ENCOUNTER — Other Ambulatory Visit: Payer: Self-pay | Admitting: Endocrinology

## 2018-10-20 ENCOUNTER — Telehealth: Payer: Self-pay | Admitting: Endocrinology

## 2018-10-20 DIAGNOSIS — H9203 Otalgia, bilateral: Secondary | ICD-10-CM | POA: Diagnosis not present

## 2018-10-20 DIAGNOSIS — Z789 Other specified health status: Secondary | ICD-10-CM

## 2018-10-20 DIAGNOSIS — F640 Transsexualism: Secondary | ICD-10-CM

## 2018-10-20 MED ORDER — FLUTICASONE PROPIONATE 50 MCG/ACT NA SUSP: 1.0000 | Freq: Every day | NASAL | 2 refills | Status: DC

## 2018-10-20 MED ORDER — SPIRONOLACTONE 50 MG PO TABS: 50.0000 mg | ORAL_TABLET | Freq: Every day | ORAL | 1 refills | Status: DC

## 2018-10-20 NOTE — Telephone Encounter (Signed)
This was filled

## 2018-10-20 NOTE — ED Provider Notes (Signed)
[facility]    CSN: [id number] Arrival date & time: 10/20/18  1354      History   Chief Complaint Chief Complaint  Patient presents with  . Otalgia  . Appointment    200    HPI Randy [name] is a 48 y.o. adult comes to urgent care with complaints of pain in both ears of 1 week duration.  Patient recently tested negative for COVID-19.  No nasal stuffiness, postnasal drip, coughing.  No ear discharge.  Patient denies any ringing in the ears.  She has ear fullness bilaterally.  No nausea or vomiting.  HPI  Past Medical History:  Diagnosis Date  . Cellulitis  Of  Left Forearm 01/10/2012   From IVDA  . Depression   . Hepatitis C   . 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 MDD (major depressive disorder), severe (HCC) 06/26/2018  . Male-to-male transgender person 06/21/2018  . Severe recurrent major depression without psychotic features (HCC) 11/16/2017  . Liver fibrosis 06/22/2017  . MDD (major depressive disorder), single episode, severe , no psychosis (HCC) 06/11/2017  . Cough 03/14/2015  . Major depressive disorder, recurrent, severe without psychotic features (HCC)   . Opioid dependence with opioid-induced mood disorder (HCC)   . Cocaine dependence with cocaine-induced mood disorder (HCC)   . MDD (major depressive disorder) 09/17/2014  . MDD (major depressive disorder), recurrent severe, without psychosis (HCC) 07/05/2014  . Major depression, recurrent (HCC) 04/10/2012  . Cocaine dependence (HCC) 04/10/2012  . Polysubstance abuse including IVDA (heroin), cocaine, marijuana 01/10/2012  . Elevated LFTs 01/10/2012  . Chronic hepatitis C without hepatic coma (HCC) 01/10/2012  . 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 Tobacco abuse 01/10/2012  . Anxiety disorder 08/14/2011  . Recurrent major depression-severe (HCC) 08/13/2011    Past Surgical History:  Procedure  Laterality Date  . I&D EXTREMITY  01/13/2012   Procedure: IRRIGATION AND DEBRIDEMENT EXTREMITY;  Surgeon: Kennieth RadArthur F Carter, MD;  Location: [facility];  Service: Orthopedics;  Laterality: Left;       Home Medications    Prior to Admission medications   Medication Sig Start Date End Date Taking? Authorizing Provider  ARIPiprazole (ABILIFY) 5 MG tablet Take 1 tablet (5 mg total) by mouth daily. For mood control 07/04/18   Armandina StammerNwoko, Agnes I, NP  escitalopram (LEXAPRO) 20 MG tablet Take 1 tablet (20 mg total) by mouth daily. For depression 07/04/18   Armandina StammerNwoko, Agnes I, NP  estradiol (ESTRACE) 2 MG tablet TAKE 1 TABLET BY MOUTH THREE TIMES A DAY 10/06/18   Romero BellingEllison, Sean, MD  finasteride (PROSCAR) 5 MG tablet Take 0.5 tablets (2.5 mg total) by mouth daily. For Prostate health 08/25/18   Romero BellingEllison, Sean, MD  fluticasone [facility](FLONASE) 50 MCG/ACT nasal spray Place 1 spray into both nostrils daily. 10/20/18   LampteyBritta Mccreedy, Philip O, MD  hydrOXYzine (ATARAX/VISTARIL) 25 MG tablet Take 1 tablet (25 mg total) by mouth 3 (three) times daily as needed for anxiety. 07/04/18   Armandina StammerNwoko, Agnes I, NP  mirtazapine (REMERON) 15 MG tablet Take 1 tablet (15 mg total) by mouth at bedtime. For depression/insomnia 07/04/18   Armandina StammerNwoko, Agnes I, NP  nicotine (NICODERM CQ - DOSED IN MG/24 HOURS) 21 mg/24hr patch Place 1 patch (21 mg total) onto the skin daily. (May buy from over the counter): For smoking cessation 07/04/18   Armandina StammerNwoko, Agnes I, NP  prazosin (MINIPRESS) 1 MG capsule Take 1  capsule (1 mg total) by mouth at bedtime. For PTSD symptoms 07/04/18   Lindell Spar I, NP  spironolactone (ALDACTONE) 50 MG tablet TAKE 1 TABLET (50 MG TOTAL) BY MOUTH DAILY. FOR GENDER TRANSITION 09/19/18   Renato Shin, MD    Family History Family History  Problem Relation Age of Onset  . Idiopathic pulmonary fibrosis Father   . Endocrine Disorder Neg Hx     Social History Social History   Tobacco Use  . Smoking status: Current Every Day Smoker    Packs/day: 0.50     Years: 20.00    Pack years: 10.00    Types: Cigarettes  . Smokeless tobacco: Never Used  . Tobacco comment: trying to quit-using e-cig some  Substance Use Topics  . Alcohol use: Yes    Comment: occ  . Drug use: Yes    Types: Heroin, "Crack" cocaine, Marijuana    Comment: heroin last used 07/14/14, crack/ had some a few days ago     Allergies   Penicillins   Review of Systems Review of Systems  Constitutional: Negative for activity change, appetite change, fatigue and fever.  HENT: Positive for ear pain. Negative for ear discharge, hearing loss, mouth sores, postnasal drip, rhinorrhea, sinus pressure, sinus pain and sore throat.   Eyes: Negative.   Respiratory: Negative.   Gastrointestinal: Negative.   Genitourinary: Negative.      Physical Exam Triage Vital Signs ED Triage Vitals  Enc Vitals Group     BP 10/20/18 1405 138/86     Pulse Rate 10/20/18 1405 97     Resp 10/20/18 1405 18     Temp 10/20/18 1405 98.3 F (36.8 C)     Temp Source 10/20/18 1405 Oral     SpO2 10/20/18 1405 100 %     Weight 10/20/18 1404 190 lb (86.2 kg)     Height --      Head Circumference --      Peak Flow --      Pain Score 10/20/18 1403 2     Pain Loc --      Pain Edu? --      Excl. in [place]? --    No data found.  Updated Vital Signs BP 138/86 (BP Location: Right Arm)   Pulse 97   Temp 98.3 F (36.8 C) (Oral)   Resp 18   Wt 86.2 kg   SpO2 100%   BMI 28.89 kg/m   Visual Acuity Right Eye Distance:   Left Eye Distance:   Bilateral Distance:    Right Eye Near:   Left Eye Near:    Bilateral Near:     Physical Exam Constitutional:      General: She is not in acute distress.    Appearance: Normal appearance. She is not ill-appearing or toxic-appearing.  HENT:     Right Ear: Tympanic membrane and ear canal normal. There is no impacted cerumen.     Left Ear: Tympanic membrane and ear canal normal. There is no impacted cerumen.     Mouth/Throat:     Mouth: Mucous membranes are  moist.     Pharynx: No oropharyngeal exudate or posterior oropharyngeal erythema.  Eyes:     Conjunctiva/sclera: Conjunctivae normal.  Cardiovascular:     Rate and Rhythm: Normal rate and regular rhythm.     Pulses: Normal pulses.     Heart sounds: Normal heart sounds.  Pulmonary:     Effort: Pulmonary effort is normal.     Breath sounds:  Normal breath sounds.  Skin:    General: Skin is warm.  Neurological:     Mental Status: She is alert.      UC Treatments / Results  Labs (all labs ordered are listed, but only abnormal results are displayed) Labs Reviewed - No data to display  EKG None  Radiology No results found.  Procedures Procedures (including critical care time)  Medications Ordered in UC Medications - No data to display  Initial Impression / Assessment and Plan / UC Course  I have reviewed the triage vital signs and the nursing notes.  Pertinent labs & imaging results that were available during my care of the patient were reviewed by me and considered in my medical decision making (see chart for details).     1.  Otalgia with bilateral ear fullness: No signs of otitis media or effusion Fullness may be related to irritation of the patient's airway from smoking Fluticasone nasal spray for mild nasal congestion Smoke cessation advised  Final Clinical Impressions(s) / UC Diagnoses   Final diagnoses:  Otalgia of both ears   Discharge Instructions   None    ED Prescriptions    Medication Sig Dispense Auth. Provider   fluticasone (FLONASE) 50 MCG/ACT nasal spray Place 1 spray into both nostrils daily. 16 g Merrilee JanskyLamptey, Philip O, MD     Controlled Substance Prescriptions [place] Controlled Substance Registry consulted? No   Merrilee JanskyLamptey, Philip O, MD 10/20/18 1443

## 2018-10-20 NOTE — ED Triage Notes (Signed)
Pt states she has ear pain in both ears x 1 week or more.

## 2018-10-20 NOTE — Telephone Encounter (Signed)
Yes, this is ours. Please refill prn

## 2018-10-20 NOTE — Telephone Encounter (Signed)
MEDICATION: spironolactone (ALDACTONE) 50 MG tablet  PHARMACY:   [place], [place] [phone] (Phone) [phone] (Fax)    IS THIS A 90 DAY SUPPLY : If possible  IS PATIENT OUT OF MEDICATION: Yes  IF NOT; HOW MUCH IS LEFT: 0  LAST APPOINTMENT DATE: @5 /28/2020  NEXT APPOINTMENT DATE:@2 /11/21 not found  DO WE HAVE YOUR PERMISSION TO LEAVE A DETAILED MESSAGE: Yes  OTHER COMMENTS:    **Let patient know to contact pharmacy at the end of the day to make sure medication is ready. **  ** Please notify patient to allow 48-72 hours to process**  **Encourage patient to contact the pharmacy for refills or they can request refills through [facility]**

## 2018-10-20 NOTE — Telephone Encounter (Signed)
Please advise if refill is appropriate or should be sent to PCP

## 2018-10-24 DIAGNOSIS — F419 Anxiety disorder, unspecified: Secondary | ICD-10-CM | POA: Diagnosis not present

## 2018-10-31 DIAGNOSIS — F419 Anxiety disorder, unspecified: Secondary | ICD-10-CM | POA: Diagnosis not present

## 2018-11-08 DIAGNOSIS — F419 Anxiety disorder, unspecified: Secondary | ICD-10-CM | POA: Diagnosis not present

## 2018-11-09 ENCOUNTER — Telehealth: Payer: Self-pay | Admitting: Endocrinology

## 2018-11-09 ENCOUNTER — Other Ambulatory Visit: Payer: Self-pay | Admitting: Endocrinology

## 2018-11-09 DIAGNOSIS — F640 Transsexualism: Secondary | ICD-10-CM

## 2018-11-09 DIAGNOSIS — Z789 Other specified health status: Secondary | ICD-10-CM

## 2018-11-09 NOTE — Telephone Encounter (Signed)
MEDICATION: estradiol (ESTRACE) 2 MG tablet  PHARMACY:   [place], [place] [phone] (Phone) [phone] (Fax)     IS THIS A 90 DAY SUPPLY : no  IS PATIENT OUT OF MEDICATION: No  IF NOT; HOW MUCH IS LEFT: 2 days  LAST APPOINTMENT DATE: @7 /05/2018  NEXT APPOINTMENT DATE:06/22/2019  DO WE HAVE YOUR PERMISSION TO LEAVE A DETAILED MESSAGE: Yes  OTHER COMMENTS:    **Let patient know to contact pharmacy at the end of the day to make sure medication is ready. **  ** Please notify patient to allow 48-72 hours to process**  **Encourage patient to contact the pharmacy for refills or they can request refills through [facility]**

## 2018-11-09 NOTE — Telephone Encounter (Signed)
estradiol (ESTRACE) 2 MG tablet 90 tablet 0 11/09/2018    Sig: TAKE 1 TABLET BY MOUTH THREE TIMES A DAY   Sent to pharmacy as: estradiol (ESTRACE) 2 MG tablet   Notes to Pharmacy: Maximum Refills Reached   E-Prescribing Status: Receipt confirmed by pharmacy (11/09/2018 8:59 AM EDT)    Already sent this AM

## 2018-11-25 DIAGNOSIS — F419 Anxiety disorder, unspecified: Secondary | ICD-10-CM | POA: Diagnosis not present

## 2018-11-28 ENCOUNTER — Telehealth: Payer: Self-pay | Admitting: Endocrinology

## 2018-11-28 ENCOUNTER — Other Ambulatory Visit: Payer: Self-pay

## 2018-11-28 DIAGNOSIS — Z789 Other specified health status: Secondary | ICD-10-CM

## 2018-11-28 DIAGNOSIS — F640 Transsexualism: Secondary | ICD-10-CM

## 2018-11-28 MED ORDER — ESTRADIOL 2 MG PO TABS: 2.0000 mg | ORAL_TABLET | Freq: Three times a day (TID) | ORAL | 7 refills | Status: DC

## 2018-11-28 MED ORDER — SPIRONOLACTONE 50 MG PO TABS: 50.0000 mg | ORAL_TABLET | Freq: Every day | ORAL | 7 refills | Status: DC

## 2018-11-28 MED ORDER — FINASTERIDE 5 MG PO TABS: 2.5000 mg | ORAL_TABLET | Freq: Every day | ORAL | 7 refills | Status: DC

## 2018-11-28 NOTE — Telephone Encounter (Signed)
Please advise if you agree with pt request

## 2018-11-28 NOTE — Telephone Encounter (Signed)
E-Prescribing Status: Receipt confirmed by pharmacy (11/28/2018 10:55 AM EDT)  Per Dr. Cordelia Pen orders, Rx's were sent to pt pharmacy of choice.

## 2018-11-28 NOTE — Telephone Encounter (Signed)
Ok, please refill prn 

## 2018-11-28 NOTE — Telephone Encounter (Signed)
Patient called re: patient sees Dr. Loanne Drilling once per year. Patient is under the impression that the 3 medications listed below would have enough refills to last 1 year until patient's next appointment. Patient states patient has to call to get refills. Please send RX's for the 3 medications listed below with enough refills to get patient through until next appointment on June 22, 2019:  MEDICATION: finasteride (PROSCAR) 5 MG tablet,  estradiol (ESTRACE) 2 MG tablet, AND spironolactone (ALDACTONE) 50 MG tablet  PHARMACY:   [place], [place] [phone] (Fax)    IS THIS A 90 DAY SUPPLY : Patient thinks 60 day supply  IS PATIENT OUT OF MEDICATION: No  IF NOT; HOW MUCH IS LEFT:  ?  LAST APPOINTMENT DATE: @2 /03/2019  NEXT APPOINTMENT DATE:@2 /03/2020  DO WE HAVE YOUR PERMISSION TO LEAVE A DETAILED MESSAGE: Yes  OTHER COMMENTS:    **Let patient know to contact pharmacy at the end of the day to make sure medication is ready. **  ** Please notify patient to allow 48-72 hours to process**  **Encourage patient to contact the pharmacy for refills or they can request refills through [facility]**

## 2018-12-09 DIAGNOSIS — F419 Anxiety disorder, unspecified: Secondary | ICD-10-CM | POA: Diagnosis not present

## 2018-12-20 DIAGNOSIS — F3340 Major depressive disorder, recurrent, in remission, unspecified: Secondary | ICD-10-CM | POA: Diagnosis not present

## 2018-12-20 DIAGNOSIS — F419 Anxiety disorder, unspecified: Secondary | ICD-10-CM | POA: Diagnosis not present

## 2018-12-20 DIAGNOSIS — F4310 Post-traumatic stress disorder, unspecified: Secondary | ICD-10-CM | POA: Diagnosis not present

## 2018-12-20 DIAGNOSIS — F6552 Sexual sadism: Secondary | ICD-10-CM | POA: Diagnosis not present

## 2019-05-25 DIAGNOSIS — F3340 Major depressive disorder, recurrent, in remission, unspecified: Secondary | ICD-10-CM | POA: Diagnosis not present

## 2019-05-25 DIAGNOSIS — F4310 Post-traumatic stress disorder, unspecified: Secondary | ICD-10-CM | POA: Diagnosis not present

## 2019-05-25 DIAGNOSIS — F6552 Sexual sadism: Secondary | ICD-10-CM | POA: Diagnosis not present

## 2019-06-22 ENCOUNTER — Encounter: Payer: Self-pay | Admitting: Endocrinology

## 2019-06-22 ENCOUNTER — Other Ambulatory Visit: Payer: Self-pay

## 2019-06-22 ENCOUNTER — Ambulatory Visit (INDEPENDENT_AMBULATORY_CARE_PROVIDER_SITE_OTHER): Payer: Medicare Other | Admitting: Endocrinology

## 2019-06-22 VITALS — BP 118/70 | HR 104 | Ht 68.0 in | Wt 187.8 lb

## 2019-06-22 DIAGNOSIS — F640 Transsexualism: Secondary | ICD-10-CM | POA: Diagnosis not present

## 2019-06-22 DIAGNOSIS — F339 Major depressive disorder, recurrent, unspecified: Secondary | ICD-10-CM

## 2019-06-22 DIAGNOSIS — Z789 Other specified health status: Secondary | ICD-10-CM

## 2019-06-22 NOTE — Patient Instructions (Addendum)
Blood tests are requested for you today.  We'll let you know about the results.  Please see a surgery specialist.  you will receive a phone call, about a day and time for an appointment.  Please come back for a follow-up appointment in 1 year.

## 2019-06-22 NOTE — Progress Notes (Signed)
Subjective:    Patient ID: Randy [name], adult    DOB: 1970/08/05, 49 y.o.   MRN: [id number]  HPI  Pt returns for f/u of transgender state (M to F): Surgery: never Medication: (E2, finasteride, and aldactone since 2003) Counseling: last visit 2019 Other: She is a smoker.  She has h/o hep C. Interval hx: pt states she feels well in general, but she is disappointed with her clinical progress on hormonal rx.   Past Medical History:  Diagnosis Date  . Cellulitis  Of  Left Forearm 01/10/2012   From IVDA  . Depression   . Hepatitis C   . 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 I & D EXTREMITY  01/13/2012   Procedure: IRRIGATION AND DEBRIDEMENT EXTREMITY;  Surgeon: Sharmon Revere, MD;  Location: [facility];  Service: Orthopedics;  Laterality: Left;    Social History   Socioeconomic History  . Marital status: Single    Spouse name: Not on file  . Number of children: Not on file  . Years of education: Not on file  . Highest education level: Not on file  Occupational History  . 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 Smoking status: Current Every Day Smoker    Packs/day: 0.50    Years: 20.00    Pack years: 10.00    Types: Cigarettes  . Smokeless tobacco: Never Used  . Tobacco comment: trying to quit-using e-cig some  Substance and Sexual Activity  . Alcohol use: Yes    Comment: occ  . Drug use: Yes    Types: Heroin, "Crack" cocaine, Marijuana    Comment: heroin last used 07/14/14, crack/ had some a few days ago  . Sexual activity: Yes    Partners: Male    Birth control/protection: Condom    Comment: Has been HIV tested in past and has been negative.  Other Topics Concern  . Not on file  Social History Narrative   Transgendered male.  Single.  Lives alone.  Independent.   Social Determinants of Health   Financial Resource Strain:   . Difficulty of Paying Living Expenses:  Not on file  Food Insecurity:   . Worried About Charity fundraiser in the Last Year: Not on file  . Ran Out of Food in the Last Year: Not on file  Transportation Needs:   . Lack of Transportation (Medical): Not on file  . Lack of Transportation (Non-Medical): Not on file  Physical Activity:   . Days of Exercise per Week: Not on file  . Minutes of Exercise per Session: Not on file  Stress:   . Feeling of Stress : Not on file  Social Connections:   . Frequency of Communication with Friends and Family: Not on file  . Frequency of Social Gatherings with Friends and Family: Not on file  . Attends Religious Services: Not on file  . Active Member of Clubs or Organizations: Not on file  . Attends Archivist Meetings: Not on file  . Marital Status: Not on file  Intimate Partner Violence:   . Fear of Current or Ex-Partner: Not on file  . Emotionally Abused: Not on file  . Physically Abused: Not on file  . Sexually Abused: Not on file    Current Outpatient Medications on File Prior to Visit  Medication Sig Dispense Refill  . ARIPiprazole (ABILIFY) 5 MG tablet Take 1  tablet (5 mg total) by mouth daily. For mood control 30 tablet 0  . escitalopram (LEXAPRO) 20 MG tablet Take 1 tablet (20 mg total) by mouth daily. For depression 30 tablet 0  . estradiol (ESTRACE) 2 MG tablet Take 1 tablet (2 mg total) by mouth 3 (three) times daily. 90 tablet 7  . finasteride (PROSCAR) 5 MG tablet Take 0.5 tablets (2.5 mg total) by mouth daily. For Prostate health 15 tablet 7  . fluticasone (FLONASE) 50 MCG/ACT nasal spray Place 1 spray into both nostrils daily. 16 g 2  . hydrOXYzine (ATARAX/VISTARIL) 25 MG tablet Take 1 tablet (25 mg total) by mouth 3 (three) times daily as needed for anxiety. 60 tablet 0  . mirtazapine (REMERON) 15 MG tablet Take 1 tablet (15 mg total) by mouth at bedtime. For depression/insomnia 30 tablet 0  . nicotine (NICODERM CQ - DOSED IN MG/24 HOURS) 21 mg/24hr patch Place 1  patch (21 mg total) onto the skin daily. (May buy from over the counter): For smoking cessation 28 patch 0  . prazosin (MINIPRESS) 1 MG capsule Take 1 capsule (1 mg total) by mouth at bedtime. For PTSD symptoms 30 capsule 0  . spironolactone (ALDACTONE) 50 MG tablet Take 1 tablet (50 mg total) by mouth daily. For gender transition 30 tablet 7   No current facility-administered medications on file prior to visit.    Allergies  Allergen Reactions  . Penicillins Other (See Comments)    Has patient had a PCN reaction causing immediate rash, facial/tongue/throat swelling, SOB or lightheadedness with hypotension: no Has patient had a PCN reaction causing severe rash involving mucus membranes or skin necrosis: No Has patient had a PCN reaction that required hospitalization: No Has patient had a PCN reaction occurring within the last 10 years: Yes If all of the above answers are "NO", then may proceed with Cephalosporin use.  Swollen Joints    Family History  Problem Relation Age of Onset  . Idiopathic pulmonary fibrosis Father   . Endocrine Disorder Neg Hx     BP 118/70 (BP Location: Left Arm, Patient Position: Sitting, Cuff Size: Large)   Pulse (!) 104   Ht 5\' 8"  (1.727 m)   Wt 187 lb 12.8 oz (85.2 kg)   SpO2 96%   BMI 28.55 kg/m     Review of Systems Denies sob    Objective:   Physical Exam VITAL SIGNS:  See vs page.  GENERAL: no distress.  Ext: no leg edema.        Assessment & Plan:  Transgender state: clinical progress is not good, per pt.   Patient Instructions  Blood tests are requested for you today.  We'll let you know about the results.  Please see a surgery specialist.  you will receive a phone call, about a day and time for an appointment.  Please come back for a follow-up appointment in 1 year.

## 2019-06-23 LAB — T4, FREE: Free T4: 0.99 ng/dL (ref 0.60–1.60)

## 2019-06-23 LAB — TSH: TSH: 0.94 u[IU]/mL (ref 0.35–4.50)

## 2019-06-24 LAB — TESTOSTERONE,FREE AND TOTAL
Testosterone, Free: 3.4 pg/mL — ABNORMAL LOW (ref 6.8–21.5)
Testosterone: 274 ng/dL (ref 264–916)

## 2019-06-29 LAB — ESTRADIOL, FREE
Estradiol, Free: 0.65 pg/mL — ABNORMAL HIGH
Estradiol: 37 pg/mL — ABNORMAL HIGH

## 2019-07-20 DIAGNOSIS — F6552 Sexual sadism: Secondary | ICD-10-CM | POA: Diagnosis not present

## 2019-07-20 DIAGNOSIS — F3340 Major depressive disorder, recurrent, in remission, unspecified: Secondary | ICD-10-CM | POA: Diagnosis not present

## 2019-07-20 DIAGNOSIS — F4310 Post-traumatic stress disorder, unspecified: Secondary | ICD-10-CM | POA: Diagnosis not present

## 2019-11-08 ENCOUNTER — Other Ambulatory Visit: Payer: Self-pay | Admitting: Endocrinology

## 2019-11-08 DIAGNOSIS — Z789 Other specified health status: Secondary | ICD-10-CM

## 2019-12-08 ENCOUNTER — Other Ambulatory Visit: Payer: Self-pay | Admitting: Endocrinology

## 2019-12-08 DIAGNOSIS — Z789 Other specified health status: Secondary | ICD-10-CM

## 2019-12-08 NOTE — Telephone Encounter (Signed)
Please advise if appropriate to refill 

## 2020-02-09 ENCOUNTER — Other Ambulatory Visit: Payer: Self-pay | Admitting: Endocrinology

## 2020-02-09 DIAGNOSIS — Z789 Other specified health status: Secondary | ICD-10-CM

## 2020-02-22 DIAGNOSIS — F419 Anxiety disorder, unspecified: Secondary | ICD-10-CM | POA: Diagnosis not present

## 2020-03-18 DIAGNOSIS — Z008 Encounter for other general examination: Secondary | ICD-10-CM | POA: Diagnosis not present

## 2020-03-18 DIAGNOSIS — Z880 Allergy status to penicillin: Secondary | ICD-10-CM | POA: Diagnosis not present

## 2020-03-18 DIAGNOSIS — R002 Palpitations: Secondary | ICD-10-CM | POA: Diagnosis not present

## 2020-03-18 DIAGNOSIS — I517 Cardiomegaly: Secondary | ICD-10-CM | POA: Diagnosis not present

## 2020-03-21 DIAGNOSIS — F419 Anxiety disorder, unspecified: Secondary | ICD-10-CM | POA: Diagnosis not present

## 2020-04-23 DIAGNOSIS — F419 Anxiety disorder, unspecified: Secondary | ICD-10-CM | POA: Diagnosis not present

## 2020-05-31 ENCOUNTER — Telehealth: Payer: Self-pay | Admitting: Endocrinology

## 2020-05-31 NOTE — Telephone Encounter (Signed)
Pt called requesting a refill for her Estradiol. Pharmacy received her other medications but not this one.  PHARMACY:  [facility] [address] [place], [place] [address] Phone: [phone]

## 2020-06-03 ENCOUNTER — Other Ambulatory Visit: Payer: Self-pay | Admitting: *Deleted

## 2020-06-03 DIAGNOSIS — Z789 Other specified health status: Secondary | ICD-10-CM

## 2020-06-03 MED ORDER — ESTRADIOL 2 MG PO TABS: 2.0000 mg | ORAL_TABLET | Freq: Three times a day (TID) | ORAL | 2 refills | Status: DC

## 2020-06-03 NOTE — Telephone Encounter (Signed)
Sent Rx to [facility]

## 2020-06-05 ENCOUNTER — Other Ambulatory Visit: Payer: Self-pay | Admitting: *Deleted

## 2020-06-05 DIAGNOSIS — Z789 Other specified health status: Secondary | ICD-10-CM

## 2020-06-05 NOTE — Telephone Encounter (Signed)
LVM---to pt needed to call the office schedule an follow-up appt/refill  with Dr. Everardo All

## 2020-06-20 ENCOUNTER — Ambulatory Visit: Payer: Medicare Other | Admitting: Endocrinology

## 2021-10-26 ENCOUNTER — Ambulatory Visit (HOSPITAL_COMMUNITY)
Admission: RE | Admit: 2021-10-26 | Discharge: 2021-10-26 | Disposition: A | Discharge status: Home / Self Care | Payer: Medicare Other | Attending: Psychiatry | Admitting: Psychiatry

## 2021-10-26 NOTE — H&P (Signed)
Behavioral Health Medical Screening Exam  Randy [name] is a 51 y.o. adult transgender patient presenting to [facility] seeking substance abuse treatment. Patient reports that she recently moved back to [place] about two weeks ago from [place]. Patient states that she is currently homeless and have been sleeping outside. Patient states that she wants to find an extended stay to live in while she searches for an apartment. Patient states that she receives disability income.  Patient is alert oriented x4, calm and cooperative and dos not appear to be responding to any internal or external stimuli at this time.    Patient denies any SI/HI or AVH at  this time.  Total Time spent with patient: 45 minutes  Psychiatric Specialty Exam:  Presentation  General Appearance: Disheveled  Eye Contact:Good  Speech:Clear and Coherent  Speech Volume:Normal  Handedness:Right   Mood and Affect  Mood:Anxious  Affect:Congruent   Thought Process  Thought Processes:Coherent  Descriptions of Associations:Intact  Orientation:Full (Time, Place and Person)  Thought Content:WDL  History of Schizophrenia/Schizoaffective disorder:No data recorded Duration of Psychotic Symptoms:No data recorded Hallucinations:Hallucinations: None  Ideas of Reference:None  Suicidal Thoughts:Suicidal Thoughts: No  Homicidal Thoughts:Homicidal Thoughts: No   Sensorium  Memory:Immediate Fair; Recent Fair; Remote Fair  Judgment:Fair  Insight:Fair   Executive Functions  Concentration:Good  Attention Span:Good  Recall:Good  Fund of Knowledge:Good  Language:Good   Psychomotor Activity  Psychomotor Activity:Psychomotor Activity: Normal   Assets  Assets:Communication Skills; Housing; Physical Health; Resilience   Sleep  Sleep:Sleep: Poor Number of Hours of Sleep: -1    Physical Exam: Physical Exam HENT:     Head: Normocephalic and atraumatic.     Nose: Nose normal.  Eyes:      Pupils: Pupils are equal, round, and reactive to light.  Cardiovascular:     Rate and Rhythm: Normal rate.  Pulmonary:     Effort: Pulmonary effort is normal.  Abdominal:     General: Abdomen is flat.  Musculoskeletal:        General: Normal range of motion.     Cervical back: Normal range of motion.  Skin:    General: Skin is warm.  Neurological:     Mental Status: She is alert and oriented to person, place, and time.  Psychiatric:        Attention and Perception: Attention normal.        Mood and Affect: Mood is anxious.        Speech: Speech normal.        Behavior: Behavior is cooperative.        Thought Content: Thought content normal.        Cognition and Memory: Cognition normal.        Judgment: Judgment normal.   Review of Systems  Constitutional: Negative.   HENT: Negative.    Eyes: Negative.   Respiratory: Negative.    Cardiovascular: Negative.   Gastrointestinal: Negative.   Genitourinary: Negative.   Musculoskeletal: Negative.   Skin: Negative.   Neurological: Negative.  Negative for sensory change.  Endo/Heme/Allergies: Negative.   Psychiatric/Behavioral:  Positive for substance abuse. Negative for hallucinations and suicidal ideas. The patient is nervous/anxious.    There were no vitals taken for this visit. There is no height or weight on file to calculate BMI.  Musculoskeletal: Strength & Muscle Tone: within normal limits Gait & Station: normal Patient leans: N/A   Recommendations:  Based on my evaluation the patient does not appear to have an emergency medical condition. Patient given  community resources   Jasper Riling, NP 10/26/2021, 10:31 PM

## 2021-10-28 ENCOUNTER — Ambulatory Visit (INDEPENDENT_AMBULATORY_CARE_PROVIDER_SITE_OTHER)
Admission: EM | Admit: 2021-10-28 | Discharge: 2021-10-28 | Disposition: A | Discharge status: Home / Self Care | Payer: Medicare Other | Source: Home / Self Care

## 2021-10-28 ENCOUNTER — Encounter (HOSPITAL_COMMUNITY): Payer: Self-pay

## 2021-10-28 ENCOUNTER — Other Ambulatory Visit: Payer: Self-pay

## 2021-10-28 ENCOUNTER — Emergency Department (HOSPITAL_COMMUNITY)
Admission: EM | Admit: 2021-10-28 | Discharge: 2021-10-28 | Disposition: A | Discharge status: Home / Self Care | Payer: Medicare Other | Attending: Emergency Medicine | Admitting: Emergency Medicine

## 2021-10-28 DIAGNOSIS — Z79899 Other long term (current) drug therapy: Secondary | ICD-10-CM | POA: Diagnosis not present

## 2021-10-28 DIAGNOSIS — F419 Anxiety disorder, unspecified: Secondary | ICD-10-CM | POA: Insufficient documentation

## 2021-10-28 DIAGNOSIS — Z59819 Housing instability, housed unspecified: Secondary | ICD-10-CM | POA: Diagnosis not present

## 2021-10-28 DIAGNOSIS — Z733 Stress, not elsewhere classified: Secondary | ICD-10-CM | POA: Diagnosis not present

## 2021-10-28 DIAGNOSIS — F329 Major depressive disorder, single episode, unspecified: Secondary | ICD-10-CM | POA: Diagnosis not present

## 2021-10-28 DIAGNOSIS — Z605 Target of (perceived) adverse discrimination and persecution: Secondary | ICD-10-CM | POA: Insufficient documentation

## 2021-10-28 DIAGNOSIS — F32A Depression, unspecified: Secondary | ICD-10-CM | POA: Diagnosis present

## 2021-10-28 DIAGNOSIS — Z789 Other specified health status: Secondary | ICD-10-CM
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
## 2021-10-28 NOTE — Discharge Instructions (Signed)
Go to the behavioral health center now.  They can see you in person and talk to you about what is going on they may be able to change her medications were recommended a inpatient program for you if they feel you need 1.
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
## 2021-10-28 NOTE — Discharge Instructions (Addendum)
In case of an urgent emergency, you have the option of contacting the Mobile Crisis Unit with [facility] at [phone].   [facility] [address] [place], [place], [place] [address] [phone] office 11am-4pm Mon-Thurs 11am-3pm Friday  CHARITABLE RESIDENTIAL REHABS  [facility] [address] (women only at this campus) [place] [address] [place], [place] [address] [phone]  [address], [place] [address] [phone]  [facility] [phone], [place] [address] [phone], [place] [address] [phone]  RESIDENTIAL TREATMENT- PRIVATE PAY:  [facility] - [place], [place] [phone] Women's Division - [place], [place] [phone]  MEDICAL DETOX/RESIDENTIAL TREATMENT- MEDICAID/IPRS:  [facility] [id number] [place], [place] [address] [phone]  [facility] (detox for men and women) [address] [place], [place] [address] [phone]  MEDICAL DETOX AND RESIDENTIAL TREATMENT - INSURANCE:  [facility] [place], [place], [address] [phone] 24/7 Hotline Takes Medicare and will provide transportation  [facility] (also offers CD-IOP for graduates of residential program) [address] [place], [place] [address] [phone]  [facility] (now accepting [place]) [address] [place], [place] [address] [phone]  [facility] (accept [facility] for some services)  [address] [place], [place] [address] [phone]  OUTPATIENT PROGRAMS:  [facility] ([facility]) [address], [place] [address] [phone]  ((CD-IOP is currently not operational; Opioid replacement clinic is operational))  [facility] (private insurance) [address] [employer] [place], [place] [address] [phone]  ((CD-IOP (afternoon program); individual therapy))  [facility] (Medicaid & IPRS for [facility] residents) [address] [place], [place] [address] [phone]  ((CD-IOP; new clients must go through walk-in clinic; NOT CURRENTLY OPERATIONAL))  [facility] and safety inspector (Medicaid & IPRS for [facility] residents) [address] [facility] [address] [facility] [address] [place], [place] [address] [phone]  ((CD-IOP))  [facility] (Medicaid for [employer] and [employer]; some private insurance) [address] [place], [place] [address] [phone]  ((CD-IOP))  [facility] & private insurance) [phone] [address] [place], [place] [address] [phone]  ((CD-IOP (morning & evening programs); individual therapy))  HALFWAY HOUSES:  [facility] [phone]  [employer].oxfordvacancies.com  12 STEP PROGRAMS:  Alcoholics Anonymous of [place] Software[employer]alet.be  Narcotics Anonymous of [place] [employer]ct.dk  Al-Anon of [place], [place] www.greens[place]alanon.org/find-meetings.html  Nar-Anon https://nar-anon.org/find-a-meetin   Shelters  [facility] [address] [place], [place] [address] [phone] Population served: Adult men & women (6 years old and older, able to perform activities for daily living) Documents required: Valid ID & Social Security Card  [facility] [address] [place], [place]  [address] [phone] Population served: Single women 18+ without dependents Documents required:  Valid ID & Social Security Ship broker of [place] [address] [place], [place] [address] [phone] Population served: Single adults and families with children Documents required: Valid ID & another form of identification  Holiday representative of [employer] [address] [place], [place]  [address] [phone] Population Served: Families with children, adult women, and adult men.   Hot Meals  Please call the churches/agencies/organizations listed below to learn more about when and where hot meals are provided.   Awaken  [facility] [address], [place], [place] [address]  Aurther Loft [address] [place], [place], [place] [address]  [facility] and [facility] ([address], [place], [place] [address] Dinner: Tuesdays & Thursdays 6:00PM - 6:30PM  [facility] [address] [place], [place]  [address] [phone]  [facility] [address] [place], [place] [address] [phone]  [facility]  [address], [place], [place] [address] [phone]   Food Pantries  [facility] [address] [place], [place], [place] [address] [phone] Visit or call between 8:30am-5pm Eligibility: Valid photo ID & Social Engineer, drilling.greensborourbanministry.[facility] Food Pantry [address] [place], [place] [address] [phone] Wed. & Sat. 2-6pm; appointments preferred Eligibility: Valid Photo ID with accurate address  [facility] [address], [place], [place] [address] (Appointments required for food)  [phone] Tues. 11pm-4pm; Sat. 12-2pm For utility assistance: MWF [phone] www.helpinghandshp.[facility] [employer] [address] [place], [place] [address] [phone] Envi[employer]Concern.si Thurs. 2-4pm Picture ID, proof of residency in [facility]  [address], [place], [place] [address] [phone] Mon.-Fri. 1:00-4:30pm All assistance is first come, first served and  requires documentation. Call for info. https://[email or web address].salvationarmy.org/[place]/  [employer] [facility] [place] [address] [place], [place] [address] [phone] Tuesday and Thursday 9am - 12pm Eligibility: No referral needed [email or web address]

## 2021-10-28 NOTE — Progress Notes (Signed)
AVS printed and given to pt. Resources reviewed.

## 2021-10-28 NOTE — BH Assessment (Signed)
LCSW Progress Note  LCSW attempted to talk with pt regarding an available bed at [facility] and informed her that there was no male bed available, but that they did have a male bed open.  LCSW used process comments to make pt aware that LCSW was aware of her being transgender male before presenting the option to her for consideration as this would resolve the substance use and homeless issues at once. Pt denied, became offended, and asked the LCSW leave.  LCSW did so and placed resources in the discharge instructions for her to follow up on.   Per Kelle Darting, NP, this pt does not require psychiatric hospitalization at this time.  Pt is psychiatrically cleared.  Discharge instructions include several resources for substance abuse treatment for outpatient and residential, the [place] [facility] in [place], [place], shelters, hot meals, and food pantries.  EDP Kelle Darting, NP, has been notified.  Hansel Starling, MSW, LCSW [facility] [phone] or [phone]

## 2021-10-28 NOTE — ED Provider Notes (Signed)
[facility]-EMERGENCY DEPT Provider Note   CSN: [id number] Arrival date & time: 10/28/21  2952     History  Chief Complaint  Patient presents with   Psychiatric Evaluation    Randy [name] is a 51 y.o. adult.  51 yo genetically F who identifies as male with a chief complaints of depression.  States that someone close them died recently and has been struggling with that.  Micah Flesher out drinking on Friday and thinks that they were drugs.  Since then they have been quite depressed.  Denies suicidal or homicidal ideation denies hallucinations.  Has things that they are looking forward to coming up in the next week or so.        Home Medications Prior to Admission medications   Medication Sig Start Date End Date Taking? Authorizing Provider  ARIPiprazole (ABILIFY) 5 MG tablet Take 1 tablet (5 mg total) by mouth daily. For mood control 07/04/18   Armandina Stammer I, NP  escitalopram (LEXAPRO) 20 MG tablet Take 1 tablet (20 mg total) by mouth daily. For depression 07/04/18   Armandina Stammer I, NP  estradiol (ESTRACE) 2 MG tablet Take 1 tablet (2 mg total) by mouth 3 (three) times daily. 06/03/20   Romero Belling, MD  finasteride (PROSCAR) 5 MG tablet TAKE 0.5 TABLETS (2.5 MG TOTAL) BY MOUTH DAILY. FOR PROSTATE HEALTH 12/08/19   Romero Belling, MD  fluticasone [facility]) 50 MCG/ACT nasal spray Place 1 spray into both nostrils daily. 10/20/18   LampteyBritta Mccreedy, MD  hydrOXYzine (ATARAX/VISTARIL) 25 MG tablet Take 1 tablet (25 mg total) by mouth 3 (three) times daily as needed for anxiety. 07/04/18   Armandina Stammer I, NP  mirtazapine (REMERON) 15 MG tablet Take 1 tablet (15 mg total) by mouth at bedtime. For depression/insomnia 07/04/18   Armandina Stammer I, NP  nicotine (NICODERM CQ - DOSED IN MG/24 HOURS) 21 mg/24hr patch Place 1 patch (21 mg total) onto the skin daily. (May buy from over the counter): For smoking cessation 07/04/18   Armandina Stammer I, NP  prazosin (MINIPRESS) 1 MG capsule Take  1 capsule (1 mg total) by mouth at bedtime. For PTSD symptoms 07/04/18   Armandina Stammer I, NP  spironolactone (ALDACTONE) 50 MG tablet TAKE 1 TABLET (50 MG TOTAL) BY MOUTH DAILY. FOR GENDER TRANSITION 12/08/19   Romero Belling, MD      Allergies    Penicillins    Review of Systems   Review of Systems  Physical Exam Updated Vital Signs BP (!) 152/104 (BP Location: Left Arm)   Pulse 66   Temp 97.6 F (36.4 C) (Oral)   Resp 18   SpO2 100%  Physical Exam Vitals and nursing note reviewed.  Constitutional:      General: She is not in acute distress.    Appearance: She is well-developed. She is not diaphoretic.  HENT:     Head: Normocephalic and atraumatic.  Eyes:     Pupils: Pupils are equal, round, and reactive to light.  Cardiovascular:     Rate and Rhythm: Normal rate and regular rhythm.     Heart sounds: No murmur heard.    No friction rub. No gallop.  Pulmonary:     Effort: Pulmonary effort is normal.     Breath sounds: No wheezing or rales.  Abdominal:     General: There is no distension.     Palpations: Abdomen is soft.     Tenderness: There is no abdominal tenderness.  Musculoskeletal:  General: No tenderness.     Cervical back: Normal range of motion and neck supple.  Skin:    General: Skin is warm and dry.  Neurological:     Mental Status: She is alert and oriented to person, place, and time.  Psychiatric:        Mood and Affect: Mood is depressed. Affect is tearful.        Behavior: Behavior normal.        Thought Content: Thought content does not include homicidal or suicidal ideation. Thought content does not include homicidal or suicidal plan.     ED Results / Procedures / Treatments   Labs (all labs ordered are listed, but only abnormal results are displayed) Labs Reviewed - No data to display  EKG None  Radiology No results found.  Procedures Procedures    Medications Ordered in ED Medications - No data to display  ED Course/ Medical  Decision Making/ A&P                           Medical Decision Making  51 yo F with a chief complaints of depression.  This is been an ongoing problem since they had a death of a family member but got worse after they went out drinking on Friday.  They felt very depressed since then.  Denies suicidal or homicidal ideation and denies hallucinations.  I feel that he would benefit best by being seen in person at the behavioral health urgent care center.  Given information and will discharge.  They have no medical complaints and I feel they are medically clear for evaluation.  9:01 AM:  I have discussed the diagnosis/risks/treatment options with the patient.  Evaluation and diagnostic testing in the emergency department does not suggest an emergent condition requiring admission or immediate intervention beyond what has been performed at this time.  They will follow up with  PCP. We also discussed returning to the ED immediately if new or worsening sx occur. We discussed the sx which are most concerning (e.g., sudden worsening pain, fever, inability to tolerate by mouth) that necessitate immediate return. Medications administered to the patient during their visit and any new prescriptions provided to the patient are listed below.  Medications given during this visit Medications - No data to display   The patient appears reasonably screen and/or stabilized for discharge and I doubt any other medical condition or other [facility] requiring further screening, evaluation, or treatment in the ED at this time prior to discharge.          Final Clinical Impression(s) / ED Diagnoses Final diagnoses:  Reactive depression    Rx / DC Orders ED Discharge Orders     None         Melene Plan, DO 10/28/21 0901

## 2021-11-22 ENCOUNTER — Emergency Department (HOSPITAL_COMMUNITY)
Admission: EM | Admit: 2021-11-22 | Discharge: 2021-11-22 | Disposition: A | Discharge status: Other Acute Inpatient Hospital | Payer: Medicare Other | Attending: Emergency Medicine | Admitting: Emergency Medicine

## 2021-11-22 ENCOUNTER — Other Ambulatory Visit: Payer: Self-pay

## 2021-11-22 ENCOUNTER — Encounter (HOSPITAL_COMMUNITY): Payer: Self-pay

## 2021-11-22 ENCOUNTER — Ambulatory Visit (INDEPENDENT_AMBULATORY_CARE_PROVIDER_SITE_OTHER)
Admission: EM | Admit: 2021-11-22 | Discharge: 2021-11-23 | Disposition: A | Discharge status: Home / Self Care | Payer: Medicare Other | Source: Home / Self Care

## 2021-11-22 DIAGNOSIS — R45851 Suicidal ideations: Secondary | ICD-10-CM | POA: Insufficient documentation

## 2021-11-22 DIAGNOSIS — Z20822 Contact with and (suspected) exposure to covid-19: Secondary | ICD-10-CM | POA: Diagnosis not present

## 2021-11-22 DIAGNOSIS — F17210 Nicotine dependence, cigarettes, uncomplicated: Secondary | ICD-10-CM | POA: Insufficient documentation

## 2021-11-22 DIAGNOSIS — Z5982 Transportation insecurity: Secondary | ICD-10-CM | POA: Insufficient documentation

## 2021-11-22 DIAGNOSIS — Z5986 Financial insecurity: Secondary | ICD-10-CM | POA: Insufficient documentation

## 2021-11-22 DIAGNOSIS — Z046 Encounter for general psychiatric examination, requested by authority: Secondary | ICD-10-CM | POA: Diagnosis present

## 2021-11-22 DIAGNOSIS — F332 Major depressive disorder, recurrent severe without psychotic features: Secondary | ICD-10-CM | POA: Diagnosis not present

## 2021-11-22 DIAGNOSIS — F322 Major depressive disorder, single episode, severe without psychotic features: Secondary | ICD-10-CM | POA: Insufficient documentation

## 2021-11-22 DIAGNOSIS — Z5941 Food insecurity: Secondary | ICD-10-CM | POA: Insufficient documentation

## 2021-11-22 LAB — CBC
HCT: 44.5 % (ref 39.0–52.0)
Hemoglobin: 14.8 g/dL (ref 13.0–17.0)
MCH: 31.6 pg (ref 26.0–34.0)
MCHC: 33.3 g/dL (ref 30.0–36.0)
MCV: 94.9 fL (ref 80.0–100.0)
Platelets: 178 10*3/uL (ref 150–400)
RBC: 4.69 MIL/uL (ref 4.22–5.81)
RDW: 13.2 % (ref 11.5–15.5)
WBC: 10.8 10*3/uL — ABNORMAL HIGH (ref 4.0–10.5)
nRBC: 0 % (ref 0.0–0.2)

## 2021-11-22 LAB — COMPREHENSIVE METABOLIC PANEL
ALT: 11 U/L (ref 0–44)
AST: 15 U/L (ref 15–41)
Albumin: 4.1 g/dL (ref 3.5–5.0)
Alkaline Phosphatase: 57 U/L (ref 38–126)
Anion gap: 8 (ref 5–15)
BUN: 21 mg/dL — ABNORMAL HIGH (ref 6–20)
CO2: 21 mmol/L — ABNORMAL LOW (ref 22–32)
Calcium: 9 mg/dL (ref 8.9–10.3)
Chloride: 109 mmol/L (ref 98–111)
Creatinine, Ser: 1.02 mg/dL (ref 0.61–1.24)
GFR, Estimated: >60 mL/min (ref >60)
Glucose, Bld: 119 mg/dL — ABNORMAL HIGH (ref 70–99)
Potassium: 3.8 mmol/L (ref 3.5–5.1)
Sodium: 138 mmol/L (ref 135–145)
Total Bilirubin: 0.5 mg/dL (ref 0.3–1.2)
Total Protein: 7.3 g/dL (ref 6.5–8.1)

## 2021-11-22 LAB — RAPID URINE DRUG SCREEN, HOSP PERFORMED
Amphetamines: NOT DETECTED
Barbiturates: NOT DETECTED
Benzodiazepines: NOT DETECTED
Cocaine: NOT DETECTED
Opiates: NOT DETECTED
Tetrahydrocannabinol: NOT DETECTED

## 2021-11-22 LAB — ACETAMINOPHEN LEVEL: Acetaminophen (Tylenol), Serum: <10 ug/mL — ABNORMAL LOW (ref 10–30)

## 2021-11-22 LAB — SALICYLATE LEVEL: Salicylate Lvl: <7 mg/dL — ABNORMAL LOW (ref 7.0–30.0)

## 2021-11-22 LAB — SARS CORONAVIRUS 2 BY RT PCR: SARS Coronavirus 2 by RT PCR: NEGATIVE

## 2021-11-22 LAB — ETHANOL: Alcohol, Ethyl (B): <10 mg/dL (ref <10)

## 2021-11-22 MED ORDER — ALUM & MAG HYDROXIDE-SIMETH 200-200-20 MG/5ML PO SUSP: 30.0000 mL | ORAL | Status: DC | PRN

## 2021-11-22 MED ORDER — MAGNESIUM HYDROXIDE 400 MG/5ML PO SUSP: 30.0000 mL | Freq: Every day | ORAL | Status: DC | PRN

## 2021-11-22 MED ORDER — TRAZODONE HCL 50 MG PO TABS: 50.0000 mg | ORAL_TABLET | Freq: Every evening | ORAL | Status: DC | PRN

## 2021-11-22 MED ORDER — ACETAMINOPHEN 325 MG PO TABS: 650.0000 mg | ORAL_TABLET | Freq: Four times a day (QID) | ORAL | Status: DC | PRN

## 2021-11-22 MED ORDER — ESCITALOPRAM OXALATE 10 MG PO TABS
10.0000 mg | ORAL_TABLET | Freq: Every day | ORAL | Status: DC
  Administered 2021-11-22 – 2021-11-23 (×2): 10 mg via ORAL
  Filled 2021-11-22 (×2): qty 1

## 2021-11-22 MED ORDER — HYDROXYZINE HCL 25 MG PO TABS: 25.0000 mg | ORAL_TABLET | Freq: Three times a day (TID) | ORAL | Status: DC | PRN

## 2021-11-22 NOTE — ED Notes (Signed)
Pt refused vital signs.

## 2021-11-22 NOTE — Consult Note (Signed)
[facility] ED ASSESSMENT   Reason for Consult:  SI  Referring Physician:  Paula Libra, MD Patient Identification: Duval Macleod MRN:  [id number] ED Chief Complaint: <principal problem not specified>  Diagnosis:  Active Problems:   MDD (major depressive disorder), recurrent severe, without psychosis [facility])   ED Assessment Time Calculation: Start Time: 1150 Stop Time: 1220 Total Time in Minutes (Assessment Completion): 30   Subjective:    Randy [name] is a 51 y.o. transgender male who presented to [facility] voluntarily for SI with thoughts of jumping off of a cliff. Patient has a PMHx of MDD, GAD, polysubstance abuse, opioid dependence with opioid induced mood disorder, and PTSD.  HPI: On evaluation today, the patient states that he presented to the ED for SI without a plan. Patient reports having these suicidal thoughts "on and off" his entire life. He does have a hx of a past suicide attempt 19 years ago when he tried to fall off the edge of a cliff. Patient reports that he was inpatient at [facility] 4-5 years ago for polysubstance abuse. Pt reports being sober for 24 days. Denies current use of illicit drugs or alcohol. Denies smoking cigarettes.    Patient states that he recently moved to [facility] and has been here for 1.5 months. He's from [place], [place]. One of his current stressors is finding a place to stay.    Patient reports that he is prescribed lexapro 10 mg daily and does confirm that it has been effective. He said that it was prescribed by his provider in [place] and is looking to find a new provider in this area. Other medications include finasteride, spironolactone, Abilify, and estradiol.    Patient was recently at [facility] from 10/26/2021-10/28/2021 for a chief complaint of depression, homelessness, and substance abuse treatment. Pt has been inpatient at [facility] before, most recently in 2020 when he relapsed on substances.  On evaluation patient is alert and oriented x 4. He is  calm and cooperative. Speech is clear and coherent. Slow to respond at times. Mood is depressed and affect is congruent with mood. Thought process is coherent and thought content is logical. Denies auditory and visual hallucinations. No indication that patient is responding to internal stimuli. No evidence of delusional thought content. Endorses intermittent SI with no specific plan. Patient states that she cannot contract for safety.Denies homicidal ideations. Denies substance abuse.     Past Psychiatric History: MDD. History of several inpatient psychiatric admissions. Pt was recently at [facility] from 10/26/2021-10/28/2021 for a chief complaint of depression, homelessness, and substance abuse treatment. Pt has been inpatient at [facility] before, most recently in 2020 when he relapsed on substances.  Risk to Self or Others: Is the patient at risk to self? Yes Has the patient been a risk to self in the past 6 months? Yes Has the patient been a risk to self within the distant past? Yes Is the patient a risk to others? No Has the patient been a risk to others in the past 6 months? No Has the patient been a risk to others within the distant past? No  [place] Scale:  Flowsheet Row ED from 11/22/2021 in [facility] from 10/28/2021 in [facility] Admission (Discharged) from [facility]  C-SSRS RISK CATEGORY High Risk No Risk Moderate Risk       AIMS:  , , ,  ,   ASAM: ASAM Multidimensional Assessment Summary DImension  1:  Acute Intoxication and/or Withdrawal Potential Severity Rating: Mild Dimension 2:  Biomedical Conditions and Complications Severity Rating: Mild Dimension 3:  Emotional, behavioral or cognitive (EBC) conditions and complications severity rating: Moderate Dimension 4:  Readiness to Change Severity Rating: Mild Dimension 5:  Relapse, continued use, or continued  problem potential severity rating: Severe Dimension 6:  Recovery/living environment severity rating: Moderate  Substance Abuse:     Past Medical History:  Past Medical History:  Diagnosis Date   Cellulitis  Of  Left Forearm 01/10/2012   From IVDA   Depression    Hepatitis C    Hormonal imbalance in transgender patient 01/10/2012    Past Surgical History:  Procedure Laterality Date   I & D EXTREMITY  01/13/2012   Procedure: IRRIGATION AND DEBRIDEMENT EXTREMITY;  Surgeon: Kennieth RadArthur F Carter, MD;  Location: [facility] ORS;  Service: Orthopedics;  Laterality: Left;   Family History:  Family History  Problem Relation Age of Onset   Idiopathic pulmonary fibrosis Father    Endocrine Disorder Neg Hx     Social History:  Social History   Substance and Sexual Activity  Alcohol Use Yes   Comment: occ     Social History   Substance and Sexual Activity  Drug Use Yes   Types: Heroin, "Crack" cocaine, Marijuana   Comment: heroin last used 07/14/14, crack/ had some a few days ago    Social History   Socioeconomic History   Marital status: Single    Spouse name: Not on file   Number of children: Not on file   Years of education: Not on file   Highest education level: Not on file  Occupational History   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  Other Topics Concern   Not on file  Social History  Narrative   Transgendered male.  Single.  Lives alone.  Independent.   Social Determinants of Health   Financial Resource Strain: High Risk (03/13/2018)   Overall Financial Resource Strain (CARDIA)    Difficulty of Paying Living Expenses: Very hard  Food Insecurity: Food Insecurity Present (03/13/2018)   Hunger Vital Sign    Worried About Running Out of Food in the Last Year: Often true    Ran Out of Food in the Last Year: Often true  Transportation Needs: Unmet Transportation Needs (03/13/2018)   PRAPARE - Administrator, Civil ServiceTransportation    Lack of Transportation (Medical): Yes    Lack of Transportation (Non-Medical): Yes  Physical Activity: Unknown (03/13/2018)   Exercise Vital Sign    Days of Exercise per Week: Patient refused    Minutes of Exercise per Session: Patient refused  Stress: Stress Concern Present (03/13/2018)   [employer] - Occupational Stress Questionnaire  Feeling of Stress : Rather much  Social Connections: Unknown (03/13/2018)   Social Connection and Isolation Panel [NHANES]    Frequency of Communication with Friends and Family: Patient refused    Frequency of Social Gatherings with Friends and Family: Patient refused    Attends Religious Services: Patient refused    Database administrator or Organizations: Patient refused    Attends Banker Meetings: Patient refused    Marital Status: Patient refused   Additional Social History:    Allergies:   Allergies  Allergen Reactions   Penicillins Other (See Comments)    Has patient had a PCN reaction causing immediate rash, facial/tongue/throat swelling, SOB or lightheadedness with hypotension: no Has patient had a PCN reaction causing severe rash involving mucus membranes or skin necrosis: No Has patient had a PCN reaction that required hospitalization: No Has patient had a PCN reaction occurring within the last 10 years: Yes If all of the above answers are "NO", then may proceed with  Cephalosporin use.  Swollen Joints    Labs:  Results for orders placed or performed during the hospital encounter of 11/22/21 (from the past 48 hour(s))  Comprehensive metabolic panel     Status: Abnormal   Collection Time: 11/22/21  4:23 AM  Result Value Ref Range   Sodium 138 135 - 145 mmol/L   Potassium 3.8 3.5 - 5.1 mmol/L   Chloride 109 98 - 111 mmol/L   CO2 21 (L) 22 - 32 mmol/L   Glucose, Bld 119 (H) 70 - 99 mg/dL    Comment: Glucose reference range applies only to samples taken after fasting for at least 8 hours.   BUN 21 (H) 6 - 20 mg/dL   Creatinine, Ser 9.35 0.61 - 1.24 mg/dL   Calcium 9.0 8.9 - 70.1 mg/dL   Total Protein 7.3 6.5 - 8.1 g/dL   Albumin 4.1 3.5 - 5.0 g/dL   AST 15 15 - 41 U/L   ALT 11 0 - 44 U/L   Alkaline Phosphatase 57 38 - 126 U/L   Total Bilirubin 0.5 0.3 - 1.2 mg/dL   GFR, Estimated >77 >93 mL/min    Comment: (NOTE) Calculated using the CKD-EPI Creatinine Equation (2021)    Anion gap 8 5 - 15    Comment: Performed at [facility], [address] [place], [place] [address]  Ethanol     Status: None   Collection Time: 11/22/21  4:23 AM  Result Value Ref Range   Alcohol, Ethyl (B) <10 <10 mg/dL    Comment: (NOTE) Lowest detectable limit for serum alcohol is 10 mg/dL.  For medical purposes only. Performed at [facility], [address], [place], [place] [address]   Salicylate level     Status: Abnormal   Collection Time: 11/22/21  4:23 AM  Result Value Ref Range   Salicylate Lvl <7.0 (L) 7.0 - 30.0 mg/dL    Comment: Performed at [facility], [address] [place], [place] [address]  Acetaminophen level     Status: Abnormal   Collection Time: 11/22/21  4:23 AM  Result Value Ref Range   Acetaminophen (Tylenol), Serum <10 (L) 10 - 30 ug/mL    Comment: (NOTE) Therapeutic concentrations vary significantly. A range of 10-30 ug/mL  may be an effective concentration for many patients.  However, some  are best treated at concentrations outside of this range. Acetaminophen concentrations >150 ug/mL at 4 hours after ingestion  and >50 ug/mL  at 12 hours after ingestion are often associated with  toxic reactions.  Performed at [facility], [address], [place], [place] [address]   cbc     Status: Abnormal   Collection Time: 11/22/21  4:23 AM  Result Value Ref Range   WBC 10.8 (H) 4.0 - 10.5 K/uL   RBC 4.69 4.22 - 5.81 MIL/uL   Hemoglobin 14.8 13.0 - 17.0 g/dL   HCT 97.0 26.3 - 78.5 %   MCV 94.9 80.0 - 100.0 fL   MCH 31.6 26.0 - 34.0 pg   MCHC 33.3 30.0 - 36.0 g/dL   RDW 88.5 02.7 - 74.1 %   Platelets 178 150 - 400 K/uL   nRBC 0.0 0.0 - 0.2 %    Comment: Performed at [facility], [address], [place], [place] [address]  Rapid urine drug screen (hospital performed)     Status: None   Collection Time: 11/22/21  4:23 AM  Result Value Ref Range   Opiates NONE DETECTED NONE DETECTED   Cocaine NONE DETECTED NONE DETECTED   Benzodiazepines NONE DETECTED NONE DETECTED   Amphetamines NONE DETECTED NONE DETECTED   Tetrahydrocannabinol NONE DETECTED NONE DETECTED   Barbiturates NONE DETECTED NONE DETECTED    Comment: (NOTE) DRUG SCREEN FOR MEDICAL PURPOSES ONLY.  IF CONFIRMATION IS NEEDED FOR ANY PURPOSE, NOTIFY LAB WITHIN 5 DAYS.  LOWEST DETECTABLE LIMITS FOR URINE DRUG SCREEN Drug Class                     Cutoff (ng/mL) Amphetamine and metabolites    1000 Barbiturate and metabolites    200 Benzodiazepine                 200 Tricyclics and metabolites     300 Opiates and metabolites        300 Cocaine and metabolites        300 THC                            50 Performed at [facility], [address] [place], [place] [address]     No current facility-administered medications for this encounter.   Current Outpatient Medications  Medication Sig Dispense Refill   escitalopram (LEXAPRO) 20 MG  tablet Take 1 tablet (20 mg total) by mouth daily. For depression (Patient taking differently: Take 10 mg by mouth daily. For depression) 30 tablet 0   estradiol (ESTRACE) 2 MG tablet Take 1 tablet (2 mg total) by mouth 3 (three) times daily. 90 tablet 2   finasteride (PROSCAR) 5 MG tablet TAKE 0.5 TABLETS (2.5 MG TOTAL) BY MOUTH DAILY. FOR PROSTATE HEALTH 15 tablet 7   spironolactone (ALDACTONE) 50 MG tablet TAKE 1 TABLET (50 MG TOTAL) BY MOUTH DAILY. FOR GENDER TRANSITION 30 tablet 7   ARIPiprazole (ABILIFY) 5 MG tablet Take 1 tablet (5 mg total) by mouth daily. For mood control (Patient not taking: Reported on 11/22/2021) 30 tablet 0   fluticasone (FLONASE) 50 MCG/ACT nasal spray Place 1 spray into both nostrils daily. (Patient not taking: Reported on 11/22/2021) 16 g 2   hydrOXYzine (ATARAX/VISTARIL) 25 MG tablet Take 1 tablet (25 mg total) by mouth 3 (three) times daily as needed for anxiety. (Patient not taking: Reported on 11/22/2021) 60 tablet 0   mirtazapine (REMERON) 15 MG tablet Take 1 tablet (15 mg total) by mouth at bedtime. For depression/insomnia (Patient not taking:  Reported on 11/22/2021) 30 tablet 0   nicotine (NICODERM CQ - DOSED IN MG/24 HOURS) 21 mg/24hr patch Place 1 patch (21 mg total) onto the skin daily. (May buy from over the counter): For smoking cessation (Patient not taking: Reported on 11/22/2021) 28 patch 0   prazosin (MINIPRESS) 1 MG capsule Take 1 capsule (1 mg total) by mouth at bedtime. For PTSD symptoms (Patient not taking: Reported on 11/22/2021) 30 capsule 0    Musculoskeletal: Strength & Muscle Tone: within normal limits Gait & Station: normal Patient leans: N/A   Psychiatric Specialty Exam: Presentation  General Appearance: Disheveled  Eye Contact:Fair  Speech:Clear and Coherent; Normal Rate  Speech Volume:Normal  Handedness:Right   Mood and Affect  Mood:Anxious; Depressed; Worthless  Affect:Congruent; Constricted   Thought Process  Thought  Processes:Coherent  Descriptions of Associations:Intact  Orientation:Full (Time, Place and Person)  Thought Content:Logical  History of Schizophrenia/Schizoaffective disorder:No data recorded Duration of Psychotic Symptoms:No data recorded Hallucinations:Hallucinations: None  Ideas of Reference:None  Suicidal Thoughts:Suicidal Thoughts: Yes, Passive SI Active Intent and/or Plan: Without Plan  Homicidal Thoughts:Homicidal Thoughts: No   Sensorium  Memory:Immediate Good; Recent Fair  Judgment:Intact  Insight:Shallow   Executive Functions  Concentration:Fair  Attention Span:Fair  Recall:Fair  Fund of Knowledge:Fair  Language:Fair   Psychomotor Activity  Psychomotor Activity:Psychomotor Activity: Normal   Assets  Assets:Desire for Improvement; Physical Health    Sleep  Sleep:Sleep: Fair   Physical Exam: Physical Exam Constitutional:      General: She is not in acute distress.    Appearance: She is not ill-appearing, toxic-appearing or diaphoretic.  HENT:     Right Ear: External ear normal.     Left Ear: External ear normal.  Eyes:     General:        Right eye: No discharge.        Left eye: No discharge.  Cardiovascular:     Rate and Rhythm: Normal rate.  Pulmonary:     Effort: Pulmonary effort is normal. No respiratory distress.  Musculoskeletal:        General: Normal range of motion.     Cervical back: Normal range of motion.  Neurological:     Mental Status: She is alert and oriented to person, place, and time.  Psychiatric:        Mood and Affect: Mood is anxious and depressed.        Behavior: Behavior is cooperative.        Thought Content: Thought content is not paranoid. Thought content includes suicidal ideation. Thought content does not include homicidal ideation. Thought content does not include suicidal plan.    Review of Systems  Constitutional:  Negative for chills, diaphoresis, fever, malaise/fatigue and weight loss.   HENT:  Negative for congestion.   Respiratory:  Negative for cough and shortness of breath.   Cardiovascular:  Negative for chest pain and palpitations.  Gastrointestinal:  Negative for diarrhea, nausea and vomiting.  Neurological:  Negative for dizziness and seizures.  Psychiatric/Behavioral:  Positive for depression and suicidal ideas. Negative for hallucinations and memory loss. The patient is nervous/anxious and has insomnia.   All other systems reviewed and are negative.  Blood pressure (!) 108/59, pulse 70, temperature 97.7 F (36.5 C), temperature source Oral, resp. rate 18, height 5\' 6"  (1.676 m), weight 81.6 kg, SpO2 98 %. Body mass index is 29.05 kg/m.  Medical Decision Making: Patient continues to report SI. Denies specific plan, but states that she cannot contract for safety if discharged  today. Patient provides conflicting information regarding continued alcohol abuse. She reported to this provider that she has been sober for 24 days. She has told her others that she used Friday. BAL <10 on arrival to the ED.    Disposition: Patient does not meet criteria for psychiatric inpatient admission. Supportive therapy provided about ongoing stressors. Patient has been accepted for transfer to [facility] by Vernard Gambles, NP pending negative COVID.  Discussed transfer to [facility] with the patient, including her previous experience at the facility. Patient is in agreement with transfer.   Jackelyn Poling, NP 11/22/2021 1:27 PM

## 2021-11-22 NOTE — ED Triage Notes (Signed)
Pt states that she has been having suicidal thoughts tonight. Pt states that she has been thinking that her mom is dead. Pt has not spoken to her mother in 3 years. Pt's recent plan was to jump off of a cliff.

## 2021-11-22 NOTE — ED Notes (Signed)
Pt assessed lying in bed in adult obs. Pt A&Ox4 with no complaints of pain. Pt reports no SI, AH, or AVH at present. Pt calm and cooperative with no signs of acute distress noted. Will continue to monitor for safety.

## 2021-11-22 NOTE — Progress Notes (Signed)
Patient has been accepted for transfer to [facility] by Vernard Gambles, NP pending negative COVID. Please contact nursing to coordinate transfer. [phone]

## 2021-11-22 NOTE — ED Notes (Signed)
Pt compliant with skin assessment, oriented pt to unit, endorses passive SI but contracts for safety, denies HI. Denies AVH. Given a sandwich and chips for dinner. No pain reported. Gatorade given for BP 97/75.

## 2021-11-22 NOTE — ED Provider Notes (Signed)
[facility]-EMERGENCY DEPT Provider Note   CSN: [id number] Arrival date & time: 11/22/21  0409     History  Chief Complaint  Patient presents with   Suicidal    Randy [name] is a 51 y.o. adult.  The patient is a transgender male without uterus or ovaries  currently on hormone replacement who presents with concern for suicidality that started this evening.  Patient has history of polysubstance abuse, MDD, hepatitis C, and male to male transgender.  Patient states that he is taking estradiol and Lexapro but is otherwise not taking any medications.  States that after a today began to have thoughts about killing herself including plan jumping off of a cliff, without attempt.  No HI or AVH.  No established behavioral health team in [place].  HPI     Home Medications Prior to Admission medications   Medication Sig Start Date End Date Taking? Authorizing Provider  escitalopram (LEXAPRO) 20 MG tablet Take 1 tablet (20 mg total) by mouth daily. For depression Patient taking differently: Take 10 mg by mouth daily. For depression 07/04/18  Yes Nwoko, Nicole Kindred I, NP  estradiol (ESTRACE) 2 MG tablet Take 1 tablet (2 mg total) by mouth 3 (three) times daily. 06/03/20  Yes Romero Belling, MD  finasteride (PROSCAR) 5 MG tablet TAKE 0.5 TABLETS (2.5 MG TOTAL) BY MOUTH DAILY. FOR PROSTATE HEALTH 12/08/19  Yes Romero Belling, MD  spironolactone (ALDACTONE) 50 MG tablet TAKE 1 TABLET (50 MG TOTAL) BY MOUTH DAILY. FOR GENDER TRANSITION 12/08/19  Yes Romero Belling, MD  ARIPiprazole (ABILIFY) 5 MG tablet Take 1 tablet (5 mg total) by mouth daily. For mood control Patient not taking: Reported on 11/22/2021 07/04/18   Armandina Stammer I, NP  fluticasone (FLONASE) 50 MCG/ACT nasal spray Place 1 spray into both nostrils daily. Patient not taking: Reported on 11/22/2021 10/20/18   Merrilee Jansky, MD  hydrOXYzine (ATARAX/VISTARIL) 25 MG tablet Take 1 tablet (25 mg total) by mouth 3 (three)  times daily as needed for anxiety. Patient not taking: Reported on 11/22/2021 07/04/18   Armandina Stammer I, NP  mirtazapine (REMERON) 15 MG tablet Take 1 tablet (15 mg total) by mouth at bedtime. For depression/insomnia Patient not taking: Reported on 11/22/2021 07/04/18   Armandina Stammer I, NP  nicotine (NICODERM CQ - DOSED IN MG/24 HOURS) 21 mg/24hr patch Place 1 patch (21 mg total) onto the skin daily. (May buy from over the counter): For smoking cessation Patient not taking: Reported on 11/22/2021 07/04/18   Armandina Stammer I, NP  prazosin (MINIPRESS) 1 MG capsule Take 1 capsule (1 mg total) by mouth at bedtime. For PTSD symptoms Patient not taking: Reported on 11/22/2021 07/04/18   Armandina Stammer I, NP      Allergies    Penicillins    Review of Systems   Review of Systems  Constitutional: Negative.   HENT: Negative.    Respiratory: Negative.    Cardiovascular: Negative.   Gastrointestinal: Negative.   Genitourinary: Negative.   Musculoskeletal: Negative.   Hematological: Negative.   Psychiatric/Behavioral:  Positive for suicidal ideas. Negative for hallucinations.     Physical Exam Updated Vital Signs BP (!) 92/56   Pulse 78   Temp 97.7 F (36.5 C) (Oral)   Resp 18   Ht 5\' 6"  (1.676 m)   Wt 81.6 kg   SpO2 100%   BMI 29.05 kg/m  Physical Exam Vitals and nursing note reviewed.  Constitutional:      Appearance: She is  not ill-appearing or toxic-appearing.  HENT:     Head: Normocephalic and atraumatic.     Mouth/Throat:     Mouth: Mucous membranes are moist.     Pharynx: No oropharyngeal exudate or posterior oropharyngeal erythema.  Eyes:     General:        Right eye: No discharge.        Left eye: No discharge.     Extraocular Movements: Extraocular movements intact.     Conjunctiva/sclera: Conjunctivae normal.     Pupils: Pupils are equal, round, and reactive to light.  Cardiovascular:     Rate and Rhythm: Normal rate and regular rhythm.     Pulses: Normal pulses.     Heart  sounds: Normal heart sounds. No murmur heard. Pulmonary:     Effort: Pulmonary effort is normal. No respiratory distress.     Breath sounds: Normal breath sounds. No wheezing or rales.  Abdominal:     General: Bowel sounds are normal. There is no distension.     Palpations: Abdomen is soft.     Tenderness: There is no abdominal tenderness. There is no guarding or rebound.  Musculoskeletal:        General: No deformity.     Cervical back: Neck supple.     Right lower leg: No edema.     Left lower leg: No edema.  Skin:    General: Skin is warm and dry.     Capillary Refill: Capillary refill takes less than 2 seconds.  Neurological:     General: No focal deficit present.     Mental Status: She is alert and oriented to person, place, and time. Mental status is at baseline.  Psychiatric:        Mood and Affect: Mood is depressed. Affect is flat.        Speech: Speech is delayed.        Behavior: Behavior is slowed and withdrawn.        Thought Content: Thought content includes suicidal ideation. Thought content does not include homicidal ideation. Thought content includes suicidal plan. Thought content does not include homicidal plan.     Comments: Does not appear to be responding to AVH at this time.      ED Results / Procedures / Treatments   Labs (all labs ordered are listed, but only abnormal results are displayed) Labs Reviewed  COMPREHENSIVE METABOLIC PANEL - Abnormal; Notable for the following components:      Result Value   CO2 21 (*)    Glucose, Bld 119 (*)    BUN 21 (*)    All other components within normal limits  SALICYLATE LEVEL - Abnormal; Notable for the following components:   Salicylate Lvl <7.0 (*)    All other components within normal limits  ACETAMINOPHEN LEVEL - Abnormal; Notable for the following components:   Acetaminophen (Tylenol), Serum <10 (*)    All other components within normal limits  CBC - Abnormal; Notable for the following components:   WBC  10.8 (*)    All other components within normal limits  ETHANOL  RAPID URINE DRUG SCREEN, HOSP PERFORMED    EKG None  Radiology No results found.  Procedures Procedures   Medications Ordered in ED Medications - No data to display  ED Course/ Medical Decision Making/ A&P                           Medical Decision Making 51  year old transgender male who presents with SI.   Vital signs are normal and intake.  Cardiopulmonary abdominal exams are benign.  Patient with withdrawn interactions and speech, very slowed communication.  Does not appear to be responding to internal stimuli.   Amount and/or Complexity of Data Reviewed Labs: ordered.    Details: CBC with mild leukocytosis but no anemia.  CMP unremarkable acetaminophen, salicylate, and alcohol levels are normal.  UDS is normal.     Patient is medically cleared awaiting TTS evaluation at this time.  Silvestre voiced understanding of her medical evaluation and treatment plan. Each of their questions answered to their expressed satisfaction.  This chart was dictated using voice recognition software, Dragon. Despite the best efforts of this provider to proofread and correct errors, errors may still occur which can change documentation meaning.    Final Clinical Impression(s) / ED Diagnoses Final diagnoses:  None    Rx / DC Orders ED Discharge Orders     None         Sherrilee Gilles 11/22/21 0646    Molpus, Jonny Ruiz, MD 11/22/21 [phone]

## 2021-11-22 NOTE — ED Notes (Signed)
Pt just arrived to the unit  

## 2021-11-22 NOTE — ED Provider Notes (Signed)
[name] Continuous Assessment Admission H&P  Date: 11/22/21 Patient Name: Randy [name] MRN: [id number] Chief Complaint: No chief complaint on file.     Diagnoses:  Final diagnoses:  MDD (major depressive disorder), severe (HCC)    HPI: Randy [name] 51 y.o., adult patient who initially presented to [facility] emergency department with increased depression and SI.  He was transferred to the continuous assessment unit for overnight observation.  He will be reevaluated by psychiatry in the a.m.  Randy [name], 51 y.o., adult biological male patient who identifies as a male was seen face to face by this provider, consulted with Dr. Lucianne Muss; and chart reviewed on 11/22/21.  Per chart review patient has a past psychiatric history of MDD, GAD, polysubstance abuse, opioid dependence, and PTSD.  Of note he was recently treated at the [facility] from 10/26/2021-10/28/2021 with a similar presentation.  He was extremely labile and easily triggered during that admission.  He has multiple inpatient psychiatric admissions.  Patient reports he is working on his voucher to get an apartment when asked if he is homeless he becomes agitated, but reluctantly states yes.  Patient reports he moved back to this area roughly 1 month ago.  He does not have any outpatient psychiatric resources in place.  However he reports he takes Lexapro, spironolactone, finasteride, and estradiol.  He denies any recent substance use.  UDS on admission negative and EtOH negative  During evaluation Randy [name] observed laying in bed on the unit.  He is alert/oriented x4 and cooperative.  He is calm and attentive at this time.  He is disheveled, he is dirty with dirt under his nails.  He endorses increased depression and anxiety.  He endorses suicidal ideations.  He denies any specific plan .  When asked if he could contract for safety he is vague and states "I do not know".  He denies HI/AVH.  He does not appear  to be responding to internal/external stimuli.  He is logical and able to answer questions appropriately.  There is no distractibility, preoccupation, paranoia or delusional thought content present.  When asking patient how this facility can best help him he states, "I do not know".  He then proceeds to talk about how he has had no contact with his family and he is worried about his mother. He is vague and resistant in discussing treatment options.  He is requesting to be restarted on Lexapro, spironolactone, finasteride, and estradiol.  Lexapro 10 mg will be ordered.  Last dose of other medications cannot be verified and it appears medications were started in 2021.       PHQ 2-9:  Flowsheet Row Office Visit from 06/21/2017 in [facility] Office Visit from 04/15/2017 in [facility] Office Visit from 12/18/2013 in [facility]  Thoughts that you would be better off dead, or of hurting yourself in some way Not at all Not at all Not at all  PHQ-9 Total Score 7 22 4        Flowsheet Row ED from 11/22/2021 in [facility] from 10/28/2021 in [name]mission (Discharged[facility]  C-SSRS RISK CATEGORY High Risk No Risk Moderate Risk        Total Time spent with patient: 30 minutes  Musculoskeletal  Strength & Muscle Tone:  within normal limits Gait & Station: normal Patient leans: N/A  Psychiatric Specialty Exam  Presentation General Appearance: Disheveled  Eye Contact:Fair  Speech:Clear and Coherent; Normal Rate  Speech Volume:Normal  Handedness:Right   Mood and Affect  Mood:Anxious; Depressed  Affect:Congruent   Thought Process  Thought Processes:Coherent  Descriptions of Associations:Intact  Orientation:Full (Time, Place and  Person)  Thought Content:Logical    Hallucinations:Hallucinations: None  Ideas of Reference:None  Suicidal Thoughts:Suicidal Thoughts: Yes, Passive SI Active Intent and/or Plan: With Intent SI Passive Intent and/or Plan: Without Intent; Without Plan; Without Access to Means  Homicidal Thoughts:Homicidal Thoughts: No   Sensorium  Memory:Immediate Good; Recent Good; Remote Good  Judgment:Fair  Insight:Fair   Executive Functions  Concentration:Good  Attention Span:Good  Recall:Good  Fund of Knowledge:Good  Language:Good   Psychomotor Activity  Psychomotor Activity:Psychomotor Activity: Normal   Assets  Assets:Communication Skills; Desire for Improvement; Physical Health; Resilience; Social Support   Sleep  Sleep:Sleep: Fair   Nutritional Assessment (For OBS and FBC admissions only) Has the patient had a weight loss or gain of 10 pounds or more in the last 3 months?: No Has the patient had a decrease in food intake/or appetite?: No Does the patient have dental problems?: No Does the patient have eating habits or behaviors that may be indicators of an eating disorder including binging or inducing vomiting?: No Has the patient recently lost weight without trying?: 0    Physical Exam Vitals and nursing note reviewed.  Constitutional:      General: She is not in acute distress.    Appearance: Normal appearance. She is not ill-appearing.  HENT:     Head: Normocephalic.  Eyes:     General:        Right eye: No discharge.        Left eye: No discharge.     Conjunctiva/sclera: Conjunctivae normal.     Pupils: Pupils are equal, round, and reactive to light.  Cardiovascular:     Rate and Rhythm: Normal rate.  Pulmonary:     Effort: Pulmonary effort is normal.  Musculoskeletal:        General: Normal range of motion.     Cervical back: Normal range of motion.  Skin:    General: Skin is warm and dry.     Coloration: Skin is not jaundiced or pale.   Neurological:     Mental Status: She is alert and oriented to person, place, and time.  Psychiatric:        Attention and Perception: Attention and perception normal.        Mood and Affect: Affect normal. Mood is anxious and depressed.        Speech: Speech normal.        Behavior: Behavior normal. Behavior is cooperative.        Thought Content: Thought content includes suicidal ideation. Thought content does not include suicidal plan.        Cognition and Memory: Cognition normal.        Judgment: Judgment normal.    Review of Systems  Constitutional: Negative.   HENT: Negative.    Eyes: Negative.   Respiratory: Negative.    Cardiovascular: Negative.   Musculoskeletal: Negative.   Skin: Negative.   Neurological: Negative.   Psychiatric/Behavioral:  Positive for depression and suicidal ideas. The patient is nervous/anxious.     Blood pressure 97/75, pulse 65, temperature 98.3 F (36.8 C), temperature source Oral, resp. rate 19, SpO2 99 %. There is no height  or weight on file to calculate BMI.  Past Psychiatric History: MDD, GAD, polysubstance abuse, opioid dependence with opioid induced mood disorder, and PTSD  Is the patient at risk to self? Yes  Has the patient been a risk to self in the past 6 months? No .    Has the patient been a risk to self within the distant past? No   Is the patient a risk to others? No   Has the patient been a risk to others in the past 6 months? No   Has the patient been a risk to others within the distant past? 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 01/10/2012    Past Surgical History:  Procedure Laterality Date   I & D EXTREMITY  01/13/2012   Procedure: IRRIGATION AND DEBRIDEMENT EXTREMITY;  Surgeon: Kennieth Rad, MD;  Location: [facility] ORS;  Service: Orthopedics;  Laterality: Left;     Family History:  Family History  Problem Relation Age of Onset   Idiopathic pulmonary fibrosis Father    Endocrine Disorder Neg Hx     Social History:  Social History   Socioeconomic History   Marital status: Single    Spouse name: Not on file   Number of children: Not on file   Years of education: Not on file   Highest education level: Not on file  Occupational History   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  Other Topics Concern   Not on file  Social History Narrative   Transgendered male.  Single.  Lives alone.  Independent.   Social Determinants of Health   Financial Resource Strain: High Risk (03/13/2018)   Overall Financial Resource Strain (CARDIA)    Difficulty of Paying Living Expenses: Very hard  Food Insecurity: Food Insecurity Present (03/13/2018)   Hunger Vital Sign    Worried About Running Out of Food in the Last Year: Often true    Ran Out of Food in the Last Year: Often true  Transportation Needs: Unmet Transportation Needs (03/13/2018)   PRAPARE - Administrator, Civil Service (Medical): Yes    Lack of Transportation (Non-Medical): Yes  Physical Activity: Unknown (03/13/2018)   Exercise Vital Sign    Days of Exercise per Week: Patient refused    Minutes of Exercise per Session: Patient refused  Stress: Stress Concern Present (03/13/2018)   [employer] of Occupational Health - Occupational Stress Questionnaire    Feeling of Stress : Rather much  Social Connections: Unknown (03/13/2018)   Social Connection and  Isolation Panel [NHANES]    Frequency of Communication with Friends and Family: Patient refused    Frequency of Social Gatherings with Friends and Family: Patient refused    Attends Religious Services: Patient refused    Active Member of Clubs or Organizations: Patient refused    Attends Banker Meetings: Patient refused    Marital Status: Patient refused  Intimate Partner Violence: Unknown (03/13/2018)   Humiliation, Afraid, Rape, and Kick questionnaire    Fear of Current or Ex-Partner: Patient refused    Emotionally Abused: Patient refused    Physically Abused: Patient refused    Sexually Abused: Patient refused    SDOH:  SDOH Screenings   Alcohol Screen: Low Risk  (07/02/2018)   Alcohol Screen    Last Alcohol Screening Score (AUDIT): 4  Depression (PHQ2-9): Not on file (06/21/2017)  Financial Resource Strain: High Risk (03/13/2018)   Overall Financial Resource Strain (CARDIA)    Difficulty of Paying Living Expenses: Very hard  Food Insecurity: Food Insecurity Present (03/13/2018)   Hunger Vital Sign    Worried About Running Out of Food in the Last Year: Often true    Ran Out of Food in the Last Year: Often true  Housing: Not on file  Physical Activity: Unknown (03/13/2018)   Exercise Vital Sign    Days of Exercise per Week: Patient refused    Minutes of Exercise per Session: Patient refused  Social Connections: Unknown (03/13/2018)   Social Connection and Isolation Panel [NHANES]    Frequency of Communication with Friends and Family: Patient refused    Frequency of Social Gatherings with Friends and Family: Patient refused    Attends Religious Services: Patient refused    Active Member of Clubs or Organizations: Patient refused    Attends Banker Meetings: Patient refused    Marital  Status: Patient refused  Stress: Stress Concern Present (03/13/2018)   [employer] of Occupational Health - Occupational Stress Questionnaire    Feeling of Stress : Rather much  Tobacco Use: High Risk (11/22/2021)   Patient History    Smoking Tobacco Use: Every Day    Smokeless Tobacco Use: Never    Passive Exposure: Not on file  Transportation Needs: Unmet Transportation Needs (03/13/2018)   PRAPARE - Transportation    Lack of Transportation (Medical): Yes    Lack of Transportation (Non-Medical): Yes    Last Labs:  Admission on 11/22/2021, Discharged on 11/22/2021  Component Date Value Ref Range Status   Sodium 11/22/2021 138  135 - 145 mmol/L Final   Potassium 11/22/2021 3.8  3.5 - 5.1 mmol/L Final   Chloride 11/22/2021 109  98 - 111 mmol/L Final   CO2 11/22/2021 21 (L)  22 - 32 mmol/L Final   Glucose, Bld 11/22/2021 119 (H)  70 - 99 mg/dL Final   Glucose reference range applies only to samples taken after fasting for at least 8 hours.   BUN 11/22/2021 21 (H)  6 - 20 mg/dL Final   Creatinine, Ser 11/22/2021 1.02  0.61 - 1.24 mg/dL Final   Calcium 63/87/5643 9.0  8.9 - 10.3 mg/dL Final   Total Protein 32/95/1884 7.3  6.5 - 8.1 g/dL Final   Albumin 16/60/6301 4.1  3.5 - 5.0 g/dL Final   AST 60/02/9322 15  15 - 41 U/L Final   ALT 11/22/2021 11  0 - 44 U/L Final   Alkaline Phosphatase 11/22/2021 57  38 - 126 U/L Final   Total Bilirubin 11/22/2021 0.5  0.3 -  1.2 mg/dL Final   GFR, Estimated 11/22/2021 >60  >60 mL/min Final   Comment: (NOTE) Calculated using the CKD-EPI Creatinine Equation (2021)    Anion gap 11/22/2021 8  5 - 15 Final   Performed at [facility], [address] [place], [place] [address]   Alcohol, Ethyl (B) 11/22/2021 <10  <10 mg/dL Final   Comment: (NOTE) Lowest detectable limit for serum alcohol is 10 mg/dL.  For medical purposes only. Performed at [facility], [address] [place], [place] [address]     Salicylate Lvl 11/22/2021 <7.0 (L)  7.0 - 30.0 mg/dL Final   Performed at [facility], [address] [place], [place] [address]   Acetaminophen (Tylenol), Serum 11/22/2021 <10 (L)  10 - 30 ug/mL Final   Comment: (NOTE) Therapeutic concentrations vary significantly. A range of 10-30 ug/mL  may be an effective concentration for many patients. However, some  are best treated at concentrations outside of this range. Acetaminophen concentrations >150 ug/mL at 4 hours after ingestion  and >50 ug/mL at 12 hours after ingestion are often associated with  toxic reactions.  Performed at [facility], [address] [place], [place] [address]    WBC 11/22/2021 10.8 (H)  4.0 - 10.5 K/uL Final   RBC 11/22/2021 4.69  4.22 - 5.81 MIL/uL Final   Hemoglobin 11/22/2021 14.8  13.0 - 17.0 g/dL Final   HCT 29/56/213007/15/2023 44.5  39.0 - 52.0 % Final   MCV 11/22/2021 94.9  80.0 - 100.0 fL Final   MCH 11/22/2021 31.6  26.0 - 34.0 pg Final   MCHC 11/22/2021 33.3  30.0 - 36.0 g/dL Final   RDW 86/57/846907/15/2023 13.2  11.5 - 15.5 % Final   Platelets 11/22/2021 178  150 - 400 K/uL Final   nRBC 11/22/2021 0.0  0.0 - 0.2 % Final   Performed at [facility], [address] [place], [place] [address]   Opiates 11/22/2021 NONE DETECTED  NONE DETECTED Final   Cocaine 11/22/2021 NONE DETECTED  NONE DETECTED Final   Benzodiazepines 11/22/2021 NONE DETECTED  NONE DETECTED Final   Amphetamines 11/22/2021 NONE DETECTED  NONE DETECTED Final   Tetrahydrocannabinol 11/22/2021 NONE DETECTED  NONE DETECTED Final   Barbiturates 11/22/2021 NONE DETECTED  NONE DETECTED Final   Comment: (NOTE) DRUG SCREEN FOR MEDICAL PURPOSES ONLY.  IF CONFIRMATION IS NEEDED FOR ANY PURPOSE, NOTIFY LAB WITHIN 5 DAYS.  LOWEST DETECTABLE LIMITS FOR URINE DRUG SCREEN Drug Class                     Cutoff (ng/mL) Amphetamine and metabolites    1000 Barbiturate and metabolites     200 Benzodiazepine                 200 Tricyclics and metabolites     300 Opiates and metabolites        300 Cocaine and metabolites        300 THC                            50 Performed at [facility], [address] [place], [place] [address]    SARS Coronavirus 2 by RT PCR 11/22/2021 NEGATIVE  NEGATIVE Final   Comment: (NOTE) SARS-CoV-2 target nucleic acids are NOT DETECTED.  The SARS-CoV-2 RNA is generally detectable in upper and lower respiratory specimens during the acute phase of  infection. The lowest concentration of SARS-CoV-2 viral copies this assay can detect is 250 copies / mL. A negative result does not preclude SARS-CoV-2 infection and should not be used as the sole basis for treatment or other patient management decisions.  A negative result may occur with improper specimen collection / handling, submission of specimen other than nasopharyngeal swab, presence of viral mutation(s) within the areas targeted by this assay, and inadequate number of viral copies (<250 copies / mL). A negative result must be combined with clinical observations, patient history, and epidemiological information.  Fact Sheet for Patients:   RoadLapTop.co.za  Fact Sheet for Healthcare Providers: http://kim-miller.com/  This test is not yet approved or                           cleared by the [place] FDA and has been authorized for detection and/or diagnosis of SARS-CoV-2 by FDA under an Emergency Use Authorization (EUA).  This EUA will remain in effect (meaning this test can be used) for the duration of the COVID-19 declaration under Section 564(b)(1) of the Act, 21 U.S.C. section 360bbb-3(b)(1), unless the authorization is terminated or revoked sooner.  Performed at [facility], [address] [place], [place] [address]     Allergies: Penicillins  PTA Medications: (Not in a hospital  admission)   Medical Decision Making  Patient presents with passive SI and increased depression to the [facility] emergency department.  He was transferred to the [facility] for continuous assessment.  He will be reevaluated by psychiatry in the a.m.  Recommendations  Based on my evaluation the patient does not appear to have an emergency medical condition.  Admit patient to the continuous assessment unit for overnight observation.  He will be reevaluated by psychiatry in the a.m.  Lexapro 10 mg daily ordered for depression.    Ardis Hughs, NP 11/22/21  6:53 PM

## 2021-11-22 NOTE — ED Notes (Signed)
Pt is awake in bed with eyes closed, but moving foot. She is calm and cooperative. No c/o pain or distress. Will continue to monitor for safety

## 2021-11-23 DIAGNOSIS — F322 Major depressive disorder, single episode, severe without psychotic features: Secondary | ICD-10-CM | POA: Diagnosis not present

## 2021-11-23 MED ORDER — ESCITALOPRAM OXALATE 10 MG PO TABS
10.0000 mg | ORAL_TABLET | Freq: Every day | ORAL | Status: DC
Start: 2021-11-24 — End: 2022-11-21

## 2021-11-23 MED ORDER — ARIPIPRAZOLE 5 MG PO TABS
2.5000 mg | ORAL_TABLET | Freq: Every day | ORAL | Status: DC
Start: 2021-11-24 — End: 2022-11-21

## 2021-11-23 MED ORDER — ARIPIPRAZOLE 5 MG PO TABS
2.5000 mg | ORAL_TABLET | Freq: Every day | ORAL | Status: DC
  Administered 2021-11-23: 2.5 mg via ORAL
  Filled 2021-11-23: qty 1

## 2021-11-23 NOTE — ED Notes (Signed)
Pt sleeping@this time. Breathing even and unlabored. Will continue to monitor for safety 

## 2021-11-23 NOTE — ED Notes (Signed)
Received patient this AM. Patient in his bed sleeping. Patient respirations are even and unlabored. Will continue to monitor for safety. 

## 2021-11-23 NOTE — ED Provider Notes (Signed)
FBC/OBS ASAP Discharge Summary  Date and Time: 11/23/2021 3:09 PM  Name: Randy [name]  MRN:  [id number]   Discharge Diagnoses:  Final diagnoses:  MDD (major depressive disorder), severe ([place])    Subjective:  Randy [name] 51 y.o., adult patient who initially presented to [facility] emergency department with increased depression and SI.  He was transferred to the continuous assessment unit for overnight observation. On today's evaluation, patient is requesting to be discharged.   Randy [name], 51 y.o., adult biological male patient who identifies as a male seen face to face by this provider, consulted with Dr. Dwyane Dee; and chart reviewed on 11/23/21.    Per chart review patient has a past psychiatric history of MDD, GAD, polysubstance abuse, opioid dependence, and PTSD.  Of note he was recently treated at the [facility] from 10/26/2021-10/28/2021 with similar presentation.  He was extremely labile and easily triggered during that admission.  He has a history of multiple inpatient psychiatric admissions.  On today's evaluation patient is alert/oriented x4 and cooperative.  He is slightly agitated and states, "I been thinking about the comment you made yesterday when you asked me if I was homeless".  Discussed with patient that housing is an important part of our assessment and it helps in creating a plan of care for the patient.  If it is determined that the patient is homeless then resources can be given.  Patient states he is tired of being on the unit and is upset that his transitioning medications have not been given.  Discussed with him that those medications could not be verified and it appears that they were prescribed in 2021.  He reports that his medications were initiated in [place] he does not have a provider here in place in [place].  He is argumentative and labile on today's assessment.  He reports that he does feel better today after sleeping.  He denies any concerns  with appetite.  He denies SI/HI/AVH.  He contracts for safety.  He denies any plan, intent, or access to means.  Objectively there is no evidence of psychosis/mania or delusional thinking.  Patient is able to converse coherently, goal directed thoughts, no distractibility, or pre-occupation.  Patient refused for any collateral to be obtained.   Stay Summary:   Janssen Puls was admitted to the continuous assessment unit for increased depression,  SI and crisis management.  He was treated with the following medications Lexapro 10 mg daily and Abilify 2.5 mg daily, which were tolerated with no adverse reactions.   Randy [name] was offered prescriptions for medications upon discharge and he refused.  States he has refills of his medications. His emotional and mental status was monitored by staff.  He exhibited no unsafe behaviors while on the unit.           Randy [name] was evaluated for stability and plans for continued recovery upon discharge. The following was addressed as part of his discharge planning and follow up treatment:  Employment, housing, transportation, bed availability, health status, family support, and any pending legal issues were also considered during his during the continuous assessment/observation.  He was offered further treatment options upon discharge including PHP, psychiatric services for therapy and medication management, and resources for shelters and Half-way-houses. Encouraged patient to follow up with the services as listed below under Follow up Information.     Upon completion of this admission the [facility] was both mentally and medically stable  for discharge denying suicidal/homicidal ideation, auditory/visual/tactile hallucinations, delusional thoughts and paranoia.     Total Time spent with patient: 30 minutes  Past Psychiatric History: See H&P Past Medical History:  Past Medical History:  Diagnosis Date   Cellulitis  Of  Left Forearm  01/10/2012   From IVDA   Depression    Hepatitis C    Hormonal imbalance in transgender patient 01/10/2012    Past Surgical History:  Procedure Laterality Date   I & D EXTREMITY  01/13/2012   Procedure: IRRIGATION AND DEBRIDEMENT EXTREMITY;  Surgeon: Sharmon Revere, MD;  Location: [facility] ORS;  Service: Orthopedics;  Laterality: Left;   Family History:  Family History  Problem Relation Age of Onset   Idiopathic pulmonary fibrosis Father    Endocrine Disorder Neg Hx    Family Psychiatric History: See H&P Social History:  Social History   Substance and Sexual Activity  Alcohol Use Yes   Comment: occ     Social History   Substance and Sexual Activity  Drug Use Yes   Types: Heroin, "Crack" cocaine, Marijuana   Comment: heroin last used 07/14/14, crack/ had some a few days ago    Social History   Socioeconomic History   Marital status: Single    Spouse name: Not on file   Number of children: Not on file   Years of education: Not on file   Highest education level: Not on file  Occupational History   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  Other Topics Concern   Not on file  Social History Narrative   Transgendered male.  Single.  Lives alone.  Independent.   Social Determinants of Health   Financial Resource Strain: High Risk (03/13/2018)   Overall Financial Resource Strain (CARDIA)     Difficulty of Paying Living Expenses: Very hard  Food Insecurity: Food Insecurity Present (03/13/2018)   Hunger Vital Sign    Worried About Running Out of Food in the Last Year: Often true    Ran Out of Food in the Last Year: Often true  Transportation Needs: Unmet Transportation Needs (03/13/2018)   PRAPARE - Hydrologist (Medical): Yes    Lack of Transportation (Non-Medical): Yes  Physical Activity: Unknown (03/13/2018)   Exercise Vital Sign    Days of Exercise per Week: Patient refused    Minutes of Exercise per Session: Patient refused  Stress: Stress Concern Present (03/13/2018)   [place]    Feeling of Stress : Rather much  Social Connections: Unknown (03/13/2018)   Social Connection and Isolation Panel [NHANES]    Frequency of Communication with Friends and Family: Patient refused    Frequency of Social  Gatherings with Friends and Family: Patient refused    Attends Religious Services: Patient refused    Active Member of Clubs or Organizations: Patient refused    Attends Banker Meetings: Patient refused    Marital Status: Patient refused   SDOH:  SDOH Screenings   Alcohol Screen: Low Risk  (07/02/2018)   Alcohol Screen    Last Alcohol Screening Score (AUDIT): 4  Depression (PHQ2-9): Not on file (06/21/2017)  Financial Resource Strain: High Risk (03/13/2018)   Overall Financial Resource Strain (CARDIA)    Difficulty of Paying Living Expenses: Very hard  Food Insecurity: Food Insecurity Present (03/13/2018)   Hunger Vital Sign    Worried About Running Out of Food in the Last Year: Often true    Ran Out of Food in the Last Year: Often true  Housing: Not on file  Physical Activity: Unknown (03/13/2018)   Exercise Vital Sign    Days of Exercise per Week: Patient refused    Minutes of Exercise per Session: Patient refused  Social Connections: Unknown (03/13/2018)   Social  Connection and Isolation Panel [NHANES]    Frequency of Communication with Friends and Family: Patient refused    Frequency of Social Gatherings with Friends and Family: Patient refused    Attends Religious Services: Patient refused    Active Member of Clubs or Organizations: Patient refused    Attends Banker Meetings: Patient refused    Marital Status: Patient refused  Stress: Stress Concern Present (03/13/2018)   [employer] of Occupational Health - Occupational Stress Questionnaire    Feeling of Stress : Rather much  Tobacco Use: High Risk (11/22/2021)   Patient History    Smoking Tobacco Use: Every Day    Smokeless Tobacco Use: Never    Passive Exposure: Not on file  Transportation Needs: Unmet Transportation Needs (03/13/2018)   PRAPARE - Administrator, Civil Service (Medical): Yes    Lack of Transportation (Non-Medical): Yes    Tobacco Cessation:  N/A, patient does not currently use tobacco products  Current Medications:  Current Facility-Administered Medications  Medication Dose Route Frequency Provider Last Rate Last Admin   acetaminophen (TYLENOL) tablet 650 mg  650 mg Oral Q6H PRN Ardis Hughs, NP       alum & mag hydroxide-simeth (MAALOX/MYLANTA) 200-200-20 MG/5ML suspension 30 mL  30 mL Oral Q4H PRN Ardis Hughs, NP       ARIPiprazole (ABILIFY) tablet 2.5 mg  2.5 mg Oral Daily Vernard Gambles H, NP   2.5 mg at 11/23/21 1005   escitalopram (LEXAPRO) tablet 10 mg  10 mg Oral Daily Ardis Hughs, NP   10 mg at 11/23/21 1007   hydrOXYzine (ATARAX) tablet 25 mg  25 mg Oral TID PRN Ardis Hughs, NP       magnesium hydroxide (MILK OF MAGNESIA) suspension 30 mL  30 mL Oral Daily PRN Ardis Hughs, NP       traZODone (DESYREL) tablet 50 mg  50 mg Oral QHS PRN Ardis Hughs, NP       Current Outpatient Medications  Medication Sig Dispense Refill   [START ON 11/24/2021] ARIPiprazole (ABILIFY) 5 MG tablet Take 0.5  tablets (2.5 mg total) by mouth daily.     [START ON 11/24/2021] escitalopram (LEXAPRO) 10 MG tablet Take 1 tablet (10 mg total) by mouth daily.     estradiol (ESTRACE) 2 MG tablet Take 1 tablet (2 mg total) by mouth 3 (three) times daily.  90 tablet 2   finasteride (PROSCAR) 5 MG tablet TAKE 0.5 TABLETS (2.5 MG TOTAL) BY MOUTH DAILY. FOR PROSTATE HEALTH 15 tablet 7   fluticasone (FLONASE) 50 MCG/ACT nasal spray Place 1 spray into both nostrils daily. (Patient not taking: Reported on 11/22/2021) 16 g 2   hydrOXYzine (ATARAX/VISTARIL) 25 MG tablet Take 1 tablet (25 mg total) by mouth 3 (three) times daily as needed for anxiety. (Patient not taking: Reported on 11/22/2021) 60 tablet 0   nicotine (NICODERM CQ - DOSED IN MG/24 HOURS) 21 mg/24hr patch Place 1 patch (21 mg total) onto the skin daily. (May buy from over the counter): For smoking cessation (Patient not taking: Reported on 11/22/2021) 28 patch 0   spironolactone (ALDACTONE) 50 MG tablet TAKE 1 TABLET (50 MG TOTAL) BY MOUTH DAILY. FOR GENDER TRANSITION 30 tablet 7    PTA Medications: (Not in a hospital admission)      06/21/2017    3:51 PM 04/15/2017    9:07 AM 03/14/2015    2:15 PM  Depression screen PHQ 2/9  Decreased Interest 1 3 0  Down, Depressed, Hopeless 1 3 1   PHQ - 2 Score 2 6 1   Altered sleeping 1 3   Tired, decreased energy 1 3   Change in appetite 1 3   Feeling bad or failure about yourself  1 3   Trouble concentrating 1 3   Moving slowly or fidgety/restless 0 1   Suicidal thoughts 0 0   PHQ-9 Score 7 22   Difficult doing work/chores Very difficult Extremely dIfficult     Flowsheet Row ED from 11/22/2021 in [facility]PT ED from 10/28/2021 in [facility]PT Admission (Discharged) from OP Visit from 06/26/2018 in [facility]  C-SSRS RISK CATEGORY High Risk No Risk Moderate Risk       Musculoskeletal  Strength & Muscle Tone:  within normal limits Gait & Station: normal Patient leans: N/A  Psychiatric Specialty Exam  Presentation  General Appearance: Disheveled  Eye Contact:Fair  Speech:Normal Rate; Clear and Coherent  Speech Volume:Normal  Handedness:Right   Mood and Affect  Mood:Anxious; Depressed  Affect:Congruent   Thought Process  Thought Processes:Coherent  Descriptions of Associations:Intact  Orientation:Full (Time, Place and Person)  Thought Content:Logical     Hallucinations:Hallucinations: None  Ideas of Reference:None  Suicidal Thoughts:Suicidal Thoughts: No SI Active Intent and/or Plan: With Intent SI Passive Intent and/or Plan: Without Intent; Without Plan; Without Access to Means  Homicidal Thoughts:Homicidal Thoughts: No   Sensorium  Memory:Immediate Good; Recent Good; Remote Good  Judgment:Fair  Insight:Fair   Executive Functions  Concentration:Good  Attention Span:Good  Recall:Good  Fund of Knowledge:Good  Language:Good   Psychomotor Activity  Psychomotor Activity:Psychomotor Activity: Normal   Assets  Assets:Communication Skills; Desire for Improvement; Financial Resources/Insurance; Physical Health; Resilience; Social Support   Sleep  Sleep:Sleep: Good   Nutritional Assessment (For OBS and FBC admissions only) Has the patient had a weight loss or gain of 10 pounds or more in the last 3 months?: No Has the patient had a decrease in food intake/or appetite?: No Does the patient have dental problems?: No Does the patient have eating habits or behaviors that may be indicators of an eating disorder including binging or inducing vomiting?: No Has the patient recently lost weight without trying?: 0    Physical Exam  Physical Exam Vitals and nursing note reviewed.  Constitutional:      General: She is not in acute  distress.    Appearance: Normal appearance. She is not ill-appearing.  HENT:     Head: Normocephalic.  Eyes:     General:         Right eye: No discharge.        Left eye: No discharge.     Conjunctiva/sclera: Conjunctivae normal.     Pupils: Pupils are equal, round, and reactive to light.  Cardiovascular:     Rate and Rhythm: Normal rate.  Pulmonary:     Effort: Pulmonary effort is normal.  Musculoskeletal:        General: Normal range of motion.     Cervical back: Normal range of motion.  Skin:    General: Skin is warm and dry.     Coloration: Skin is not jaundiced or pale.  Neurological:     Mental Status: She is alert and oriented to person, place, and time.  Psychiatric:        Attention and Perception: Attention and perception normal.        Mood and Affect: Mood is anxious and depressed.        Speech: Speech normal.        Behavior: Behavior is agitated. Behavior is cooperative.        Thought Content: Thought content normal.        Cognition and Memory: Cognition normal.        Judgment: Judgment normal.    Review of Systems  Constitutional: Negative.   HENT: Negative.    Eyes: Negative.   Respiratory: Negative.    Cardiovascular: Negative.   Musculoskeletal: Negative.   Skin: Negative.   Neurological: Negative.   Psychiatric/Behavioral:  Positive for depression. The patient is nervous/anxious.    Blood pressure 97/75, pulse 65, temperature 98.3 F (36.8 C), temperature source Oral, resp. rate 19, SpO2 99 %. There is no height or weight on file to calculate BMI.  Demographic Factors:  Male, Cardell Peach, lesbian, or bisexual orientation, Living alone, and Unemployed  Loss Factors: Financial problems/change in socioeconomic status  Historical Factors: Prior suicide attempts  Risk Reduction Factors:   Sense of responsibility to family, Positive therapeutic relationship, and Positive coping skills or problem solving skills  Continued Clinical Symptoms:  Severe Anxiety and/or Agitation Depression:   Impulsivity More than one psychiatric diagnosis Previous Psychiatric Diagnoses and  Treatments  Cognitive Features That Contribute To Risk:  None    Suicide Risk:  Minimal: No identifiable suicidal ideation.  Patients presenting with no risk factors but with morbid ruminations; may be classified as minimal risk based on the severity of the depressive symptoms  Plan Of Care/Follow-up recommendations:  Activity:  as tolerated  Diet:  regular   Disposition:   Discharge patient  Outpatient psychiatric resources provided for medication management and therapy.  PHP declined.  Patient declined prescriptions for Lexapro and Abilify.  Javier I cannot  No evidence of imminent risk to self or others at present.    Patient does not meet criteria for psychiatric inpatient admission. Discussed crisis plan, support from social network, calling 911, coming to the Emergency Department, and calling Suicide Hotline.   Ardis Hughs, NP 11/23/2021, 3:09 PM

## 2021-11-23 NOTE — Discharge Instructions (Addendum)
Please contact one of the following facilities to start medication management and therapy services:   [facility] at [facility] [address]  [place], [place] [address] [phone]   [facility]  [address] [place], [place] [address] [phone]  [facility] [place]  [address] Vella Raring [place], [place] [address] [phone]  [facility]  [address]  [place], [place] [address] [phone]  [facility]  [address] [place], [place] [address] [phone]  [facility]  [address] Renee Rival  [place], [place] [address] [phone]   Therapeutic Alternatives: Mobile Crisis Management 24 hours:  [phone] [place] [facility]) M-F 8am-3pm   [address] [place], [place] [address]   [phone] Services include: laundry, barbering, support groups, case management, phone  & computer access, showers, AA/NA mtgs, mental health/substance abuse nurse, job skills class, disability information, VA assistance, spiritual classes, etc.   HOMELESS SHELTERS  [facility]     [facility]   [address] [place]     [phone]              [employer] (women and children)       [address] [place], [place] [address] [phone] [email or web address] for application and process Application Required  [facility]   [address]    [place] [address]     [phone]                    [facility] [address] [place], [place] [address] [phone] application appt.) Application Required  [facility] (women only)    [address]     [place], [place] [address]     [phone]      Intake starts 6pm daily Need valid ID, SSC, & Police report [employer] [address] [place], [place] [phone] Application Required  [employer] (men only)     [address]      [place], [place]     [phone]       [facility] the [place] (Pregnant women only) [address] [place], [place] [phone]  [facility]      [address]      [place], [place] [address]     [phone]             [facility] [address] [place], [place] [phone] 90 day commitment/SA/Application process  [facility](men only)     [address]     [place], [place]     [phone]       Check-in at [facility] [address] [place], [place] [address] [phone] Men/Women/Women and Children must be there by 7 pm  [facility] [place], [place] [phone]                  Please contact one of the following facilities to start medication management and therapy services:   [facility] at [facility] [address] [place])  [place], [place]  [address] Phone: [phone]  [facility]  [address] [place], [place] [address] Phone:  [phone]  [facility]  [address]  [place], [place] [address]  [facility]  [address]  [place], [place] [address] Phone: [phone]

## 2021-11-25 ENCOUNTER — Encounter (INDEPENDENT_AMBULATORY_CARE_PROVIDER_SITE_OTHER): Payer: Self-pay

## 2022-01-09 ENCOUNTER — Other Ambulatory Visit: Payer: Self-pay | Admitting: Internal Medicine

## 2022-01-09 DIAGNOSIS — Z789 Other specified health status: Secondary | ICD-10-CM

## 2022-05-08 ENCOUNTER — Ambulatory Visit: Payer: Medicare Other | Admitting: Internal Medicine

## 2022-05-08 ENCOUNTER — Telehealth: Payer: Self-pay | Admitting: Internal Medicine

## 2022-05-08 NOTE — Telephone Encounter (Signed)
Please cancel this pt's future appointment with me , as I am not taking on any new transgender patients.   Pt has not been seen by Dr. Everardo All in over 2  years  Thanks

## 2022-05-08 NOTE — Progress Notes (Deleted)
Name: Randy [name]  MRN/ DOB: [id number], [birth year]    Age/ Sex: 51 y.o., adult     PCP: Pcp, No   Reason for Endocrinology Evaluation: Gender dysphoria     Initial Endocrinology Clinic Visit: ***    PATIENT IDENTIFIER: Randy [name] is a 50 y.o., adult with a past medical history of gender dysphoria, chronic hep C, liver cirrhosis, Hx of drug abuse . She has followed with [facility] since *** for consultative assistance with management of her gender dysphoria.   HISTORICAL SUMMARY: The patient was first diagnosed with gender dysphoria decades ago and has been on gender affirming treatment for over 18 years.  Pt returns for f/u of transgender state (M to F): Surgery: never Medication: (E2, finasteride, and aldactone since 2003) Counseling: last visit 2019 Other: She is a smoker.  She has h/o hep C.  SUBJECTIVE:    Today (05/08/2022):  Randy [name] is here for a follow-up on gender dysphoria.  Patient has had multiple ED visits this year due to depression, suicidal ideations  Estradiol 2 mg 3 tabs daily Finasteride 5 mg, half a tablet daily Spironolactone 50 mg daily    HISTORY:  Past Medical History:  Past Medical History:  Diagnosis Date   Cellulitis  Of  Left Forearm 01/10/2012   From IVDA   Depression    Hepatitis C    Hormonal imbalance in transgender patient 01/10/2012   Past Surgical History:  Past Surgical History:  Procedure Laterality Date   I & D EXTREMITY  01/13/2012   Procedure: IRRIGATION AND DEBRIDEMENT EXTREMITY;  Surgeon: Kennieth Rad, MD;  Location: [facility];  Service: Orthopedics;  Laterality: Left;   Social History:  reports that she has been smoking cigarettes. She has a 10.00 pack-year smoking history. She has never used smokeless tobacco. She reports current alcohol use. She reports current drug use. Drugs: Heroin, "Crack" cocaine, and Marijuana. Family History:  Family History  Problem Relation Age of Onset    Idiopathic pulmonary fibrosis Father    Endocrine Disorder Neg Hx      HOME MEDICATIONS: Allergies as of 05/08/2022       Reactions   Penicillins Other (See Comments)   Has patient had a PCN reaction causing immediate rash, facial/tongue/throat swelling, SOB or lightheadedness with hypotension: no Has patient had a PCN reaction causing severe rash involving mucus membranes or skin necrosis: No Has patient had a PCN reaction that required hospitalization: No Has patient had a PCN reaction occurring within the last 10 years: Yes If all of the above answers are "NO", then may proceed with Cephalosporin use. Swollen Joints        Medication List        Accurate as of May 08, 2022  7:17 AM. If you have any questions, ask your nurse or doctor.          ARIPiprazole 5 MG tablet Commonly known as: ABILIFY Take 0.5 tablets (2.5 mg total) by mouth daily.   escitalopram 10 MG tablet Commonly known as: LEXAPRO Take 1 tablet (10 mg total) by mouth daily.   estradiol 2 MG tablet Commonly known as: ESTRACE Take 1 tablet (2 mg total) by mouth 3 (three) times daily.   finasteride 5 MG tablet Commonly known as: PROSCAR TAKE 0.5 TABLETS (2.5 MG TOTAL) BY MOUTH DAILY. FOR PROSTATE HEALTH   fluticasone 50 MCG/ACT nasal spray Commonly known as: FLONASE Place 1 spray into both nostrils daily.   hydrOXYzine  25 MG tablet Commonly known as: ATARAX Take 1 tablet (25 mg total) by mouth 3 (three) times daily as needed for anxiety.   nicotine 21 mg/24hr patch Commonly known as: NICODERM CQ - dosed in mg/24 hours Place 1 patch (21 mg total) onto the skin daily. (May buy from over the counter): For smoking cessation   spironolactone 50 MG tablet Commonly known as: ALDACTONE TAKE 1 TABLET (50 MG TOTAL) BY MOUTH DAILY. FOR GENDER TRANSITION          OBJECTIVE:   PHYSICAL EXAM: VS: There were no vitals taken for this visit.   EXAM: General: Pt appears well and is in NAD   Eyes: External eye exam normal without stare, lid lag or exophthalmos.  EOM intact.    Neck: General: Supple without adenopathy. Thyroid: Thyroid size normal.  No goiter or nodules appreciated. No thyroid bruit.  Lungs: Clear with good BS bilat with no rales, rhonchi, or wheezes  Heart: Auscultation: RRR.  Abdomen: Normoactive bowel sounds, soft, nontender, without masses or organomegaly palpable  Extremities:  BL LE: No pretibial edema normal ROM and strength.  Mental Status: Judgment, insight: Intact Orientation: Oriented to time, place, and person Mood and affect: No depression, anxiety, or agitation     DATA REVIEWED: ***    ASSESSMENT / PLAN / RECOMMENDATIONS:   Gender dysphoria  -Male to male    Medications   ***   Signed electronically by: Lyndle Herrlich, MD  [facility]  [facility] [address] [place], [place] [address] Phone: [phone] FAX: [phone]      CC: Pcp, No No address on file Phone: None  Fax: None   Return to Endocrinology clinic as below: Future Appointments  Date Time Provider Department Center  05/08/2022 10:50 AM Lamika Connolly, Konrad Dolores, MD [id number] None

## 2022-09-18 ENCOUNTER — Ambulatory Visit: Payer: Medicare Other | Admitting: Internal Medicine

## 2022-11-21 ENCOUNTER — Ambulatory Visit (HOSPITAL_COMMUNITY)
Admission: EM | Admit: 2022-11-21 | Discharge: 2022-11-21 | Disposition: A | Discharge status: Home / Self Care | Payer: Medicare Other | Attending: Nurse Practitioner | Admitting: Nurse Practitioner

## 2022-11-21 DIAGNOSIS — F411 Generalized anxiety disorder: Secondary | ICD-10-CM

## 2022-11-21 DIAGNOSIS — F331 Major depressive disorder, recurrent, moderate: Secondary | ICD-10-CM

## 2022-11-21 MED ORDER — ESCITALOPRAM OXALATE 10 MG PO TABS: 20.0000 mg | ORAL_TABLET | Freq: Every day | ORAL | 0 refills | Status: AC

## 2022-11-21 NOTE — Progress Notes (Signed)
   11/21/22 2127  [facility] Triage Screening (Walk-ins at [facility] only)  How Did You Hear About [place]? Self  What Is the Reason for Your Visit/Call Today? Pt presents to [facility] voluntarily. Pt reports not sleeping since thursday afternoon and relapsing on opiates. Pt reports mental confusion and that thughts are being brodcasting. Pt denies SI/HI at this time. Pt denies AVH. Pt reports wanting help with sleeping and he needs to be admitted. Pt reports experiencing stress due to being transgender and would like to find a good psychiatrist and therapist. Pt is routine.  How Long Has This Been Causing You Problems? <Week  Have You Recently Had Any Thoughts About Hurting Yourself? No  Are You Planning to Commit Suicide/Harm Yourself At This time? No  Have you Recently Had Thoughts About Hurting Someone Karolee Ohs? No  Are You Planning To Harm Someone At This Time? No  Are you currently experiencing any auditory, visual or other hallucinations? No  Have You Used Any Alcohol or Drugs in the Past 24 Hours? No  Do you have any current medical co-morbidities that require immediate attention? No  Clinician description of patient physical appearance/behavior: pt is fairly groomed and cooperative. Pt ids tearful and emotional.  What Do You Feel Would Help You the Most Today? Treatment for Depression or other mood problem;Stress Management  If access to [facility] Urgent Care was not available, would you have sought care in the Emergency Department? No  Determination of Need Routine (7 days)  Options For Referral Outpatient Therapy

## 2022-11-21 NOTE — ED Triage Notes (Signed)
Pt presents to [facility] voluntarily. Pt reports not sleeping since thursday afternoon and relapsing on opiates. Pt reports mental confusion and that thughts are being brodcasting. Pt denies SI/HI at this time. Pt denies AVH. Pt reports wanting help with sleeping and he needs to be admitted. Pt reports experiencing stress due to being transgender and would like to find a good psychiatrist and therapist. Pt is routine.

## 2022-11-21 NOTE — Discharge Instructions (Signed)

## 2022-11-21 NOTE — Progress Notes (Signed)
   11/21/22 2127  [facility] Triage Screening (Walk-ins at [facility] only)  How Did You Hear About [place]? Self  What Is the Reason for Your Visit/Call Today? Pt presents to [facility] voluntarily. Pt reports not sleeping since thursday afternoon and relapsing on opiates. Pt reports mental confusion and that thughts are being brodcasting. Pt denies SI/HI at this time. Pt denies AVH. Pt reports wanting help with sleeping and he needs to be admitted. Pt reports experiencing stress due to being transgender and would like to find a good psychiatrist and therapist. Pt is routine.  How Long Has This Been Causing You Problems? <Week  Have You Recently Had Any Thoughts About Hurting Yourself? No  Are You Planning to Commit Suicide/Harm Yourself At This time? No  Have you Recently Had Thoughts About Hurting Someone Karolee Ohs? No  Are You Planning To Harm Someone At This Time? No  Are you currently experiencing any auditory, visual or other hallucinations? No  Have You Used Any Alcohol or Drugs in the Past 24 Hours? No  Do you have any current medical co-morbidities that require immediate attention? No  Clinician description of patient physical appearance/behavior: pt is fairly groomed and cooperative.  What Do You Feel Would Help You the Most Today? Treatment for Depression or other mood problem;Stress Management  If access to [facility] Urgent Care was not available, would you have sought care in the Emergency Department? No  Determination of Need Routine (7 days)  Options For Referral Outpatient Therapy
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
## 2022-12-25 ENCOUNTER — Telehealth: Payer: Self-pay

## 2022-12-25 NOTE — Telephone Encounter (Signed)
No, that is perfect. Thanks!

## 2022-12-25 NOTE — Telephone Encounter (Signed)
Patient called wanting to know if she needs to see ID any longer for HCV follow-up. Discussed that her cure labs in 08/2018 showed HCV viral load was undetectable indicating successful cure. Recommended she follow up with PCP regularly for continued monitoring of liver health.   Sandie Ano, RN

## 2023-04-29 ENCOUNTER — Emergency Department (HOSPITAL_COMMUNITY): Payer: Medicare Other

## 2023-04-29 ENCOUNTER — Emergency Department (HOSPITAL_COMMUNITY)
Admission: EM | Admit: 2023-04-29 | Discharge: 2023-04-29 | Disposition: A | Discharge status: Home / Self Care | Payer: Medicare Other

## 2023-04-29 ENCOUNTER — Encounter (HOSPITAL_COMMUNITY): Payer: Self-pay

## 2023-04-29 ENCOUNTER — Other Ambulatory Visit: Payer: Self-pay

## 2023-04-29 DIAGNOSIS — R110 Nausea: Secondary | ICD-10-CM | POA: Diagnosis not present

## 2023-04-29 DIAGNOSIS — S301XXA Contusion of abdominal wall, initial encounter: Secondary | ICD-10-CM | POA: Insufficient documentation

## 2023-04-29 DIAGNOSIS — R1011 Right upper quadrant pain: Secondary | ICD-10-CM | POA: Diagnosis present

## 2023-04-29 LAB — COMPREHENSIVE METABOLIC PANEL
ALT: 13 U/L (ref 0–44)
AST: 14 U/L — ABNORMAL LOW (ref 15–41)
Albumin: 4.2 g/dL (ref 3.5–5.0)
Alkaline Phosphatase: 74 U/L (ref 38–126)
Anion gap: 6 (ref 5–15)
BUN: 16 mg/dL (ref 6–20)
CO2: 24 mmol/L (ref 22–32)
Calcium: 9.3 mg/dL (ref 8.9–10.3)
Chloride: 107 mmol/L (ref 98–111)
Creatinine, Ser: 0.96 mg/dL (ref 0.61–1.24)
GFR, Estimated: >60 mL/min (ref >60)
Glucose, Bld: 90 mg/dL (ref 70–99)
Potassium: 4.4 mmol/L (ref 3.5–5.1)
Sodium: 137 mmol/L (ref 135–145)
Total Bilirubin: 0.7 mg/dL (ref <1.2)
Total Protein: 8 g/dL (ref 6.5–8.1)

## 2023-04-29 LAB — URINALYSIS, ROUTINE W REFLEX MICROSCOPIC
Bacteria, UA: NONE SEEN
Bilirubin Urine: NEGATIVE
Glucose, UA: NEGATIVE mg/dL
Ketones, ur: NEGATIVE mg/dL
Leukocytes,Ua: NEGATIVE
Nitrite: NEGATIVE
Protein, ur: NEGATIVE mg/dL
Specific Gravity, Urine: 1.019 (ref 1.005–1.030)
pH: 5 (ref 5.0–8.0)

## 2023-04-29 LAB — CBC WITH DIFFERENTIAL/PLATELET
Abs Immature Granulocytes: 0.03 10*3/uL (ref 0.00–0.07)
Basophils Absolute: 0 10*3/uL (ref 0.0–0.1)
Basophils Relative: 0 %
Eosinophils Absolute: 0.2 10*3/uL (ref 0.0–0.5)
Eosinophils Relative: 2 %
HCT: 51.8 % (ref 39.0–52.0)
Hemoglobin: 16.8 g/dL (ref 13.0–17.0)
Immature Granulocytes: 0 %
Lymphocytes Relative: 31 %
Lymphs Abs: 2.9 10*3/uL (ref 0.7–4.0)
MCH: 30.8 pg (ref 26.0–34.0)
MCHC: 32.4 g/dL (ref 30.0–36.0)
MCV: 95 fL (ref 80.0–100.0)
Monocytes Absolute: 0.6 10*3/uL (ref 0.1–1.0)
Monocytes Relative: 7 %
Neutro Abs: 5.4 10*3/uL (ref 1.7–7.7)
Neutrophils Relative %: 60 %
Platelets: 182 10*3/uL (ref 150–400)
RBC: 5.45 MIL/uL (ref 4.22–5.81)
RDW: 13 % (ref 11.5–15.5)
WBC: 9.2 10*3/uL (ref 4.0–10.5)
nRBC: 0 % (ref 0.0–0.2)

## 2023-04-29 LAB — LIPASE, BLOOD: Lipase: 106 U/L — ABNORMAL HIGH (ref 11–51)

## 2023-04-29 MED ORDER — HYDROCODONE-ACETAMINOPHEN 5-325 MG PO TABS
1.0000 | ORAL_TABLET | Freq: Once | ORAL | Status: AC
  Administered 2023-04-29: 1 via ORAL
  Filled 2023-04-29: qty 1

## 2023-04-29 MED ORDER — IOHEXOL 300 MG/ML  SOLN
100.0000 mL | Freq: Once | INTRAMUSCULAR | Status: AC | PRN
  Administered 2023-04-29: 100 mL via INTRAVENOUS

## 2023-04-29 MED ORDER — KETOROLAC TROMETHAMINE 10 MG PO TABS: 10.0000 mg | ORAL_TABLET | Freq: Four times a day (QID) | ORAL | 0 refills | Status: AC | PRN
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
## 2023-04-29 NOTE — Discharge Instructions (Signed)
Your CT scan and blood work were reassuring.  Take the Toradol as needed for pain.  Follow-up with your doctor.  Return to the ER for worsening symptoms.
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
## 2023-08-31 ENCOUNTER — Emergency Department (HOSPITAL_COMMUNITY)
Admission: EM | Admit: 2023-08-31 | Discharge: 2023-08-31 | Disposition: A | Discharge status: Home / Self Care | Attending: Emergency Medicine | Admitting: Emergency Medicine

## 2023-08-31 ENCOUNTER — Encounter (HOSPITAL_COMMUNITY): Payer: Self-pay

## 2023-08-31 ENCOUNTER — Emergency Department (HOSPITAL_COMMUNITY)

## 2023-08-31 ENCOUNTER — Other Ambulatory Visit: Payer: Self-pay

## 2023-08-31 DIAGNOSIS — S93601A Unspecified sprain of right foot, initial encounter: Secondary | ICD-10-CM | POA: Insufficient documentation

## 2023-08-31 DIAGNOSIS — M79671 Pain in right foot: Secondary | ICD-10-CM | POA: Diagnosis present

## 2023-08-31 DIAGNOSIS — W1830XA Fall on same level, unspecified, initial encounter: Secondary | ICD-10-CM | POA: Diagnosis not present

## 2023-08-31 MED ORDER — IBUPROFEN 200 MG PO TABS
600.0000 mg | ORAL_TABLET | Freq: Once | ORAL | Status: AC
  Administered 2023-08-31: 600 mg via ORAL
  Filled 2023-08-31: qty 3

## 2023-08-31 NOTE — Discharge Instructions (Signed)
 If you develop new or worsening pain, inability to walk, numbness or weakness, or any other new/concerning symptoms and return to the ER.

## 2023-08-31 NOTE — ED Triage Notes (Signed)
 Pt arrived with reports of R foot pain. Fall at home last week. No LOC. Endorses swelling to R foot. No other concerns reported
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
